# Patient Record
Sex: Female | Born: 1940
Health system: Southern US, Community
[De-identification: ages and names within clinical notes are randomized; demographics above are authoritative.]

## PROBLEM LIST (undated history)

## (undated) DIAGNOSIS — F419 Anxiety disorder, unspecified: Secondary | ICD-10-CM

## (undated) DIAGNOSIS — IMO0001 Reserved for inherently not codable concepts without codable children: Secondary | ICD-10-CM

## (undated) DIAGNOSIS — J189 Pneumonia, unspecified organism: Secondary | ICD-10-CM

## (undated) DIAGNOSIS — I10 Essential (primary) hypertension: Secondary | ICD-10-CM

## (undated) HISTORY — DX: Essential (primary) hypertension: I10

## (undated) HISTORY — DX: Anxiety disorder, unspecified: F41.9

## (undated) HISTORY — PX: TUBAL LIGATION: SHX77

---

## 1998-07-08 ENCOUNTER — Ambulatory Visit (HOSPITAL_COMMUNITY): Admission: RE | Admit: 1998-07-08 | Discharge: 1998-07-08 | Payer: Self-pay | Admitting: Internal Medicine

## 1998-07-08 ENCOUNTER — Encounter: Payer: Self-pay | Admitting: Internal Medicine

## 1998-11-23 ENCOUNTER — Other Ambulatory Visit: Admission: RE | Admit: 1998-11-23 | Discharge: 1998-11-23 | Payer: Self-pay | Admitting: Internal Medicine

## 2000-05-27 ENCOUNTER — Emergency Department (HOSPITAL_COMMUNITY): Admission: EM | Admit: 2000-05-27 | Discharge: 2000-05-27 | Payer: Self-pay | Admitting: Emergency Medicine

## 2000-06-10 ENCOUNTER — Emergency Department (HOSPITAL_COMMUNITY): Admission: EM | Admit: 2000-06-10 | Discharge: 2000-06-10 | Payer: Self-pay | Admitting: Emergency Medicine

## 2000-06-10 ENCOUNTER — Encounter: Payer: Self-pay | Admitting: Emergency Medicine

## 2001-01-16 ENCOUNTER — Encounter: Admission: RE | Admit: 2001-01-16 | Discharge: 2001-01-16 | Payer: Self-pay | Admitting: Family Medicine

## 2001-01-16 ENCOUNTER — Encounter: Payer: Self-pay | Admitting: Family Medicine

## 2001-01-20 ENCOUNTER — Other Ambulatory Visit: Admission: RE | Admit: 2001-01-20 | Discharge: 2001-01-20 | Payer: Self-pay | Admitting: Family Medicine

## 2001-05-07 ENCOUNTER — Encounter: Payer: Self-pay | Admitting: Family Medicine

## 2001-05-07 ENCOUNTER — Encounter: Admission: RE | Admit: 2001-05-07 | Discharge: 2001-05-07 | Payer: Self-pay | Admitting: *Deleted

## 2001-07-16 ENCOUNTER — Encounter: Admission: RE | Admit: 2001-07-16 | Discharge: 2001-07-16 | Payer: Self-pay | Admitting: Family Medicine

## 2001-07-16 ENCOUNTER — Encounter: Payer: Self-pay | Admitting: Family Medicine

## 2001-08-10 ENCOUNTER — Emergency Department (HOSPITAL_COMMUNITY): Admission: EM | Admit: 2001-08-10 | Discharge: 2001-08-10 | Payer: Self-pay

## 2003-01-20 ENCOUNTER — Other Ambulatory Visit: Admission: RE | Admit: 2003-01-20 | Discharge: 2003-01-20 | Payer: Self-pay | Admitting: Obstetrics and Gynecology

## 2005-02-07 ENCOUNTER — Ambulatory Visit: Payer: Self-pay | Admitting: Internal Medicine

## 2005-02-20 ENCOUNTER — Ambulatory Visit: Payer: Self-pay

## 2005-05-28 ENCOUNTER — Ambulatory Visit: Payer: Self-pay | Admitting: Internal Medicine

## 2005-09-14 ENCOUNTER — Ambulatory Visit: Payer: Self-pay | Admitting: Internal Medicine

## 2005-12-18 ENCOUNTER — Ambulatory Visit: Payer: Self-pay | Admitting: Internal Medicine

## 2006-03-05 ENCOUNTER — Ambulatory Visit: Payer: Self-pay | Admitting: Internal Medicine

## 2006-03-25 ENCOUNTER — Ambulatory Visit: Payer: Self-pay | Admitting: Internal Medicine

## 2006-04-03 ENCOUNTER — Ambulatory Visit: Payer: Self-pay | Admitting: Cardiology

## 2006-05-01 ENCOUNTER — Ambulatory Visit: Payer: Self-pay | Admitting: Internal Medicine

## 2006-05-15 ENCOUNTER — Ambulatory Visit: Payer: Self-pay | Admitting: Internal Medicine

## 2006-05-22 ENCOUNTER — Ambulatory Visit (HOSPITAL_COMMUNITY): Admission: RE | Admit: 2006-05-22 | Discharge: 2006-05-22 | Payer: Self-pay | Admitting: Internal Medicine

## 2006-05-29 ENCOUNTER — Ambulatory Visit: Payer: Self-pay | Admitting: Internal Medicine

## 2006-07-08 ENCOUNTER — Ambulatory Visit: Payer: Self-pay | Admitting: Internal Medicine

## 2006-09-16 ENCOUNTER — Ambulatory Visit: Payer: Self-pay | Admitting: Internal Medicine

## 2006-09-18 ENCOUNTER — Ambulatory Visit: Payer: Self-pay | Admitting: Internal Medicine

## 2006-10-02 ENCOUNTER — Ambulatory Visit: Payer: Self-pay | Admitting: Cardiology

## 2006-11-27 ENCOUNTER — Ambulatory Visit: Payer: Self-pay | Admitting: Internal Medicine

## 2006-11-27 DIAGNOSIS — J45909 Unspecified asthma, uncomplicated: Secondary | ICD-10-CM

## 2007-01-30 ENCOUNTER — Ambulatory Visit: Payer: Self-pay | Admitting: Internal Medicine

## 2007-01-30 DIAGNOSIS — F3289 Other specified depressive episodes: Secondary | ICD-10-CM | POA: Insufficient documentation

## 2007-01-30 DIAGNOSIS — F329 Major depressive disorder, single episode, unspecified: Secondary | ICD-10-CM

## 2007-02-04 ENCOUNTER — Encounter: Payer: Self-pay | Admitting: Internal Medicine

## 2007-02-04 DIAGNOSIS — F411 Generalized anxiety disorder: Secondary | ICD-10-CM

## 2007-02-04 DIAGNOSIS — I10 Essential (primary) hypertension: Secondary | ICD-10-CM

## 2007-03-13 ENCOUNTER — Encounter: Admission: RE | Admit: 2007-03-13 | Discharge: 2007-03-13 | Payer: Self-pay | Admitting: Obstetrics and Gynecology

## 2007-05-05 ENCOUNTER — Telehealth (INDEPENDENT_AMBULATORY_CARE_PROVIDER_SITE_OTHER): Payer: Self-pay | Admitting: *Deleted

## 2007-05-06 ENCOUNTER — Ambulatory Visit: Payer: Self-pay | Admitting: Internal Medicine

## 2007-05-07 LAB — CONVERTED CEMR LAB
Albumin: 4 g/dL (ref 3.5–5.2)
Bacteria, UA: NEGATIVE
CO2: 33 meq/L — ABNORMAL HIGH (ref 19–32)
Chloride: 95 meq/L — ABNORMAL LOW (ref 96–112)
Crystals: NEGATIVE
Eosinophils Relative: 9.1 % — ABNORMAL HIGH (ref 0.0–5.0)
GFR calc non Af Amer: 67 mL/min
MCHC: 34.2 g/dL (ref 30.0–36.0)
MCV: 89.6 fL (ref 78.0–100.0)
Monocytes Absolute: 0.5 10*3/uL (ref 0.2–0.7)
Monocytes Relative: 7.7 % (ref 3.0–11.0)
Mucus, UA: NEGATIVE
Neutrophils Relative %: 36.2 % — ABNORMAL LOW (ref 43.0–77.0)
Nitrite: NEGATIVE
RBC: 4.62 M/uL (ref 3.87–5.11)
RDW: 12.8 % (ref 11.5–14.6)
Sodium: 136 meq/L (ref 135–145)
Total Bilirubin: 0.9 mg/dL (ref 0.3–1.2)
Total Protein, Urine: NEGATIVE mg/dL
Total Protein: 8.2 g/dL (ref 6.0–8.3)
Urine Glucose: NEGATIVE mg/dL
Urobilinogen, UA: 0.2 (ref 0.0–1.0)
WBC: 6.7 10*3/uL (ref 4.5–10.5)

## 2007-05-09 ENCOUNTER — Ambulatory Visit: Payer: Self-pay | Admitting: Internal Medicine

## 2007-05-09 DIAGNOSIS — N3090 Cystitis, unspecified without hematuria: Secondary | ICD-10-CM | POA: Insufficient documentation

## 2007-09-10 ENCOUNTER — Ambulatory Visit: Payer: Self-pay | Admitting: Internal Medicine

## 2007-09-10 DIAGNOSIS — M79609 Pain in unspecified limb: Secondary | ICD-10-CM

## 2007-09-10 DIAGNOSIS — R498 Other voice and resonance disorders: Secondary | ICD-10-CM | POA: Insufficient documentation

## 2007-09-10 DIAGNOSIS — R21 Rash and other nonspecific skin eruption: Secondary | ICD-10-CM

## 2007-09-18 ENCOUNTER — Ambulatory Visit: Payer: Self-pay

## 2007-11-13 ENCOUNTER — Ambulatory Visit: Payer: Self-pay | Admitting: Internal Medicine

## 2007-11-13 DIAGNOSIS — J309 Allergic rhinitis, unspecified: Secondary | ICD-10-CM | POA: Insufficient documentation

## 2008-03-18 ENCOUNTER — Ambulatory Visit: Payer: Self-pay | Admitting: Internal Medicine

## 2008-03-19 LAB — CONVERTED CEMR LAB
Alkaline Phosphatase: 76 units/L (ref 39–117)
CO2: 30 meq/L (ref 19–32)
Calcium: 9.4 mg/dL (ref 8.4–10.5)
GFR calc Af Amer: 71 mL/min
GFR calc non Af Amer: 59 mL/min
Glucose, Bld: 124 mg/dL — ABNORMAL HIGH (ref 70–99)
HCT: 39.2 % (ref 36.0–46.0)
Hemoglobin: 13.8 g/dL (ref 12.0–15.0)
MCHC: 35.1 g/dL (ref 30.0–36.0)
MCV: 88.7 fL (ref 78.0–100.0)
Monocytes Absolute: 0.6 10*3/uL (ref 0.1–1.0)
Monocytes Relative: 8.6 % (ref 3.0–12.0)
Neutrophils Relative %: 38.2 % — ABNORMAL LOW (ref 43.0–77.0)
Platelets: 334 10*3/uL (ref 150–400)
Potassium: 4.1 meq/L (ref 3.5–5.1)
RBC: 4.42 M/uL (ref 3.87–5.11)
RDW: 12.4 % (ref 11.5–14.6)

## 2008-04-01 ENCOUNTER — Ambulatory Visit: Payer: Self-pay | Admitting: Internal Medicine

## 2008-04-01 DIAGNOSIS — H6120 Impacted cerumen, unspecified ear: Secondary | ICD-10-CM

## 2008-07-12 ENCOUNTER — Ambulatory Visit: Payer: Self-pay | Admitting: Internal Medicine

## 2008-07-12 DIAGNOSIS — J42 Unspecified chronic bronchitis: Secondary | ICD-10-CM

## 2008-10-11 ENCOUNTER — Telehealth: Payer: Self-pay | Admitting: Internal Medicine

## 2008-10-21 ENCOUNTER — Telehealth: Payer: Self-pay | Admitting: Internal Medicine

## 2008-10-26 ENCOUNTER — Telehealth: Payer: Self-pay | Admitting: Internal Medicine

## 2008-10-29 ENCOUNTER — Encounter (INDEPENDENT_AMBULATORY_CARE_PROVIDER_SITE_OTHER): Payer: Self-pay | Admitting: Internal Medicine

## 2008-10-29 ENCOUNTER — Inpatient Hospital Stay (HOSPITAL_COMMUNITY): Admission: AD | Admit: 2008-10-29 | Discharge: 2008-11-01 | Payer: Self-pay | Admitting: Internal Medicine

## 2008-10-29 ENCOUNTER — Ambulatory Visit: Payer: Self-pay | Admitting: Cardiology

## 2008-10-29 ENCOUNTER — Ambulatory Visit: Payer: Self-pay | Admitting: Internal Medicine

## 2008-10-29 DIAGNOSIS — R0989 Other specified symptoms and signs involving the circulatory and respiratory systems: Secondary | ICD-10-CM

## 2008-10-29 DIAGNOSIS — R0609 Other forms of dyspnea: Secondary | ICD-10-CM

## 2008-10-29 DIAGNOSIS — R000 Tachycardia, unspecified: Secondary | ICD-10-CM

## 2008-11-03 ENCOUNTER — Telehealth (INDEPENDENT_AMBULATORY_CARE_PROVIDER_SITE_OTHER): Payer: Self-pay | Admitting: *Deleted

## 2008-11-08 ENCOUNTER — Ambulatory Visit: Payer: Self-pay | Admitting: Internal Medicine

## 2008-11-08 LAB — CONVERTED CEMR LAB
AST: 21 units/L (ref 0–37)
Bilirubin, Direct: 0.1 mg/dL (ref 0.0–0.3)
Creatinine, Ser: 0.9 mg/dL (ref 0.4–1.2)
GFR calc non Af Amer: 80.19 mL/min (ref >60)
Glucose, Bld: 85 mg/dL (ref 70–99)
LDL Cholesterol: 97 mg/dL (ref 0–99)
Sodium: 145 meq/L (ref 135–145)
TSH: 2.65 microintl units/mL (ref 0.35–5.50)
Total CHOL/HDL Ratio: 3
Total Protein: 7.4 g/dL (ref 6.0–8.3)
VLDL: 21.6 mg/dL (ref 0.0–40.0)

## 2008-11-12 ENCOUNTER — Ambulatory Visit: Payer: Self-pay | Admitting: Internal Medicine

## 2009-01-12 ENCOUNTER — Ambulatory Visit: Payer: Self-pay | Admitting: Internal Medicine

## 2009-01-12 LAB — CONVERTED CEMR LAB
BUN: 14 mg/dL (ref 6–23)
CO2: 34 meq/L — ABNORMAL HIGH (ref 19–32)
Calcium: 9.4 mg/dL (ref 8.4–10.5)
Chloride: 103 meq/L (ref 96–112)
Glucose, Bld: 88 mg/dL (ref 70–99)
Potassium: 4.4 meq/L (ref 3.5–5.1)
Sodium: 141 meq/L (ref 135–145)

## 2009-01-19 ENCOUNTER — Ambulatory Visit: Payer: Self-pay | Admitting: Internal Medicine

## 2009-06-06 ENCOUNTER — Telehealth: Payer: Self-pay | Admitting: Internal Medicine

## 2009-07-19 ENCOUNTER — Ambulatory Visit: Payer: Self-pay | Admitting: Internal Medicine

## 2010-01-18 ENCOUNTER — Encounter: Payer: Self-pay | Admitting: Internal Medicine

## 2010-01-18 ENCOUNTER — Ambulatory Visit: Payer: Self-pay | Admitting: Internal Medicine

## 2010-01-18 DIAGNOSIS — R059 Cough, unspecified: Secondary | ICD-10-CM | POA: Insufficient documentation

## 2010-01-18 DIAGNOSIS — R05 Cough: Secondary | ICD-10-CM | POA: Insufficient documentation

## 2010-01-19 ENCOUNTER — Ambulatory Visit: Payer: Self-pay | Admitting: Internal Medicine

## 2010-01-20 LAB — CONVERTED CEMR LAB
ALT: 13 units/L (ref 0–35)
AST: 20 units/L (ref 0–37)
Alkaline Phosphatase: 73 units/L (ref 39–117)
BUN: 14 mg/dL (ref 6–23)
Basophils Relative: 0.6 % (ref 0.0–3.0)
CO2: 30 meq/L (ref 19–32)
Chloride: 100 meq/L (ref 96–112)
Direct LDL: 138.6 mg/dL
Eosinophils Absolute: 0.6 10*3/uL (ref 0.0–0.7)
Eosinophils Relative: 9.3 % — ABNORMAL HIGH (ref 0.0–5.0)
GFR calc non Af Amer: 95.66 mL/min (ref >60)
Glucose, Bld: 89 mg/dL (ref 70–99)
HCT: 41.1 % (ref 36.0–46.0)
HDL: 49 mg/dL (ref >39.00)
Hemoglobin, Urine: NEGATIVE
Monocytes Absolute: 0.6 10*3/uL (ref 0.1–1.0)
Neutrophils Relative %: 48.6 % (ref 43.0–77.0)
RDW: 13.8 % (ref 11.5–14.6)
TSH: 1.01 microintl units/mL (ref 0.35–5.50)
Total Bilirubin: 0.7 mg/dL (ref 0.3–1.2)
Urine Glucose: NEGATIVE mg/dL
VLDL: 16.6 mg/dL (ref 0.0–40.0)

## 2010-05-21 ENCOUNTER — Encounter: Payer: Self-pay | Admitting: Obstetrics and Gynecology

## 2010-05-30 NOTE — Miscellaneous (Signed)
Summary: [facility]   Imported By: Lester Bartlett 07/29/2009 07:58:22  _____________________________________________________________________  External Attachment:    Type:   Image     Comment:   External Document

## 2010-05-30 NOTE — Assessment & Plan Note (Signed)
Summary: 6 mos f/u #/cd   Vital Signs:  Patient profile:   70 year old female Height:      63 inches Weight:      144 pounds BMI:     25.60 Temp:     98.2 degrees F oral Pulse rate:   87 / minute BP sitting:   126 / 70  (left arm)  Vitals Entered By: Tora Perches (July 19, 2009 1:40 PM) CC: f/u Is Patient Diabetic? No   CC:  f/u.  History of Present Illness: The patient presents for a follow up of hypertension, asthma, hyperlipidemia. Wants to try to go off Symbicort  Preventive Screening-Counseling & Management  Alcohol-Tobacco     Smoking Status: never  Current Medications (verified): 1)  Triamcinolone Acetonide 0.5 % Crea (Triamcinolone Acetonide) .... Apply Bid To Affected Area 2)  Loratadine 10 Mg  Tabs (Loratadine) .... Once Daily As Needed Allergies 3)  Vitamin D3 1000 Unit  Tabs (Cholecalciferol) .Marland Kitchen.. 1 Qd 4)  Proair Hfa 108 (90 Base) Mcg/act  Aers (Albuterol Sulfate) .... 2 Inh Q4h As Needed Shortness of Breath 5)  Diltiazem Hcl 120 Mg Tabs (Diltiazem Hcl) .Marland Kitchen.. 1 By Mouth Bid 6)  Symbicort 160-4.5 Mcg/act Aero (Budesonide-Formoterol Fumarate) .Marland Kitchen.. 1 Inh Bid 7)  Lorazepam 1 Mg  Tabs (Lorazepam) .Marland Kitchen.. 1 By Mouth Two Times A Day As Needed Anxiety 8)  Promethazine-Codeine 6.25-10 Mg/80ml Syrp (Promethazine-Codeine) .Marland Kitchen.. 1 To 2 Tsp As Needed Cough  Allergies: 1)  ! Pcn 2)  ! Sulfa 3)  ! Clindamycin 4)  Asa 5)  Advair Diskus (Fluticasone-Salmeterol) 6)  Singulair (Montelukast Sodium)  Physical Exam  General:  Well-developed,well-nourished,in mild resp. distress; alert,appropriate and cooperative throughout examination Eyes:  No corneal or conjunctival inflammation noted. EOMI. Perrla.  Nose:  External nasal examination shows no deformity or inflammation. Nasal mucosa are pink and moist without lesions or exudates. Mouth:  Oral mucosa and oropharynx without lesions or exudates.  Teeth in good repair. Lungs:  mild wheeses Heart:  RRR 2/6 systolic heart murmur    Abdomen:  Bowel sounds positive,abdomen soft and non-tender without masses, organomegaly or hernias noted. Msk:  No deformity or scoliosis noted of thoracic or lumbar spine.   Neurologic:  No cranial nerve deficits noted. Station and gait are normal. Plantar reflexes are down-going bilaterally. DTRs are symmetrical throughout. Sensory, motor and coordinative functions appear intact. Skin:  Intact without suspicious lesions or rashes Psych:  Cognition and judgment appear intact. Alert and cooperative with normal attention span and concentration. No apparent delusions, illusions, hallucinations   Impression & Recommendations:  Problem # 1:  ASTHMA (ICD-493.90) Assessment Unchanged  Her updated medication list for this problem includes:    Proair Hfa 108 (90 Base) Mcg/act Aers (Albuterol sulfate) .Marland Kitchen... 2 inh q4h as needed shortness of breath    Symbicort 160-4.5 Mcg/act Aero (Budesonide-formoterol fumarate) .Marland Kitchen... 1 inh bid    Qvar 40 Mcg/act Aers (Beclomethasone dipropionate) .Marland Kitchen... 2 inh qd  Problem # 2:  DEPRESSION (ICD-311) Assessment: Unchanged  Her updated medication list for this problem includes:    Lorazepam 1 Mg Tabs (Lorazepam) .Marland Kitchen... 1 by mouth two times a day as needed anxiety  Problem # 3:  HOARSENESS (ICD-784.49) Assessment: Improved  Problem # 4:  ANXIETY (ICD-300.00) Assessment: Unchanged  Her updated medication list for this problem includes:    Lorazepam 1 Mg Tabs (Lorazepam) .Marland Kitchen... 1 by mouth two times a day as needed anxiety  Problem # 5:  DYSPNEA (ICD-786.09) Assessment: Improved  Her updated medication list for this problem includes:    Proair Hfa 108 (90 Base) Mcg/act Aers (Albuterol sulfate) .Marland Kitchen... 2 inh q4h as needed shortness of breath    Symbicort 160-4.5 Mcg/act Aero (Budesonide-formoterol fumarate) .Marland Kitchen... 1 inh bid    Qvar 40 Mcg/act Aers (Beclomethasone dipropionate) .Marland Kitchen... 2 inh qd  Complete Medication List: 1)  Triamcinolone Acetonide 0.5 % Crea  (Triamcinolone acetonide) .... Apply bid to affected area 2)  Loratadine 10 Mg Tabs (Loratadine) .... Once daily as needed allergies 3)  Vitamin D3 1000 Unit Tabs (Cholecalciferol) .Marland Kitchen.. 1 qd 4)  Proair Hfa 108 (90 Base) Mcg/act Aers (Albuterol sulfate) .... 2 inh q4h as needed shortness of breath 5)  Diltiazem Hcl 120 Mg Tabs (Diltiazem hcl) .Marland Kitchen.. 1 by mouth bid 6)  Symbicort 160-4.5 Mcg/act Aero (Budesonide-formoterol fumarate) .Marland Kitchen.. 1 inh bid 7)  Lorazepam 1 Mg Tabs (Lorazepam) .Marland Kitchen.. 1 by mouth two times a day as needed anxiety 8)  Promethazine-codeine 6.25-10 Mg/33ml Syrp (Promethazine-codeine) .Marland Kitchen.. 1 to 2 tsp as needed cough 9)  Qvar 40 Mcg/act Aers (Beclomethasone dipropionate) .... 2 inh qd  Patient Instructions: 1)  Try Qvar 40 2 inh once daily in place of Symbicort 2)  Please schedule a follow-up appointment in 6 months well w/labs. Prescriptions: QVAR 40 MCG/ACT AERS (BECLOMETHASONE DIPROPIONATE) 2 inh qd  #1 x 12   Entered and Authorized by:   Tresa Garter MD   Signed by:   Tresa Garter MD on 07/19/2009   Method used:   Print then Give to Patient   RxID:   [id number] LORAZEPAM 1 MG  TABS (LORAZEPAM) 1 by mouth two times a day as needed anxiety  #60 x 3   Entered and Authorized by:   Tresa Garter MD   Signed by:   Tresa Garter MD on 07/19/2009   Method used:   Print then Give to Patient   RxID:   [id number]

## 2010-05-30 NOTE — Progress Notes (Signed)
Summary: REFILL  Phone Note Call from Patient Call back at [facility] Phone [phone]   Summary of Call: Patient is requesting refill on prometh/codiene syrup for productive cough. Initial call taken by: Lamar Sprinkles, CMA,  June 06, 2009 11:03 AM  Follow-up for Phone Call        ok to ref x 1 OV if sick Follow-up by: Tresa Garter MD,  June 06, 2009 11:58 AM  Additional Follow-up for Phone Call Additional follow up Details #1::        Left vm for pt Additional Follow-up by: Lamar Sprinkles, CMA,  June 06, 2009 2:34 PM    New/Updated Medications: PROMETHAZINE-CODEINE 6.25-10 MG/5ML SYRP (PROMETHAZINE-CODEINE) 1 to 2 tsp as needed cough Prescriptions: PROMETHAZINE-CODEINE 6.25-10 MG/5ML SYRP (PROMETHAZINE-CODEINE) 1 to 2 tsp as needed cough  #300 mL x 0   Entered by:   Lamar Sprinkles, CMA   Authorized by:   Tresa Garter MD   Signed by:   Lamar Sprinkles, CMA on 06/06/2009   Method used:   Telephoned to ...       [facility] (retail)       [address]       [place], [place]  [id number]       Ph: [phone]       Fax: [phone]   RxID:   [id number]

## 2010-05-30 NOTE — Assessment & Plan Note (Signed)
Summary: 6 mos well/medicare/#/cd   Vital Signs:  Patient profile:   70 year old female Height:      63 inches Weight:      144 pounds BMI:     25.60 Temp:     97.2 degrees F oral Pulse rate:   82 / minute Pulse rhythm:   regular Resp:     16 per minute BP sitting:   130 / 80  (left arm) Cuff size:   regular  Vitals Entered By: Lanier Prude, Beverly Gust) (January 18, 2010 2:05 PM) CC: MWV Is Patient Diabetic? No   CC:  MWV.  History of Present Illness: The patient presents for a preventive health examination  Patient past medical history, social history, and family history reviewed in detail no significant changes.  Patient is physically active. Depression is negative and mood is good. Hearing is normal, and able to perform activities of daily living. Risk of falling is negligible and home safety has been reviewed and is appropriate. Patient has normal height, weight, and visual acuity. Patient has been counseled on age-appropriate routine health concerns for screening and prevention. Education, counseling done. C/o fever, cough x 2-3 wks. F/u HTN.  Preventive Screening-Counseling & Management  Alcohol-Tobacco     Smoking Status: never  Current Medications (verified): 1)  Triamcinolone Acetonide 0.5 % Crea (Triamcinolone Acetonide) .... Apply Bid To Affected Area 2)  Loratadine 10 Mg  Tabs (Loratadine) .... Once Daily As Needed Allergies 3)  Vitamin D3 1000 Unit  Tabs (Cholecalciferol) .Marland Kitchen.. 1 Qd 4)  Proair Hfa 108 (90 Base) Mcg/act  Aers (Albuterol Sulfate) .... 2 Inh Q4h As Needed Shortness of Breath 5)  Diltiazem Hcl 120 Mg Tabs (Diltiazem Hcl) .Marland Kitchen.. 1 By Mouth Bid 6)  Symbicort 160-4.5 Mcg/act Aero (Budesonide-Formoterol Fumarate) .Marland Kitchen.. 1 Inh Bid 7)  Lorazepam 1 Mg  Tabs (Lorazepam) .Marland Kitchen.. 1 By Mouth Two Times A Day As Needed Anxiety 8)  Promethazine-Codeine 6.25-10 Mg/34ml Syrp (Promethazine-Codeine) .Marland Kitchen.. 1 To 2 Tsp As Needed Cough 9)  Qvar 40 Mcg/act Aers (Beclomethasone  Dipropionate) .... 2 Inh Qd  Allergies (verified): 1)  ! Pcn 2)  ! Sulfa 3)  ! Clindamycin 4)  Asa 5)  Advair Diskus (Fluticasone-Salmeterol) 6)  Singulair (Montelukast Sodium)  Past History:  Past Medical History: Last updated: 11/13/2007 Anxiety Asthma Hypertension Dr Marcelle Overlie Colonosc. 2005 Allergic rhinitis  Family History: Last updated: 01/30/2007 Family History Diabetes 1st degree relative  Social History: Last updated: 05/09/2007 Occupation: Haematologist  Never Smoked Regular exercise-yes Married  Past Surgical History: Denies surgical history except for tubal ligation  Review of Systems  The patient denies anorexia, fever, weight loss, weight gain, vision loss, decreased hearing, hoarseness, chest pain, syncope, dyspnea on exertion, peripheral edema, prolonged cough, headaches, hemoptysis, abdominal pain, melena, hematochezia, severe indigestion/heartburn, hematuria, incontinence, genital sores, muscle weakness, suspicious skin lesions, transient blindness, difficulty walking, depression, unusual weight change, abnormal bleeding, enlarged lymph nodes, angioedema, and breast masses.         occasional asthma problems  Physical Exam  General:  Well-developed,well-nourished,in mild resp. distress; alert,appropriate and cooperative throughout examination Head:  Normocephalic and atraumatic without obvious abnormalities. No apparent alopecia or balding. Eyes:  No corneal or conjunctival inflammation noted. EOMI. Perrla.  Ears:  WNL Nose:  External nasal examination shows no deformity or inflammation. Nasal mucosa are pink and moist without lesions or exudates. Mouth:  Oral mucosa and oropharynx without lesions or exudates.  Teeth in good repair. Neck:  No deformities, masses, or tenderness  noted. Lungs:  Normal respiratory effort, chest expands symmetrically. Lungs are clear to auscultation, no crackles or wheezes. Heart:  Normal rate and regular rhythm. S1 and S2  normal without gallop, murmur, click, rub or other extra sounds. Abdomen:  Bowel sounds positive,abdomen soft and non-tender without masses, organomegaly or hernias noted. Msk:  No deformity or scoliosis noted of thoracic or lumbar spine.   Pulses:  R radial normal and L radial normal.   Extremities:  No clubbing, cyanosis, edema, or deformity noted with normal full range of motion of all joints.   Neurologic:  No cranial nerve deficits noted. Station and gait are normal. Plantar reflexes are down-going bilaterally. DTRs are symmetrical throughout. Sensory, motor and coordinative functions appear intact. Skin:  Intact without suspicious lesions or rashes Psych:  Cognition and judgment appear intact. Alert and cooperative with normal attention span and concentration. No apparent delusions, illusions, hallucinations   Impression & Recommendations:  Problem # 1:  Preventive Health Care (ICD-V70.0) Assessment [facility] Colon at [facility] is pending  Overall doing well, age appropriate education and counseling updated and referral for appropriate preventive services done unless declined, immunizations up to date or declined, diet counseling done if overweight, urged to quit smoking if smokes, most recent labs reviewed and current ordered if appropriate, ecg reviewed or declined (interpretation per ECG scanned in the EMR if done); information regarding Medicare Preventation requirements given if appropriate.  Zostavax info given  Problem # 2:  COUGH (ICD-786.2) due to #3 and 4 Assessment: Unchanged  Orders: T-2 View CXR, Same Day (71020.5TC)  Problem # 3:  BRONCHITIS, ACUTE (ICD-466.0) Assessment: New  The following medications were removed from the medication list:    Qvar 40 Mcg/act Aers (Beclomethasone dipropionate) .Marland Kitchen... 2 inh qd Her updated medication list for this problem includes:    Proair Hfa 108 (90 Base) Mcg/act Aers (Albuterol sulfate) .Marland Kitchen... 2 inh q4h as needed shortness of breath     Symbicort 160-4.5 Mcg/act Aero (Budesonide-formoterol fumarate) .Marland Kitchen... 1 inh bid    Promethazine-codeine 6.25-10 Mg/81ml Syrp (Promethazine-codeine) .Marland Kitchen... 1 to 2 tsp as needed cough    Avelox 400 Mg Tabs (Moxifloxacin hcl) .Marland Kitchen... 1 by mouth once daily x 10 d if not better with Zpac    Zithromax Z-pak 250 Mg Tabs (Azithromycin) .Marland Kitchen... As dirrected  Problem # 4:  ASTHMA (ICD-493.90)  The following medications were removed from the medication list:    Qvar 40 Mcg/act Aers (Beclomethasone dipropionate) .Marland Kitchen... 2 inh qd Her updated medication list for this problem includes:    Proair Hfa 108 (90 Base) Mcg/act Aers (Albuterol sulfate) .Marland Kitchen... 2 inh q4h as needed shortness of breath    Symbicort 160-4.5 Mcg/act Aero (Budesonide-formoterol fumarate) .Marland Kitchen... 1 inh bid  Problem # 5:  CYSTITIS (ICD-595.9) Assessment: New  Her updated medication list for this problem includes:    Avelox 400 Mg Tabs (Moxifloxacin hcl) .Marland Kitchen... 1 by mouth once daily x 10 d      Problem # 6:  HYPERTENSION (ICD-401.9) Assessment: Unchanged  Her updated medication list for this problem includes:    Diltiazem Hcl 120 Mg Tabs (Diltiazem hcl) .Marland Kitchen... 1 by mouth bid  BP today: 130/80 Prior BP: 126/70 (07/19/2009)  Labs Reviewed: K+: 4.4 (01/12/2009) Creat: : 0.9 (01/12/2009)   Chol: 187 (11/08/2008)   HDL: 68.90 (11/08/2008)   LDL: 97 (11/08/2008)   TG: 108.0 (11/08/2008)  Complete Medication List: 1)  Triamcinolone Acetonide 0.5 % Crea (Triamcinolone acetonide) .... Apply bid to affected area 2)  Loratadine 10 Mg Tabs (Loratadine) .... Once daily as needed allergies 3)  Vitamin D3 1000 Unit Tabs (Cholecalciferol) .Marland Kitchen.. 1 qd 4)  Proair Hfa 108 (90 Base) Mcg/act Aers (Albuterol sulfate) .... 2 inh q4h as needed shortness of breath 5)  Diltiazem Hcl 120 Mg Tabs (Diltiazem hcl) .Marland Kitchen.. 1 by mouth bid 6)  Symbicort 160-4.5 Mcg/act Aero (Budesonide-formoterol fumarate) .Marland Kitchen.. 1 inh bid 7)  Lorazepam 1 Mg Tabs (Lorazepam) .Marland Kitchen.. 1 by mouth  two times a day as needed anxiety 8)  Promethazine-codeine 6.25-10 Mg/68ml Syrp (Promethazine-codeine) .Marland Kitchen.. 1 to 2 tsp as needed cough 9)  Avelox 400 Mg Tabs (Moxifloxacin hcl) .Marland Kitchen.. 1 by mouth once daily x 10 d 10)  Zithromax Z-pak 250 Mg Tabs (Azithromycin) .... As dirrected 11)  Ibuprofen 600 Mg Tabs (Ibuprofen) .Marland Kitchen.. 1 by mouth bid  pc x 1 wk then as needed for  pain  Other Orders: Medicare -1st Annual Wellness Visit [phone])  Patient Instructions: 1)  This week: 2)  BMP prior to visit, ICD-9: 3)  Hepatic Panel prior to visit, ICD-9: 4)  Lipid Panel prior to visit, ICD-9: 5)  TSH prior to visit, ICD-9: 6)  CBC w/ Diff prior to visit, ICD-9: 7)  Urine-dip prior to visit, ICD-9:  v70.0 8)  Please schedule a follow-up appointment in 6 months. Prescriptions: DILTIAZEM HCL 120 MG TABS (DILTIAZEM HCL) 1 by mouth bid  #60 Tablet x 12   Entered and Authorized by:   Tresa Garter MD   Signed by:   Tresa Garter MD on 01/18/2010   Method used:   Electronically to        [facility] (retail)       [address]       [place], [place]  [id number]       Ph: [phone]       Fax: [phone]   RxID:   [id number] IBUPROFEN 600 MG TABS (IBUPROFEN) 1 by mouth bid  pc x 1 wk then as needed for  pain  #60 x 3   Entered and Authorized by:   Tresa Garter MD   Signed by:   Tresa Garter MD on 01/18/2010   Method used:   Print then Give to Patient   RxID:   [id number] ZITHROMAX Z-PAK 250 MG TABS (AZITHROMYCIN) as dirrected  #1 x 0   Entered and Authorized by:   Tresa Garter MD   Signed by:   Tresa Garter MD on 01/18/2010   Method used:   Print then Give to Patient   RxID:   [id number] PROMETHAZINE-CODEINE 6.25-10 MG/5ML SYRP (PROMETHAZINE-CODEINE) 1 to 2 tsp as needed cough  #300 mL x 0   Entered and Authorized by:   Tresa Garter MD   Signed by:   Tresa Garter MD on 01/18/2010   Method used:   Print  then Give to Patient   RxID:   [id number] AVELOX 400 MG TABS (MOXIFLOXACIN HCL) 1 by mouth once daily x 10 d  #10 x 0   Entered and Authorized by:   Tresa Garter MD   Signed by:   Tresa Garter MD on 01/18/2010   Method used:   Print then Give to Patient   RxID:   [id number]

## 2010-06-05 ENCOUNTER — Telehealth: Payer: Self-pay | Admitting: Internal Medicine

## 2010-06-07 ENCOUNTER — Ambulatory Visit (INDEPENDENT_AMBULATORY_CARE_PROVIDER_SITE_OTHER)
Admission: RE | Admit: 2010-06-07 | Discharge: 2010-06-07 | Disposition: A | Discharge status: Home / Self Care | Payer: MEDICARE | Source: Ambulatory Visit | Attending: Internal Medicine | Admitting: Internal Medicine

## 2010-06-07 ENCOUNTER — Ambulatory Visit (INDEPENDENT_AMBULATORY_CARE_PROVIDER_SITE_OTHER): Payer: MEDICARE | Admitting: Internal Medicine

## 2010-06-07 ENCOUNTER — Encounter: Payer: Self-pay | Admitting: Internal Medicine

## 2010-06-07 ENCOUNTER — Other Ambulatory Visit: Payer: Self-pay | Admitting: Internal Medicine

## 2010-06-07 DIAGNOSIS — J209 Acute bronchitis, unspecified: Secondary | ICD-10-CM

## 2010-06-07 DIAGNOSIS — R0609 Other forms of dyspnea: Secondary | ICD-10-CM

## 2010-06-07 DIAGNOSIS — I10 Essential (primary) hypertension: Secondary | ICD-10-CM

## 2010-06-07 DIAGNOSIS — R0989 Other specified symptoms and signs involving the circulatory and respiratory systems: Secondary | ICD-10-CM

## 2010-06-07 DIAGNOSIS — R05 Cough: Secondary | ICD-10-CM

## 2010-06-07 DIAGNOSIS — J45909 Unspecified asthma, uncomplicated: Secondary | ICD-10-CM

## 2010-06-15 NOTE — Assessment & Plan Note (Signed)
Summary: PER PT  CHEST WHEEZING-SOB X 1 WK  D/T  STC   Vital Signs:  Patient profile:   70 year old female Height:      63 inches Weight:      144 pounds BMI:     25.60 Temp:     97.8 degrees F oral Pulse rate:   84 / minute Pulse rhythm:   regular Resp:     16 per minute BP sitting:   130 / 78  (left arm) Cuff size:   regular  Vitals Entered By: Lanier Prude, CMA(AAMA) (June 07, 2010 11:50 AM) CC: cough X 1 week, chest pain, SOB, ear pain Is Patient Diabetic? No   CC:  cough X 1 week, chest pain, SOB, and ear pain.  History of Present Illness: The patient presents with complaints of sore throat, fever, cough, sinus congestion and drainge of 10-15 days duration. Not better with OTC meds. Chest hurts with coughing. Can't sleep due to cough. Muscle aches are present.  The mucus is colored. C/o asthma  symptoms   Current Medications (verified): 1)  Triamcinolone Acetonide 0.5 % Crea (Triamcinolone Acetonide) .... Apply Bid To Affected Area 2)  Loratadine 10 Mg  Tabs (Loratadine) .... Once Daily As Needed Allergies 3)  Vitamin D3 1000 Unit  Tabs (Cholecalciferol) .Marland Kitchen.. 1 Qd 4)  Proair Hfa 108 (90 Base) Mcg/act  Aers (Albuterol Sulfate) .... 2 Inh Q4h As Needed Shortness of Breath 5)  Diltiazem Hcl 120 Mg Tabs (Diltiazem Hcl) .Marland Kitchen.. 1 By Mouth Bid 6)  Symbicort 160-4.5 Mcg/act Aero (Budesonide-Formoterol Fumarate) .Marland Kitchen.. 1 Inh Bid 7)  Lorazepam 1 Mg  Tabs (Lorazepam) .Marland Kitchen.. 1 By Mouth Two Times A Day As Needed Anxiety 8)  Promethazine-Codeine 6.25-10 Mg/60ml Syrp (Promethazine-Codeine) .Marland Kitchen.. 1 To 2 Tsp As Needed Cough 9)  Ibuprofen 600 Mg Tabs (Ibuprofen) .Marland Kitchen.. 1 By Mouth Bid  Pc X 1 Wk Then As Needed For  Pain  Allergies (verified): 1)  ! Pcn 2)  ! Sulfa 3)  ! Clindamycin 4)  Asa 5)  Advair Diskus (Fluticasone-Salmeterol) 6)  Singulair (Montelukast Sodium)  Past History:  Past Medical History: Last updated: 11/13/2007 Anxiety Asthma Hypertension Dr Marcelle Overlie Colonosc.  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 distress; alert,appropriate and cooperative throughout examination Mouth:  Erythematous throat and intranasal mucosa c/w URI  Neck:  No deformities, masses, or tenderness noted. Lungs:  L lung crackles at base Heart:  Normal rate and regular rhythm. S1 and S2 normal without gallop, murmur, click, rub or other extra sounds. Abdomen:  Bowel sounds positive,abdomen soft and non-tender without masses, organomegaly or hernias noted. Msk:  No deformity or scoliosis noted of thoracic or lumbar spine.   Neurologic:  No cranial nerve deficits noted. Station and gait are normal. Plantar reflexes are down-going bilaterally. DTRs are symmetrical throughout. Sensory, motor and coordinative functions appear intact. Skin:  Intact without suspicious lesions or rashes Psych:  Cognition and judgment appear intact. Alert and cooperative with normal attention span and concentration. No apparent delusions, illusions, hallucinations   Impression & Recommendations:  Problem # 1:  BRONCHITIS, ACUTE (ICD-466.0) - clincally - possible L bronchial pneumonia Assessment New  Her updated medication list for this problem includes:    Proair Hfa 108 (90 Base) Mcg/act Aers (Albuterol sulfate) .Marland Kitchen... 2 inh q4h as needed shortness of breath    Symbicort 160-4.5 Mcg/act Aero (  Budesonide-formoterol fumarate) .Marland Kitchen... 1 inh bid    Promethazine-codeine 6.25-10 Mg/65ml Syrp (Promethazine-codeine) .Marland Kitchen... 1 to 2 tsp as needed cough    Zithromax Z-pak 250 Mg Tabs (Azithromycin) .Marland Kitchen... As dirrected    Ciprofloxacin Hcl 500 Mg Tabs (Ciprofloxacin hcl) .Marland Kitchen... 1 by mouth bid  Problem # 2:  COUGH (ICD-786.2) Assessment: New CXR ordered and reviewed  Problem # 3:  ASTHMA (ICD-493.90) Assessment: Deteriorated  Her updated  medication list for this problem includes:    Proair Hfa 108 (90 Base) Mcg/act Aers (Albuterol sulfate) .Marland Kitchen... 2 inh q4h as needed shortness of breath    Symbicort 160-4.5 Mcg/act Aero (Budesonide-formoterol fumarate) .Marland Kitchen... 1 inh two times a day --- to try  Problem # 4:  HYPERTENSION (ICD-401.9) Assessment: Unchanged  Her updated medication list for this problem includes:    Diltiazem Hcl 120 Mg Tabs (Diltiazem hcl) .Marland Kitchen... 1 by mouth bid  BP today: 130/78 Prior BP: 130/80 (01/18/2010)  Labs Reviewed: K+: 4.8 (01/19/2010) Creat: : 0.8 (01/19/2010)   Chol: 205 (01/19/2010)   HDL: 49.00 (01/19/2010)   LDL: 97 (11/08/2008)   TG: 83.0 (01/19/2010)  Complete Medication List: 1)  Triamcinolone Acetonide 0.5 % Crea (Triamcinolone acetonide) .... Apply bid to affected area 2)  Loratadine 10 Mg Tabs (Loratadine) .... Once daily as needed allergies 3)  Vitamin D3 1000 Unit Tabs (Cholecalciferol) .Marland Kitchen.. 1 qd 4)  Proair Hfa 108 (90 Base) Mcg/act Aers (Albuterol sulfate) .... 2 inh q4h as needed shortness of breath 5)  Diltiazem Hcl 120 Mg Tabs (Diltiazem hcl) .Marland Kitchen.. 1 by mouth bid 6)  Symbicort 160-4.5 Mcg/act Aero (Budesonide-formoterol fumarate) .Marland Kitchen.. 1 inh bid 7)  Lorazepam 1 Mg Tabs (Lorazepam) .Marland Kitchen.. 1 by mouth two times a day as needed anxiety 8)  Promethazine-codeine 6.25-10 Mg/24ml Syrp (Promethazine-codeine) .Marland Kitchen.. 1 to 2 tsp as needed cough 9)  Ibuprofen 600 Mg Tabs (Ibuprofen) .Marland Kitchen.. 1 by mouth bid  pc x 1 wk then as needed for  pain 10)  Zithromax Z-pak 250 Mg Tabs (Azithromycin) .... As dirrected 11)  Ciprofloxacin Hcl 500 Mg Tabs (Ciprofloxacin hcl) .Marland Kitchen.. 1 by mouth bid  Other Orders: T-2 View CXR (71020TC)  Patient Instructions: 1)  Use over-the-counter medicines for "cold": Tylenol  650mg  or Advil 400mg  every 6 hours  for fever; Delsym or Robutussin for cough. Mucinex or Mucinex D for congestion. Ricola or Halls for sore throat. Office visit if not better or if worse.   Prescriptions: PROMETHAZINE-CODEINE 6.25-10 MG/5ML SYRP (PROMETHAZINE-CODEINE) 1 to 2 tsp as needed cough  #300 mL x 0   Entered and Authorized by:   Tresa Garter MD   Signed by:   Tresa Garter MD on 06/07/2010   Method used:   Print then Give to Patient   RxID:   [id number] CIPROFLOXACIN HCL 500 MG TABS (CIPROFLOXACIN HCL) 1 by mouth bid  #20 x 1   Entered and Authorized by:   Tresa Garter MD   Signed by:   Tresa Garter MD on 06/07/2010   Method used:   Print then Give to Patient   RxID:   [id number] ZITHROMAX Z-PAK 250 MG TABS (AZITHROMYCIN) as dirrected  #1 x 0   Entered and Authorized by:   Tresa Garter MD   Signed by:   Tresa Garter MD on 06/07/2010   Method used:   Print then Give to Patient   RxID:   [id number]    Orders Added: 1)  T-2 View CXR [71020TC] 2)  Est.  Patient Level IV [04540]

## 2010-06-15 NOTE — Progress Notes (Signed)
  Phone Note Call from Patient   Summary of Call: Pt called c/o wheezing & SOB. Will only see Dr Macario Golds - scheduled for Wed. Advised she call office w/any problems. Sent in refill of proair.  Initial call taken by: Lamar Sprinkles, CMA,  June 05, 2010 4:52 PM

## 2010-07-19 ENCOUNTER — Ambulatory Visit: Payer: Self-pay | Admitting: Internal Medicine

## 2010-07-19 DIAGNOSIS — Z0289 Encounter for other administrative examinations: Secondary | ICD-10-CM

## 2010-07-28 ENCOUNTER — Other Ambulatory Visit: Payer: Self-pay | Admitting: Internal Medicine

## 2010-07-28 NOTE — Telephone Encounter (Signed)
Ok for refill? 

## 2010-08-06 LAB — HEMOGLOBIN A1C
Hgb A1c MFr Bld: 6.2 % — ABNORMAL HIGH (ref 4.6–6.1)
Mean Plasma Glucose: 131 mg/dL

## 2010-08-06 LAB — COMPREHENSIVE METABOLIC PANEL
Albumin: 4 g/dL (ref 3.5–5.2)
Alkaline Phosphatase: 79 U/L (ref 39–117)
BUN: 12 mg/dL (ref 6–23)
CO2: 26 mEq/L (ref 19–32)
GFR calc Af Amer: >60 mL/min (ref >60)
GFR calc non Af Amer: 59 mL/min — ABNORMAL LOW (ref >60)
Glucose, Bld: 117 mg/dL — ABNORMAL HIGH (ref 70–99)
Potassium: 3.9 mEq/L (ref 3.5–5.1)
Total Protein: 8.4 g/dL — ABNORMAL HIGH (ref 6.0–8.3)

## 2010-08-06 LAB — MAGNESIUM: Magnesium: 2.4 mg/dL (ref 1.5–2.5)

## 2010-08-06 LAB — CBC
MCV: 89.5 fL (ref 78.0–100.0)
Platelets: 367 10*3/uL (ref 150–400)
RBC: 4.93 MIL/uL (ref 3.87–5.11)
RBC: 5.59 MIL/uL — ABNORMAL HIGH (ref 3.87–5.11)
WBC: 6.1 10*3/uL (ref 4.0–10.5)
WBC: 7.8 10*3/uL (ref 4.0–10.5)

## 2010-08-06 LAB — CREATININE, SERUM
Creatinine, Ser: 1.18 mg/dL (ref 0.4–1.2)
GFR calc Af Amer: 55 mL/min — ABNORMAL LOW (ref >60)
GFR calc non Af Amer: 46 mL/min — ABNORMAL LOW (ref >60)

## 2010-08-06 LAB — CARDIAC PANEL(CRET KIN+CKTOT+MB+TROPI)
CK, MB: 2.1 ng/mL (ref 0.3–4.0)
CK, MB: 2.6 ng/mL (ref 0.3–4.0)
Relative Index: 2.4 (ref 0.0–2.5)
Relative Index: INVALID (ref 0.0–2.5)
Total CK: 107 U/L (ref 7–177)
Total CK: 133 U/L (ref 7–177)

## 2010-08-06 LAB — BRAIN NATRIURETIC PEPTIDE: Pro B Natriuretic peptide (BNP): 148 pg/mL — ABNORMAL HIGH (ref 0.0–100.0)

## 2010-08-06 LAB — LIPID PANEL
Cholesterol: 209 mg/dL — ABNORMAL HIGH (ref 0–200)
Total CHOL/HDL Ratio: 3.7 RATIO
VLDL: 13 mg/dL (ref 0–40)

## 2010-08-07 NOTE — Telephone Encounter (Signed)
OK to fill this prescription with additional refills x0 Thank you!  

## 2010-08-16 ENCOUNTER — Encounter: Payer: Self-pay | Admitting: Internal Medicine

## 2010-08-16 ENCOUNTER — Ambulatory Visit (INDEPENDENT_AMBULATORY_CARE_PROVIDER_SITE_OTHER): Payer: MEDICARE | Admitting: Internal Medicine

## 2010-08-16 DIAGNOSIS — R0609 Other forms of dyspnea: Secondary | ICD-10-CM

## 2010-08-16 DIAGNOSIS — J45909 Unspecified asthma, uncomplicated: Secondary | ICD-10-CM

## 2010-08-16 DIAGNOSIS — R05 Cough: Secondary | ICD-10-CM

## 2010-08-16 DIAGNOSIS — R0989 Other specified symptoms and signs involving the circulatory and respiratory systems: Secondary | ICD-10-CM

## 2010-08-16 DIAGNOSIS — I10 Essential (primary) hypertension: Secondary | ICD-10-CM

## 2010-08-16 DIAGNOSIS — J309 Allergic rhinitis, unspecified: Secondary | ICD-10-CM

## 2010-08-16 MED ORDER — LORATADINE 10 MG PO TABS: 10.0000 mg | ORAL_TABLET | Freq: Every day | ORAL | Status: DC

## 2010-08-16 MED ORDER — PROMETHAZINE-CODEINE 6.25-10 MG/5ML PO SYRP: 5.0000 mL | ORAL_SOLUTION | Freq: Four times a day (QID) | ORAL | Status: DC | PRN

## 2010-08-16 MED ORDER — AZITHROMYCIN 100 MG/5ML PO SUSR: ORAL | Status: AC

## 2010-08-16 NOTE — Progress Notes (Signed)
  Subjective:    Patient ID: [name], female    DOB: 1940-10-12, 70 y.o.   MRN: [id number]  HPI  The patient presents for a follow-up of  chronic hypertension, asthma and allergies controlled with medicines. C/o more SOB and cough w/thick sputum.     Review of Systems  Constitutional: Negative for chills, appetite change, fatigue and unexpected weight change.  HENT: Negative for neck pain.   Eyes: Positive for itching.  Respiratory: Positive for cough, chest tightness and shortness of breath. Negative for choking, wheezing and stridor.   Gastrointestinal: Negative for abdominal distention.  Genitourinary: Negative for dysuria and decreased urine volume.  Musculoskeletal: Negative for joint swelling and arthralgias.  Neurological: Negative for dizziness.  Psychiatric/Behavioral: Negative for sleep disturbance, decreased concentration and agitation. The patient is nervous/anxious.        Objective:   Physical Exam  Constitutional: She appears well-developed and well-nourished. No distress.  HENT:  Head: Normocephalic.  Right Ear: External ear normal.  Left Ear: External ear normal.  Nose: Nose normal.  Mouth/Throat: Oropharynx is clear and moist.  Eyes: Conjunctivae are normal. Pupils are equal, round, and reactive to light. Right eye exhibits no discharge. Left eye exhibits no discharge.  Neck: Normal range of motion. Neck supple. No JVD present. No tracheal deviation present. No thyromegaly present.  Cardiovascular: Normal rate, regular rhythm and normal heart sounds.   Pulmonary/Chest: No stridor. No respiratory distress. She has no wheezes.  Abdominal: Soft. Bowel sounds are normal. She exhibits no distension and no mass. There is no tenderness. There is no rebound and no guarding.  Musculoskeletal: She exhibits no edema and no tenderness.  Lymphadenopathy:    She has no cervical adenopathy.  Neurological: She displays normal reflexes. No cranial nerve deficit. She  exhibits normal muscle tone. Coordination normal.  Skin: No rash noted. No erythema.  Psychiatric: She has a normal mood and affect. Her behavior is normal. Judgment and thought content normal.          Assessment & Plan:   HYPERTENSION On Rx  ASTHMA Cont Rx  ALLERGIC RHINITIS Start Claritin  COUGH Likely bronchitis. Given an abx.  DYSPNEA Due to asthma.

## 2010-08-17 NOTE — Assessment & Plan Note (Signed)
Likely bronchitis. Given an abx.

## 2010-08-17 NOTE — Assessment & Plan Note (Signed)
On Rx 

## 2010-08-17 NOTE — Assessment & Plan Note (Signed)
Due to asthma.

## 2010-08-17 NOTE — Assessment & Plan Note (Signed)
Cont Rx 

## 2010-08-17 NOTE — Assessment & Plan Note (Signed)
Start Claritin 

## 2010-08-18 ENCOUNTER — Encounter: Payer: Self-pay | Admitting: Internal Medicine

## 2010-08-18 ENCOUNTER — Ambulatory Visit (INDEPENDENT_AMBULATORY_CARE_PROVIDER_SITE_OTHER): Payer: MEDICARE | Admitting: Internal Medicine

## 2010-08-18 ENCOUNTER — Other Ambulatory Visit: Payer: MEDICARE

## 2010-08-18 VITALS — BP 156/60 | HR 98 | Ht 63.0 in | Wt 143.0 lb

## 2010-08-18 DIAGNOSIS — J309 Allergic rhinitis, unspecified: Secondary | ICD-10-CM

## 2010-08-18 DIAGNOSIS — R05 Cough: Secondary | ICD-10-CM

## 2010-08-18 DIAGNOSIS — J45909 Unspecified asthma, uncomplicated: Secondary | ICD-10-CM

## 2010-08-18 NOTE — Progress Notes (Signed)
  Subjective:    Patient ID: [name], female    DOB: 1940-11-27, 70 y.o.   MRN: [id number]  HPI 64 yoF never smoker with hx of asthma. Last here in 2008 after hospital stay, when she saw Dr Sherene Sires noting issues of hypertension and asthma meds. Notes reviewed. Seen now on referral by Dr Posey Rea after recent flare with shortness of breath, productive cough. She tends to feel better as long as she is on an antibiotic or steroids. Within a couple of weeks off, she will start to cough and get tight again. Had bronchitis as a child. In the last 5-6 years wheezing dyspnea has been more perennial. Occasionally wakes with cough. Sputum often yellow. Has had pneumonia x 2 with pneumovax. Little GERD or acid indigestion recognized. Triggers have included strong odors, seasonal pollens, chest colds, but not house dust. Had a sinus infection during the winter. Has been treated for heart rhythm and BP, but denies MI/ angina. She is using Symbicort 160 now and considers it sufficient used twice every day. She did not fill a script for a Proair rescue inhaler. 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   Review of Systems See HPI Constitutional:   No weight loss, night sweats,  Fevers, chills, fatigue, lassitude. HEENT:   No headaches,  Difficulty swallowing,  Tooth/dental problems,  Sore throat,                No sneezing, itching, ear ache,post nasal drip,   CV:  No chest pain,  Orthopnea, PND, swelling in lower extremities, anasarca, dizziness.     Occasional palpitation  GI  No heartburn, indigestion, abdominal pain, nausea, vomiting, diarrhea, change in bowel habits, loss of appetite  Resp: No excess mucus, ,  No coughing up of blood.  No wheezing.   Skin: no rash or lesions.  GU: no dysuria, change in color of urine, no urgency or frequency.  No flank pain.  MS:  No joint pain or swelling.  No decreased range of motion.  No back pain.  Psych:  No change in mood or affect. No depression or anxiety.  No  memory loss.      Objective:   Physical Exam General- Alert, Oriented, Affect-appropriate, Distress- none acute   wdwn  Skin- rash-none, lesions- none, excoriation- none  Lymphadenopathy- none  Head- atraumatic  Eyes- Gross vision intact, PERRLA, conjunctivae clear, secretions  Ears- Hearing, canals, Tm L ,   R ,  Nose- Clear, Septal dev, mucus, polyps, erosion, perforation   Throat- Mallampati II , mucosa clear , drainage- none, tonsils- atrophic  Neck- flexible , trachea midline, no stridor , thyroid nl, carotid no bruit  Chest - symmetrical excursion , unlabored     Heart/CV- RRR , no murmur , no gallop  , no rub, nl s1 s2                     - JVD- none , edema- none, stasis changes- none, varices- none     Lung- Coarse bilateral wheeze/ rhonchi.      dullness-none, rub- none     Chest wall- Abd- tender-no, distended-no, bowel sounds-present, HSM- no  Br/ Gen/ Rectal- Not done, not indicated  Extrem- cyanosis- none, clubbing, none, atrophy- none, strength- nl  Neuro- grossly intact to observation         Assessment & Plan:

## 2010-08-18 NOTE — Patient Instructions (Signed)
Sample Dulera 200-5-  Use this instead of Symbicort for trial       2 puffs and rinse mouth, twice daily  Lab- Allergy profile   Schedule PFT   Return as scheduled for allergy skin testing. For 3 days before hand, it is important that you do not take any antihistamine, including claritin, cough syrups, or over the counter sleep medicines, cough or cold products.

## 2010-08-18 NOTE — Assessment & Plan Note (Addendum)
Chronic asthmatic bronchitis. We will try to define the proces better and see waht can  Be done to modify it. She sonds pretty congested today for what she says is a "good day"  We will change Symbicort to [facility] 200, , lab for allergy profile IgE evaluation and return for skin tests and P FT.

## 2010-08-20 ENCOUNTER — Encounter: Payer: Self-pay | Admitting: Internal Medicine

## 2010-08-20 NOTE — Assessment & Plan Note (Signed)
We need to understand her lung disease better, especially any reversibility. At this point I favor chronic bronchitis/ bronchiectasis as more accurate description than asthma. We discussed this, and the role of rescue and maintenance inhaled meds. We will evaluate IgE status and get PFT.

## 2010-08-21 LAB — ALLERGY FULL PROFILE
Allergen,Goose feathers, e70: <0.35 kU/L (ref <0.35)
Alternaria Alternata: <0.35 kU/L (ref <0.35)
Bermuda Grass: <0.35 kU/L (ref <0.35)
Box Elder IgE: <0.35 kU/L (ref <0.35)
Curvularia lunata: <0.35 kU/L (ref <0.35)
Dog Dander: <0.35 kU/L (ref <0.35)
Fescue: <0.35 kU/L (ref <0.35)
G005 Rye, Perennial: <0.35 kU/L (ref <0.35)
Goldenrod: <0.35 kU/L (ref <0.35)
Helminthosporium halodes: <0.35 kU/L (ref <0.35)
IgE (Immunoglobulin E), Serum: 70.9 IU/mL (ref 0.0–180.0)
Lamb's Quarters: <0.35 kU/L (ref <0.35)
Stemphylium Botryosum: <0.35 kU/L (ref <0.35)

## 2010-08-29 ENCOUNTER — Telehealth: Payer: Self-pay | Admitting: Internal Medicine

## 2010-08-29 NOTE — Telephone Encounter (Signed)
Pt states she has been having her tongue swell since starting taking the dulera. Pt was given this 08/18/10. Pt states last night she woke up and he tongue felt heavy and as very swollen so pt took benadryl and 1/2 tablet of tylenol and that helped w/ the swelling. Pt states today her tongue feels a little heavy but doesn't seem to be swollen. I advised pt to go to the ED to be evaluated but she states she was " okay" now. Pt wants to know if she should have an epi pen on hand and if she should restart her symbicort? Please advise Dr. Maple Hudson. Thanks  Allergies  Allergen Reactions  . Advair Hfa     REACTION: hoarseness  . Aspirin   . Clindamycin     REACTION: Neck, tongue swelling, SOB \\T \ rash  . Montelukast Sodium     REACTION: hallucination  . Penicillins   . Sulfonamide Derivatives     Carver Fila, CMA

## 2010-08-29 NOTE — Telephone Encounter (Signed)
I advised pt to hang up and dial 911 and go to the hospital pt stated that she had taken [facility] and it had gone down she could see her throat now stated this happened last night. Wanted to know if she should stop taking Dulera and I said yes. Pt can be reached at [phone].[name]

## 2010-08-29 NOTE — Telephone Encounter (Signed)
Per CDY-probably thrush suggest she go back to Symbicort and see if this clears up.[name]

## 2010-08-29 NOTE — Telephone Encounter (Signed)
Spoke w/ pt and she is aware of cdy recs. Pt states she will try the symbicort and see what happens. Pt is aware to call back if she has this episode happen again or call 911. Pt verbalized understanding

## 2010-09-06 ENCOUNTER — Ambulatory Visit (INDEPENDENT_AMBULATORY_CARE_PROVIDER_SITE_OTHER): Payer: MEDICARE | Admitting: Internal Medicine

## 2010-09-06 DIAGNOSIS — R0989 Other specified symptoms and signs involving the circulatory and respiratory systems: Secondary | ICD-10-CM

## 2010-09-06 DIAGNOSIS — R05 Cough: Secondary | ICD-10-CM

## 2010-09-06 DIAGNOSIS — J45909 Unspecified asthma, uncomplicated: Secondary | ICD-10-CM

## 2010-09-06 DIAGNOSIS — R0609 Other forms of dyspnea: Secondary | ICD-10-CM

## 2010-09-06 DIAGNOSIS — R059 Cough, unspecified: Secondary | ICD-10-CM

## 2010-09-06 LAB — PULMONARY FUNCTION TEST

## 2010-09-06 NOTE — Progress Notes (Signed)
PFT done today. 

## 2010-09-12 NOTE — Discharge Summary (Signed)
[name], [name]                  ACCOUNT NO.:  [id number]   MEDICAL RECORD NO.:  [id number]          PATIENT TYPE:  INP   LOCATION:  4733                         FACILITY:  [facility]   PHYSICIAN:  Titus Dubin. Hopper, MD,FACP,FCCPDATE OF BIRTH:  09-29-40   DATE OF ADMISSION:  10/29/2008  DATE OF DISCHARGE:  11/01/2008                               DISCHARGE SUMMARY   ADMITTING DIAGNOSES:  1. Tachycardia, possible atrial flutter.  2. Dyspnea due to tachycardia.  3. Hypertension.  4. Asthma.  5. Anxiety.   DISCHARGE DIAGNOSES:  1. Tachycardia, possible atrial flutter.  2. Dyspnea due to tachycardia.  3. Hypertension.  4. Asthma.  5. Anxiety.   The tachyarrhythmia was supraventricular tachycardia responsive to  parenteral Cardizem and oral Cardizem.  This was felt to be multifocal  atrial tachycardia precipitated by or aggravated by pulmonary disease.  She was also felt to have asthma exacerbation with asthmatic bronchitis.   For details of history and physical, please see the office note of October 29, 2008.   HOSPITAL COURSE:  She was admitted for progressive palpitations over a 6-  week period, worse with exertion and associated with dyspnea.  The  patient was placed on telemetry; IV Cardizem was initiated with  improvement.   Cardiac:  Serial cardiac enzymes were negative for injury.  Comprehensive metabolic profile revealed a glucose of 117.  Her LDL was  139 with an HDL of 57.  She has no history of coronary artery disease.  An A1c was 6.2%.   CBC and differential revealed a normal white count with hematocrit 49.9.  TSH was therapeutic at 1.25.  BNP was only mildly elevated at 148 and  107 on repeat.  Serum magnesium was normal at 2.4.  BUN and creatinine  were normal.   Chest x-ray showed mild enlargement of the heart with interstitial  changes of COPD.  There was a medial left lung base opacity which was  thought to represent possible pneumonia.  She was treated  with  parenteral antibiotics and subsequently switched to Avelox.  She  remained afebrile.  A followup chest x-ray was compared with the October 29, 2008 film and with the CAT scan of October 02, 2006.  This revealed stable  opacity of the left lung base  consistent with scarring.  Slight  hyperaeration was also noted.   She was switched from Advair to Symbicort in the hospital.  Prior to  admission, she had been unable to use the Symbicort because of  coordination issues.  A spacer was employed and she had good response  with this medication with no exacerbation of her tachyarrhythmias.   She was seen in consultation by Cardiology.  She converted to normal  sinus rhythm with a calcium channel blocker.  A 2-D echocardiogram  revealed an ejection fraction of 55-60%.   Despite the mild hyperglycemia and the A1C of 6.2, a short course of  oral steroids was initiated for optimal control of reactive airways  disease.   There was significant improvement in her symptomatology with marked  decrease in the asthmatic  component.   The hospital course was complicated by first degree AV block with a  heart rate of 71 on the evening prior to discharge.  The patient was  asymptomatic.   On the morning of discharge, she was afebrile and subjectively much  better in reference to her reactive airways disease.  Her blood pressure  was 114/69, temperature 97.7, pulse 79 and regular, and respiratory rate  18.  O2 sats were 92-95% on room air.  Chest was markedly clear with  decreased rhonchi and wheezing.  She had no peripheral edema.   White count was 7800.   Prior to admission, she was on Norvasc 5 mg daily.  This was changed to  diltiazem 120 mg b.i.d.  She was to continue her loratadine 10 mg daily.  Her Advair 250/50 was held.  She was sent home on Symbicort 160/4.5 two  puffs every 12 hours ,employing the spacer.  Once she is stable, re-  initiation of Advair can be done as maintenance.   Additionally, she was  discharged on Singulair 10 mg daily and prednisone 20 mg one-half 3  times a day with meals (#9).  Diltiazem dose  was 120 mg b.i.d.  She was  discharged on Avelox 400 mg daily x5 days.  Additionally, lorazepam 0.5  mg every 8 hours as needed for anxiety was prescribed.   She has been asked to see Dr. Posey Rea in 7-10 days.  A statin appears  to be indicated.  This was not started at discharge because she was  placed on six new medications.  Once these can be weaned or decreased,  it will be appropriate to institute the statin.  We will recommend a  fasting NMR LipoProfile to optimally assess her risk, although with  suggestion of early diabetes the LDL target should be less than 100.  It  was recommend that she avoid excess sweets in her diet and also all  foods with high fructose corn syrup as number 1, 2, or 3 on the label.   Her discharge status is improved.  Prognosis depends on control of her  reactive airways disease , lipids and glucoses.      Titus Dubin. Alwyn Ren, MD,FACP,FCCP  Electronically Signed     Titus Dubin. Alwyn Ren, MD,FACP,FCCP  Electronically Signed    [facility]/MEDQ  D:  11/01/2008  T:  11/01/2008  Job:  [id number]   cc:   Everardo Beals. Juanda Chance, MD, [facility]  Georgina Quint. Plotnikov, MD

## 2010-09-12 NOTE — Assessment & Plan Note (Signed)
[facility]                             PULMONARY OFFICE NOTE   [name], [name]                         MRN:          [id number]  DATE:09/18/2006                            DOB:          04-Dec-1940    PULMONARY/ACUTE OFFICE EVALUATION:   HISTORY:  A 70 year old black female started on Advair for chronic  asthma on May 19 with a blood pressure then of 160/104.  She had been on  Norvasc previously but for some reason was not taking it.  I advised her  to reduce salt and consider restarting the Norvasc.  She states she  started checking her blood pressure and noted that it was consistently  in the 95-100 range and was concerned the Advair was the cause.  However, she is breathing fine with no chest pain, fevers, chills,  sweats, orthopnea, PND or leg swelling.   PHYSICAL EXAMINATION:  GENERAL:  She is an ambulatory black female in no  acute distress.  VITAL SIGNS:  Stable vital signs with a blood pressure of 136/90.  HEENT:  Unremarkable.  NECK:  Clear.  CHEST:  Lung fields clear bilaterally to auscultation and percussion.  CARDIAC:  Regular rate and rhythm without murmur, gallop, or rub.  ABDOMEN:  Soft, benign.  EXTREMITIES:  Warm without calf tenderness, cyanosis, clubbing or edema.   Hemoglobin saturation 96% on room air.   IMPRESSION:  Hypertension.  It has been a chronic problem, and obviously  that is the reason she was on Norvasc in the first place.  I do not have  Norvasc samples and she does not know if she still has the prescription  at home (on further reflection) and therefore I recommended samples of  Sular 17 mg one daily until she has a chance to see Dr. Posey Rea (2  weeks of samples given).     [name]. Anna Sires, MD, [facility]  Electronically Signed    MBW/MedQ  DD: 09/19/2006  DT: 09/19/2006  Job #: [id number]

## 2010-09-12 NOTE — Assessment & Plan Note (Signed)
[facility]                             PULMONARY OFFICE NOTE   DANYELL, SHADER                         MRN:          [id number]  DATE:09/16/2006                            DOB:          Jul 03, 1940    A 70 year old, black female, never smoker who carries the diagnosis of  asthma and comes back today for evaluation of a left lower lobe nodule  which was found to be cold by PET scanning in January 2008. A followup  CT scan of the chest is scheduled for January. In the meantime, she says  she is having increasing dyspnea with congested cough productive of  white sputum especially when she lies down at night. She says she no  longer has any form of rescue therapy but does not think the Symbicort  is helping her. She denies any pleuritic pain, fever, chills, sweats,  orthopnea, PND, or leg swelling.  She denies any purulent sputum or obvious sore throat, reflux symptoms  or chest pain.   PHYSICAL EXAMINATION:  GENERAL:  She is an ambulatory, black female in  no acute distress.  VITAL SIGNS:  Afebrile, normal vital signs. Blood pressure 160/104.  HEENT:  Unremarkable. Pharynx clear.  LUNGS:  Lung fields reveal sinus rhonchi bilaterally.  HEART:  There is a regular rate and rhythm without murmur, gallop or  rub.  ABDOMEN:  Soft and benign.  EXTREMITIES:  Warm without calf tenderness, cyanosis, clubbing or edema.   IMPRESSION:  1. Asthma exacerbation. I found out today that she really cannot use      metered dose inhalers effectively despite my best efforts to try to      coach her on how to use them. I recommended switching over to      Advair 250/50 b.i.d. and using albuterol HFA p.r.n. (Even this      minor modification required extra time for teaching purposes today      and I am not sure she really got the message on the difference      between maintenance and p.r.n. and optimal DPI versus HFA      technique, although I did the best I could).  2. Pulmonary nodule needs followup. A CT scan has been scheduled for      June and I will contact her once this is back.  3. Hypertension. She has previously been on Norvasc and for some      reason stopped it. She will need to avoid salt and check back with      Dr. Jonny Ruiz after her present exacerbation is treated for      consideration for any reinitiation of therapy.   I will see her back in 6 weeks regardless to followup the symptoms of  coughing and dyspnea consistent with poorly controlled asthma and I have  asked her to contact me in the meantime if her condition deteriorates in  any way.     Charlaine Dalton. Sherene Sires, MD, [facility]  Electronically Signed    MBW/MedQ  DD: 09/16/2006  DT: 09/16/2006  Job #: [id number]

## 2010-09-12 NOTE — Consult Note (Signed)
NAMEDEAJA, RIZO                  ACCOUNT NO.:  [id number]   MEDICAL RECORD NO.:  [id number]          PATIENT TYPE:  INP   LOCATION:  4733                         FACILITY:  [facility]   PHYSICIAN:  Everardo Beals. Juanda Chance, MD, FACCDATE OF BIRTH:  1940-06-21   DATE OF CONSULTATION:  DATE OF DISCHARGE:                                 CONSULTATION   PRIMARY CARE PHYSICIAN:  Georgina Quint. Plotnikov, MD   PRIMARY CARDIOLOGIST:  Everardo Beals. Juanda Chance, MD, [facility]   CHIEF COMPLAINT:  Tachyarrhythmia.   HISTORY OF PRESENT ILLNESS:  Ms. Botsford is a 70 year old female with no  history of coronary artery disease.  She reports approximately 6-week  history of palpitations saying that her heart races when she exerts  himself.  She first started noticing increased dyspnea on exertion and  tachy palpitations when she was walking.  She then noticed if she  increased her exertion level by going upstairs, or up hill, or walking  for a longer distance, she would get presyncopal.  She had no chest pain  and no syncope.  She did not feel that she was in any danger of falling,  and felt that if she started getting a little dizzy, all she had to do  was stop and rest and catch her breath, and her symptoms would improve.  She had some dental problems during this period of time as well and  reports increased heart rate with one of the numbing medications used as  well as an allergic reaction to ERYTHROMYCIN.  She states she has not  felt right since.  She went to see Dr. Posey Rea today because of  increased shortness of breath.  She was noted to be tachycardic and  admitted for both reasons.   PAST MEDICAL HISTORY:  1. Hypertension.  2. Allergic rhinitis.  3. Asthma.  4. Gastroesophageal reflux disease.  5. Anxiety.   SURGICAL HISTORY:  She is status post hemorrhoidectomy as well as  colonoscopy, tubal ligation, and D and C.   ALLERGIES:  She is allergic or intolerant to CLINDAMYCIN, PENICILLIN,  SULFA, LANOLIN, and  ASPIRIN, which causes GI upset.   CURRENT MEDICATIONS:  1. Cardizem drip at 10 mg an hour.  2. Aspirin 325 mg daily.  3. Lovenox 65 mg q.12 h.  4. Xopenex q.6 h.  5. Ferritin 10 mg daily.   SOCIAL HISTORY:  She lives in [place] with her husband and is a  retired Haematologist.  She has no history of alcohol, tobacco, or drug  abuse, although she was frequently exposed to secondhand smoke.   FAMILY HISTORY:  Both of her parents were in their late 62s, when they  died and neither her parents nor her siblings had any kind of cardiac  history or problems.   REVIEW OF SYSTEMS:  She has not had fevers or chills.  She has had some  pain from her tooth, but this does not bother her very much.  She has  had presyncope with both heart coughing and increased exertion for about  the last month.  She coughs daily and  wheezes with small amounts of  yellow-brown sputum.  She does feel some anxiety at times.  She has  problems with constipation, which have been better recently.  Full 14-  point review of systems is otherwise negative.   PHYSICAL EXAMINATION:  VITAL SIGNS:  Temperature is 97.2, blood pressure  120/92, pulse 110, respiratory rate 20, and O2 saturation 94% on 2 L.  GENERAL:  She is a well-developed, well-nourished African American  female in no acute distress.  HEENT:  Normal.  NECK:  There is no lymphadenopathy, thyromegaly, bruit, or JVD noted.  CVA:  Heart is irregular in rate and rhythm with an S1 and S2 and no  significant murmur, rub, or gallop is noted.  Distal pulses are intact  in all 4 extremities with no femoral bruits appreciated.  LUNGS:  She has decreased breath sounds at the bases with slight  wheezing noted and rhonchi.  SKIN:  No rashes or lesions are noted.  ABDOMEN:  Soft and nontender with active bowel sounds.  EXTREMITIES:  There is no cyanosis, clubbing, or edema noted.  MUSCULOSKELETAL:  There is no joint deformity or effusions and no spine  or CVA  tenderness.  NEUROLOGIC:  She is alert and oriented.  Cranial nerves II through XII  are grossly intact.   Chest x-ray shows medial right lower lobe opacity and followup chest x-  ray is recommended to resolution.  COPD also seen.   EKG initially rate 163 and repeat EKG shows rate 113, atrial tachycardia  on both.   LABORATORY VALUES:  Hemoglobin 16.8, hematocrit 49.9, WBC 6.1, and  platelets 367.  Sodium 144, potassium 3.9, chloride 109, CO2 26, BUN 12,  creatinine 0.95, glucose 117, CK-MB 133/2.1 with a troponin I of 0.02,  and INR 1.0.  Total cholesterol 209, triglycerides 64, HDL 57, and LDL  139.   IMPRESSION:  Ms. Cerritos was seen today by Dr. Charlies Constable.  She is a 70-  year-old female who was admitted with asthma/bronchitis exacerbation and  SVT.  Rate has slowed some with IV Cardizem.  She has had no chest pain  and no prior history of heart disease.  1. Supraventricular tachycardia:  We think this is multifocal atrial      tachycardia, probably precipitated by or aggravated by pulmonary      disease.  2. Asthmatic bronchitis.  3. Hypertension.   PLAN:  We agree with IV Cardizem and will switch to p.o. tomorrow, if  her rate is improved.  Her rate and rhythm should get better with  improvement in her lung problems.  No anticoagulation is needed for  this, so her Lovenox will be decreased to DVT Lovenox.  We will order a  2-D echocardiogram, but this can be obtained as an outpatient  if she is able to go home prior to have been performed.  If her heart  rate is better controlled and she does not get presyncopal with  ambulation, no further cardiac workup is required as an inpatient and  she could be discharged from a cardiac standpoint.      Theodore Demark, PA-C      Bruce R. Juanda Chance, MD, [facility]  Electronically Signed    RB/MEDQ  D:  10/29/2008  T:  10/30/2008  Job:  [id number]

## 2010-09-13 ENCOUNTER — Encounter: Payer: Self-pay | Admitting: Internal Medicine

## 2010-09-15 NOTE — Assessment & Plan Note (Signed)
[facility]                             PULMONARY OFFICE NOTE   [name], [name]                         MRN:          [id number]  DATE:07/08/2006                            DOB:          1940-08-29    EXTENDED FOLLOW-UP OFFICE VISIT:  A 70 year old black female in for  follow-up evaluation of a PET scan showing that she had no activity in  the left lower lobe nodule that was of concern by CT scan on April 03, 2006.  However, the PET scan suggested a slight increase in size  compared to the CT scan, and follow-up CT scan was recommended in 3  months.  The patient in the meantime feels completely better.  She  denies any cough or dyspnea and has found on her own that she vary  Symbicort 80/4.5 from 1-2 puffs at bedtime to nothing (note that these  were not my instructions).  After several days of missing Symbicort, she  finds her symptoms increase, and then when she goes back on the  Symbicort very rapidly gets everything back under control.   PHYSICAL EXAMINATION:  GENERAL: She is a pleasant, ambulatory black  female in no acute distress.  VITAL SIGNS:  She is afebrile, normal vital signs.  HEENT: Unremarkable. Anicteric sclerae.  LUNG FIELDS: Clear bilaterally to auscultation and percussion, with  excellent air movement.  HEART: Regular rate and rhythm, without murmur, gallop, or rub.  ABDOMEN: Soft, benign.  EXTREMITIES: Warm, without calf tenderness, cyanosis, clubbing, or  edema.   IMPRESSION:  1. Cold nodule by PET scan in this patient, this never smoker, is      probably benign in nature, but I told her even benign nodules can      grow and require resection.  Recommendation is to follow up with a      CT scan in 3 months, and if there is documented growth, then it      makes sense to go ahead and do a segmentectomy now rather than a      lobectomy later.  2. At this time, I reiterated my previous instructions in that      Symbicort  is not a p.r.n. and that she should maintain at least 1      puff at bedtime as a floor and no more than 2 puffs b.i.d. as a      maximum (it would be better, in other words, to take at least 1      puff a day rather than no puffs a day and then be rescued.  This      is obviously not the way Symbicort was designed, but it is very      difficult to convince the patient otherwise, and I do not see the      harm in a lower, more cost-effective approach at this point, as      long as she remembers the above stipulations.     Charlaine Dalton. Sherene Sires, MD, [facility]  Electronically Signed    MBW/MedQ  DD: 07/08/2006  DT: 07/09/2006  Job #: [id number]

## 2010-09-15 NOTE — Assessment & Plan Note (Signed)
[facility]                             PULMONARY OFFICE NOTE   LIBBIE, BARTLEY                         MRN:          [id number]  DATE:05/01/2005                            DOB:          21-Sep-1940    ADDENDUM:  Chart review indicates this patient had a chest x-ray on  March 05, 2006 indicating a density at the left base with no  comparison films available.  This led to a CT scan dated April 03, 2006 indicating a possible left infrahilar nodule which was 1.7 cm in  greatest dimension.  Note that this patient has never smoked, but was  exposed to passive smoke by coworkers and her father.  a followup chest  x-ray has been scheduled in 2 weeks, and at that point will need to  consider a PET scan and PFT's to see whether or not the patient needs to  be considered for a left lower lobectomy at most or segmentectomy at the  very least.  Since we do not have comparison x-rays available, it could  well be this is a long-standing benign nodule in this never smoker.     Charlaine Dalton. Sherene Sires, MD, [facility]     MBW/MedQ  DD: 05/01/2006  DT: 05/01/2006  Job #: [id number]   cc:   Georgina Quint. Plotnikov, MD

## 2010-09-15 NOTE — Assessment & Plan Note (Signed)
[facility]                             PULMONARY OFFICE NOTE   MUSHKA, LACONTE                         MRN:          [id number]  DATE:05/15/2006                            DOB:          1940-10-17    PULMONARY/FOLLOWUP OFFICE VISIT:   HISTORY:  A 70 year old black female returns all smiles with no  significant symptoms of dyspnea or cough since being started on  Symbicort.  I recommended that 80/4.5 two puffs b.i.d.  On her own she  has reduced this down to 2 puffs at bedtime because she is doing so  well.  She has no symptoms at all of any dyspnea, chest pain, fever,  chills, sweats or cough and denies any need for albuterol since she  started the Symbicort.   PHYSICAL EXAMINATION:  She is a pleasant ambulatory black female in no  acute distress.  She has stable vital signs.  Afebrile with no change in her weight at  148 pounds.  HEENT:  Is unremarkable.  Pharynx clear.  LUNGS:  Fields are clear bilaterally to auscultation and percussion  without generalized wheezing or rhonchi.  There is a regular rhythm without murmur, gallop or rub.  ABDOMEN:  Soft, benign.  EXTREMITIES:  Warm without calf tenderness, cyanosis, clubbing or edema.   MDI techniques reviewed and again is very poor but approaches 50% with  coaching.   IMPRESSION:  This lady has asthma that appears to be exquisitely  sensitive to Symbicort and the patient on her own has tapered it down to  2 puffs nightly (which was not my instruction but using the strategy  that the lowest dose is the best dose, I think is reasonable as long as  it attains all the goals of asthma therapy which is minimization of  symptoms and minimum use for a short term beta agonist.)   Chest x-ray is more problematic.  It does suggest a nodule in the left  lower lobe posterior basilar segment behind the heart.  A PET scan was  ordered today and if positive we need to consider her for referral for a  left lower lobe lobectomy after returning for pulmonary function tests  preop.     Charlaine Dalton. Sherene Sires, MD, [facility]  Electronically Signed    MBW/MedQ  DD: 05/15/2006  DT: 05/16/2006  Job #: [id number]   cc:   Georgina Quint. Plotnikov, MD

## 2010-09-15 NOTE — Assessment & Plan Note (Signed)
[facility]                             PULMONARY OFFICE NOTE   Anna, [name]                         MRN:          [id number]  DATE:05/01/2006                            DOB:          Nov 22, 1940    REASON FOR CONSULTATION:  Dyspnea and cough.   HISTORY:  A 70 year old black female never smoker with previously normal  PFTs (see study dated February 13, 2006), with on evidence of air flow  obstruction while actively taking Advair for symptoms that suggested  asthma.  She was also at one point being treated for her reflux, but it  is unclear, even in retrospect, which of the two medicines is important  because she tends to take herself off medicines between visits and then  flares up again within several months.  She comes back today with a  worsening exacerbation of dyspnea and cough and wheeze within a month of  stopping all of her medicines.  She does improve on prednisone but only  transiently.   She denies any exertional chest pain or overt sinus or reflux symptoms,  orthopnea, PND or leg swelling, pleuritic or exertional chest pain,  fevers, chills or sweats or overt dysphagia.   She stopped the medicines a month ago and comes in complaining of  worsening dyspnea and nocturnal cough with minimum sputum production and  dyspnea but an improvement only for 4 hours after she uses albuterol,  otherwise she is short of breath all the time.   PAST MEDICAL HISTORY:  1. Remote thyroidectomy.  2. Hemorrhoidectomy.   ALLERGIES:  PENICILLIN, SULFA and LANOLIN, all with nonspecific  reactions.   MEDICATIONS:  None at present.  She uses albuterol up to four times a  day along with promethazine with codeine.   SOCIAL HISTORY:  She has never smoked.  She works as a Armed forces operational officer.  Denies any unusual travel, pet or hobby exposure.   FAMILY HISTORY:  Positive for allergies and asthma in her father,  otherwise negative for respiratory diseases as  recorded on the  worksheet.   REVIEW OF SYSTEMS:  Also recorded on the worksheet and significant for  the problems outlined above.   PHYSICAL EXAMINATION:  GENERAL:  This is an anxious black female in no  acute distress, who had a great deal of difficulty answering any  questions in a straightforward fashion.  VITAL SIGNS:  She is afebrile with stable vital signs.  HEENT:  Adequate dentition.  NECK:  Supple without cervical adenopathy or tenderness.  Nasal  turbinates normal.  Ear canals clear bilaterally.  LUNGS:  Lung fields revealed inspiratory and expiratory rhonchi, but  these were minimum.  Overall air movement was adequate.  CARDIAC:  Regular rhythm without murmur, gallop, or rub.  ABDOMEN:  Soft, benign.  EXTREMITIES:  Warm without calf tenderness, cyanosis, clubbing or edema.   MDI technique was reviewed and even with coaching only approached  approximately 50% effectiveness.   IMPRESSION:  Chronic asthma with over-reliance on albuterol.  I say  that she is overusing albuterol but doubt that she is really  doing any  harm because she is getting almost none of it to the target.  However,  because cough is her major symptom, I am going to avoid dry powder  inhalers here (which actually may be in the long run a better choice for  her than MDIs) and treat her instead as we did previously with a  combination of long-acting bronchodilators, inhaled steroids, and short-  course prednisone and PPI therapy until we gain control of her  condition.   My main concern is that the patient does understand the difference  between maintenance and p.r.n.'s and that this is absolutely critical  for long-term control of asthma.  I have offered to see her back in 2  weeks and gave her samples for the next 2 weeks to reassess her at that  point in terms of response to therapy.  If the cough is under better  control at that point but she is still symptomatic, I think it would  make sense to  switch her back over to Advair since she has proved to  respond to it previously.   ADDENDUM:  Chart review indicates this patient had a chest x-ray on  March 05, 2006 indicating a density at the left base with no  comparison films available.  This led to a CT scan dated April 03, 2006 indicating a possible left infrahilar nodule which was 1.7 cm in  greatest dimension.  Note that this patient has never smoked, but was  exposed to passive smoke by coworkers and her father.  a followup chest  x-ray has been scheduled in 2 weeks, and at that point will need to  consider a PET scan and PFT's to see whether or not the patient needs to  be considered for a left lower lobectomy at most or segmentectomy at the  very least.  Since we do not have comparison x-rays available, it could  well be this is a long-standing benign nodule in this never smoker.     [name]. Anna Sires, MD, [facility]  Electronically Signed    MBW/MedQ  DD: 05/01/2006  DT: 05/01/2006  Job #: [id number]   cc:   [name]. Plotnikov, MD

## 2010-10-04 ENCOUNTER — Encounter: Payer: Self-pay | Admitting: Internal Medicine

## 2010-10-04 ENCOUNTER — Ambulatory Visit (INDEPENDENT_AMBULATORY_CARE_PROVIDER_SITE_OTHER): Payer: Medicare Other | Admitting: Internal Medicine

## 2010-10-04 ENCOUNTER — Ambulatory Visit: Payer: MEDICARE | Admitting: Internal Medicine

## 2010-10-04 VITALS — BP 140/78 | HR 85 | Ht 63.0 in | Wt 145.2 lb

## 2010-10-04 DIAGNOSIS — J309 Allergic rhinitis, unspecified: Secondary | ICD-10-CM

## 2010-10-04 DIAGNOSIS — J42 Unspecified chronic bronchitis: Secondary | ICD-10-CM

## 2010-10-04 DIAGNOSIS — R05 Cough: Secondary | ICD-10-CM

## 2010-10-04 NOTE — Progress Notes (Signed)
Subjective:    Patient ID: [name], female    DOB: January 12, 1941, 70 y.o.   MRN: [id number]  HPI 08/18/10- HPI 45 yoF never smoker with hx of asthma. Last here in 2008 after hospital stay, when she saw Dr Sherene Sires noting issues of hypertension and asthma meds. Notes reviewed. Seen now on referral by Dr Posey Rea after recent flare with shortness of breath, productive cough. She tends to feel better as long as she is on an antibiotic or steroids. Within a couple of weeks off, she will start to cough and get tight again. Had bronchitis as a child. In the last 5-6 years wheezing dyspnea has been more perennial. Occasionally wakes with cough. Sputum often yellow. Has had pneumonia x 2 with pneumovax. Little GERD or acid indigestion recognized. Triggers have included strong odors, seasonal pollens, chest colds, but not house dust. Had a sinus infection during the winter. Has been treated for heart rhythm and BP, but denies MI/ angina. She is using Symbicort 160 now and considers it sufficient used twice every day. She did not fill a script for a Proair rescue inhaler. 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  10/04/10- Asthma, chronic bronchitis, allergic rhinitis Allergy profile 4/29 was neg for specifics, w/ total IgE 70.9. Dulera sample did well initially then after 3 days,tongue seemed swollen, then went down in a couple of hour after benadryl. She stopped Dulera and went back on Symbicort.  CT chest- 10/02/06- soft tissue density LLL was PET scanned then. We discussed possible un-remembered foreign body aspiration. Question potential for bronchiectasis. Comes for allergy skin testing.:  POS- Gras, trees, Dust   Review of Systems Constitutional:   No weight loss, night sweats,  Fevers, chills, fatigue, lassitude. HEENT:   No headaches,  Difficulty swallowing,  Tooth/dental problems,  Sore throat,                No sneezing, itching, ear ache, nasal congestion, post nasal drip,   CV:  No chest pain,   Orthopnea, PND, swelling in lower extremities, anasarca, dizziness, palpitations  GI  No heartburn, indigestion, abdominal pain, nausea, vomiting, diarrhea, change in bowel habits, loss of appetite  Resp: No shortness of breath with exertion or at rest.  No excess mucus,  No coughing up of blood.  No change in color of mucus.   Skin: no rash or lesions.  GU: no dysuria, change in color of urine, no urgency or frequency.  No flank pain.  MS:  No joint pain or swelling.  No decreased range of motion.  No back pain.  Psych:  No change in mood or affect. No depression or anxiety.  No memory loss.      Objective:   Physical Exam General- Alert, Oriented, Affect-appropriate, Distress- none acute  Skin- rash-none, lesions- none, excoriation- none  Lymphadenopathy- none  Head- atraumatic  Eyes- Gross vision intact, PERRLA, conjunctivae clear secretions  Ears- Hearing, canals- normal  Nose- Clear, No- Septal dev, mucus, polyps, erosion, perforation   Throat- Mallampati II , mucosa clear , drainage- none, tonsils- atrophic  Neck- flexible , trachea midline, no stridor , thyroid nl, carotid no bruit  Chest - symmetrical excursion , unlabored     Heart/CV- RRR , no murmur , no gallop  , no rub, nl s1 s2                     - JVD- none , edema- none, stasis changes- none, varices- none  Lung- Initial wheeze on first deep breath, followed by bronchitic sounding coughs. ,     dullness-none, rub- none     Chest wall-   Abd- tender-no, distended-no, bowel sounds-present, HSM- no  Br/ Gen/ Rectal- Not done, not indicated  Extrem- cyanosis- none, clubbing, none, atrophy- none, strength- nl  Neuro- grossly intact to observation         Assessment & Plan:

## 2010-10-04 NOTE — Assessment & Plan Note (Signed)
She does have more pronounced skin test responses than is often seen in her age group. We discussed environmental precautions and could consider allergy vaccine later if  Simpler measures don't help. There will likely be more seasonal variation than she has paid attention to. I am asking her to try taking an antihistamine off and on, a week at a time, to see what she can tell.

## 2010-10-04 NOTE — Patient Instructions (Signed)
Orders- CT chest with contrast- dx chronic bronchitis              - BMET for renal clearance    Take an antihistamine: Claritin/ loratadine, or Allegra/ fexofenadine                1 daily x 1 week, then skip a week, then repeat.                 Try to notice if you feel any better while on antihistamine daily.

## 2010-10-04 NOTE — Assessment & Plan Note (Addendum)
She describes recurrent bronchitis that clears while on anti biotics but relapses soon after stopping. This suggests bronchiectasis. On exam she has rhonchi in the left lateral chest. That is the region affected by a prolonged consolidation in 2008 which led to PET. That subsequently slowly cleared, but is a likely area of chronic bronchitis/ bronchiectasis.  Plan CT to look for bronchiectasis.

## 2010-10-06 ENCOUNTER — Other Ambulatory Visit (INDEPENDENT_AMBULATORY_CARE_PROVIDER_SITE_OTHER): Payer: Medicare Other

## 2010-10-06 DIAGNOSIS — J40 Bronchitis, not specified as acute or chronic: Secondary | ICD-10-CM

## 2010-10-06 LAB — BASIC METABOLIC PANEL
BUN: 14 mg/dL (ref 6–23)
CO2: 32 mEq/L (ref 19–32)
Calcium: 9 mg/dL (ref 8.4–10.5)
Chloride: 102 mEq/L (ref 96–112)
Creatinine, Ser: 1 mg/dL (ref 0.4–1.2)
GFR: 70.61 mL/min (ref >60.00)
Glucose, Bld: 90 mg/dL (ref 70–99)
Potassium: 4.3 mEq/L (ref 3.5–5.1)
Sodium: 142 mEq/L (ref 135–145)

## 2010-10-11 NOTE — Progress Notes (Signed)
Quick Note:  Pt aware of results. ______ 

## 2010-10-13 ENCOUNTER — Encounter: Payer: Self-pay | Admitting: Internal Medicine

## 2010-10-23 ENCOUNTER — Telehealth: Payer: Self-pay | Admitting: Internal Medicine

## 2010-10-23 NOTE — Telephone Encounter (Signed)
Spoke with pt. She states had emergency in her family and had to cancel CT Chest. She states will call back to reschedule this in a few wks. Will forward msg to CDY so that he is aware.

## 2010-10-24 NOTE — Telephone Encounter (Signed)
CT was for evaluation of suspected bronchiectasis. Ok to cancel for now and reschedule later.

## 2010-10-25 ENCOUNTER — Other Ambulatory Visit: Payer: Medicare Other

## 2010-10-25 NOTE — Telephone Encounter (Signed)
Spoke with Rose at [employer] for patient to cancel CT and reschedule later.

## 2010-11-03 ENCOUNTER — Ambulatory Visit (INDEPENDENT_AMBULATORY_CARE_PROVIDER_SITE_OTHER)
Admission: RE | Admit: 2010-11-03 | Discharge: 2010-11-03 | Disposition: A | Discharge status: Home / Self Care | Payer: Medicare Other | Source: Ambulatory Visit | Attending: Internal Medicine | Admitting: Internal Medicine

## 2010-11-03 DIAGNOSIS — J42 Unspecified chronic bronchitis: Secondary | ICD-10-CM

## 2010-11-03 MED ORDER — IOHEXOL 300 MG/ML  SOLN
80.0000 mL | Freq: Once | INTRAMUSCULAR | Status: AC | PRN
  Administered 2010-11-03: 80 mL via INTRAVENOUS

## 2010-11-06 NOTE — Progress Notes (Signed)
Quick Note:  Spoke with patient-aware of results and next OV on 11-10-10 at 945am ______

## 2010-11-10 ENCOUNTER — Other Ambulatory Visit (INDEPENDENT_AMBULATORY_CARE_PROVIDER_SITE_OTHER): Payer: Medicare Other

## 2010-11-10 ENCOUNTER — Ambulatory Visit (INDEPENDENT_AMBULATORY_CARE_PROVIDER_SITE_OTHER): Payer: Medicare Other | Admitting: Internal Medicine

## 2010-11-10 ENCOUNTER — Encounter: Payer: Self-pay | Admitting: Internal Medicine

## 2010-11-10 ENCOUNTER — Other Ambulatory Visit (HOSPITAL_COMMUNITY): Payer: Self-pay | Admitting: Internal Medicine

## 2010-11-10 VITALS — BP 134/78 | HR 72 | Ht 63.0 in | Wt 144.0 lb

## 2010-11-10 DIAGNOSIS — R222 Localized swelling, mass and lump, trunk: Secondary | ICD-10-CM

## 2010-11-10 DIAGNOSIS — R918 Other nonspecific abnormal finding of lung field: Secondary | ICD-10-CM

## 2010-11-10 DIAGNOSIS — J479 Bronchiectasis, uncomplicated: Secondary | ICD-10-CM | POA: Insufficient documentation

## 2010-11-10 DIAGNOSIS — R0602 Shortness of breath: Secondary | ICD-10-CM

## 2010-11-10 LAB — APTT: aPTT: 35.1 s — ABNORMAL HIGH (ref 21.7–28.8)

## 2010-11-10 LAB — CBC WITH DIFFERENTIAL/PLATELET
Basophils Absolute: 0.1 10*3/uL (ref 0.0–0.1)
Eosinophils Absolute: 0.6 10*3/uL (ref 0.0–0.7)
HCT: 42.9 % (ref 36.0–46.0)
Hemoglobin: 14.4 g/dL (ref 12.0–15.0)
Lymphs Abs: 2.6 10*3/uL (ref 0.7–4.0)
MCHC: 33.5 g/dL (ref 30.0–36.0)
Monocytes Absolute: 0.5 10*3/uL (ref 0.1–1.0)
Monocytes Relative: 8.5 % (ref 3.0–12.0)
Neutro Abs: 2.6 10*3/uL (ref 1.4–7.7)
Platelets: 386 10*3/uL (ref 150.0–400.0)
RDW: 14.2 % (ref 11.5–14.6)

## 2010-11-10 LAB — COMPREHENSIVE METABOLIC PANEL
Alkaline Phosphatase: 77 U/L (ref 39–117)
BUN: 15 mg/dL (ref 6–23)
CO2: 33 mEq/L — ABNORMAL HIGH (ref 19–32)
Creatinine, Ser: 0.8 mg/dL (ref 0.4–1.2)
GFR: 94.03 mL/min (ref >60.00)
Glucose, Bld: 93 mg/dL (ref 70–99)
Sodium: 140 mEq/L (ref 135–145)
Total Bilirubin: 0.4 mg/dL (ref 0.3–1.2)
Total Protein: 8.2 g/dL (ref 6.0–8.3)

## 2010-11-10 LAB — ANGIOTENSIN CONVERTING ENZYME: Angiotensin-Converting Enzyme: 31 U/L (ref 8–52)

## 2010-11-10 NOTE — Progress Notes (Signed)
Subjective:    Patient ID: [name], female    DOB: [birth year], 70 y.o.   MRN: [id number]  HPI 10/04/10-69 yoF never smoker with hx of asthma, left hilar mass/ PET negative.. Last here in 2008 after hospital stay, when she saw Dr Sherene Sires noting issues of hypertension and asthma meds. Notes reviewed. Seen now on referral by Dr Posey Rea after recent flare with shortness of breath, productive cough. She tends to feel better as long as she is on an antibiotic or steroids. Within a couple of weeks off, she will start to cough and get tight again. Had bronchitis as a child. In the last 5-6 years wheezing dyspnea has been more perennial. Occasionally wakes with cough. Sputum often yellow. Has had pneumonia x 2 with pneumovax. Little GERD or acid indigestion recognized. Triggers have included strong odors, seasonal pollens, chest colds, but not house dust. Had a sinus infection during the winter. Has been treated for heart rhythm and BP, but denies MI/ angina. She is using Symbicort 160 now and considers it sufficient used twice every day. She did not fill a script for a Proair rescue inhaler. 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  11/10/10- 69 yoF never smoker with hx of asthma, left hilar mass/ PET neg in 2008/ now progressive, RML atelectasis. CT 11/03/10- showed change progression since prior studies of 2008, when abnormalities / PET negative, were attributed to scarring. -Now 2.6 x 2,2 cm left hilar mass, slowly growing. Post obstructive LLL opacity. New RML collapse. Scattered bronchiectasis. No effusion. Denies fever, night sweat, nodes. Cough has been a little less, with brownish to clear mucus, no blood. Has to chew deliberately to avoid choking. Has rarely awakened with hard cough. Symbicort has helped cough. No weight loss. No chest pain, but some shifting back pain. We reviewed her images and the technique and risks of bronchoscopy.   .Review of Systems See HPI Constitutional:   No weight loss, night  sweats,  Fevers, chills, fatigue, lassitude. HEENT:   No headaches,  Difficulty swallowing,  Tooth/dental problems,  Sore throat,                No sneezing, itching, ear ache,post nasal drip,   CV:  No chest pain, orthopnea, PND, swelling in lower extremities, anasarca, dizziness.     Occasional palpitation  GI  No heartburn, indigestion, abdominal pain, nausea, vomiting, diarrhea, change in bowel habits, loss of appetite  Resp: No excess mucus, ,  No coughing up of blood.  No wheezing.   Skin: no rash or lesions.  GU: no dysuria, change in color of urine, no urgency or frequency.  No flank pain.  MS:  No joint pain or swelling.  No decreased range of motion.  No back pain.  Psych:  No change in mood or affect. No depression or anxiety.  No memory loss.      Objective:   Physical Exam General- Alert, Oriented, Affect-appropriate, Distress- none acute   wdwn  Skin- rash-none, lesions- none, excoriation- none  Lymphadenopathy- none  Head- atraumatic  Eyes- Gross vision intact, PERRLA, conjunctivae clear secretions  Ears- Hearing, canals, Tm- normal,  Nose- Clear, No- Septal dev, mucus, polyps, erosion, perforation   Throat- Mallampati II , mucosa clear , drainage- none, tonsils- atrophic  Neck- flexible , trachea midline, no stridor , thyroid nl, carotid no bruit  Chest - symmetrical excursion , unlabored     Heart/CV- RRR , no murmur , no gallop  , no rub, nl s1 s2                     -  JVD- none , edema- none, stasis changes- none, varices- none     Lung- Mild bilateral wheeze/ rhonchi.      dullness-none, rub- none    Unlabored  Raspy cough     Chest wall- Abd- tender-no, distended-no, bowel sounds-present, HSM- no  Br/ Gen/ Rectal- Not done, not indicated  Extrem- cyanosis- none, clubbing, none, atrophy- none, strength- nl  Neuro- grossly intact to observation         Assessment & Plan:   Subjective:    Patient ID: [name], female    DOB:  1940/06/19, 70 y.o.   MRN: [id number]  HPI    Review of Systems     Objective:   Physical Exam        Assessment & Plan:

## 2010-11-10 NOTE — Patient Instructions (Signed)
Orders-   CBC w/ diff, PT, PTT,  CMET,  ACE level   Lung mass                 PCC- please help schedule bronchoscopy with TBBX for dx lung mass around 12:15or 12:30 of one day next week

## 2010-11-13 NOTE — Assessment & Plan Note (Signed)
Very slow growing process - sarcoid or a slow neoplasm are possible. TB considered less likely. She describes some need to chew and swallow deliberately, but not obvious aspiration risk. We will be updating PET and other labs, but the most direct approach is bronchoscopy. We have discussed commonly considered risks and potential complications, including potential for major complications- bleed, respiratory arrest/ intubation/ vent support/ cardiovascular or cerebrovascular event,/ bleeding/ sudden death. She asked appropriate questions and has agreed to proceed.

## 2010-11-13 NOTE — Progress Notes (Signed)
Quick Note:  LMOMTCB ______ 

## 2010-11-14 ENCOUNTER — Telehealth: Payer: Self-pay | Admitting: Internal Medicine

## 2010-11-14 NOTE — Telephone Encounter (Signed)
Per CDY, yes the bronch is still on for Thursday - remind pt not to take aspirin from now until the bronch.  She will need a follow up appt in 1-2 wks (just need time for results from bronch to be available).  Ok per CDY to use [facility].    Called, spoke with pt.  She is aware bronch is still on for this Thursday and not to take aspirin from now until after bronch per CDY.  She verbalized understanding of this.  Pt also aware she will need to f/u in 1-2 weeks after bronch so that results will be available.  OV scheduled for Aug 1 at 11:15 am -- pt aware and will call back if anything is needed prior to this.    Note: I checked this date with CDY and he was ok with it.

## 2010-11-14 NOTE — Telephone Encounter (Signed)
Notes Recorded by Waymon Budge, MD on 11/13/2010 at 9:54 AM Labs- minor changes of no real significance- reviewed. We will discuss at return.   Called, spoke with pt.  She was informed of lab results per CDY.  She verbalized understanding of this and is aware he will discuss with her further at return.  She wanted to know if bronch is still on for this Thursday, which per epic, it is still scheduled.  Pt was informed of this.  She also states CDY wanted to see her back in 1 or 2 wks after this but did not know which one.  Advised I would ask him.  Also, pt will need to be worked in as there are no openings.  CDY, pls advise.  Thanks!

## 2010-11-14 NOTE — Progress Notes (Signed)
Quick Note:  Called, spoke with pt. She was informed of labs results per CDY and is aware he will discuss further at return. She verbalized understanding of this. ______

## 2010-11-14 NOTE — Telephone Encounter (Signed)
lmtcb

## 2010-11-16 ENCOUNTER — Other Ambulatory Visit: Payer: Self-pay | Admitting: Internal Medicine

## 2010-11-16 ENCOUNTER — Ambulatory Visit (HOSPITAL_COMMUNITY)
Admission: RE | Admit: 2010-11-16 | Discharge: 2010-11-16 | Disposition: A | Discharge status: Home / Self Care | Payer: Medicare Other | Source: Ambulatory Visit | Attending: Internal Medicine | Admitting: Internal Medicine

## 2010-11-16 ENCOUNTER — Encounter (HOSPITAL_COMMUNITY): Payer: Medicare Other

## 2010-11-16 DIAGNOSIS — R918 Other nonspecific abnormal finding of lung field: Secondary | ICD-10-CM

## 2010-11-22 ENCOUNTER — Ambulatory Visit (INDEPENDENT_AMBULATORY_CARE_PROVIDER_SITE_OTHER): Payer: Medicare Other | Admitting: Internal Medicine

## 2010-11-22 ENCOUNTER — Encounter: Payer: Self-pay | Admitting: Internal Medicine

## 2010-11-22 DIAGNOSIS — F411 Generalized anxiety disorder: Secondary | ICD-10-CM

## 2010-11-22 DIAGNOSIS — R000 Tachycardia, unspecified: Secondary | ICD-10-CM

## 2010-11-22 DIAGNOSIS — R918 Other nonspecific abnormal finding of lung field: Secondary | ICD-10-CM

## 2010-11-22 DIAGNOSIS — I10 Essential (primary) hypertension: Secondary | ICD-10-CM

## 2010-11-22 DIAGNOSIS — J45909 Unspecified asthma, uncomplicated: Secondary | ICD-10-CM

## 2010-11-22 DIAGNOSIS — R222 Localized swelling, mass and lump, trunk: Secondary | ICD-10-CM

## 2010-11-22 NOTE — Assessment & Plan Note (Signed)
On RX 

## 2010-11-22 NOTE — Assessment & Plan Note (Signed)
On rx 

## 2010-11-22 NOTE — Assessment & Plan Note (Signed)
PET scan is pending tomorrow

## 2010-11-22 NOTE — Progress Notes (Signed)
  Subjective:    Patient ID: [name], female    DOB: 1940/10/30, 70 y.o.   MRN: [id number]  HPI  The patient presents for a follow-up of  chronic hypertension, chronic tachycardia, depression, abn chest CT    Review of Systems  Constitutional: Negative for chills, activity change, appetite change, fatigue and unexpected weight change.  HENT: Negative for congestion, mouth sores and sinus pressure.   Eyes: Negative for visual disturbance.  Respiratory: Positive for chest tightness (better). Negative for cough.   Gastrointestinal: Negative for nausea and abdominal pain.  Genitourinary: Negative for frequency, difficulty urinating and vaginal pain.  Musculoskeletal: Negative for back pain and gait problem.  Skin: Negative for pallor and rash.  Neurological: Negative for dizziness, tremors, weakness, numbness and headaches.  Psychiatric/Behavioral: Negative for confusion and sleep disturbance.       Objective:   Physical Exam  Constitutional: She appears well-developed and well-nourished. No distress.  HENT:  Head: Normocephalic.  Right Ear: External ear normal.  Left Ear: External ear normal.  Nose: Nose normal.  Mouth/Throat: Oropharynx is clear and moist.  Eyes: Conjunctivae are normal. Pupils are equal, round, and reactive to light. Right eye exhibits no discharge. Left eye exhibits no discharge.  Neck: Normal range of motion. Neck supple. No JVD present. No tracheal deviation present. No thyromegaly present.  Cardiovascular: Normal rate, regular rhythm and normal heart sounds.   Pulmonary/Chest: No stridor. No respiratory distress. She has no wheezes.  Abdominal: Soft. Bowel sounds are normal. She exhibits no distension and no mass. There is no tenderness. There is no rebound and no guarding.  Musculoskeletal: She exhibits no edema and no tenderness.  Lymphadenopathy:    She has no cervical adenopathy.  Neurological: She displays normal reflexes. No cranial nerve  deficit. She exhibits normal muscle tone. Coordination normal.  Skin: No rash noted. No erythema.  Psychiatric: She has a normal mood and affect. Her behavior is normal. Judgment and thought content normal.          Assessment & Plan:

## 2010-11-22 NOTE — Assessment & Plan Note (Signed)
On Rx 

## 2010-11-22 NOTE — Assessment & Plan Note (Signed)
Better now 

## 2010-11-23 ENCOUNTER — Encounter (HOSPITAL_COMMUNITY)
Admission: RE | Admit: 2010-11-23 | Discharge: 2010-11-23 | Disposition: A | Discharge status: Home / Self Care | Payer: Medicare Other | Source: Ambulatory Visit | Attending: Internal Medicine | Admitting: Internal Medicine

## 2010-11-23 ENCOUNTER — Encounter (HOSPITAL_COMMUNITY): Payer: Self-pay

## 2010-11-23 DIAGNOSIS — R918 Other nonspecific abnormal finding of lung field: Secondary | ICD-10-CM | POA: Insufficient documentation

## 2010-11-23 MED ORDER — FLUDEOXYGLUCOSE F - 18 (FDG) INJECTION
17.7000 | Freq: Once | INTRAVENOUS | Status: AC | PRN
  Administered 2010-11-23: 17.7 via INTRAVENOUS

## 2010-11-27 ENCOUNTER — Other Ambulatory Visit: Payer: Self-pay | Admitting: Internal Medicine

## 2010-11-27 ENCOUNTER — Telehealth: Payer: Self-pay | Admitting: Internal Medicine

## 2010-11-27 DIAGNOSIS — R918 Other nonspecific abnormal finding of lung field: Secondary | ICD-10-CM

## 2010-11-27 NOTE — Telephone Encounter (Signed)
LMOM for [name] TCB.  Need to know what type of PET this should be for cards to schedule.

## 2010-11-27 NOTE — Progress Notes (Signed)
Quick Note:  Spoke with patient-aware of results-would like to schedule bronch for Thursday 11-30-10. I have sent PCC's and order and spoken with Bjorn Loser regarding this order. ______

## 2010-11-29 ENCOUNTER — Ambulatory Visit: Payer: Medicare Other | Admitting: Internal Medicine

## 2010-11-29 NOTE — Telephone Encounter (Signed)
I called [facility] radiology-spoke with Kasey-confirmed that appt was still booked(no problems with order) for broch with fluro on 11-30-10 for patient.

## 2010-11-30 ENCOUNTER — Telehealth: Payer: Self-pay | Admitting: Internal Medicine

## 2010-11-30 ENCOUNTER — Other Ambulatory Visit: Payer: Self-pay | Admitting: Internal Medicine

## 2010-11-30 ENCOUNTER — Ambulatory Visit (HOSPITAL_COMMUNITY)
Admission: RE | Admit: 2010-11-30 | Discharge: 2010-11-30 | Disposition: A | Discharge status: Home / Self Care | Payer: Medicare Other | Source: Ambulatory Visit | Attending: Internal Medicine | Admitting: Internal Medicine

## 2010-11-30 DIAGNOSIS — R059 Cough, unspecified: Secondary | ICD-10-CM | POA: Insufficient documentation

## 2010-11-30 DIAGNOSIS — R222 Localized swelling, mass and lump, trunk: Secondary | ICD-10-CM

## 2010-11-30 DIAGNOSIS — J45909 Unspecified asthma, uncomplicated: Secondary | ICD-10-CM | POA: Insufficient documentation

## 2010-11-30 DIAGNOSIS — R918 Other nonspecific abnormal finding of lung field: Secondary | ICD-10-CM

## 2010-11-30 DIAGNOSIS — R05 Cough: Secondary | ICD-10-CM | POA: Insufficient documentation

## 2010-11-30 NOTE — Telephone Encounter (Signed)
I called and spoke to Mr Ehlert this evening to check on [facility] after bronchoscopy today. She is home and stable with no concerns reported. I will see her for planned office appointment to f/u when path available.

## 2010-12-03 LAB — CULTURE, RESPIRATORY W GRAM STAIN

## 2010-12-12 ENCOUNTER — Encounter: Payer: Self-pay | Admitting: Internal Medicine

## 2010-12-12 ENCOUNTER — Ambulatory Visit (INDEPENDENT_AMBULATORY_CARE_PROVIDER_SITE_OTHER): Payer: Medicare Other | Admitting: Internal Medicine

## 2010-12-12 VITALS — BP 122/78 | HR 66 | Ht 63.0 in | Wt 143.0 lb

## 2010-12-12 DIAGNOSIS — R918 Other nonspecific abnormal finding of lung field: Secondary | ICD-10-CM

## 2010-12-12 DIAGNOSIS — R222 Localized swelling, mass and lump, trunk: Secondary | ICD-10-CM

## 2010-12-12 NOTE — Progress Notes (Signed)
Subjective:    Patient ID: [name], female    DOB: 1941-01-22, 70 y.o.   MRN: [id number]  HPI    Review of Systems     Objective:   Physical Exam        Assessment & Plan:   Subjective:    Patient ID: [name], female    DOB: 14-Feb-1941, 70 y.o.   MRN: [id number]  HPI 10/04/10-70 yoF never smoker with hx of asthma, left hilar mass/ PET negative.. Last here in 2008 after hospital stay, when she saw Dr Sherene Sires noting issues of hypertension and asthma meds. Notes reviewed. Seen now on referral by Dr Posey Rea after recent flare with shortness of breath, productive cough. She tends to feel better as long as she is on an antibiotic or steroids. Within a couple of weeks off, she will start to cough and get tight again. Had bronchitis as a child. In the last 5-6 years wheezing dyspnea has been more perennial. Occasionally wakes with cough. Sputum often yellow. Has had pneumonia x 2 with pneumovax. Little GERD or acid indigestion recognized. Triggers have included strong odors, seasonal pollens, chest colds, but not house dust. Had a sinus infection during the winter. Has been treated for heart rhythm and BP, but denies MI/ angina. She is using Symbicort 160 now and considers it sufficient used twice every day. She did not fill a script for a Proair rescue inhaler. 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 CT 11/03/10- showed change progression since prior studies of 2008, when abnormalities / PET negative, were attributed to scarring. -Now 2.6 x 2,2 cm left hilar mass, slowly growing. Post obstructive LLL opacity. New RML collapse. Scattered bronchiectasis. No effusion. Denies fever, night sweat, nodes. Cough has been a little less, with brownish to clear mucus, no blood. Has to chew deliberately to avoid choking. Has rarely awakened with hard cough. Symbicort has helped cough. No  weight loss. No chest pain, but some shifting back pain. We reviewed her images and the technique and risks of bronchoscopy.   12/12/10- 70 yoF never smoker with hx of asthma, left hilar mass/ PET neg in 2008/ now progressive, RML atelectasis. PET neg 2012. Husband here  Post bronchoscopy f/u - Bronch 11/30/10- necrotic appearing mass occluding left lower lobe bronchus.  Path- small lymphocytes and eosinophils, neg for malignancy or granuloma. Cultures negative so far.  She still coughs up phlegm, but less than before bronchoscopy.  Aware of a variable nodule in left axilla- comes and goes, otherwise no lumps, nodes or etc. Occasional sweat on morning waking, otherwise no fever or sweat and no pain or blood. She has been aware "something is going on" but not toxic.   Marland KitchenReview of Systems See HPI Constitutional:   No weight loss, night sweats,  Fevers, chills, fatigue, lassitude. HEENT:   No headaches,  Difficulty swallowing,  Tooth/dental problems,  Sore throat,                No sneezing, itching, ear ache,post nasal drip,  CV:  No chest pain, orthopnea, PND, swelling in lower extremities, anasarca, dizziness.     Occasional palpitation GI  No heartburn, indigestion, abdominal pain, nausea, vomiting, diarrhea, change in bowel habits, loss of appetite Resp: No excess mucus, ,  No coughing up of blood.  No wheezing.  Skin: no rash or lesions. GU: no dysuria, change in  color of urine, no urgency or frequency.  No flank pain. MS:  No joint pain or swelling.  No decreased range of motion.  No back pain. Psych:  No change in mood or affect. No depression or anxiety.  No memory loss.   Objective:   Physical Exam General- Alert, Oriented, Affect-appropriate, Distress- none acute  Medium build- not sick appearing Skin- rash-none, lesions- none, excoriation- none Lymphadenopathy- none Head- atraumatic            Eyes- Gross vision intact, PERRLA, conjunctivae clear secretions            Ears- Hearing,  canals normal            Nose- Clear, No-Septal dev, mucus, polyps, erosion, perforation             Throat- Mallampati II , mucosa clear , drainage- none, tonsils- atrophic Neck- flexible , trachea midline, no stridor , thyroid nl, carotid no bruit Chest - symmetrical excursion , unlabored           Heart/CV- RRR , no murmur , no gallop  , no rub, nl s1 s2                           - JVD- none , edema- none, stasis changes- none, varices- none           Lung- +mild bilateral rhonchi, unlabored , cough- none , dullness-none, rub- none           Chest wall-  Abd- tender-no, distended-no, bowel sounds-present, HSM- no Br/ Gen/ Rectal- Not done, not indicated Extrem- cyanosis- none, clubbing, none, atrophy- none, strength- nl Neuro- grossly intact to observation     Neuro- grossly intact to observation         Assessment & Plan:   Subjective:    Patient ID: [name], female    DOB: [birth year], 70 y.o.   MRN: [id number]  HPI    Review of Systems     Objective:   Physical Exam        Assessment & Plan:

## 2010-12-12 NOTE — Assessment & Plan Note (Signed)
Unexplained disorder that looks visually like necrotic tumor, but might be a benign process. I related the discussion at Thoracic conference and my conversation with Dr Edwyna Shell about possibility of rigid bronchoscopy for larger access. We will make that referral.

## 2010-12-12 NOTE — Patient Instructions (Signed)
Order- Surgical Consult referral to Dr Edwyna Shell  Dx lung mass for possible rigid bronchoscopy

## 2010-12-13 NOTE — Op Note (Signed)
  [name], [name]                  ACCOUNT NO.:  [id number]  MEDICAL RECORD NO.:  [id number]  LOCATION:                                 FACILITY:  PHYSICIAN:  Ryle Buscemi D. Maple Hudson, MD, FCCP, FACPDATE OF BIRTH:  12[birth year]04-40  DATE OF PROCEDURE:  11/30/2010 DATE OF DISCHARGE:                              OPERATIVE REPORT   INDICATIONS FOR PROCEDURE:  A 70 year old woman with slowly growing hilar mass and cough.  DESCRIPTION OF PROCEDURE:  After fully informed consent, bronchoscopy was performed on an outpatient basis in the endoscopy suite.  [name] was monitored by standard protocol and provided supplemental nasal prong oxygen.  A cumulative dose of 7 mg of intravenous Versed was required through the procedure for adequate sedation and cough control.  The upper airway was anesthetized topically with lidocaine.  A fiberoptic videoscope was introduced via the right nostril to the level of the vocal cords without difficulty.  Cords moved normally.  Cough was moderate.  The trachea and main carina were unremarkable.  The right bronchial tree was significant mainly for slight bloody secretion returned after introduction of the bronchoscope into the medial segment of the right middle lobe.  The left bronchial tree was significant for a very thick inspissated mucoid secretions in the lingula and left lower lobe.  When these were flushed clear, there was necrotic material occluding the left lower lobe bronchus.  The base of this was biopsied and was friable.  This appeared to be necrotic tumor and the lumen patency was not restored.  There was minimal self-limited bleeding.  [name] tolerated the procedure well and is being held until stable before return home with [name] to office followup.  Specimens included bronchial washing and brushing for cytopathology and routine cultures, bronchial wall biopsies from the left lower lobe bronchus for Pathology.     Kamyla Olejnik D. Maple Hudson, MD, [facility]  FACP     CDY/MEDQ  D:  11/30/2010  T:  12/01/2010  Job:  [id number]  Electronically Signed by Jetty Duhamel MD FCCP FACP on 12/13/2010 01:15:36 PM

## 2010-12-18 ENCOUNTER — Encounter: Payer: Self-pay | Admitting: Internal Medicine

## 2010-12-27 ENCOUNTER — Ambulatory Visit (INDEPENDENT_AMBULATORY_CARE_PROVIDER_SITE_OTHER): Payer: Medicare Other | Admitting: Thoracic Surgery

## 2010-12-27 ENCOUNTER — Encounter: Payer: Self-pay | Admitting: Thoracic Surgery

## 2010-12-27 VITALS — BP 160/74 | HR 76 | Temp 97.8°F | Resp 18 | Ht 63.0 in | Wt 143.0 lb

## 2010-12-27 DIAGNOSIS — D491 Neoplasm of unspecified behavior of respiratory system: Secondary | ICD-10-CM

## 2010-12-27 NOTE — Progress Notes (Signed)
HPI this is 70 year old African American female developed a cough and excessive sputum. She underwent a bronchoscopy by Dr. Maple Hudson which revealed a left lower left upper lobe left lower lobe necrotic mass. Biopsies of the abundant small lymphocytes and he is to have eosinophils with a chronic lymphocytic inflammation. Her pulmonary function test showed mild restrictive disease. She is referred for repeat bronchoscopy with possible rigid bronchoscopy to obtain a diagnosis. She has had some mild fevers no hemoptysis. A PET scan was done which did not show any uptake. We plan to repeat a bronchoscopy at [facility] using the large flexible bronchoscope under general anesthesia. We will do a rigid bronchoscopy at if needed.    Current Outpatient Prescriptions  Medication Sig Dispense Refill  . acetaminophen (TYLENOL) 325 MG tablet Per bottle as needed       . albuterol (PROAIR HFA) 108 (90 BASE) MCG/ACT inhaler Inhale 2 puffs into the lungs every 4 (four) hours as needed. For shortness of breath       . budesonide-formoterol (SYMBICORT) 160-4.5 MCG/ACT inhaler Inhale 1 puff into the lungs 2 (two) times daily.        . Cholecalciferol 1000 UNITS tablet Take 1,000 Units by mouth daily.        Marland Kitchen diltiazem (CARDIZEM) 120 MG tablet Take 120 mg by mouth 2 (two) times daily.        Marland Kitchen loratadine (CLARITIN) 10 MG tablet Take 1 tablet (10 mg total) by mouth daily. As needed for allergies  30 tablet  11  . LORazepam (ATIVAN) 1 MG tablet Take 1 mg by mouth 2 (two) times daily as needed. For anxiety       . Multiple Vitamin (MULTIVITAMIN) tablet Take 1 tablet by mouth daily.        . promethazine-codeine (PHENERGAN WITH CODEINE) 6.25-10 MG/5ML syrup Take 5 mLs by mouth every 6 (six) hours as needed for cough.  480 mL  0      Physical Exam  Constitutional: She appears well-developed and well-nourished.  HENT:  Head: Normocephalic.  Right Ear: External ear normal.  Left Ear: External ear normal.  Nose: Nose  normal.  Eyes: EOM are normal. Pupils are equal, round, and reactive to light.  Neck: Normal range of motion. Neck supple. No thyromegaly present.  Cardiovascular: Normal rate and regular rhythm.   No murmur heard. Pulmonary/Chest: Effort normal and breath sounds normal.  Abdominal: Soft. Bowel sounds are normal.  Musculoskeletal: Normal range of motion.  Lymphadenopathy:    She has no cervical adenopathy.  Neurological: She is alert. She has normal reflexes.  Skin: Skin is warm.  Psychiatric: She has a normal mood and affect. Her behavior is normal. Judgment and thought content normal.     Diagnostic tests:Bronchoscopy   Impression: Benign left hilar mass   Plan: Flexible bronchoscopy possible rigid bronchoscopy.

## 2010-12-29 LAB — FUNGUS CULTURE W SMEAR: Fungal Smear: NONE SEEN

## 2011-01-12 ENCOUNTER — Ambulatory Visit: Payer: Medicare Other | Admitting: Internal Medicine

## 2011-01-13 LAB — AFB CULTURE WITH SMEAR (NOT AT ARMC)

## 2011-01-16 NOTE — Progress Notes (Signed)
Quick Note:  Pt aware of results. ______ 

## 2011-01-17 ENCOUNTER — Encounter (HOSPITAL_COMMUNITY)
Admission: RE | Admit: 2011-01-17 | Discharge: 2011-01-17 | Disposition: A | Discharge status: Home / Self Care | Payer: Medicare Other | Source: Ambulatory Visit | Attending: Thoracic Surgery | Admitting: Thoracic Surgery

## 2011-01-17 LAB — COMPREHENSIVE METABOLIC PANEL
ALT: 11 U/L (ref 0–35)
AST: 17 U/L (ref 0–37)
Albumin: 3.5 g/dL (ref 3.5–5.2)
Alkaline Phosphatase: 83 U/L (ref 39–117)
Potassium: 4.4 mEq/L (ref 3.5–5.1)
Sodium: 141 mEq/L (ref 135–145)
Total Protein: 7.4 g/dL (ref 6.0–8.3)

## 2011-01-17 LAB — CBC
HCT: 42.4 % (ref 36.0–46.0)
Hemoglobin: 14.1 g/dL (ref 12.0–15.0)
RBC: 4.75 MIL/uL (ref 3.87–5.11)

## 2011-01-17 LAB — PROTIME-INR
INR: 1.02 (ref 0.00–1.49)
Prothrombin Time: 13.6 seconds (ref 11.6–15.2)

## 2011-01-17 LAB — APTT: aPTT: 33 seconds (ref 24–37)

## 2011-01-24 ENCOUNTER — Other Ambulatory Visit: Payer: Self-pay | Admitting: Thoracic Surgery

## 2011-01-24 ENCOUNTER — Ambulatory Visit (HOSPITAL_COMMUNITY): Payer: Medicare Other

## 2011-01-24 ENCOUNTER — Ambulatory Visit (HOSPITAL_COMMUNITY)
Admission: RE | Admit: 2011-01-24 | Discharge: 2011-01-24 | Disposition: A | Discharge status: Home / Self Care | Payer: Medicare Other | Source: Ambulatory Visit | Attending: Thoracic Surgery | Admitting: Thoracic Surgery

## 2011-01-24 DIAGNOSIS — R222 Localized swelling, mass and lump, trunk: Secondary | ICD-10-CM | POA: Insufficient documentation

## 2011-01-24 DIAGNOSIS — Z01812 Encounter for preprocedural laboratory examination: Secondary | ICD-10-CM | POA: Insufficient documentation

## 2011-01-24 DIAGNOSIS — Z01818 Encounter for other preprocedural examination: Secondary | ICD-10-CM | POA: Insufficient documentation

## 2011-01-24 DIAGNOSIS — D381 Neoplasm of uncertain behavior of trachea, bronchus and lung: Secondary | ICD-10-CM

## 2011-01-24 DIAGNOSIS — Z01810 Encounter for preprocedural cardiovascular examination: Secondary | ICD-10-CM | POA: Insufficient documentation

## 2011-01-24 DIAGNOSIS — R918 Other nonspecific abnormal finding of lung field: Secondary | ICD-10-CM

## 2011-01-26 LAB — CULTURE, RESPIRATORY W GRAM STAIN
Culture: NO GROWTH
Gram Stain: NONE SEEN

## 2011-01-29 NOTE — Op Note (Signed)
  NAMEAUBRII, [name]                  ACCOUNT NO.:  [id number]  MEDICAL RECORD NO.:  [id number]  LOCATION:                                 FACILITY:  PHYSICIAN:  Ines Bloomer, M.D. DATE OF BIRTH:  02-19-1941  DATE OF PROCEDURE:  01/24/2011 DATE OF DISCHARGE:                              OPERATIVE REPORT   PREOPERATIVE DIAGNOSIS:  Left lower lobe questionable inflammatory mass.  POSTOPERATIVE DIAGNOSIS:  Left lower lobe questionable inflammatory mass.  OPERATION PERFORMED:  Fiberoptic bronchoscopy with endobronchial ultrasound.  SURGEON:  Ines Bloomer, MD  After general anesthesia, the video bronchoscope was passed through the endotracheal tube.  The carina was in the midline.  The right mainstem, right upper lobe, and right lower lobe orifices were normal.  There were some secretions in the right lower lobe, which cultures were taken of that.  Left mainstem bronchus was normal.  The left upper lobe bronchus were normal.  I passed down into the left lower lobe.  There was an area in the medial basilar segment where previous biopsies had been taken by Dr. Maple Hudson of a kind of flap-like area, but there was no more of necrotic tissue which had been seen before.  I was wondering if this was probably some type of inflammatory mass that had been caused by an infection which had been caused by this kind of a flap-like material and medial basilar segment of the right lower lobe.  We did biopsies of this using a 2.2 biopsy forceps and removed the flap almost completely.  No other endobronchial lesions were seen.  Brushings were also taken from this area.  The video bronchoscope was removed.  The patient tolerated the procedure well and was turned to the recovery room in stable condition.     Ines Bloomer, M.D.     DPB/MEDQ  D:  01/24/2011  T:  01/24/2011  Job:  [id number]  Electronically Signed by Jovita Gamma M.D. on 01/29/2011 12:51:09 PM

## 2011-01-30 ENCOUNTER — Ambulatory Visit: Payer: Medicare Other | Admitting: Thoracic Surgery

## 2011-02-07 ENCOUNTER — Ambulatory Visit (INDEPENDENT_AMBULATORY_CARE_PROVIDER_SITE_OTHER): Payer: Medicare Other | Admitting: Thoracic Surgery

## 2011-02-07 ENCOUNTER — Encounter: Payer: Self-pay | Admitting: Thoracic Surgery

## 2011-02-07 VITALS — BP 143/78 | HR 86 | Resp 18 | Ht 63.0 in | Wt 143.0 lb

## 2011-02-07 DIAGNOSIS — Z9889 Other specified postprocedural states: Secondary | ICD-10-CM

## 2011-02-07 DIAGNOSIS — D491 Neoplasm of unspecified behavior of respiratory system: Secondary | ICD-10-CM

## 2011-02-07 NOTE — Progress Notes (Signed)
HPI patient returns for followup. Bronchoscopy findings were negative no evidence of cancer. I will repeat her CT scan iin 3 months. I will see her back at that time. She complains of some mild hemoptysis and excessive sputum. Hopefully this will resolve. I told her that she continues to have symptoms to see either Dr. Maple Hudson or myself.   Current Outpatient Prescriptions  Medication Sig Dispense Refill  . acetaminophen (TYLENOL) 325 MG tablet Per bottle as needed       . albuterol (PROAIR HFA) 108 (90 BASE) MCG/ACT inhaler Inhale 2 puffs into the lungs every 4 (four) hours as needed. For shortness of breath       . budesonide-formoterol (SYMBICORT) 160-4.5 MCG/ACT inhaler Inhale 1 puff into the lungs 2 (two) times daily.        . Cholecalciferol 1000 UNITS tablet Take 1,000 Units by mouth daily.        Marland Kitchen diltiazem (CARDIZEM) 120 MG tablet Take 120 mg by mouth 2 (two) times daily.        Marland Kitchen loratadine (CLARITIN) 10 MG tablet Take 1 tablet (10 mg total) by mouth daily. As needed for allergies  30 tablet  11  . LORazepam (ATIVAN) 1 MG tablet Take 1 mg by mouth 2 (two) times daily as needed. For anxiety       . Multiple Vitamin (MULTIVITAMIN) tablet Take 1 tablet by mouth daily.        . promethazine-codeine (PHENERGAN WITH CODEINE) 6.25-10 MG/5ML syrup Take 5 mLs by mouth every 6 (six) hours as needed for cough.  480 mL  0     Review of Systems: Unchanged   Physical Exam  Constitutional: She appears well-developed and well-nourished.  Cardiovascular: Normal rate, regular rhythm and normal heart sounds.   Pulmonary/Chest: Breath sounds normal. No respiratory distress.     Diagnostic Tests: Bronchoscopy revealed no endobronchial lesions all biopsies were negative.  Impression: Right negative left lower lobe mass. History of of endobronchial secretions.   Plan: Repeat CT scan in 3 months.

## 2011-02-19 LAB — FUNGUS CULTURE W SMEAR: Fungal Smear: NONE SEEN

## 2011-03-08 LAB — AFB CULTURE WITH SMEAR (NOT AT ARMC)

## 2011-03-28 ENCOUNTER — Encounter: Payer: Self-pay | Admitting: Internal Medicine

## 2011-03-28 ENCOUNTER — Ambulatory Visit (INDEPENDENT_AMBULATORY_CARE_PROVIDER_SITE_OTHER): Payer: Medicare Other | Admitting: Internal Medicine

## 2011-03-28 DIAGNOSIS — J42 Unspecified chronic bronchitis: Secondary | ICD-10-CM

## 2011-03-28 DIAGNOSIS — J45909 Unspecified asthma, uncomplicated: Secondary | ICD-10-CM

## 2011-03-28 DIAGNOSIS — R918 Other nonspecific abnormal finding of lung field: Secondary | ICD-10-CM

## 2011-03-28 DIAGNOSIS — R222 Localized swelling, mass and lump, trunk: Secondary | ICD-10-CM

## 2011-03-28 DIAGNOSIS — J309 Allergic rhinitis, unspecified: Secondary | ICD-10-CM

## 2011-03-28 MED ORDER — ROFLUMILAST 500 MCG PO TABS: 500.0000 ug | ORAL_TABLET | Freq: Every day | ORAL | Status: DC

## 2011-03-28 MED ORDER — TIOTROPIUM BROMIDE MONOHYDRATE 18 MCG IN CAPS: 18.0000 ug | ORAL_CAPSULE | Freq: Every day | RESPIRATORY_TRACT | Status: DC

## 2011-03-28 MED ORDER — AZITHROMYCIN 250 MG PO TABS: ORAL_TABLET | ORAL | Status: AC

## 2011-03-28 MED ORDER — AZELASTINE-FLUTICASONE 137-50 MCG/ACT NA SUSP: 1.0000 | NASAL | Status: DC

## 2011-03-28 MED ORDER — METHYLPREDNISOLONE ACETATE PF 80 MG/ML IJ SUSP
120.0000 mg | Freq: Once | INTRAMUSCULAR | Status: AC
  Administered 2011-03-28: 120 mg via INTRAMUSCULAR

## 2011-03-28 NOTE — Assessment & Plan Note (Signed)
Worse - try Dymista Zpac if worse

## 2011-03-28 NOTE — Assessment & Plan Note (Signed)
See Meds 

## 2011-03-28 NOTE — Assessment & Plan Note (Signed)
CT 10/02/06- stable confluent soft tissue LLL c/w scarring. PET 05/22/06 neg. CT -11/03/10- Progression L hilar mass 2.6 x 2.2 cm w/ post obstructive LLL density. New RML collapse. Scattered bronchiectasis. Bronch 11/30/10- benign material lymphocytes and eos, with necrotic appearing mass occluding LLL.   PET 2012 NEG

## 2011-03-28 NOTE — Progress Notes (Signed)
  Subjective:    Patient ID: [name], female    DOB: 1940-11-11, 70 y.o.   MRN: [id number]  HPI  The patient presents for a follow-up of  Chronic asthma/COPD - not better, abn CXR, hypertension, chronic cough - not better; SOB C/o sinus sx's.   Review of Systems  Constitutional: Positive for fatigue. Negative for fever, chills, activity change, appetite change and unexpected weight change.  HENT: Negative for congestion, mouth sores and sinus pressure.   Eyes: Negative for visual disturbance.  Respiratory: Positive for cough, choking, chest tightness, shortness of breath and wheezing.   Gastrointestinal: Negative for nausea and abdominal pain.  Genitourinary: Negative for frequency, difficulty urinating and vaginal pain.  Musculoskeletal: Negative for back pain and gait problem.  Skin: Negative for pallor and rash.  Neurological: Negative for dizziness, tremors, weakness, numbness and headaches.  Psychiatric/Behavioral: Negative for confusion and sleep disturbance.       Objective:   Physical Exam  Constitutional: She appears well-developed and well-nourished. No distress.       Coughing   HENT:  Head: Normocephalic.  Right Ear: External ear normal.  Left Ear: External ear normal.  Nose: Nose normal.  Mouth/Throat: Oropharynx is clear and moist.  Eyes: Conjunctivae are normal. Pupils are equal, round, and reactive to light. Right eye exhibits no discharge. Left eye exhibits no discharge.  Neck: Normal range of motion. Neck supple. No JVD present. No tracheal deviation present. No thyromegaly present.  Cardiovascular: Normal rate, regular rhythm and normal heart sounds.   Pulmonary/Chest: No stridor. No respiratory distress. She has wheezes.  Abdominal: Soft. Bowel sounds are normal. She exhibits no distension and no mass. There is no tenderness. There is no rebound and no guarding.  Musculoskeletal: She exhibits no edema and no tenderness.  Lymphadenopathy:    She has no  cervical adenopathy.  Neurological: She displays normal reflexes. No cranial nerve deficit. She exhibits normal muscle tone. Coordination normal.  Skin: No rash noted. No erythema.  Psychiatric: She has a normal mood and affect. Her behavior is normal. Judgment and thought content normal.    Lab Results  Component Value Date   WBC 7.5 01/17/2011   HGB 14.1 01/17/2011   HCT 42.4 01/17/2011   PLT 336 01/17/2011   GLUCOSE 110* 01/17/2011   CHOL 205* 01/19/2010   TRIG 83.0 01/19/2010   HDL 49.00 01/19/2010   LDLDIRECT 138.6 01/19/2010   LDLCALC 97 11/08/2008   ALT 11 01/17/2011   AST 17 01/17/2011   NA 141 01/17/2011   K 4.4 01/17/2011   CL 103 01/17/2011   CREATININE 0.88 01/17/2011   BUN 16 01/17/2011   CO2 32 01/17/2011   TSH 1.01 01/19/2010   INR 1.02 01/17/2011   HGBA1C  Value: 6.2 (NOTE) The ADA recommends the following therapeutic goal for glycemic control related to Hgb A1c measurement: Goal of therapy: <6.5 Hgb A1c  Reference: American Diabetes Association: Clinical Practice Recommendations 2010, Diabetes Care, 2010, 33: (Suppl  1).* 10/31/2008   Bronchosc and x rays reviewed  I personally provided the inhaler use teaching. After the teaching patient was able to demonstrate it's use effectively. All questions were answered       Assessment & Plan:

## 2011-03-28 NOTE — Assessment & Plan Note (Signed)
Refractory - h/o passive smoking; COPD Will try Daliresp Added Spiriva

## 2011-03-29 ENCOUNTER — Encounter: Payer: Self-pay | Admitting: Internal Medicine

## 2011-04-10 ENCOUNTER — Other Ambulatory Visit: Payer: Self-pay | Admitting: Thoracic Surgery

## 2011-04-10 DIAGNOSIS — D381 Neoplasm of uncertain behavior of trachea, bronchus and lung: Secondary | ICD-10-CM

## 2011-05-09 ENCOUNTER — Telehealth: Payer: Self-pay | Admitting: Thoracic Surgery

## 2011-05-09 ENCOUNTER — Telehealth: Payer: Self-pay

## 2011-05-09 NOTE — Telephone Encounter (Signed)
OK if not allergic to eggs Thx

## 2011-05-09 NOTE — Telephone Encounter (Signed)
Pt called requesting advisement from MD, would she be safe to take a flu vaccination with her current allergies? Please advise.

## 2011-05-09 NOTE — Telephone Encounter (Signed)
The patient canceled her appointment for a followup CT scan. The recommended that she get the CT scan the cancer left lower lobe mass. She says she will call back when her finances are improved. We strongly suggested that she followup as soon as possible. We will be happy to see her she decides to come back and get her CT scan.

## 2011-05-10 NOTE — Telephone Encounter (Signed)
Pt advised and will preferred to go to local pharmacy

## 2011-05-16 ENCOUNTER — Other Ambulatory Visit: Payer: Medicare Other

## 2011-05-16 ENCOUNTER — Ambulatory Visit: Payer: Medicare Other | Admitting: Thoracic Surgery

## 2011-05-30 ENCOUNTER — Ambulatory Visit: Payer: Medicare Other | Admitting: Internal Medicine

## 2011-06-07 ENCOUNTER — Other Ambulatory Visit: Payer: Self-pay | Admitting: *Deleted

## 2011-06-07 MED ORDER — DILTIAZEM HCL 120 MG PO TABS: 120.0000 mg | ORAL_TABLET | Freq: Two times a day (BID) | ORAL | Status: DC

## 2011-06-07 MED ORDER — BUDESONIDE-FORMOTEROL FUMARATE 160-4.5 MCG/ACT IN AERO: 1.0000 | INHALATION_SPRAY | Freq: Two times a day (BID) | RESPIRATORY_TRACT | Status: DC

## 2011-07-23 ENCOUNTER — Encounter: Payer: Self-pay | Admitting: Endocrinology

## 2011-07-23 ENCOUNTER — Ambulatory Visit (INDEPENDENT_AMBULATORY_CARE_PROVIDER_SITE_OTHER): Payer: Medicare Other | Admitting: Endocrinology

## 2011-07-23 VITALS — BP 138/82 | HR 94 | Temp 98.1°F | Ht 63.0 in | Wt 138.8 lb

## 2011-07-23 DIAGNOSIS — R21 Rash and other nonspecific skin eruption: Secondary | ICD-10-CM

## 2011-07-23 MED ORDER — TRIAMCINOLONE ACETONIDE 0.1 % EX CREA: TOPICAL_CREAM | Freq: Three times a day (TID) | CUTANEOUS | Status: DC

## 2011-07-23 MED ORDER — METHYLPREDNISOLONE (PAK) 4 MG PO TABS: ORAL_TABLET | ORAL | Status: AC

## 2011-07-23 MED ORDER — DOXYCYCLINE HYCLATE 100 MG PO TABS: 100.0000 mg | ORAL_TABLET | Freq: Two times a day (BID) | ORAL | Status: AC

## 2011-07-23 NOTE — Patient Instructions (Addendum)
i have sent 3 prescriptions to your pharmacy: antibiotic, steroid cream, and steroid "pack." I hope you feel better soon.  If you don't feel better by next week, please call back.

## 2011-07-23 NOTE — Progress Notes (Signed)
Subjective:    Patient ID: [name], female    DOB: 06/11/1940, 71 y.o.   MRN: [id number]  HPI Pt states few weeks of intermittent moderate rash on the forearms, and assoc itching.   Past Medical History  Diagnosis Date  . Anxiety   . Asthma   . HTN (hypertension)   . Allergic rhinitis     Past Surgical History  Procedure Date  . Tubal ligation     History   Social History  . Marital Status: Married    Spouse Name: N/A    Number of Children: 4  . Years of Education: N/A   Occupational History  . Bank Teller    Social History Main Topics  . Smoking status: Never Smoker   . Smokeless tobacco: Never Used   Comment: father smoked, worked w/smokers  . Alcohol Use: No  . Drug Use: No  . Sexually Active: Not on file   Other Topics Concern  . Not on file   Social History Narrative   Regular exercise - YES    Current Outpatient Prescriptions on File Prior to Visit  Medication Sig Dispense Refill  . acetaminophen (TYLENOL) 325 MG tablet Per bottle as needed       . albuterol (PROAIR HFA) 108 (90 BASE) MCG/ACT inhaler Inhale 2 puffs into the lungs every 4 (four) hours as needed. For shortness of breath       . Azelastine-Fluticasone (DYMISTA) 137-50 MCG/ACT SUSP Place 1 Act into the nose 1 day or 1 dose.  23 g  5  . budesonide-formoterol (SYMBICORT) 160-4.5 MCG/ACT inhaler Inhale 1 puff into the lungs 2 (two) times daily.  10.2 g  5  . Cholecalciferol 1000 UNITS tablet Take 1,000 Units by mouth daily.        Marland Kitchen diltiazem (CARDIZEM) 120 MG tablet Take 1 tablet (120 mg total) by mouth 2 (two) times daily.  60 tablet  5  . loratadine (CLARITIN) 10 MG tablet Take 1 tablet (10 mg total) by mouth daily. As needed for allergies  30 tablet  11  . LORazepam (ATIVAN) 1 MG tablet Take 1 mg by mouth 2 (two) times daily as needed. For anxiety       . Multiple Vitamin (MULTIVITAMIN) tablet Take 1 tablet by mouth daily.        . promethazine-codeine (PHENERGAN WITH CODEINE) 6.25-10  MG/5ML syrup Take 5 mLs by mouth every 6 (six) hours as needed for cough.  480 mL  0  . roflumilast (DALIRESP) 500 MCG TABS tablet Take 1 tablet (500 mcg total) by mouth daily.  30 tablet  11    Allergies  Allergen Reactions  . Penicillins Swelling    Throat swelling  . Sulfonamide Derivatives Swelling    Throat swelling  . Aspirin Other (See Comments)    States stomach bubbles, becomes gaseous and irritated  . Advair Hfa     REACTION: hoarseness  . Clindamycin     REACTION: Neck, tongue swelling, SOB \\T \ rash  . Montelukast Sodium     REACTION: hallucination    Family History  Problem Relation Age of Onset  . Diabetes Father   . Mental illness Mother     alzheimer's  . Asthma Father   . Allergies Father   . Allergies Brother   . Allergies Brother   . Heart disease Maternal Aunt     BP 138/82  Pulse 94  Temp(Src) 98.1 F (36.7 C) (Oral)  Ht 5\' 3"  (1.6 m)  Wt 138 lb 12.8 oz (62.959 kg)  BMI 24.59 kg/m2  SpO2 90%  Review of Systems She has a few weeks of prod cough and wheezing.  No fever.    Objective:   Physical Exam VITAL SIGNS:  See vs page GENERAL: no distress LUNGS:  Clear to auscultation, except for a few exp wheezes. Skin:  Slight eczematous rash on the forearms.       Assessment & Plan:  Rash, new, uncertain etiology URI, new Wheezing, due to the [facility]

## 2011-08-14 ENCOUNTER — Ambulatory Visit: Payer: Medicare Other | Admitting: Thoracic Surgery

## 2011-10-06 ENCOUNTER — Emergency Department (HOSPITAL_COMMUNITY)
Admission: EM | Admit: 2011-10-06 | Discharge: 2011-10-06 | Disposition: A | Discharge status: Home / Self Care | Payer: Medicare Other | Attending: Emergency Medicine | Admitting: Emergency Medicine

## 2011-10-06 ENCOUNTER — Emergency Department (HOSPITAL_COMMUNITY): Payer: Medicare Other

## 2011-10-06 ENCOUNTER — Encounter (HOSPITAL_COMMUNITY): Payer: Self-pay | Admitting: *Deleted

## 2011-10-06 DIAGNOSIS — R05 Cough: Secondary | ICD-10-CM | POA: Insufficient documentation

## 2011-10-06 DIAGNOSIS — R059 Cough, unspecified: Secondary | ICD-10-CM | POA: Insufficient documentation

## 2011-10-06 DIAGNOSIS — J45909 Unspecified asthma, uncomplicated: Secondary | ICD-10-CM | POA: Insufficient documentation

## 2011-10-06 DIAGNOSIS — R0602 Shortness of breath: Secondary | ICD-10-CM | POA: Insufficient documentation

## 2011-10-06 DIAGNOSIS — I10 Essential (primary) hypertension: Secondary | ICD-10-CM | POA: Insufficient documentation

## 2011-10-06 LAB — BASIC METABOLIC PANEL
BUN: 19 mg/dL (ref 6–23)
Creatinine, Ser: 0.8 mg/dL (ref 0.50–1.10)
GFR calc Af Amer: 85 mL/min — ABNORMAL LOW (ref >90)
GFR calc non Af Amer: 73 mL/min — ABNORMAL LOW (ref >90)

## 2011-10-06 LAB — DIFFERENTIAL
Basophils Relative: 1 % (ref 0–1)
Eosinophils Absolute: 0.6 10*3/uL (ref 0.0–0.7)
Monocytes Absolute: 0.7 10*3/uL (ref 0.1–1.0)
Monocytes Relative: 9 % (ref 3–12)
Neutrophils Relative %: 44 % (ref 43–77)

## 2011-10-06 LAB — CBC
HCT: 43.3 % (ref 36.0–46.0)
Hemoglobin: 14.4 g/dL (ref 12.0–15.0)
MCH: 29.3 pg (ref 26.0–34.0)
MCHC: 33.3 g/dL (ref 30.0–36.0)

## 2011-10-06 MED ORDER — ALBUTEROL (5 MG/ML) CONTINUOUS INHALATION SOLN
INHALATION_SOLUTION | RESPIRATORY_TRACT | Status: AC
  Filled 2011-10-06: qty 20

## 2011-10-06 MED ORDER — ALBUTEROL SULFATE (5 MG/ML) 0.5% IN NEBU
15.0000 mg | INHALATION_SOLUTION | Freq: Once | RESPIRATORY_TRACT | Status: AC
  Administered 2011-10-06: 15 mg via RESPIRATORY_TRACT
  Filled 2011-10-06: qty 0.5
  Filled 2011-10-06: qty 3

## 2011-10-06 MED ORDER — PREDNISONE 20 MG PO TABS
60.0000 mg | ORAL_TABLET | Freq: Once | ORAL | Status: AC
  Administered 2011-10-06: 60 mg via ORAL
  Filled 2011-10-06: qty 3

## 2011-10-06 MED ORDER — PREDNISONE 10 MG PO TABS: 20.0000 mg | ORAL_TABLET | Freq: Every day | ORAL | Status: DC

## 2011-10-06 MED ORDER — IPRATROPIUM BROMIDE 0.02 % IN SOLN
1.0000 mg | Freq: Once | RESPIRATORY_TRACT | Status: AC
  Administered 2011-10-06: 1 mg via RESPIRATORY_TRACT
  Filled 2011-10-06: qty 2.5

## 2011-10-06 NOTE — ED Provider Notes (Signed)
History     CSN: [id number]  Arrival date & time 10/06/11  1635   First MD Initiated Contact with Patient 10/06/11 2003      Chief Complaint  Patient presents with  . Shortness of Breath    HPI  History provided by the patient. Patient is a 71 year old female with history of hypertension and asthma who presents with complaints of shortness of breath symptoms for the past several days. Symptoms are similar to previous asthma symptoms. Patient states that she usually does pretty good with her Symbicort treatments but she has been using this with increased frequency over the past 3 days without significant change. Patient states she feels her symptoms are worse because of the weather recently. She reports similar symptoms often in the past. She states that she has been put on a dose of steroids in the past that usually makes her feel back to normal for quite a while. Patient also reports having a typical cough that is sometimes productive. Patient also feels improvements when she is able to cough up sputum. She denies having any fever, chills or sweats. She denies having any chest pain or heart palpitations.    Past Medical History  Diagnosis Date  . Anxiety   . Asthma   . HTN (hypertension)   . Allergic rhinitis     Past Surgical History  Procedure Date  . Tubal ligation     Family History  Problem Relation Age of Onset  . Diabetes Father   . Mental illness Mother     alzheimer's  . Asthma Father   . Allergies Father   . Allergies Brother   . Allergies Brother   . Heart disease Maternal Aunt     History  Substance Use Topics  . Smoking status: Never Smoker   . Smokeless tobacco: Never Used   Comment: father smoked, worked w/smokers  . Alcohol Use: No    OB History    Grav Para Term Preterm Abortions TAB SAB Ect Mult Living                  Review of Systems  Constitutional: Negative for fever, chills, diaphoresis and fatigue.  HENT: Negative for congestion and  rhinorrhea.   Respiratory: Positive for cough, shortness of breath and wheezing.   Cardiovascular: Negative for chest pain and palpitations.  Gastrointestinal: Negative for nausea, vomiting and abdominal pain.    Allergies  Penicillins; Sulfonamide derivatives; Aspirin; Clindamycin; Fluticasone-salmeterol; and Montelukast sodium  Home Medications   Current Outpatient Rx  Name Route Sig Dispense Refill  . ACETAMINOPHEN 325 MG PO TABS Oral Take 325 mg by mouth every 6 (six) hours as needed. For pain    . AZELASTINE-FLUTICASONE 137-50 MCG/ACT NA SUSP Nasal Place 1 Act into the nose 1 day or 1 dose.    . BUDESONIDE-FORMOTEROL FUMARATE 160-4.5 MCG/ACT IN AERO Inhalation Inhale 1 puff into the lungs 2 (two) times daily.    . CHOLECALCIFEROL 1000 UNITS PO TABS Oral Take 1,000 Units by mouth daily.      Marland Kitchen DILTIAZEM HCL 120 MG PO TABS Oral Take 120 mg by mouth 2 (two) times daily.    Marland Kitchen LORATADINE 10 MG PO TABS Oral Take 10 mg by mouth daily. As needed for allergies    . ONE-DAILY MULTI VITAMINS PO TABS Oral Take 1 tablet by mouth daily.      . TRIAMCINOLONE ACETONIDE 0.1 % EX CREA Topical Apply topically 3 (three) times daily. As needed for rash    .  ALBUTEROL SULFATE HFA 108 (90 BASE) MCG/ACT IN AERS Inhalation Inhale 2 puffs into the lungs every 4 (four) hours as needed. For shortness of breath    . PROMETHAZINE-CODEINE 6.25-10 MG/5ML PO SYRP Oral Take 5 mLs by mouth every 6 (six) hours as needed.    . ROFLUMILAST 500 MCG PO TABS Oral Take 500 mcg by mouth daily.      BP 172/83  Pulse 75  Temp(Src) 97.2 F (36.2 C) (Oral)  Resp 20  SpO2 93%  Physical Exam  Nursing note and vitals reviewed. Constitutional: She is oriented to person, place, and time. She appears well-developed and well-nourished. No distress.  HENT:  Head: Normocephalic and atraumatic.  Mouth/Throat: Oropharynx is clear and moist.  Cardiovascular: Normal rate and regular rhythm.   Pulmonary/Chest: Effort normal. No  respiratory distress. She has wheezes. She has no rales. She exhibits no tenderness.  Abdominal: Soft. She exhibits no distension. There is no tenderness. There is no rebound.  Musculoskeletal: She exhibits no edema and no tenderness.  Neurological: She is alert and oriented to person, place, and time.  Skin: Skin is warm and dry.  Psychiatric: She has a normal mood and affect. Her behavior is normal.    ED Course  Procedures  Results for orders placed during the hospital encounter of 10/06/11  CBC      Component Value Range   WBC 7.6  4.0 - 10.5 (K/uL)   RBC 4.91  3.87 - 5.11 (MIL/uL)   Hemoglobin 14.4  12.0 - 15.0 (g/dL)   HCT 16.1  09.6 - 04.5 (%)   MCV 88.2  78.0 - 100.0 (fL)   MCH 29.3  26.0 - 34.0 (pg)   MCHC 33.3  30.0 - 36.0 (g/dL)   RDW 40.9  81.1 - 91.4 (%)   Platelets 307  150 - 400 (K/uL)  DIFFERENTIAL      Component Value Range   Neutrophils Relative 44  43 - 77 (%)   Neutro Abs 3.3  1.7 - 7.7 (K/uL)   Lymphocytes Relative 38  12 - 46 (%)   Lymphs Abs 2.9  0.7 - 4.0 (K/uL)   Monocytes Relative 9  3 - 12 (%)   Monocytes Absolute 0.7  0.1 - 1.0 (K/uL)   Eosinophils Relative 8 (*) 0 - 5 (%)   Eosinophils Absolute 0.6  0.0 - 0.7 (K/uL)   Basophils Relative 1  0 - 1 (%)   Basophils Absolute 0.1  0.0 - 0.1 (K/uL)  BASIC METABOLIC PANEL      Component Value Range   Sodium 139  135 - 145 (mEq/L)   Potassium 4.0  3.5 - 5.1 (mEq/L)   Chloride 102  96 - 112 (mEq/L)   CO2 28  19 - 32 (mEq/L)   Glucose, Bld 96  70 - 99 (mg/dL)   BUN 19  6 - 23 (mg/dL)   Creatinine, Ser 7.82  0.50 - 1.10 (mg/dL)   Calcium 9.3  8.4 - 95.6 (mg/dL)   GFR calc non Af Amer 73 (*) >90 (mL/min)   GFR calc Af Amer 85 (*) >90 (mL/min)      Dg Chest 2 View  10/06/2011  *RADIOLOGY REPORT*  Clinical Data: Shortness of breath, wheezing, cough  CHEST - 2 VIEW  Comparison: 11/30/2010 and 01/24/2011  Findings: Cardiomediastinal silhouette is stable.  Mild infrahilar bronchial prominence again noted.   There is a cavitary lesion in the left lower lobe retrocardiac region measures at least 2 cm. Further evaluation  with enhanced CT scan of the chest is recommended.  No acute infiltrate or pulmonary edema.  IMPRESSION: No acute infiltrate or pulmonary edema.  There is a cavitary lesion in the left lower lobe retrocardiac region measures at least 2 cm. Further evaluation with enhanced CT of the chest is recommended.  Original Report Authenticated By: Natasha Mead, M.D.     1. Asthma       MDM   8:05PM Patient seen and evaluated. Patient no acute distress.  Patient with audible wheezing on exam. Patient states symptoms are similar to prior asthma and shortness of breath symptoms. I discussed with patient x-ray findings and lesion in the left lung. Patient states that she has been told of this lesion in the past. Patient states that she has had 2 bronchoscopies for this reason by Dr. Maple Hudson. She states that there has been no diagnosis of what causes the lesion. I discussed with patient option for a CT scan today and she does not wish to have a CT. I explained the benefits, risks and alternatives and the patient expressed her understanding and wishes to discuss this with her doctors for followup.   Patient is not having any fever, chills or sweats. At this time I have low clinical suspicion for pneumonia. Will check a basic CBC and BMP. We'll also provide breathing treatment and dose of steroid for patient.  Patient now feeling much better after breathing treatment. Wheezing has improved significantly. Respiratory therapist also reports good improvements of peak flow. This time patient requesting return home. Will give prescription for prednisone. She will followup with her doctors on Monday.      Angus Seller, [place] 10/07/11 [phone]

## 2011-10-06 NOTE — ED Notes (Signed)
PETER PA AT BEDSIDE

## 2011-10-06 NOTE — ED Notes (Signed)
Trouble breathing for x 3 days. No acute distress. Getting better now since she has arrived.

## 2011-10-06 NOTE — Discharge Instructions (Signed)
You were seen and evaluated for your complaints of asthma shortness of breath. Your chest x-ray today showed a small area of concern in your left lung behind the heart. Your providers today discussed options for CAT scan for additional evaluation. You stated that you were familiar with this spot and the your lung doctors are aware of this and have evaluated you for this in the past. Please continue to discuss this with your doctors for additional evaluation and followup. Please continue your medications for your asthma. Return to the emergency room for any worsening symptoms or increase shortness of breath.   Asthma, Adult Asthma is caused by narrowing of the air passages in the lungs. It may be triggered by pollen, dust, animal dander, molds, some foods, respiratory infections, exposure to smoke, exercise, emotional stress or other allergens (things that cause allergic reactions or allergies). Repeat attacks are common. HOME CARE INSTRUCTIONS   Use prescription medications as ordered by your caregiver.   Avoid pollen, dust, animal dander, molds, smoke and other things that cause attacks at home and at work.   You may have fewer attacks if you decrease dust in your home. Electrostatic air cleaners may help.   It may help to replace your pillows or mattress with materials less likely to cause allergies.   Talk to your caregiver about an action plan for managing asthma attacks at home, including, the use of a peak flow meter which measures the severity of your asthma attack. An action plan can help minimize or stop the attack without having to seek medical care.   If you are not on a fluid restriction, drink 8 to 10 glasses of water each day.   Always have a plan prepared for seeking medical attention, including, calling your physician, accessing local emergency care, and calling 911 (in the [place] for a severe attack.   Discuss possible exercise routines with your caregiver.   If animal dander  is the cause of asthma, you may need to get rid of pets.  SEEK MEDICAL CARE IF:   You have wheezing and shortness of breath even if taking medicine to prevent attacks.   You have muscle aches, chest pain or thickening of sputum.   Your sputum changes from clear or white to yellow, green, gray, or bloody.   You have any problems that may be related to the medicine you are taking (such as a rash, itching, swelling or trouble breathing).  SEEK IMMEDIATE MEDICAL CARE IF:   Your usual medicines do not stop your wheezing or there is increased coughing and/or shortness of breath.   You have increased difficulty breathing.   You have a fever.  MAKE SURE YOU:   Understand these instructions.   Will watch your condition.   Will get help right away if you are not doing well or get worse.  Document Released: 04/16/2005 Document Revised: 04/05/2011 Document Reviewed: 12/03/2007 [facility] Patient Information 2012 [place], [place].

## 2011-10-06 NOTE — ED Notes (Signed)
respiratory  at bedside for continuous treatment

## 2011-10-06 NOTE — ED Notes (Signed)
Pt discharged via teach back. 

## 2011-10-06 NOTE — ED Notes (Signed)
Resp paged for breathing tx

## 2011-10-06 NOTE — ED Notes (Signed)
Pt states unable to take deep breath in. Rates SOB 5/10.

## 2011-10-07 NOTE — ED Provider Notes (Signed)
Medical screening examination/treatment/procedure(s) were performed by non-physician practitioner and as supervising physician I was immediately available for consultation/collaboration.   Carleene Cooper III, MD 10/07/11 1345

## 2011-11-14 ENCOUNTER — Telehealth: Payer: Self-pay | Admitting: Internal Medicine

## 2011-11-14 MED ORDER — PREDNISONE 10 MG PO TABS: ORAL_TABLET | ORAL | Status: DC

## 2011-11-14 NOTE — Telephone Encounter (Signed)
Pt is aware of MW recs. I have sent rx in. Katie where can pt be worked in at on [employer] schedule. Please advise thanks

## 2011-11-14 NOTE — Telephone Encounter (Signed)
Prednisone 10 mg take  4 each am x 2 days,   2 each am x 2 days,  1 each am x2days and stop   Must be seen by end of next week but go to ER in meantime if not improving on rx

## 2011-11-14 NOTE — Telephone Encounter (Signed)
Spoke with patient-states she has been having increased SOB x 3 days and has begun wheezing today. Pt is using all meds as told so and would like to either be seen today by MW or have prednisone Rx called to pharmacy. MW as doc of day please advise. Thanks.

## 2011-11-14 NOTE — Telephone Encounter (Signed)
Friday July 26 at 1130am slot; be here at 1115am.

## 2011-11-14 NOTE — Telephone Encounter (Signed)
Pt aware of appt date and time

## 2011-11-23 ENCOUNTER — Other Ambulatory Visit (INDEPENDENT_AMBULATORY_CARE_PROVIDER_SITE_OTHER): Payer: Medicare Other

## 2011-11-23 ENCOUNTER — Encounter: Payer: Self-pay | Admitting: Internal Medicine

## 2011-11-23 ENCOUNTER — Ambulatory Visit (INDEPENDENT_AMBULATORY_CARE_PROVIDER_SITE_OTHER): Payer: Medicare Other | Admitting: Internal Medicine

## 2011-11-23 VITALS — BP 128/72 | HR 98 | Ht 63.0 in | Wt 131.4 lb

## 2011-11-23 DIAGNOSIS — R911 Solitary pulmonary nodule: Secondary | ICD-10-CM

## 2011-11-23 DIAGNOSIS — R918 Other nonspecific abnormal finding of lung field: Secondary | ICD-10-CM

## 2011-11-23 DIAGNOSIS — R222 Localized swelling, mass and lump, trunk: Secondary | ICD-10-CM

## 2011-11-23 LAB — BASIC METABOLIC PANEL
Calcium: 9.3 mg/dL (ref 8.4–10.5)
Creatinine, Ser: 0.9 mg/dL (ref 0.4–1.2)

## 2011-11-23 MED ORDER — PROMETHAZINE-CODEINE 6.25-10 MG/5ML PO SYRP: 5.0000 mL | ORAL_SOLUTION | Freq: Four times a day (QID) | ORAL | Status: DC | PRN

## 2011-11-23 NOTE — Patient Instructions (Addendum)
Order- Schedule CT chest with contrast- dx lung nodule, compare with CT 2012              Lab- BMET for renal function

## 2011-11-23 NOTE — Progress Notes (Signed)
Subjective:    Patient ID: [name], female    DOB: [birth year], 71 y.o.   MRN: [id number]  HPI 10/04/10-71 yoF never smoker with hx of asthma, left hilar mass/ PET negative.. Last here in 2008 after hospital stay, when she saw [name] noting issues of hypertension and asthma meds. Notes reviewed. Seen now on referral by [name] after recent flare with shortness of breath, productive cough. She tends to feel better as long as she is on an antibiotic or steroids. Within a couple of weeks off, she will start to cough and get tight again. Had bronchitis as a child. In the last 5-6 years wheezing dyspnea has been more perennial. Occasionally wakes with cough. Sputum often yellow. Has had pneumonia x 2 with pneumovax. Little GERD or acid indigestion recognized. Triggers have included strong odors, seasonal pollens, chest colds, but not house dust. Had a sinus infection during the winter. Has been treated for heart rhythm and BP, but denies MI/ angina. She is using Symbicort 160 now and considers it sufficient used twice every day. She did not fill a script for a Proair rescue inhaler. 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  11/10/10- 71 yoF never smoker with hx of asthma, left hilar mass/ PET neg in 2008/ now progressive, RML atelectasis. CT 11/03/10- showed change progression since prior studies of 2008, when abnormalities / PET negative, were attributed to scarring. -Now 2.6 x 2,2 cm left hilar mass, slowly growing. Post obstructive LLL opacity. New RML collapse. Scattered bronchiectasis. No effusion. Denies fever, night sweat, nodes. Cough has been a little less, with brownish to clear mucus, no blood. Has to chew deliberately to avoid choking. Has rarely awakened with hard cough. Symbicort has helped cough. No weight loss. No chest pain, but some shifting back pain. We reviewed her images and the technique and risks of bronchoscopy.   12/12/10- 71 yoF never smoker with hx of asthma, left hilar mass/ PET  neg in 2008/ now progressive, RML atelectasis. PET neg 2012. Husband here  Post bronchoscopy f/u - Bronch 11/30/10- necrotic appearing mass occluding left lower lobe bronchus.  Path- small lymphocytes and eosinophils, neg for malignancy or granuloma. Cultures negative so far.  She still coughs up phlegm, but less than before bronchoscopy.  Aware of a variable nodule in left axilla- comes and goes, otherwise no lumps, nodes or etc. Occasional sweat on morning waking, otherwise no fever or sweat and no pain or blood. She has been aware "something is going on" but not toxic.   11/23/11-  71 yoF never smoker with hx of asthma, left hilar mass/ PET neg in 2008/ now progressive, RML atelectasis. PET neg 2012.  She says she is doing "fair". She has had bronchoscopy/ [name] after me ."Benign inflammatory"  Coughs more with stress. She had gone to emergency room June 8 with anxiety and shortness of breath. We subsequently called in prednisone taper. She denies fever, sweat, adenopathy or chest pain. Often short of breath if anxious. She avoids exertion such as climbing stairs. Prednisone helped cough more productive and she feels better having taken it. Phlegm now is yellow, sometimes darker with no blood. She says that "a bug bite escalates my anxiety". Lab-eosinophils 8%, always high. CXR 10/06/11 IMPRESSION:  No acute infiltrate or pulmonary edema. There is a cavitary lesion  in the left lower lobe retrocardiac region measures at least 2 cm.  Further evaluation with enhanced CT of the chest is recommended.  Original Report Authenticated By: [name]  POP, M.D.   ROS-see HPI Constitutional:   No-   weight loss, night sweats, fevers, chills, fatigue, lassitude. HEENT:   No-  headaches, difficulty swallowing, tooth/dental problems, sore throat,       No-  sneezing, itching, ear ache, nasal congestion, post nasal drip,  CV:  No-   chest pain, orthopnea, PND, swelling in lower extremities, anasarca,  dizziness,  palpitations Resp: +   shortness of breath with exertion or at rest.              +  productive cough,  + non-productive cough,  No- coughing up of blood.              No-   change in color of mucus.  No- wheezing.   Skin: No-   rash or lesions. GI:  No-   heartburn, indigestion, abdominal pain, nausea, vomiting,  GU: . MS:  No-   joint pain or swelling.   Neuro-     nothing unusual Psych:  No- change in mood or affect. No depression or anxiety.  No memory loss.  OBJ- Physical Exam General- Alert, Oriented, Affect-appropriate, Distress- none acute Skin- rash-none, lesions- none, excoriation- none Lymphadenopathy- none Head- atraumatic            Eyes- Gross vision intact, PERRLA, conjunctivae and secretions clear            Ears- Hearing, canals-normal            Nose- Clear, no-Septal dev, mucus, polyps, erosion, perforation             Throat- Mallampati II , mucosa clear , drainage- none, tonsils- atrophic Neck- flexible , trachea midline, no stridor , thyroid nl, carotid no bruit Chest - symmetrical excursion , unlabored           Heart/CV- RRR , no murmur , no gallop  , no rub, nl s1 s2                           - JVD- none , edema- none, stasis changes- none, varices- none           Lung- clear to P&A, wheeze- none, cough- none , dullness-none, rub- none           Chest wall-  Abd- Br/ Gen/ Rectal- Not done, not indicated Extrem- cyanosis- none, clubbing, none, atrophy- none, strength- nl Neuro- grossly intact to observation

## 2011-11-28 ENCOUNTER — Other Ambulatory Visit: Payer: Medicare Other

## 2011-11-29 NOTE — Assessment & Plan Note (Signed)
Pathology has always returned benign, looking like necrotic debris and impacted mucus. Referral to thoracic surgery/Dr Edwyna Shell led to repeat bronchoscopy in the past, but no clarification of diagnosis. Plan-chest CT with contrast to evaluate for progression or clearing

## 2011-12-21 ENCOUNTER — Ambulatory Visit (INDEPENDENT_AMBULATORY_CARE_PROVIDER_SITE_OTHER)
Admission: RE | Admit: 2011-12-21 | Discharge: 2011-12-21 | Disposition: A | Discharge status: Home / Self Care | Payer: Medicare Other | Source: Ambulatory Visit | Attending: Internal Medicine | Admitting: Internal Medicine

## 2011-12-21 DIAGNOSIS — R911 Solitary pulmonary nodule: Secondary | ICD-10-CM

## 2011-12-21 MED ORDER — IOHEXOL 300 MG/ML  SOLN
80.0000 mL | Freq: Once | INTRAMUSCULAR | Status: AC | PRN
  Administered 2011-12-21: 80 mL via INTRAVENOUS

## 2011-12-25 ENCOUNTER — Ambulatory Visit (INDEPENDENT_AMBULATORY_CARE_PROVIDER_SITE_OTHER): Payer: Medicare Other | Admitting: Internal Medicine

## 2011-12-25 ENCOUNTER — Encounter: Payer: Self-pay | Admitting: Internal Medicine

## 2011-12-25 VITALS — BP 158/86 | HR 89 | Ht 63.0 in | Wt 133.6 lb

## 2011-12-25 DIAGNOSIS — R222 Localized swelling, mass and lump, trunk: Secondary | ICD-10-CM

## 2011-12-25 DIAGNOSIS — R918 Other nonspecific abnormal finding of lung field: Secondary | ICD-10-CM

## 2011-12-25 MED ORDER — FLUTTER DEVI: 1.0000 | Freq: Four times a day (QID) | Status: AC

## 2011-12-25 NOTE — Patient Instructions (Addendum)
Order- Flutter - blow through 4 times per set, three sets per day  To see if this will help you clear mucus from your airways.  Please call as needed

## 2011-12-25 NOTE — Progress Notes (Signed)
Quick Note:  Pt aware of results via OV on 12-25-11 ______

## 2011-12-25 NOTE — Progress Notes (Signed)
Subjective:    Patient ID: [name], female    DOB: [birth year], 71 y.o.   MRN: [id number]  HPI 10/04/10-69 yoF never smoker with hx of asthma, left hilar mass/ PET negative.. Last here in 2008 after hospital stay, when she saw [name] noting issues of hypertension and asthma meds. Notes reviewed. Seen now on referral by [name] after recent flare with shortness of breath, productive cough. She tends to feel better as long as she is on an antibiotic or steroids. Within a couple of weeks off, she will start to cough and get tight again. Had bronchitis as a child. In the last 5-6 years wheezing dyspnea has been more perennial. Occasionally wakes with cough. Sputum often yellow. Has had pneumonia x 2 with pneumovax. Little GERD or acid indigestion recognized. Triggers have included strong odors, seasonal pollens, chest colds, but not house dust. Had a sinus infection during the winter. Has been treated for heart rhythm and BP, but denies MI/ angina. She is using Symbicort 160 now and considers it sufficient used twice every day. She did not fill a script for a Proair rescue inhaler. 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  11/10/10- 69 yoF never smoker with hx of asthma, left hilar mass/ PET neg in 2008/ now progressive, RML atelectasis. CT 11/03/10- showed change progression since prior studies of 2008, when abnormalities / PET negative, were attributed to scarring. -Now 2.6 x 2,2 cm left hilar mass, slowly growing. Post obstructive LLL opacity. New RML collapse. Scattered bronchiectasis. No effusion. Denies fever, night sweat, nodes. Cough has been a little less, with brownish to clear mucus, no blood. Has to chew deliberately to avoid choking. Has rarely awakened with hard cough. Symbicort has helped cough. No weight loss. No chest pain, but some shifting back pain. We reviewed her images and the technique and risks of bronchoscopy.   12/12/10- 69 yoF never smoker with hx of asthma, left hilar mass/ PET  neg in 2008/ now progressive, RML atelectasis. PET neg 2012. Husband here  Post bronchoscopy f/u - Bronch 11/30/10- necrotic appearing mass occluding left lower lobe bronchus.  Path- small lymphocytes and eosinophils, neg for malignancy or granuloma. Cultures negative so far.  She still coughs up phlegm, but less than before bronchoscopy.  Aware of a variable nodule in left axilla- comes and goes, otherwise no lumps, nodes or etc. Occasional sweat on morning waking, otherwise no fever or sweat and no pain or blood. She has been aware "something is going on" but not toxic.   11/23/11-  25 yoF never smoker with hx of asthma, left hilar mass/ PET neg in 2008/ now progressive, RML atelectasis. PET neg 2012.  She says she is doing "fair". She has had bronchoscopy/ [name] after me ."Benign inflammatory"  Coughs more with stress. She had gone to emergency room June 8 with anxiety and shortness of breath. We subsequently called in prednisone taper. She denies fever, sweat, adenopathy or chest pain. Often short of breath if anxious. She avoids exertion such as climbing stairs. Prednisone helped cough more productive and she feels better having taken it. Phlegm now is yellow, sometimes darker with no blood. She says that "a bug bite escalates my anxiety". Lab-eosinophils 8%, always high. CXR 10/06/11 IMPRESSION:  No acute infiltrate or pulmonary edema. There is a cavitary lesion  in the left lower lobe retrocardiac region measures at least 2 cm.  Further evaluation with enhanced CT of the chest is recommended.  Original Report Authenticated By: [name]  POP, M.D.    12/25/11-  85 yoF never smoker with hx of asthma, left hilar mass/ PET neg in 2008/ now progressive, RML atelectasis. PET neg 2012.  States breathing has gotten better since last ov; review CT with patient in more detail Off prednisone since July 26. Did not need Symbicort. Cough produces some light yellow sputum but she denies fever, sweat, chest  pain. She wakes coughing occasionally. CT chest 12/25/11- images reviewed with her: IMPRESSION:  Interval decrease in size of the left infrahilar lesion which has  become air-filled in the interval. It is possible that this could  represent some central cystic bronchiectasis or decompression of  the previous fluid density nodule.  Interval re-expansion of the right middle lobe.  No new or progressive findings.  Original Report Authenticated By: [name], M.D.    ROS-see HPI Constitutional:   No-   weight loss, night sweats, fevers, chills, fatigue, lassitude. HEENT:   No-  headaches, difficulty swallowing, tooth/dental problems, sore throat,       No-  sneezing, itching, ear ache, nasal congestion, post nasal drip,  CV:  No-   chest pain, orthopnea, PND, swelling in lower extremities, anasarca,  dizziness, palpitations Resp: +   shortness of breath with exertion or at rest.              +  productive cough,  + non-productive cough,  No- coughing up of blood.              +   change in color of mucus.  No- wheezing.   Skin: No-   rash or lesions. GI:  No-   heartburn, indigestion, abdominal pain, nausea, vomiting,  GU: . MS:  No-   joint pain or swelling.   Neuro-     nothing unusual Psych:  No- change in mood or affect. No depression or anxiety.  No memory loss.  OBJ- Physical Exam General- Alert, Oriented, Affect-appropriate, Distress- none acute Skin- rash-none, lesions- none, excoriation- none Lymphadenopathy- none Head- atraumatic            Eyes- Gross vision intact, PERRLA, conjunctivae and secretions clear            Ears- Hearing, canals-normal            Nose- Clear, no-Septal dev, mucus, polyps, erosion, perforation             Throat- Mallampati II , mucosa clear , drainage- none, tonsils- atrophic Neck- flexible , trachea midline, no stridor , thyroid nl, carotid no bruit Chest - symmetrical excursion , unlabored           Heart/CV- RRR , no murmur , no gallop   , no rub, nl s1 s2                           - JVD- none , edema- none, stasis changes- none, varices- none           Lung- + wheeze L>R, cough- none , dullness-none, rub- none           Chest wall-  Abd- Br/ Gen/ Rectal- Not done, not indicated Extrem- cyanosis- none, clubbing, none, atrophy- none, strength- nl Neuro- grossly intact to observation

## 2011-12-31 NOTE — Assessment & Plan Note (Signed)
Improvement on chest CT with expansion of right middle lobe and clearing out of a cystic area at the left hilum may all be a complex bronchiectasis with retained secretions. This would fit with the repeated nondiagnostic bronchoscopies. Plan-resume Symbicort. Use flutter device and stay physically active to mobilize secretions.

## 2012-03-24 ENCOUNTER — Ambulatory Visit (INDEPENDENT_AMBULATORY_CARE_PROVIDER_SITE_OTHER): Payer: Medicare Other | Admitting: Internal Medicine

## 2012-03-24 ENCOUNTER — Telehealth: Payer: Self-pay | Admitting: Internal Medicine

## 2012-03-24 ENCOUNTER — Encounter: Payer: Self-pay | Admitting: Internal Medicine

## 2012-03-24 VITALS — BP 150/88 | HR 80 | Temp 100.6°F | Resp 16 | Wt 128.0 lb

## 2012-03-24 DIAGNOSIS — J069 Acute upper respiratory infection, unspecified: Secondary | ICD-10-CM

## 2012-03-24 DIAGNOSIS — J45909 Unspecified asthma, uncomplicated: Secondary | ICD-10-CM

## 2012-03-24 MED ORDER — DOXYCYCLINE HYCLATE 100 MG PO TABS: 100.0000 mg | ORAL_TABLET | Freq: Two times a day (BID) | ORAL | Status: DC

## 2012-03-24 MED ORDER — HYDROCOD POLST-CHLORPHEN POLST 10-8 MG/5ML PO LQCR: 5.0000 mL | Freq: Two times a day (BID) | ORAL | Status: DC | PRN

## 2012-03-24 NOTE — Telephone Encounter (Signed)
Patient Information:  Caller Name: [name]  Phone: [phone]  Patient: [name]  Gender: Female  DOB: May 02, 1940  Age: 71 Years  PCP: Plotnikov, Alex (Adults only)   Symptoms  Reason For Call & Symptoms: Cough and Ear Congestion  Reviewed Health History In EMR: Yes  Reviewed Medications In EMR: Yes  Reviewed Allergies In EMR: Yes  Date of Onset of Symptoms: 03/17/2012  Treatments Tried: Tea, Vinegar and Honey, Orange Juice.  Treatments Tried Worked: No  Guideline(s) Used:  Cough  Disposition Per Guideline:   See Today in Office  Reason For Disposition Reached:   Known COPD or other severe lung disease (i.e., bronchiectasis, cystic fibrosis, lung surgery) and worsening symptoms (i.e., increased sputum purulence or amount, increased breathing difficulty)  Advice Given:  Prevent Dehydration:  Drink adequate liquids.  This will help soothe an irritated or dry throat and loosen up the phlegm.  Cough Medicines:  Home Remedy - Honey  Expected Course:   The expected course depends on what is causing the cough.  Viral bronchitis (chest cold) causes a cough that lasts 1 to 3 weeks. Sometimes you may cough up lots of phlegm (sputum, mucus). The mucus can normally be white, gray, yellow, or green.  Call Back If:  You become worse.  Office Follow Up:  Does the office need to follow up with this patient?: No  Instructions For The Office: N/A  Appointment Scheduled:  03/24/2012 16:00:00  RN Note:  Cough is productive with Thick Clear sputum.  Appointment scheduled 03/24/12 at 1600 with Dr. Posey Rea.

## 2012-03-24 NOTE — Progress Notes (Signed)
Patient ID: [name], female   DOB: Oct 30, 1940, 71 y.o.   MRN: [id number]  Subjective:    Patient ID: [name], female    DOB: 1940/08/21, 71 y.o.   MRN: [id number]  Cough This is a new problem. The current episode started in the past 7 days. The problem has been waxing and waning. Associated symptoms include a fever, shortness of breath and wheezing. Pertinent negatives include no chills, headaches or rash.  Fever  Associated symptoms include coughing and wheezing. Pertinent negatives include no abdominal pain, congestion, headaches, nausea or rash.    The patient presents for a follow-up of  Chronic asthma/COPD - not better, abn CXR, hypertension, chronic cough - not better; SOB C/o sinus sx's.   Review of Systems  Constitutional: Positive for fever and fatigue. Negative for chills, activity change, appetite change and unexpected weight change.  HENT: Negative for congestion, mouth sores and sinus pressure.   Eyes: Negative for visual disturbance.  Respiratory: Positive for cough, choking, chest tightness, shortness of breath and wheezing.   Gastrointestinal: Negative for nausea and abdominal pain.  Genitourinary: Negative for frequency, difficulty urinating and vaginal pain.  Musculoskeletal: Negative for back pain and gait problem.  Skin: Negative for pallor and rash.  Neurological: Negative for dizziness, tremors, weakness, numbness and headaches.  Psychiatric/Behavioral: Negative for confusion and sleep disturbance.       Objective:   Physical Exam  Constitutional: She appears well-developed and well-nourished. No distress.       Coughing   HENT:  Head: Normocephalic.  Right Ear: External ear normal.  Left Ear: External ear normal.  Nose: Nose normal.  Mouth/Throat: Oropharynx is clear and moist.  Eyes: Conjunctivae normal are normal. Pupils are equal, round, and reactive to light. Right eye exhibits no discharge. Left eye exhibits no discharge.  Neck: Normal  range of motion. Neck supple. No JVD present. No tracheal deviation present. No thyromegaly present.  Cardiovascular: Normal rate, regular rhythm and normal heart sounds.   Pulmonary/Chest: No stridor. No respiratory distress. She has wheezes.  Abdominal: Soft. Bowel sounds are normal. She exhibits no distension and no mass. There is no tenderness. There is no rebound and no guarding.  Musculoskeletal: She exhibits no edema and no tenderness.  Lymphadenopathy:    She has no cervical adenopathy.  Neurological: She displays normal reflexes. No cranial nerve deficit. She exhibits normal muscle tone. Coordination normal.  Skin: No rash noted. No erythema.  Psychiatric: She has a normal mood and affect. Her behavior is normal. Judgment and thought content normal.    Lab Results  Component Value Date   WBC 7.6 10/06/2011   HGB 14.4 10/06/2011   HCT 43.3 10/06/2011   PLT 307 10/06/2011   GLUCOSE 107* 11/23/2011   CHOL 205* 01/19/2010   TRIG 83.0 01/19/2010   HDL 49.00 01/19/2010   LDLDIRECT 138.6 01/19/2010   LDLCALC 97 11/08/2008   ALT 11 01/17/2011   AST 17 01/17/2011   NA 139 11/23/2011   K 4.4 11/23/2011   CL 99 11/23/2011   CREATININE 0.9 11/23/2011   BUN 13 11/23/2011   CO2 31 11/23/2011   TSH 1.01 01/19/2010   INR 1.02 01/17/2011   HGBA1C  Value: 6.2 (NOTE) The ADA recommends the following therapeutic goal for glycemic control related to Hgb A1c measurement: Goal of therapy: <6.5 Hgb A1c  Reference: American Diabetes Association: Clinical Practice Recommendations 2010, Diabetes Care, 2010, 33: (Suppl  1).* 10/31/2008   Bronchosc and x rays reviewed  Assessment & Plan:

## 2012-03-24 NOTE — Patient Instructions (Addendum)
Use over-the-counter  "cold" medicines  such as"Afrin" nasal spray for nasal congestion as directed instead. Use" Delsym" or" Robitussin" cough syrup varietis for cough.  You can use plain "Tylenol" or "Advil' for fever, chills and achyness.   

## 2012-03-26 DIAGNOSIS — J069 Acute upper respiratory infection, unspecified: Secondary | ICD-10-CM | POA: Insufficient documentation

## 2012-03-26 NOTE — Assessment & Plan Note (Signed)
See meds 

## 2012-03-26 NOTE — Assessment & Plan Note (Signed)
Continue with current prescription therapy as reflected on the Med list.  

## 2012-06-03 ENCOUNTER — Other Ambulatory Visit: Payer: Self-pay | Admitting: Internal Medicine

## 2012-06-24 ENCOUNTER — Encounter: Payer: Self-pay | Admitting: Internal Medicine

## 2012-06-24 ENCOUNTER — Other Ambulatory Visit (INDEPENDENT_AMBULATORY_CARE_PROVIDER_SITE_OTHER): Payer: Medicare Other

## 2012-06-24 ENCOUNTER — Ambulatory Visit (INDEPENDENT_AMBULATORY_CARE_PROVIDER_SITE_OTHER): Payer: Medicare Other | Admitting: Internal Medicine

## 2012-06-24 ENCOUNTER — Ambulatory Visit (INDEPENDENT_AMBULATORY_CARE_PROVIDER_SITE_OTHER)
Admission: RE | Admit: 2012-06-24 | Discharge: 2012-06-24 | Disposition: A | Discharge status: Home / Self Care | Payer: Medicare Other | Source: Ambulatory Visit | Attending: Internal Medicine | Admitting: Internal Medicine

## 2012-06-24 VITALS — BP 150/80 | HR 93 | Ht 62.0 in | Wt 124.4 lb

## 2012-06-24 DIAGNOSIS — J45901 Unspecified asthma with (acute) exacerbation: Secondary | ICD-10-CM

## 2012-06-24 DIAGNOSIS — R0609 Other forms of dyspnea: Secondary | ICD-10-CM

## 2012-06-24 DIAGNOSIS — R0989 Other specified symptoms and signs involving the circulatory and respiratory systems: Secondary | ICD-10-CM

## 2012-06-24 DIAGNOSIS — R0600 Dyspnea, unspecified: Secondary | ICD-10-CM

## 2012-06-24 DIAGNOSIS — J441 Chronic obstructive pulmonary disease with (acute) exacerbation: Secondary | ICD-10-CM

## 2012-06-24 LAB — HEPATIC FUNCTION PANEL
Albumin: 4 g/dL (ref 3.5–5.2)
Alkaline Phosphatase: 74 U/L (ref 39–117)
Total Protein: 8.3 g/dL (ref 6.0–8.3)

## 2012-06-24 LAB — BASIC METABOLIC PANEL
BUN: 12 mg/dL (ref 6–23)
Calcium: 9.6 mg/dL (ref 8.4–10.5)
Creatinine, Ser: 0.9 mg/dL (ref 0.4–1.2)
GFR: 79.34 mL/min (ref >60.00)
Glucose, Bld: 102 mg/dL — ABNORMAL HIGH (ref 70–99)
Sodium: 139 mEq/L (ref 135–145)

## 2012-06-24 LAB — CBC WITH DIFFERENTIAL/PLATELET
Eosinophils Relative: 9 % — ABNORMAL HIGH (ref 0.0–5.0)
HCT: 46.8 % — ABNORMAL HIGH (ref 36.0–46.0)
Hemoglobin: 15.4 g/dL — ABNORMAL HIGH (ref 12.0–15.0)
Lymphs Abs: 1.8 10*3/uL (ref 0.7–4.0)
MCV: 88.6 fl (ref 78.0–100.0)
Monocytes Absolute: 0.5 10*3/uL (ref 0.1–1.0)
Neutro Abs: 5.8 10*3/uL (ref 1.4–7.7)
Platelets: 344 10*3/uL (ref 150.0–400.0)
WBC: 9.1 10*3/uL (ref 4.5–10.5)

## 2012-06-24 MED ORDER — METHYLPREDNISOLONE ACETATE 80 MG/ML IJ SUSP
80.0000 mg | Freq: Once | INTRAMUSCULAR | Status: AC
  Administered 2012-06-24: 80 mg via INTRAMUSCULAR

## 2012-06-24 MED ORDER — ALBUTEROL SULFATE HFA 108 (90 BASE) MCG/ACT IN AERS: 2.0000 | INHALATION_SPRAY | RESPIRATORY_TRACT | Status: DC | PRN

## 2012-06-24 MED ORDER — LEVALBUTEROL HCL 0.63 MG/3ML IN NEBU
0.6300 mg | INHALATION_SOLUTION | Freq: Once | RESPIRATORY_TRACT | Status: AC
  Administered 2012-06-24: 0.63 mg via RESPIRATORY_TRACT

## 2012-06-24 NOTE — Patient Instructions (Addendum)
Neb xop 0.63  Depo 80  Order- CXR   Dx dyspnea  Order- DME new Home O2 2L/m  Dx asthma with chronic bronchitis   Room air sat resting here today    Order- lab- CBC, BMET, Liver enzyme panel  SATURATION QUALIFICATIONS: (This note is used to comply with regulatory documentation for home oxygen)  Patient Saturations on Room Air at Rest = 86%  Patient Saturations on Room Air while Ambulating = 84%  Patient Saturations on 2 Liters of oxygen while Ambulating = 92%  Please briefly explain why patient needs home oxygen:  [name] 06-25-12

## 2012-06-24 NOTE — Progress Notes (Signed)
Subjective:    Patient ID: [name], female    DOB: [birth year], 72 y.o.   MRN: [id number]  HPI 10/04/10-69 yoF never smoker with hx of asthma, left hilar mass/ PET negative.. Last here in 2008 after hospital stay, when she saw [name] noting issues of hypertension and asthma meds. Notes reviewed. Seen now on referral by [name] after recent flare with shortness of breath, productive cough. She tends to feel better as long as she is on an antibiotic or steroids. Within a couple of weeks off, she will start to cough and get tight again. Had bronchitis as a child. In the last 5-6 years wheezing dyspnea has been more perennial. Occasionally wakes with cough. Sputum often yellow. Has had pneumonia x 2 with pneumovax. Little GERD or acid indigestion recognized. Triggers have included strong odors, seasonal pollens, chest colds, but not house dust. Had a sinus infection during the winter. Has been treated for heart rhythm and BP, but denies MI/ angina. She is using Symbicort 160 now and considers it sufficient used twice every day. She did not fill a script for a Proair rescue inhaler. 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  11/10/10- 69 yoF never smoker with hx of asthma, left hilar mass/ PET neg in 2008/ now progressive, RML atelectasis. CT 11/03/10- showed change progression since prior studies of 2008, when abnormalities / PET negative, were attributed to scarring. -Now 2.6 x 2,2 cm left hilar mass, slowly growing. Post obstructive LLL opacity. New RML collapse. Scattered bronchiectasis. No effusion. Denies fever, night sweat, nodes. Cough has been a little less, with brownish to clear mucus, no blood. Has to chew deliberately to avoid choking. Has rarely awakened with hard cough. Symbicort has helped cough. No weight loss. No chest pain, but some shifting back pain. We reviewed her images and the technique and risks of bronchoscopy.   12/12/10- 69 yoF never smoker with hx of asthma, left hilar mass/ PET  neg in 2008/ now progressive, RML atelectasis. PET neg 2012. Husband here  Post bronchoscopy f/u - Bronch 11/30/10- necrotic appearing mass occluding left lower lobe bronchus.  Path- small lymphocytes and eosinophils, neg for malignancy or granuloma. Cultures negative so far.  She still coughs up phlegm, but less than before bronchoscopy.  Aware of a variable nodule in left axilla- comes and goes, otherwise no lumps, nodes or etc. Occasional sweat on morning waking, otherwise no fever or sweat and no pain or blood. She has been aware "something is going on" but not toxic.   11/23/11-  25 yoF never smoker with hx of asthma, left hilar mass/ PET neg in 2008/ now progressive, RML atelectasis. PET neg 2012.  She says she is doing "fair". She has had bronchoscopy/ [name] after me ."Benign inflammatory"  Coughs more with stress. She had gone to emergency room June 8 with anxiety and shortness of breath. We subsequently called in prednisone taper. She denies fever, sweat, adenopathy or chest pain. Often short of breath if anxious. She avoids exertion such as climbing stairs. Prednisone helped cough more productive and she feels better having taken it. Phlegm now is yellow, sometimes darker with no blood. She says that "a bug bite escalates my anxiety". Lab-eosinophils 8%, always high. CXR 10/06/11 IMPRESSION:  No acute infiltrate or pulmonary edema. There is a cavitary lesion  in the left lower lobe retrocardiac region measures at least 2 cm.  Further evaluation with enhanced CT of the chest is recommended.  Original Report Authenticated By: [name]  POP, M.D.    12/25/11-  40 yoF never smoker with hx of asthma, left hilar mass/ PET neg in 2008/ now progressive, RML atelectasis. PET neg 2012.  States breathing has gotten better since last ov; review CT with patient in more detail Off prednisone since July 26. Did not need Symbicort. Cough produces some light yellow sputum but she denies fever, sweat, chest  pain. She wakes coughing occasionally. CT chest 12/25/11- images reviewed with her: IMPRESSION:  Interval decrease in size of the left infrahilar lesion which has  become air-filled in the interval. It is possible that this could  represent some central cystic bronchiectasis or decompression of  the previous fluid density nodule.  Interval re-expansion of the right middle lobe.  No new or progressive findings.  Original Report Authenticated By: [name], M.D.   06/24/12- 3 yoF never smoker with hx of asthma, left hilar mass/ PET neg in 2008/ then progressive, RML atelectasis. PET neg 2012.  FOLLOWS FOR: was doing pretty good until today-started having increased SOB ; unsure if wheezing. Family here With persistent questioning we determined that for the past month she had been noticing orthopnea, propping up to sleep. No palpitation ankle edema. Over the last 3 or 4 days she began noting dry throat and some mild low back ache. She had had diarrhea for a few days after church parking in mid February but that had resolved except for some residual urgency. No fever, chills. Sputum usually white, rarely brown. No chest pain, blood or swollen glands  ROS-see HPI Constitutional:   No-   weight loss, night sweats, fevers, chills, fatigue, lassitude. HEENT:   No-  headaches, difficulty swallowing, tooth/dental problems, sore throat,       No-  sneezing, itching, ear ache, nasal congestion, post nasal drip,  CV:  No-   chest pain, +orthopnea, no-PND, swelling in lower extremities, anasarca,  dizziness, palpitations Resp: +   shortness of breath with exertion or at rest.              +  productive cough,  + non-productive cough,  No- coughing up of blood.              +   change in color of mucus.  No- wheezing.   Skin: No-   rash or lesions. GI:  No-   heartburn, indigestion, abdominal pain, nausea, vomiting,  GU: . MS:  No-   joint pain or swelling.   Neuro-     nothing unusual Psych:  No-  change in mood or affect. No depression or anxiety.  No memory loss.  OBJ- Physical Exam BP 150/80  Pulse 93  Ht 5\' 2"  (1.575 m)  Wt 124 lb 6.4 oz (56.427 kg)  BMI 22.75 kg/m2  SpO2 92% on O2 2 L General- Alert, Oriented, Affect-appropriate, Distress- none acute Skin- rash-none, lesions- none, excoriation- none  +hands cool Lymphadenopathy- none Head- atraumatic            Eyes- Gross vision intact, PERRLA, conjunctivae and secretions clear            Ears- +somewhat hard of hearing            Nose- Clear, no-Septal dev, mucus, polyps, erosion, perforation             Throat- Mallampati II , mucosa clear , drainage- none, tonsils- atrophic Neck- flexible , trachea midline, no stridor , thyroid nl, carotid no bruit Chest - symmetrical excursion , unlabored  Heart/CV- RRR , no murmur , no gallop  , no rub, nl s1 s2                           - JVD- none , edema- none, stasis changes- none, varices- none           Lung- + wheeze L>R, cough- none , dullness-none, rub- none           Chest wall-  Abd- Br/ Gen/ Rectal- Not done, not indicated Extrem- cyanosis- none, clubbing, none, atrophy- none, strength- nl Neuro- grossly intact to observation

## 2012-06-25 NOTE — Assessment & Plan Note (Signed)
She has had a poorly defined process progressive density in the midlung. This was worked up extensively including 2 bronchoscopies with no definite diagnosis. 6 months ago for the first time there was evidence of partial clearing. Now clinically she had been doing very well and stable until this acute process. In the absence of heart failure and, considered low probability for blood clot, we will treat dyspnea as an airway inflammatory process, perhaps viral. Plan-nebulizer treatment and Depo-Medrol, chest x-ray, CBC, chemistry. Home oxygen 2 L

## 2012-06-26 ENCOUNTER — Ambulatory Visit (INDEPENDENT_AMBULATORY_CARE_PROVIDER_SITE_OTHER)
Admission: RE | Admit: 2012-06-26 | Discharge: 2012-06-26 | Disposition: A | Discharge status: Home / Self Care | Payer: Medicare Other | Source: Ambulatory Visit | Attending: Internal Medicine | Admitting: Internal Medicine

## 2012-06-26 ENCOUNTER — Other Ambulatory Visit: Payer: Self-pay | Admitting: Internal Medicine

## 2012-06-26 DIAGNOSIS — R0609 Other forms of dyspnea: Secondary | ICD-10-CM

## 2012-06-26 DIAGNOSIS — R0600 Dyspnea, unspecified: Secondary | ICD-10-CM

## 2012-06-26 MED ORDER — IOHEXOL 350 MG/ML SOLN
80.0000 mL | Freq: Once | INTRAVENOUS | Status: AC | PRN
  Administered 2012-06-26: 80 mL via INTRAVENOUS

## 2012-06-26 NOTE — Progress Notes (Signed)
Quick Note:  Pt aware of results. ______ 

## 2012-06-26 NOTE — Progress Notes (Signed)
Quick Note:  Pt aware of results. Pt states she feels she is about 10% better since shot,breathing tx, and pred RX. She states is feels better overall when on Prednisone but still has flare ups of breathing concerns/SOB with activity. ______

## 2012-07-08 ENCOUNTER — Ambulatory Visit (INDEPENDENT_AMBULATORY_CARE_PROVIDER_SITE_OTHER): Payer: Medicare Other | Admitting: Internal Medicine

## 2012-07-08 ENCOUNTER — Encounter: Payer: Self-pay | Admitting: Internal Medicine

## 2012-07-08 VITALS — BP 128/70 | HR 86 | Ht 62.0 in | Wt 125.4 lb

## 2012-07-08 DIAGNOSIS — J479 Bronchiectasis, uncomplicated: Secondary | ICD-10-CM

## 2012-07-08 DIAGNOSIS — J690 Pneumonitis due to inhalation of food and vomit: Secondary | ICD-10-CM

## 2012-07-08 DIAGNOSIS — J309 Allergic rhinitis, unspecified: Secondary | ICD-10-CM

## 2012-07-08 NOTE — Progress Notes (Signed)
Subjective:    Patient ID: [name], female    DOB: [birth year], 72 y.o.   MRN: [id number]  HPI 10/04/10-69 yoF never smoker with hx of asthma, left hilar mass/ PET negative.. Last here in 2008 after hospital stay, when she saw [name] noting issues of hypertension and asthma meds. Notes reviewed. Seen now on referral by [name] after recent flare with shortness of breath, productive cough. She tends to feel better as long as she is on an antibiotic or steroids. Within a couple of weeks off, she will start to cough and get tight again. Had bronchitis as a child. In the last 5-6 years wheezing dyspnea has been more perennial. Occasionally wakes with cough. Sputum often yellow. Has had pneumonia x 2 with pneumovax. Little GERD or acid indigestion recognized. Triggers have included strong odors, seasonal pollens, chest colds, but not house dust. Had a sinus infection during the winter. Has been treated for heart rhythm and BP, but denies MI/ angina. She is using Symbicort 160 now and considers it sufficient used twice every day. She did not fill a script for a Proair rescue inhaler. 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  11/10/10- 69 yoF never smoker with hx of asthma, left hilar mass/ PET neg in 2008/ now progressive, RML atelectasis. CT 11/03/10- showed change progression since prior studies of 2008, when abnormalities / PET negative, were attributed to scarring. -Now 2.6 x 2,2 cm left hilar mass, slowly growing. Post obstructive LLL opacity. New RML collapse. Scattered bronchiectasis. No effusion. Denies fever, night sweat, nodes. Cough has been a little less, with brownish to clear mucus, no blood. Has to chew deliberately to avoid choking. Has rarely awakened with hard cough. Symbicort has helped cough. No weight loss. No chest pain, but some shifting back pain. We reviewed her images and the technique and risks of bronchoscopy.   12/12/10- 69 yoF never smoker with hx of asthma, left hilar mass/ PET  neg in 2008/ now progressive, RML atelectasis. PET neg 2012. Husband here  Post bronchoscopy f/u - Bronch 11/30/10- necrotic appearing mass occluding left lower lobe bronchus.  Path- small lymphocytes and eosinophils, neg for malignancy or granuloma. Cultures negative so far.  She still coughs up phlegm, but less than before bronchoscopy.  Aware of a variable nodule in left axilla- comes and goes, otherwise no lumps, nodes or etc. Occasional sweat on morning waking, otherwise no fever or sweat and no pain or blood. She has been aware "something is going on" but not toxic.   11/23/11-  25 yoF never smoker with hx of asthma, left hilar mass/ PET neg in 2008/ now progressive, RML atelectasis. PET neg 2012.  She says she is doing "fair". She has had bronchoscopy/ [name] after me ."Benign inflammatory"  Coughs more with stress. She had gone to emergency room June 8 with anxiety and shortness of breath. We subsequently called in prednisone taper. She denies fever, sweat, adenopathy or chest pain. Often short of breath if anxious. She avoids exertion such as climbing stairs. Prednisone helped cough more productive and she feels better having taken it. Phlegm now is yellow, sometimes darker with no blood. She says that "a bug bite escalates my anxiety". Lab-eosinophils 8%, always high. CXR 10/06/11 IMPRESSION:  No acute infiltrate or pulmonary edema. There is a cavitary lesion  in the left lower lobe retrocardiac region measures at least 2 cm.  Further evaluation with enhanced CT of the chest is recommended.  Original Report Authenticated By: [name]  POP, M.D.    12/25/11-  55 yoF never smoker with hx of asthma, left hilar mass/ PET neg in 2008/ now progressive, RML atelectasis. PET neg 2012.  States breathing has gotten better since last ov; review CT with patient in more detail Off prednisone since July 26. Did not need Symbicort. Cough produces some light yellow sputum but she denies fever, sweat, chest  pain. She wakes coughing occasionally. CT chest 12/25/11- images reviewed with her: IMPRESSION:  Interval decrease in size of the left infrahilar lesion which has  become air-filled in the interval. It is possible that this could  represent some central cystic bronchiectasis or decompression of  the previous fluid density nodule.  Interval re-expansion of the right middle lobe.  No new or progressive findings.  Original Report Authenticated By: [name], M.D.   06/24/12- 63 yoF never smoker with hx of asthma, left hilar mass/ PET neg in 2008/ then progressive, RML atelectasis. PET neg 2012.  FOLLOWS FOR: was doing pretty good until today-started having increased SOB ; unsure if wheezing. Family here With persistent questioning we determined that for the past month she had been noticing orthopnea, propping up to sleep. No palpitation ankle edema. Over the last 3 or 4 days she began noting dry throat and some mild low back ache. She had had diarrhea for a few days after church parking in mid February but that had resolved except for some residual urgency. No fever, chills. Sputum usually white, rarely brown. No chest pain, blood or swollen glands  07/08/12 71 yoF never smoker with hx of asthma, left hilar mass/ PET neg in 2008/ then progressive, RML atelectasis. PET neg 2012.    Son here FOLLOWS FOR: states breathing is slightly better since 2 weeks ago; ROOM AIR O2 of 90% upon arrival She feels she is doing better she comes in looking well dressed, stronger and comfortable occasional productive cough, scant clear sputum or little yellow-brown. No blood. Denies chest pain, fever or sweat. She is carrying her oxygen. Labs 06/24/12- CBC normaal except eosinophilia 9%. LFTs normal. Chemistry normal except CO2 34 suggesting compensation for respiratory acidosis. CT chest 06/26/12- images were reviewed with her and her son. IMPRESSION:  1. No evidence for acute pulmonary embolus.  2. Ground-glass  attenuation and peripheral nodular consolidation  within the right lower lobe consistent with pneumonitis. Findings  likely sequela of aspiration and/or pneumonia.  3. Cavitary lesion versus cystic bronchiectasis within the  perihilar left lower lobe. That this is unchanged in size from  previous exam but now contains a central soft tissue attenuating  filling defect which may represent a mycetoma.  4. Diffuse, bilateral bronchial wall thickening which is favored  to represent the sequela of chronic aspiration and bronchitis.  Original Report Authenticated By: Signa Kell, M.D.   ROS-see HPI Constitutional:   No-   weight loss, night sweats, fevers, chills, fatigue, lassitude. HEENT:   No-  headaches, difficulty swallowing, tooth/dental problems, sore throat,       No-  sneezing, itching, ear ache, nasal congestion, post nasal drip,  CV:  No-   chest pain, +orthopnea, no-PND, swelling in lower extremities, anasarca,  dizziness, palpitations Resp: +   shortness of breath with exertion or at rest.              +  productive cough,  + non-productive cough,  No- coughing up of blood.              +  change in color of mucus.  No- wheezing.   Skin: No-   rash or lesions. GI:  No-   heartburn, indigestion, abdominal pain, nausea, vomiting,  GU: . MS:  No-   joint pain or swelling.   Neuro-     nothing unusual Psych:  No- change in mood or affect. No depression or anxiety.  No memory loss.  OBJ- Physical Exam BP 128/70  Pulse 86  Ht 5\' 2"  (1.575 m)  Wt 125 lb 6.4 oz (56.881 kg)  BMI 22.93 kg/m2  SpO2 90% General- Alert, Oriented, Affect-appropriate, Distress- none acute Skin- rash-none, lesions- none, excoriation- none   Lymphadenopathy- none Head- atraumatic            Eyes- Gross vision intact, PERRLA, conjunctivae and secretions clear            Ears- +somewhat hard of hearing            Nose- Clear, no-Septal dev, mucus, polyps, erosion, perforation             Throat-  Mallampati II , mucosa clear , drainage- none, tonsils- atrophic Neck- flexible , trachea midline, no stridor , thyroid nl, carotid no bruit Chest - symmetrical excursion , unlabored           Heart/CV- RRR , no murmur , no gallop  , no rub, nl s1 s2                           - JVD- none , edema- none, stasis changes- none, varices- none           Lung- + diminished but unlabored with no rales, rhonchi or wheeze, cough- none , dullness-none, rub- none           Chest wall-  Abd- Br/ Gen/ Rectal- Not done, not indicated Extrem- cyanosis- none, clubbing, none, atrophy- none, strength- nl Neuro- grossly intact to observation

## 2012-07-08 NOTE — Patient Instructions (Addendum)
Order- Schedule barium swallow esophagram    Dx aspiration pneumonia  For now continue to use your oxygen for sleep and exertion. You can sit quietly, for instance TV, without your oxygen.

## 2012-07-11 ENCOUNTER — Encounter: Payer: Self-pay | Admitting: Internal Medicine

## 2012-07-11 NOTE — Assessment & Plan Note (Signed)
Plan-saline gel

## 2012-07-11 NOTE — Assessment & Plan Note (Addendum)
She has never recognized reflux or aspiration, but that could explain the amorphous debris and cystic bronchiectasis changes.. Plan-barium swallow. Continue oxygen during exertion and with sleep.

## 2012-07-15 ENCOUNTER — Ambulatory Visit (HOSPITAL_COMMUNITY)
Admission: RE | Admit: 2012-07-15 | Discharge: 2012-07-15 | Disposition: A | Discharge status: Home / Self Care | Payer: Medicare Other | Source: Ambulatory Visit | Attending: Pulmonary Disease | Admitting: Pulmonary Disease

## 2012-07-15 ENCOUNTER — Telehealth: Payer: Self-pay | Admitting: Internal Medicine

## 2012-07-15 DIAGNOSIS — J690 Pneumonitis due to inhalation of food and vomit: Secondary | ICD-10-CM | POA: Insufficient documentation

## 2012-07-15 DIAGNOSIS — R059 Cough, unspecified: Secondary | ICD-10-CM | POA: Insufficient documentation

## 2012-07-15 DIAGNOSIS — R05 Cough: Secondary | ICD-10-CM | POA: Insufficient documentation

## 2012-07-15 NOTE — Telephone Encounter (Signed)
The barium swallow is completely normal. I had suspected reflux and aspiration into her lungs, but I can't show that is happening.

## 2012-07-15 NOTE — Telephone Encounter (Signed)
I spoke with pt and advised her will forward the message over to Dr. Maple Hudson letting him know about barium swallow results. She would like these as soon as he reviews them. These were printed off with phone note as well. Please advise Dr. Maple Hudson thanks

## 2012-07-15 NOTE — Telephone Encounter (Signed)
ATC home # NA wcb LMTCB X1 ON CELL #

## 2012-07-17 NOTE — Telephone Encounter (Signed)
Pt aware of results being normal and to call as needed prior to OV planned with CY.

## 2012-07-24 ENCOUNTER — Other Ambulatory Visit: Payer: Self-pay | Admitting: Internal Medicine

## 2012-08-12 ENCOUNTER — Telehealth: Payer: Self-pay | Admitting: Internal Medicine

## 2012-08-12 ENCOUNTER — Ambulatory Visit (INDEPENDENT_AMBULATORY_CARE_PROVIDER_SITE_OTHER)
Admission: RE | Admit: 2012-08-12 | Discharge: 2012-08-12 | Disposition: A | Discharge status: Home / Self Care | Payer: Medicare Other | Source: Ambulatory Visit | Attending: Internal Medicine | Admitting: Internal Medicine

## 2012-08-12 ENCOUNTER — Encounter: Payer: Self-pay | Admitting: Internal Medicine

## 2012-08-12 ENCOUNTER — Ambulatory Visit (INDEPENDENT_AMBULATORY_CARE_PROVIDER_SITE_OTHER): Payer: Medicare Other | Admitting: Internal Medicine

## 2012-08-12 VITALS — BP 138/78 | HR 94 | Ht 62.0 in | Wt 124.2 lb

## 2012-08-12 DIAGNOSIS — R918 Other nonspecific abnormal finding of lung field: Secondary | ICD-10-CM

## 2012-08-12 DIAGNOSIS — R222 Localized swelling, mass and lump, trunk: Secondary | ICD-10-CM

## 2012-08-12 DIAGNOSIS — J42 Unspecified chronic bronchitis: Secondary | ICD-10-CM

## 2012-08-12 DIAGNOSIS — J479 Bronchiectasis, uncomplicated: Secondary | ICD-10-CM

## 2012-08-12 NOTE — Telephone Encounter (Signed)
Spoke with patient-aware that we are closed on Monday 09-22-12 and booked Tuesday 09-23-12. I offered Friday 09-19-12 at 4:00pm and patient accepted. Nothing more needed at this time.

## 2012-08-12 NOTE — Progress Notes (Signed)
Subjective:    Patient ID: [name], female    DOB: [birth year], 72 y.o.   MRN: [id number]  HPI 10/04/10-69 yoF never smoker with hx of asthma, left hilar mass/ PET negative.. Last here in 2008 after hospital stay, when she saw [name] noting issues of hypertension and asthma meds. Notes reviewed. Seen now on referral by [name] after recent flare with shortness of breath, productive cough. She tends to feel better as long as she is on an antibiotic or steroids. Within a couple of weeks off, she will start to cough and get tight again. Had bronchitis as a child. In the last 5-6 years wheezing dyspnea has been more perennial. Occasionally wakes with cough. Sputum often yellow. Has had pneumonia x 2 with pneumovax. Little GERD or acid indigestion recognized. Triggers have included strong odors, seasonal pollens, chest colds, but not house dust. Had a sinus infection during the winter. Has been treated for heart rhythm and BP, but denies MI/ angina. She is using Symbicort 160 now and considers it sufficient used twice every day. She did not fill a script for a Proair rescue inhaler. 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  11/10/10- 69 yoF never smoker with hx of asthma, left hilar mass/ PET neg in 2008/ now progressive, RML atelectasis. CT 11/03/10- showed change progression since prior studies of 2008, when abnormalities / PET negative, were attributed to scarring. -Now 2.6 x 2,2 cm left hilar mass, slowly growing. Post obstructive LLL opacity. New RML collapse. Scattered bronchiectasis. No effusion. Denies fever, night sweat, nodes. Cough has been a little less, with brownish to clear mucus, no blood. Has to chew deliberately to avoid choking. Has rarely awakened with hard cough. Symbicort has helped cough. No weight loss. No chest pain, but some shifting back pain. We reviewed her images and the technique and risks of bronchoscopy.   12/12/10- 69 yoF never smoker with hx of asthma, left hilar mass/ PET  neg in 2008/ now progressive, RML atelectasis. PET neg 2012. Husband here  Post bronchoscopy f/u - Bronch 11/30/10- necrotic appearing mass occluding left lower lobe bronchus.  Path- small lymphocytes and eosinophils, neg for malignancy or granuloma. Cultures negative so far.  She still coughs up phlegm, but less than before bronchoscopy.  Aware of a variable nodule in left axilla- comes and goes, otherwise no lumps, nodes or etc. Occasional sweat on morning waking, otherwise no fever or sweat and no pain or blood. She has been aware "something is going on" but not toxic.   11/23/11-  25 yoF never smoker with hx of asthma, left hilar mass/ PET neg in 2008/ now progressive, RML atelectasis. PET neg 2012.  She says she is doing "fair". She has had bronchoscopy/ [name] after me ."Benign inflammatory"  Coughs more with stress. She had gone to emergency room June 8 with anxiety and shortness of breath. We subsequently called in prednisone taper. She denies fever, sweat, adenopathy or chest pain. Often short of breath if anxious. She avoids exertion such as climbing stairs. Prednisone helped cough more productive and she feels better having taken it. Phlegm now is yellow, sometimes darker with no blood. She says that "a bug bite escalates my anxiety". Lab-eosinophils 8%, always high. CXR 10/06/11 IMPRESSION:  No acute infiltrate or pulmonary edema. There is a cavitary lesion  in the left lower lobe retrocardiac region measures at least 2 cm.  Further evaluation with enhanced CT of the chest is recommended.  Original Report Authenticated By: [name]  POP, M.D.    12/25/11-  66 yoF never smoker with hx of asthma, left hilar mass/ PET neg in 2008/ now progressive, RML atelectasis. PET neg 2012.  States breathing has gotten better since last ov; review CT with patient in more detail Off prednisone since July 26. Did not need Symbicort. Cough produces some light yellow sputum but she denies fever, sweat, chest  pain. She wakes coughing occasionally. CT chest 12/25/11- images reviewed with her: IMPRESSION:  Interval decrease in size of the left infrahilar lesion which has  become air-filled in the interval. It is possible that this could  represent some central cystic bronchiectasis or decompression of  the previous fluid density nodule.  Interval re-expansion of the right middle lobe.  No new or progressive findings.  Original Report Authenticated By: [name], M.D.   06/24/12- 6 yoF never smoker with hx of asthma, left hilar mass/ PET neg in 2008/ then progressive, RML atelectasis. PET neg 2012.  FOLLOWS FOR: was doing pretty good until today-started having increased SOB ; unsure if wheezing. Family here With persistent questioning we determined that for the past month she had been noticing orthopnea, propping up to sleep. No palpitation ankle edema. Over the last 3 or 4 days she began noting dry throat and some mild low back ache. She had had diarrhea for a few days after church parking in mid February but that had resolved except for some residual urgency. No fever, chills. Sputum usually white, rarely brown. No chest pain, blood or swollen glands  07/08/12 71 yoF never smoker with hx of asthma, left hilar mass/ PET neg in 2008/ then progressive, RML atelectasis. PET neg 2012.    Son here FOLLOWS FOR: states breathing is slightly better since 2 weeks ago; ROOM AIR O2 of 90% upon arrival She feels she is doing better she comes in looking well dressed, stronger and comfortable occasional productive cough, scant clear sputum or little yellow-brown. No blood. Denies chest pain, fever or sweat. She is carrying her oxygen. Labs 06/24/12- CBC normaal except eosinophilia 9%. LFTs normal. Chemistry normal except CO2 34 suggesting compensation for respiratory acidosis. CT chest 06/26/12- images were reviewed with her and her son. IMPRESSION:  1. No evidence for acute pulmonary embolus.  2. Ground-glass  attenuation and peripheral nodular consolidation  within the right lower lobe consistent with pneumonitis. Findings  likely sequela of aspiration and/or pneumonia.  3. Cavitary lesion versus cystic bronchiectasis within the  perihilar left lower lobe. That this is unchanged in size from  previous exam but now contains a central soft tissue attenuating  filling defect which may represent a mycetoma.  4. Diffuse, bilateral bronchial wall thickening which is favored  to represent the sequela of chronic aspiration and bronchitis.  Original Report Authenticated By: Signa Kell, M.D.  08/12/12- 82 yoF never smoker with hx of asthma, left hilar mass/ PET neg in 2008/ then progressive, RML atelectasis. PET neg 2012.    Son here FOLLOWS FOR: review Barium swallow results in detail with patient; having SOB and wheezing-worse with activity She reports feeling much better compared with last winter. Still some cough and shortness of breath white or slightly brown sputum. Dyspnea with vigorous exertion/stairs. Denies sweats or fever, chest pain or palpitation. Steroids make her feel better. Sleeps with O2 2L/ [facility]. Barium swallow 07/15/12- negative for evidence of reflux or aspiration.  ROS-see HPI Constitutional:   No-   weight loss, night sweats, fevers, chills, fatigue, lassitude. HEENT:   No-  headaches, difficulty swallowing, tooth/dental problems, sore throat,       No-  sneezing, itching, ear ache, nasal congestion, post nasal drip,  CV:  No-   chest pain, +orthopnea, no-PND, swelling in lower extremities, anasarca,  dizziness, palpitations Resp: +   shortness of breath with exertion or at rest.              +  productive cough,  + non-productive cough,  No- coughing up of blood.              +   change in color of mucus.  No- wheezing.   Skin: No-   rash or lesions. GI:  No-   heartburn, indigestion, abdominal pain, nausea, vomiting,  GU: . MS:  No-   joint pain or swelling.   Neuro-      nothing unusual Psych:  No- change in mood or affect. No depression or anxiety.  No memory loss.  OBJ- Physical Exam General- Alert, Oriented, Affect-appropriate, Distress- none acute. Thin Skin- rash-none, lesions- none, excoriation- none   Lymphadenopathy- none Head- atraumatic            Eyes- Gross vision intact, PERRLA, conjunctivae and secretions clear            Ears- +somewhat hard of hearing            Nose- Clear, no-Septal dev, mucus, polyps, erosion, perforation             Throat- Mallampati II , mucosa clear , drainage- none, tonsils- atrophic Neck- flexible , trachea midline, no stridor , thyroid nl, carotid no bruit Chest - symmetrical excursion , unlabored           Heart/CV- RRR , no murmur , no gallop  , no rub, nl s1 s2                           - JVD- none , edema- none, stasis changes- none, varices- none           Lung- + few rhonchi left base, unlabored, cough- none , dullness-none, rub- none           Chest wall-  Abd- Br/ Gen/ Rectal- Not done, not indicated Extrem- cyanosis- none, clubbing, none, atrophy- none, strength- nl Neuro- grossly intact to observation

## 2012-08-12 NOTE — Telephone Encounter (Signed)
Called, spoke with pt.  She was seen by Dr. Maple Hudson today and was asked to come back in 1 month. She is specifically requesting OV on Monday, May 26 or Tuesday, May 27.  We are closed on the 26 d/t holiday.  Pt states she will be leaving on the 27th to go to [place].  States CDY advised she could get a neb tx during next OV.  Pt is ok with coming in any the week before or on May 27 but would like to come in closer to or on May 27 though d/t getting neb tx.  She would like this to hold her over while she is in [place].  Katie, pls advise if pt can be worked in and when.  Thank you.

## 2012-08-12 NOTE — Patient Instructions (Addendum)
Order- CXR   Dx lung mass, chronic bronchitis  Order- DME [facility]- Portable O2 concentrator for travel 2L/min for sleep   Ok to take plain Mucinex 600-1200mg  twice daily

## 2012-08-18 ENCOUNTER — Emergency Department (HOSPITAL_COMMUNITY): Payer: Medicare Other

## 2012-08-18 ENCOUNTER — Encounter (HOSPITAL_COMMUNITY): Payer: Self-pay | Admitting: Radiology

## 2012-08-18 ENCOUNTER — Emergency Department (HOSPITAL_COMMUNITY)
Admission: EM | Admit: 2012-08-18 | Discharge: 2012-08-18 | Disposition: A | Discharge status: Home / Self Care | Payer: Medicare Other | Attending: Emergency Medicine | Admitting: Emergency Medicine

## 2012-08-18 ENCOUNTER — Telehealth: Payer: Self-pay | Admitting: Internal Medicine

## 2012-08-18 DIAGNOSIS — Z880 Allergy status to penicillin: Secondary | ICD-10-CM | POA: Insufficient documentation

## 2012-08-18 DIAGNOSIS — R5381 Other malaise: Secondary | ICD-10-CM | POA: Insufficient documentation

## 2012-08-18 DIAGNOSIS — R112 Nausea with vomiting, unspecified: Secondary | ICD-10-CM | POA: Insufficient documentation

## 2012-08-18 DIAGNOSIS — Z79899 Other long term (current) drug therapy: Secondary | ICD-10-CM | POA: Insufficient documentation

## 2012-08-18 DIAGNOSIS — R109 Unspecified abdominal pain: Secondary | ICD-10-CM | POA: Insufficient documentation

## 2012-08-18 DIAGNOSIS — R1110 Vomiting, unspecified: Secondary | ICD-10-CM

## 2012-08-18 DIAGNOSIS — I10 Essential (primary) hypertension: Secondary | ICD-10-CM | POA: Insufficient documentation

## 2012-08-18 DIAGNOSIS — R51 Headache: Secondary | ICD-10-CM | POA: Insufficient documentation

## 2012-08-18 DIAGNOSIS — IMO0001 Reserved for inherently not codable concepts without codable children: Secondary | ICD-10-CM | POA: Insufficient documentation

## 2012-08-18 DIAGNOSIS — R197 Diarrhea, unspecified: Secondary | ICD-10-CM | POA: Insufficient documentation

## 2012-08-18 DIAGNOSIS — Z8659 Personal history of other mental and behavioral disorders: Secondary | ICD-10-CM | POA: Insufficient documentation

## 2012-08-18 DIAGNOSIS — R000 Tachycardia, unspecified: Secondary | ICD-10-CM | POA: Insufficient documentation

## 2012-08-18 DIAGNOSIS — J45909 Unspecified asthma, uncomplicated: Secondary | ICD-10-CM | POA: Insufficient documentation

## 2012-08-18 LAB — URINALYSIS, ROUTINE W REFLEX MICROSCOPIC
Bilirubin Urine: NEGATIVE
Glucose, UA: NEGATIVE mg/dL
Hgb urine dipstick: NEGATIVE
Ketones, ur: NEGATIVE mg/dL
Protein, ur: NEGATIVE mg/dL

## 2012-08-18 LAB — COMPREHENSIVE METABOLIC PANEL
AST: 21 U/L (ref 0–37)
Alkaline Phosphatase: 76 U/L (ref 39–117)
CO2: 28 mEq/L (ref 19–32)
Chloride: 102 mEq/L (ref 96–112)
Creatinine, Ser: 0.71 mg/dL (ref 0.50–1.10)
GFR calc non Af Amer: 85 mL/min — ABNORMAL LOW (ref >90)
Total Bilirubin: 0.6 mg/dL (ref 0.3–1.2)

## 2012-08-18 LAB — CBC WITH DIFFERENTIAL/PLATELET
Basophils Absolute: 0 10*3/uL (ref 0.0–0.1)
HCT: 48.1 % — ABNORMAL HIGH (ref 36.0–46.0)
Hemoglobin: 16.7 g/dL — ABNORMAL HIGH (ref 12.0–15.0)
Lymphocytes Relative: 9 % — ABNORMAL LOW (ref 12–46)
Monocytes Absolute: 0.4 10*3/uL (ref 0.1–1.0)
Monocytes Relative: 5 % (ref 3–12)
Neutro Abs: 6.2 10*3/uL (ref 1.7–7.7)
WBC: 7.4 10*3/uL (ref 4.0–10.5)

## 2012-08-18 LAB — URINE MICROSCOPIC-ADD ON

## 2012-08-18 MED ORDER — METOCLOPRAMIDE HCL 5 MG/ML IJ SOLN
10.0000 mg | Freq: Once | INTRAMUSCULAR | Status: AC
  Administered 2012-08-18: 10 mg via INTRAVENOUS
  Filled 2012-08-18: qty 2

## 2012-08-18 MED ORDER — TRAMADOL HCL 50 MG PO TABS: 50.0000 mg | ORAL_TABLET | Freq: Four times a day (QID) | ORAL | Status: DC | PRN

## 2012-08-18 MED ORDER — IOHEXOL 300 MG/ML  SOLN
25.0000 mL | INTRAMUSCULAR | Status: AC
  Administered 2012-08-18 (×2): 25 mL via ORAL

## 2012-08-18 MED ORDER — HYDROMORPHONE HCL PF 1 MG/ML IJ SOLN
0.5000 mg | Freq: Once | INTRAMUSCULAR | Status: AC
  Administered 2012-08-18: 0.5 mg via INTRAVENOUS
  Filled 2012-08-18: qty 1

## 2012-08-18 MED ORDER — IOHEXOL 300 MG/ML  SOLN
80.0000 mL | Freq: Once | INTRAMUSCULAR | Status: AC | PRN
  Administered 2012-08-18: 80 mL via INTRAVENOUS

## 2012-08-18 MED ORDER — ONDANSETRON HCL 4 MG/2ML IJ SOLN
4.0000 mg | Freq: Once | INTRAMUSCULAR | Status: AC
  Administered 2012-08-18: 4 mg via INTRAVENOUS

## 2012-08-18 MED ORDER — ONDANSETRON HCL 4 MG/2ML IJ SOLN: 4.0000 mg | Freq: Once | INTRAMUSCULAR | Status: DC

## 2012-08-18 MED ORDER — METOCLOPRAMIDE HCL 10 MG PO TABS: 10.0000 mg | ORAL_TABLET | Freq: Four times a day (QID) | ORAL | Status: DC

## 2012-08-18 MED ORDER — ONDANSETRON HCL 4 MG/2ML IJ SOLN
4.0000 mg | Freq: Once | INTRAMUSCULAR | Status: AC
  Administered 2012-08-18: 4 mg via INTRAVENOUS
  Filled 2012-08-18: qty 2

## 2012-08-18 MED ORDER — ONDANSETRON HCL 4 MG/2ML IJ SOLN
4.0000 mg | Freq: Once | INTRAMUSCULAR | Status: DC
  Filled 2012-08-18 (×2): qty 2

## 2012-08-18 MED ORDER — SODIUM CHLORIDE 0.9 % IV SOLN
INTRAVENOUS | Status: DC
  Administered 2012-08-18: 17:00:00 via INTRAVENOUS

## 2012-08-18 MED ORDER — SODIUM CHLORIDE 0.9 % IV BOLUS (SEPSIS)
250.0000 mL | Freq: Once | INTRAVENOUS | Status: AC
  Administered 2012-08-18: 250 mL via INTRAVENOUS

## 2012-08-18 NOTE — ED Notes (Signed)
Per report from [facility] pt called 911 with a cc of ABD pain (lower quadrants) on EMS arrival pt was noted to be hypotensive in the 90's systolic and have a HR of 160 SVT.  No other distress noted.  Pt was also noted to be orthostatic.  Pt received 500 ml of NS PTA IV and Adenosine 6mg  IV with pt converting to Sinus tach.  No distress at the present.  Resp symmetrical and unlabored.

## 2012-08-18 NOTE — ED Notes (Signed)
Pt resting in bed drinking contrast.  Pt made aware to contact staff when pt has finished first cup of contrast and part of second.  Pt vomited majority of first cup prior to discussion.

## 2012-08-18 NOTE — ED Provider Notes (Signed)
History     CSN: [id number]  Arrival date & time 08/18/12  1548   First MD Initiated Contact with Patient 08/18/12 1555      Chief Complaint  Patient presents with  . Tachycardia    (Consider location/radiation/quality/duration/timing/severity/associated sxs/prior treatment) The history is provided by the patient, the EMS personnel and the spouse.  72 year old female with acute onset of LQ abdomial pain, Nausea, Vomiitng, and Diarrhea at 0930, felt fine yesterday. Abdominal pain is 10/10 sharp and ache, intermittent and associated with vomiting and diarrhea x 3. No blood. No SOB or Chest pain. Patient not aware of EMS finding of tachycardia SVT requiring fluids and adenosine converting to sinus Tach.   Past Medical History  Diagnosis Date  . Anxiety   . Asthma   . HTN (hypertension)   . Allergic rhinitis     Past Surgical History  Procedure Laterality Date  . Tubal ligation      Family History  Problem Relation Age of Onset  . Diabetes Father   . Asthma Father   . Allergies Father   . Mental illness Mother     alzheimer's  . Allergies Brother   . Allergies Brother   . Heart disease Maternal Aunt     History  Substance Use Topics  . Smoking status: Never Smoker   . Smokeless tobacco: Never Used     Comment: father smoked, worked w/smokers  . Alcohol Use: No    OB History   Grav Para Term Preterm Abortions TAB SAB Ect Mult Living                  Review of Systems  Constitutional: Positive for fatigue. Negative for fever.  HENT: Negative for congestion and neck pain.   Eyes: Negative for visual disturbance.  Respiratory: Negative for shortness of breath.   Cardiovascular: Negative for chest pain.  Gastrointestinal: Positive for nausea, vomiting, abdominal pain and diarrhea.  Genitourinary: Negative for dysuria.  Musculoskeletal: Positive for myalgias.  Skin: Negative for rash.  Neurological: Positive for headaches. Negative for syncope.   Hematological: Does not bruise/bleed easily.  Psychiatric/Behavioral: Negative for confusion.    Allergies  Penicillins; Shellfish allergy; Sulfonamide derivatives; Aspirin; Clindamycin; Fluticasone-salmeterol; Fruit & vegetable daily; and Montelukast sodium  Home Medications   Current Outpatient Rx  Name  Route  Sig  Dispense  Refill  . acetaminophen (TYLENOL) 325 MG tablet   Oral   Take 325 mg by mouth every 6 (six) hours as needed. For pain         . albuterol (PROAIR HFA) 108 (90 BASE) MCG/ACT inhaler   Inhalation   Inhale 2 puffs into the lungs every 4 (four) hours as needed for wheezing or shortness of breath. For shortness of breath   1 Inhaler   prn   . Ascorbic Acid (VITAMIN C PO)   Oral   Take 1 tablet by mouth daily.          . Azelastine-Fluticasone 137-50 MCG/ACT SUSP   Nasal   Place 1 Act into the nose daily as needed (allergy congestion).          . B Complex Vitamins (VITAMIN B COMPLEX PO)   Oral   Take 1 tablet by mouth daily as needed (takes when she remembers to take it).          . budesonide-formoterol (SYMBICORT) 160-4.5 MCG/ACT inhaler   Inhalation   Inhale 1 puff into the lungs 2 (two) times daily.         Marland Kitchen  diltiazem (CARDIZEM) 120 MG tablet   Oral   Take 120 mg by mouth 2 (two) times daily.         Marland Kitchen loratadine (CLARITIN) 10 MG tablet   Oral   Take 10 mg by mouth daily as needed for allergies.          . Multiple Vitamin (MULTIVITAMIN) tablet   Oral   Take 1 tablet by mouth daily.           Marland Kitchen OVER THE COUNTER MEDICATION   Both Eyes   Place 1 drop into both eyes daily as needed (dry or itchy eyes).         . metoCLOPramide (REGLAN) 10 MG tablet   Oral   Take 1 tablet (10 mg total) by mouth every 6 (six) hours.   20 tablet   0   . Respiratory Therapy Supplies (FLUTTER) DEVI   Does not apply   1 Device by Does not apply route QID.   1 each   0   . traMADol (ULTRAM) 50 MG tablet   Oral   Take 1 tablet (50 mg  total) by mouth every 6 (six) hours as needed for pain.   15 tablet   0     BP 123/68  Pulse 98  Temp(Src) 98.4 F (36.9 C) (Oral)  Resp 16  SpO2 100%  Physical Exam  Constitutional: She is oriented to person, place, and time. She appears well-developed and well-nourished. She appears distressed.  HENT:  Head: Normocephalic and atraumatic.  Mucous membranes dry.   Eyes: Conjunctivae and EOM are normal. Pupils are equal, round, and reactive to light.  Neck: Normal range of motion.  Cardiovascular: Regular rhythm, normal heart sounds and intact distal pulses.   No murmur heard. Mild tachy  Pulmonary/Chest: Effort normal and breath sounds normal. No respiratory distress.  Abdominal: Soft. Bowel sounds are normal. She exhibits no distension. There is tenderness.  Musculoskeletal: Normal range of motion. She exhibits no edema.  Lymphadenopathy:    She has no cervical adenopathy.  Neurological: She is alert and oriented to person, place, and time. No cranial nerve deficit. She exhibits normal muscle tone. Coordination normal.  Skin: Skin is warm. No rash noted. She is not diaphoretic. No erythema.    ED Course  Procedures (including critical care time)  Labs Reviewed  CBC WITH DIFFERENTIAL - Abnormal; Notable for the following:    RBC 5.48 (*)    Hemoglobin 16.7 (*)    HCT 48.1 (*)    Neutrophils Relative 84 (*)    Lymphocytes Relative 9 (*)    All other components within normal limits  COMPREHENSIVE METABOLIC PANEL - Abnormal; Notable for the following:    Glucose, Bld 108 (*)    GFR calc non Af Amer 85 (*)    All other components within normal limits  URINALYSIS, ROUTINE W REFLEX MICROSCOPIC - Abnormal; Notable for the following:    Leukocytes, UA SMALL (*)    All other components within normal limits  URINE MICROSCOPIC-ADD ON - Abnormal; Notable for the following:    Squamous Epithelial / LPF FEW (*)    Casts HYALINE CASTS (*)    All other components within normal  limits  LIPASE, BLOOD   Dg Chest 2 View  08/18/2012  *RADIOLOGY REPORT*  Clinical Data: Tachycardia and history of hypertension. Asthma/bronchitis.  CHEST - 2 VIEW  Comparison: 08/12/2012  Findings: Midline trachea.  Normal heart size.  Aortic atherosclerosis. No pleural effusion or pneumothorax.  Mild interstitial thickening which is lower lobe predominant. No lobar consolidation.  Mild pleural parenchymal scarring at the left apex. More focal interstitial thickening and probable scarring or atelectasis at the left lung base.  IMPRESSION: No acute cardiopulmonary disease.  Mild interstitial thickening with scattered areas of left-sided scarring.  Findings may relate to the clinical history bronchitis/asthma.   Original Report Authenticated By: Jeronimo Greaves, M.D.    Ct Abdomen Pelvis W Contrast  08/18/2012  *RADIOLOGY REPORT*  Clinical Data: Nausea.  Vomiting.  Diarrhea.  Umbilical abdominal pain.  CT ABDOMEN AND PELVIS WITH CONTRAST  Technique:  Multidetector CT imaging of the abdomen and pelvis was performed following the standard protocol during bolus administration of intravenous contrast.  Contrast: 80mL OMNIPAQUE IOHEXOL 300 MG/ML  SOLN 80  Comparison: 11/23/2010 PET.  Chest CT 06/26/2012  Findings: Lung bases:  Minimal right base nodularity which is unchanged.  Cavitary process in the medial left lung base which is grossly similar on image 1/series 3.  This is incompletely imaged. Mild cardiomegaly, with a small hiatal hernia.  No pericardial or pleural effusion.  Abdomen/pelvis:  Normal liver, spleen.  The gastric antrum appears mildly thick-walled, but is underdistended.  Example image 35/series 2.  Normal pancreas, gallbladder, biliary tract, adrenal glands, kidneys.  The right renal collecting system is at least partially duplicated.  No retroperitoneal or retrocrural adenopathy.  Normal colon and terminal ileum.  Appendix likely identified on image 53/series 2.  No right lower quadrant inflammation  seen.  Normal small bowel without abdominal ascites.  No pelvic adenopathy.    Normal urinary bladder and uterus.  No adnexal mass.  No significant free fluid.  Bones/Musculoskeletal:  Tarlov cysts. No acute osseous abnormality. Disc bulge at L5-S1.  IMPRESSION:  1.  Underdistended gastric antrum.  Concurrent wall thickening, for which gastritis is suspected.  No other explanation for umbilical abdominal pain. 2.  Cavitary process in the left lower lobe is incompletely imaged but was detailed on 06/26/2012.   Original Report Authenticated By: Jeronimo Greaves, M.D.    Results for orders placed during the hospital encounter of 08/18/12  CBC WITH DIFFERENTIAL      Result Value Range   WBC 7.4  4.0 - 10.5 K/uL   RBC 5.48 (*) 3.87 - 5.11 MIL/uL   Hemoglobin 16.7 (*) 12.0 - 15.0 g/dL   HCT 78.4 (*) 69.6 - 29.5 %   MCV 87.8  78.0 - 100.0 fL   MCH 30.5  26.0 - 34.0 pg   MCHC 34.7  30.0 - 36.0 g/dL   RDW 28.4  13.2 - 44.0 %   Platelets 258  150 - 400 K/uL   Neutrophils Relative 84 (*) 43 - 77 %   Neutro Abs 6.2  1.7 - 7.7 K/uL   Lymphocytes Relative 9 (*) 12 - 46 %   Lymphs Abs 0.7  0.7 - 4.0 K/uL   Monocytes Relative 5  3 - 12 %   Monocytes Absolute 0.4  0.1 - 1.0 K/uL   Eosinophils Relative 2  0 - 5 %   Eosinophils Absolute 0.1  0.0 - 0.7 K/uL   Basophils Relative 0  0 - 1 %   Basophils Absolute 0.0  0.0 - 0.1 K/uL  COMPREHENSIVE METABOLIC PANEL      Result Value Range   Sodium 140  135 - 145 mEq/L   Potassium 4.2  3.5 - 5.1 mEq/L   Chloride 102  96 - 112 mEq/L   CO2 28  19 -  32 mEq/L   Glucose, Bld 108 (*) 70 - 99 mg/dL   BUN 16  6 - 23 mg/dL   Creatinine, Ser 1.61  0.50 - 1.10 mg/dL   Calcium 9.4  8.4 - 09.6 mg/dL   Total Protein 8.0  6.0 - 8.3 g/dL   Albumin 3.7  3.5 - 5.2 g/dL   AST 21  0 - 37 U/L   ALT 14  0 - 35 U/L   Alkaline Phosphatase 76  39 - 117 U/L   Total Bilirubin 0.6  0.3 - 1.2 mg/dL   GFR calc non Af Amer 85 (*) >90 mL/min   GFR calc Af Amer >90  >90 mL/min  LIPASE,  BLOOD      Result Value Range   Lipase 32  11 - 59 U/L  URINALYSIS, ROUTINE W REFLEX MICROSCOPIC      Result Value Range   Color, Urine YELLOW  YELLOW   APPearance CLEAR  CLEAR   Specific Gravity, Urine 1.013  1.005 - 1.030   pH 5.5  5.0 - 8.0   Glucose, UA NEGATIVE  NEGATIVE mg/dL   Hgb urine dipstick NEGATIVE  NEGATIVE   Bilirubin Urine NEGATIVE  NEGATIVE   Ketones, ur NEGATIVE  NEGATIVE mg/dL   Protein, ur NEGATIVE  NEGATIVE mg/dL   Urobilinogen, UA 0.2  0.0 - 1.0 mg/dL   Nitrite NEGATIVE  NEGATIVE   Leukocytes, UA SMALL (*) NEGATIVE  URINE MICROSCOPIC-ADD ON      Result Value Range   Squamous Epithelial / LPF FEW (*) RARE   WBC, UA 3-6  <3 WBC/hpf   Bacteria, UA RARE  RARE   Casts HYALINE CASTS (*) NEGATIVE   Urine-Other MUCOUS PRESENT      Date: 08/18/2012  Rate: 108  Rhythm: sinus tachycardia  QRS Axis: normal  Intervals: normal  ST/T Wave abnormalities: nonspecific T wave changes  Conduction Disutrbances:left anterior fascicular block  Narrative Interpretation:   Old EKG Reviewed: unchanged No significant change in EKG compared to 01/17/2011   1. Vomiting and diarrhea   2. Abdominal pain       MDM  Patient in ED for extended period of time awaiting CT Abdomen to rule out acute abdominal surgical process. Main symptoms cw gastroenteritis but with significant abdominal pain CT needed, which ended up being nonspecific. Symptoms most likely viral gastroenteritis. Patient called EMS for Abdominal pain, vomiting and diarrhea of acute onset at 0930. When they arrived patient was noted to be "hypotensive" but BP was not below 90 systolic, and heart rate tachycardic to 160 determined to be SVT. EMS treated patient with 500cc NS and 6 mg adenosine as per their protocols and patient converted to Sinus Tach and BP improved. In ED patient with mild sinus tach to 110's and never had BP problems. Suspect all related to the stress of the presumed viral illness. I had intended on  admission for observation but since in ED for 6 plus hours and stable discharge home justified. Patient improved significantly.         Shelda Jakes, MD 08/20/12 [phone]

## 2012-08-18 NOTE — ED Notes (Signed)
Pt vomited x 1 clear liquid.  Dr. Deretha Emory aware order given.  Ct notified.

## 2012-08-18 NOTE — Telephone Encounter (Signed)
Patient Information:  Caller Name: Janan  Phone: [phone]  Patient: [name]  Gender: Female  DOB: [birth year]  Age: 72 Years  PCP: Plotnikov, Alex (Adults only)  Office Follow Up:  Does the office need to follow up with this patient?: No  Instructions For The Office: N/A  RN Note:  RN spoke to husband and he stated that he can call 911 now and will go to [employer] [facility] via 911;  Symptoms  Reason For Call & Symptoms: Pt is calling and reports that she is having vomiting and diarrhea; sx started 08/18/12;  diarrhea x3;  vomiting x1; no fever;  pulse is 181 at present time per machine that she has at home; tried to take it manually but unable because pulse is "jumping" too fast per pt; no chest pain; feels short of breath now; BP 102/82; reports that she can't get up and walk because she feels faint;  feels short of breath; husbandwith her  Reviewed Health History In EMR: Yes  Reviewed Medications In EMR: Yes  Reviewed Allergies In EMR: Yes  Reviewed Surgeries / Procedures: Yes  Date of Onset of Symptoms: 08/18/2012  Guideline(s) Used:  Fainting  Disposition Per Guideline:   Call EMS 911 Now  Reason For Disposition Reached:   Difficulty breathing  Advice Given:  N/A  Patient Will Follow Care Advice:  YES

## 2012-08-18 NOTE — ED Notes (Signed)
Pt stated that her nausea is much better, and that she is able to hold down water given 20 minutes prior

## 2012-08-18 NOTE — ED Provider Notes (Signed)
Pt received from Dr. Deretha Emory.  72yo F presented to ED w/ N/V/D and abdominal pain.  CT abd/pelvis findings consistent w/ gastritis.  Results discussed w/ pt and her husband.  Pt requested another dose of nausea medicine; had had 2 episodes of vomiting since last dose of zofran.  Tried 10mg  IV reglan, nausea improved and pt tolerating pos.  VSS.  Recommended f/u with PCP.  Return precautions discussed.   Otilio Miu, PA-C 08/18/12 2329

## 2012-08-19 NOTE — ED Provider Notes (Signed)
Medical screening examination/treatment/procedure(s) were conducted as a shared visit with non-physician practitioner(s) and myself.  I personally evaluated the patient during the encounter  See my main note.   Shelda Jakes, MD 08/19/12 1130

## 2012-08-20 NOTE — Assessment & Plan Note (Addendum)
Subjectively she is stable or improved. Not able to demonstrate reflux or aspiration. Seems to be bronchiectasis with retention cyst. There has been change over time so we will continue to monitor images. Plan-chest x-ray. Discussed portable oxygen concentrator to take on trips.

## 2012-08-22 NOTE — Progress Notes (Signed)
Quick Note:  Pt aware of results. ______ 

## 2012-08-29 ENCOUNTER — Other Ambulatory Visit: Payer: Self-pay | Admitting: Internal Medicine

## 2012-09-19 ENCOUNTER — Encounter: Payer: Self-pay | Admitting: Internal Medicine

## 2012-09-19 ENCOUNTER — Ambulatory Visit: Payer: Medicare Other

## 2012-09-19 ENCOUNTER — Ambulatory Visit (INDEPENDENT_AMBULATORY_CARE_PROVIDER_SITE_OTHER): Payer: Medicare Other | Admitting: Internal Medicine

## 2012-09-19 VITALS — BP 142/80 | HR 75 | Ht 62.0 in | Wt 124.8 lb

## 2012-09-19 DIAGNOSIS — J479 Bronchiectasis, uncomplicated: Secondary | ICD-10-CM

## 2012-09-19 MED ORDER — LEVALBUTEROL HCL 0.63 MG/3ML IN NEBU
0.6300 mg | INHALATION_SOLUTION | Freq: Once | RESPIRATORY_TRACT | Status: AC
  Administered 2012-09-19: 0.63 mg via RESPIRATORY_TRACT

## 2012-09-19 NOTE — Patient Instructions (Addendum)
Order- future schedule CT chest, non-contrast  To be done in September before next ov    Dx bronchiectasis  Order- lab Hypersensitivity pneumonia panel      Dx bronchiectasis                  Fungal IgE panel  Neb xop 0.63

## 2012-09-19 NOTE — Progress Notes (Signed)
Subjective:    Patient ID: [name], female    DOB: [birth year], 72 y.o.   MRN: [id number]  HPI 10/04/10-72 yoF never smoker with hx of asthma, left hilar mass/ PET negative.. Last here in 2008 after hospital stay, when she saw [name] noting issues of hypertension and asthma meds. Notes reviewed. Seen now on referral by [name] after recent flare with shortness of breath, productive cough. She tends to feel better as long as she is on an antibiotic or steroids. Within a couple of weeks off, she will start to cough and get tight again. Had bronchitis as a child. In the last 5-6 years wheezing dyspnea has been more perennial. Occasionally wakes with cough. Sputum often yellow. Has had pneumonia x 2 with pneumovax. Little GERD or acid indigestion recognized. Triggers have included strong odors, seasonal pollens, chest colds, but not house dust. Had a sinus infection during the winter. Has been treated for heart rhythm and BP, but denies MI/ angina. She is using Symbicort 160 now and considers it sufficient used twice every day. She did not fill a script for a Proair rescue inhaler. 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  11/10/10- 72 yoF never smoker with hx of asthma, left hilar mass/ PET neg in 2008/ now progressive, RML atelectasis. CT 11/03/10- showed change progression since prior studies of 2008, when abnormalities / PET negative, were attributed to scarring. -Now 2.6 x 2,2 cm left hilar mass, slowly growing. Post obstructive LLL opacity. New RML collapse. Scattered bronchiectasis. No effusion. Denies fever, night sweat, nodes. Cough has been a little less, with brownish to clear mucus, no blood. Has to chew deliberately to avoid choking. Has rarely awakened with hard cough. Symbicort has helped cough. No weight loss. No chest pain, but some shifting back pain. We reviewed her images and the technique and risks of bronchoscopy.   12/12/10- 72 yoF never smoker with hx of asthma, left hilar mass/ PET  neg in 2008/ now progressive, RML atelectasis. PET neg 2012. Husband here  Post bronchoscopy f/u - Bronch 11/30/10- necrotic appearing mass occluding left lower lobe bronchus.  Path- small lymphocytes and eosinophils, neg for malignancy or granuloma. Cultures negative so far.  She still coughs up phlegm, but less than before bronchoscopy.  Aware of a variable nodule in left axilla- comes and goes, otherwise no lumps, nodes or etc. Occasional sweat on morning waking, otherwise no fever or sweat and no pain or blood. She has been aware "something is going on" but not toxic.   11/23/11-  72 yoF never smoker with hx of asthma, left hilar mass/ PET neg in 2008/ now progressive, RML atelectasis. PET neg 2012.  She says she is doing "fair". She has had bronchoscopy/ [name] after me ."Benign inflammatory"  Coughs more with stress. She had gone to emergency room June 8 with anxiety and shortness of breath. We subsequently called in prednisone taper. She denies fever, sweat, adenopathy or chest pain. Often short of breath if anxious. She avoids exertion such as climbing stairs. Prednisone helped cough more productive and she feels better having taken it. Phlegm now is yellow, sometimes darker with no blood. She says that "a bug bite escalates my anxiety". Lab-eosinophils 8%, always high. CXR 10/06/11 IMPRESSION:  No acute infiltrate or pulmonary edema. There is a cavitary lesion  in the left lower lobe retrocardiac region measures at least 2 cm.  Further evaluation with enhanced CT of the chest is recommended.  Original Report Authenticated By: [name]  POP, M.D.    12/25/11-  72 yoF never smoker with hx of asthma, left hilar mass/ PET neg in 2008/ now progressive, RML atelectasis. PET neg 2012.  States breathing has gotten better since last ov; review CT with patient in more detail Off prednisone since July 26. Did not need Symbicort. Cough produces some light yellow sputum but she denies fever, sweat, chest  pain. She wakes coughing occasionally. CT chest 12/25/11- images reviewed with her: IMPRESSION:  Interval decrease in size of the left infrahilar lesion which has  become air-filled in the interval. It is possible that this could  represent some central cystic bronchiectasis or decompression of  the previous fluid density nodule.  Interval re-expansion of the right middle lobe.  No new or progressive findings.  Original Report Authenticated By: [name], M.D.   06/24/12- 72 yoF never smoker with hx of asthma, left hilar mass/ PET neg in 2008/ then progressive, RML atelectasis. PET neg 2012.  FOLLOWS FOR: was doing pretty good until today-started having increased SOB ; unsure if wheezing. Family here With persistent questioning we determined that for the past month she had been noticing orthopnea, propping up to sleep. No palpitation ankle edema. Over the last 3 or 4 days she began noting dry throat and some mild low back ache. She had had diarrhea for a few days after church parking in mid February but that had resolved except for some residual urgency. No fever, chills. Sputum usually white, rarely brown. No chest pain, blood or swollen glands  07/08/12 72 yoF never smoker with hx of asthma, left hilar mass/ PET neg in 2008/ then progressive, RML atelectasis. PET neg 2012.    Son here FOLLOWS FOR: states breathing is slightly better since 2 weeks ago; ROOM AIR O2 of 90% upon arrival She feels she is doing better she comes in looking well dressed, stronger and comfortable occasional productive cough, scant clear sputum or little yellow-brown. No blood. Denies chest pain, fever or sweat. She is carrying her oxygen. Labs 06/24/12- CBC normaal except eosinophilia 9%. LFTs normal. Chemistry normal except CO2 34 suggesting compensation for respiratory acidosis. CT chest 06/26/12- images were reviewed with her and her son. IMPRESSION:  1. No evidence for acute pulmonary embolus.  2. Ground-glass  attenuation and peripheral nodular consolidation  within the right lower lobe consistent with pneumonitis. Findings  likely sequela of aspiration and/or pneumonia.  3. Cavitary lesion versus cystic bronchiectasis within the  perihilar left lower lobe. That this is unchanged in size from  previous exam but now contains a central soft tissue attenuating  filling defect which may represent a mycetoma.  4. Diffuse, bilateral bronchial wall thickening which is favored  to represent the sequela of chronic aspiration and bronchitis.  Original Report Authenticated By: Kerby Moors, M.D.  08/12/12- 16 yoF never smoker with hx of asthma, left hilar mass/ PET neg in 2008/ then progressive, RML atelectasis. PET neg 2012.    Son here FOLLOWS FOR: review Barium swallow results in detail with patient; having SOB and wheezing-worse with activity She reports feeling much better compared with last winter. Still some cough and shortness of breath white or slightly brown sputum. Dyspnea with vigorous exertion/stairs. Denies sweats or fever, chest pain or palpitation. Steroids make her feel better. Sleeps with O2 2L/ [facility]. Barium swallow 07/15/12- negative for evidence of reflux or aspiration.  09/19/12- 55 yoF never smoker with hx of asthma/ bronchiectasis, left hilar mass/cyst PET neg in 2008/ then progressive, RML  atelectasis. PET neg 2012.     FOLLOWS FOR: dry nasal area, watery eyes, cough that just started today. Strong perfume made her nose run. Eyes fdry CXR 08/22/12 IMPRESSION:  No edema or consolidation. The lesions seen recently on CT in the  left infrahilar region is not seen on chest radiography. In this  regard, suggest correlation with chest CT to further assess this  area on the left.  Original Report Authenticated By: Bretta Bang, M.D.  ROS-see HPI Constitutional:   No-   weight loss, night sweats, fevers, chills, fatigue, lassitude. HEENT:   No-  headaches, difficulty swallowing,  tooth/dental problems, sore throat,       No-  sneezing, itching, ear ache, nasal congestion, post nasal drip,  CV:  No-   chest pain, +orthopnea, no-PND, swelling in lower extremities, anasarca,  dizziness, palpitations Resp: +   shortness of breath with exertion or at rest.              +  productive cough,  + non-productive cough,  No- coughing up of blood.              +   change in color of mucus.  No- wheezing.   Skin: No-   rash or lesions. GI:  No-   heartburn, indigestion, abdominal pain, nausea, vomiting,  GU: . MS:  No-   joint pain or swelling.   Neuro-     nothing unusual Psych:  No- change in mood or affect. No depression or anxiety.  No memory loss.  OBJ- Physical Exam General- Alert, Oriented, Affect-appropriate, Distress- none acute. Thin Skin- rash-none, lesions- none, excoriation- none   Lymphadenopathy- none Head- atraumatic            Eyes- Gross vision intact, PERRLA, conjunctivae and secretions clear            Ears- +somewhat hard of hearing            Nose- Clear, no-Septal dev, mucus, polyps, erosion, perforation             Throat- Mallampati II , mucosa clear , drainage- none, tonsils- atrophic Neck- flexible , trachea midline, no stridor , thyroid nl, carotid no bruit Chest - symmetrical excursion , unlabored           Heart/CV- RRR , no murmur , no gallop  , no rub, nl s1 s2                           - JVD- none , edema- none, stasis changes- none, varices- none           Lung- + wheeze and squeaks, unlabored, cough- none , dullness-none, rub- none           Chest wall-  Abd- Br/ Gen/ Rectal- Not done, not indicated Extrem- cyanosis- none, clubbing, none, atrophy- none, strength- nl Neuro- grossly intact to observation

## 2012-09-24 LAB — OTHER SOLSTAS TEST
Cladosporium Herbarum: <0.1 kU/L
Penicillium Notatum: <0.1 kU/L

## 2012-09-27 LAB — HYPERSENSITIVITY PNUEMONITIS PROFILE

## 2012-10-02 NOTE — Assessment & Plan Note (Signed)
Consider possible large retention cysts to explain imaging.  Plan- repeat CT in Fall. Discussed use of nebulizer.

## 2012-10-09 NOTE — Progress Notes (Signed)
Quick Note:  Called spoke with patient, advised of lab results as stated by CY. Pt verbalized her understanding and denied any questions. ______

## 2012-12-30 ENCOUNTER — Other Ambulatory Visit: Payer: Medicare Other

## 2013-01-13 ENCOUNTER — Other Ambulatory Visit: Payer: Medicare Other

## 2013-01-20 ENCOUNTER — Ambulatory Visit: Payer: Medicare Other | Admitting: Internal Medicine

## 2013-04-10 ENCOUNTER — Telehealth: Payer: Self-pay | Admitting: Internal Medicine

## 2013-04-10 ENCOUNTER — Encounter: Payer: Self-pay | Admitting: Internal Medicine

## 2013-04-10 ENCOUNTER — Ambulatory Visit (INDEPENDENT_AMBULATORY_CARE_PROVIDER_SITE_OTHER): Payer: Medicare Other | Admitting: Internal Medicine

## 2013-04-10 VITALS — BP 140/82 | HR 92 | Temp 97.6°F | Resp 16 | Ht 63.0 in | Wt 129.0 lb

## 2013-04-10 DIAGNOSIS — I10 Essential (primary) hypertension: Secondary | ICD-10-CM

## 2013-04-10 DIAGNOSIS — J45909 Unspecified asthma, uncomplicated: Secondary | ICD-10-CM

## 2013-04-10 DIAGNOSIS — Z0000 Encounter for general adult medical examination without abnormal findings: Secondary | ICD-10-CM | POA: Insufficient documentation

## 2013-04-10 DIAGNOSIS — Z23 Encounter for immunization: Secondary | ICD-10-CM

## 2013-04-10 DIAGNOSIS — J479 Bronchiectasis, uncomplicated: Secondary | ICD-10-CM

## 2013-04-10 NOTE — Assessment & Plan Note (Signed)
Continue with current prescription therapy as reflected on the Med list.  

## 2013-04-10 NOTE — Progress Notes (Signed)
   Subjective:  The patient is here for a wellness exam. The patient has been doing well overall without major physical or psychological issues going on lately.  On O2 now  HPI  The patient presents for a follow-up of  Chronic asthma/COPD  Review of Systems  Constitutional: Positive for fatigue. Negative for activity change, appetite change and unexpected weight change.  HENT: Negative for mouth sores and sinus pressure.   Eyes: Negative for visual disturbance.  Respiratory: Positive for choking and chest tightness.   Genitourinary: Negative for frequency, difficulty urinating and vaginal pain.  Musculoskeletal: Negative for back pain and gait problem.  Skin: Negative for pallor.  Neurological: Negative for dizziness, tremors, weakness and numbness.  Psychiatric/Behavioral: Negative for confusion and sleep disturbance.       Objective:   Physical Exam  Constitutional: She appears well-developed and well-nourished. No distress.  Coughing   HENT:  Head: Normocephalic.  Right Ear: External ear normal.  Left Ear: External ear normal.  Nose: Nose normal.  Mouth/Throat: Oropharynx is clear and moist.  Eyes: Conjunctivae are normal. Pupils are equal, round, and reactive to light. Right eye exhibits no discharge. Left eye exhibits no discharge.  Neck: Normal range of motion. Neck supple. No JVD present. No tracheal deviation present. No thyromegaly present.  Cardiovascular: Normal rate, regular rhythm and normal heart sounds.   Pulmonary/Chest: No stridor. No respiratory distress. She has wheezes.  Abdominal: Soft. Bowel sounds are normal. She exhibits no distension and no mass. There is no tenderness. There is no rebound and no guarding.  Musculoskeletal: She exhibits no edema and no tenderness.  Lymphadenopathy:    She has no cervical adenopathy.  Neurological: She displays normal reflexes. No cranial nerve deficit. She exhibits normal muscle tone. Coordination normal.  Skin: No  rash noted. No erythema.  Psychiatric: She has a normal mood and affect. Her behavior is normal. Judgment and thought content normal.    Lab Results  Component Value Date   WBC 7.4 08/18/2012   HGB 16.7* 08/18/2012   HCT 48.1* 08/18/2012   PLT 258 08/18/2012   GLUCOSE 108* 08/18/2012   CHOL 205* 01/19/2010   TRIG 83.0 01/19/2010   HDL 49.00 01/19/2010   LDLDIRECT 138.6 01/19/2010   LDLCALC 97 11/08/2008   ALT 14 08/18/2012   AST 21 08/18/2012   NA 140 08/18/2012   K 4.2 08/18/2012   CL 102 08/18/2012   CREATININE 0.71 08/18/2012   BUN 16 08/18/2012   CO2 28 08/18/2012   TSH 1.01 01/19/2010   INR 1.02 01/17/2011   HGBA1C  Value: 6.2 (NOTE) The ADA recommends the following therapeutic goal for glycemic control related to Hgb A1c measurement: Goal of therapy: <6.5 Hgb A1c  Reference: American Diabetes Association: Clinical Practice Recommendations 2010, Diabetes Care, 2010, 33: (Suppl  1).* 10/31/2008   Bronchosc and x rays reviewed        Assessment & Plan:

## 2013-04-10 NOTE — Progress Notes (Signed)
Pre visit review using our clinic review tool, if applicable. No additional management support is needed unless otherwise documented below in the visit note. 

## 2013-04-10 NOTE — Assessment & Plan Note (Signed)
Continue with current prescription therapy as reflected on the Med list. O2 

## 2013-04-10 NOTE — Telephone Encounter (Signed)
Pt was calling with heart rate of 135. RN called and left vm.

## 2013-04-10 NOTE — Telephone Encounter (Signed)
Patient Information:  Caller Name: Arnola  Phone: [phone]  Patient: [name]  Gender: Female  DOB: 1940-08-21  Age: 72 Years  PCP: Plotnikov, Alex (Adults only)  Office Follow Up:  Does the office need to follow up with this patient?: No  Instructions For The Office: N/A   Symptoms  Reason For Call & Symptoms: Had Pneumonia Booster today  @ 1200  04/10/13 and started to noticed elevated HR at 1600. She put her on O2 @ 2L/M  and HR= 135 and her teeth were chattering. She drank some water and HR slowly came down to 99. She warmed herself- temp = 97.6. HR is now 73 and O2 Sat is 95%, and she is feeling a little better.  Reviewed Health History In EMR: Yes  Reviewed Medications In EMR: Yes  Reviewed Allergies In EMR: Yes  Reviewed Surgeries / Procedures: Yes  Date of Onset of Symptoms: 04/10/2013  Treatments Tried: put on O2, rested, had some water  Treatments Tried Worked: No  Guideline(s) Used:  Heart Rate and Heartbeat Questions  Disposition Per Guideline:   See Today in Office  Reason For Disposition Reached:   Age > 60 years  Advice Given:  Expected Course  : If your symptoms do not improve over the next couple days then you should make an appointment to see your doctor.  Reassurance  Patients with anxiety or stress may describe a "rapid heartbeat" or "pounding" in their chest from their heart beating.  Health Basics  Sleep: Try to get sufficient amount of sleep. Lack of sleep can aggravate palpitations. Most people need 7-8 hours of sleep each night.  Diet: Eat a balanced healthy diet.  Liquid Intake: Drink adequate liquids, 6-8 glasses of water daily.  Avoid Caffeine  Avoid caffeine-containing beverages (Reason: caffeine is a stimulant and can aggravate palpitations).  Expected Course  : If your symptoms do not improve over the next couple days then you should make an appointment to see your doctor.  Call Back If:  Chest pain, lightheadedness, or difficulty  breathing occurs  Heart beating more than 130 beats / minute  More than 3 extra or skipped beats / minute  Patient Will Follow Care Advice:  YES  Appointment Scheduled:  04/11/2013 10:30:00 Appointment Scheduled Provider:  Sonda Primes (Adults only)

## 2013-04-10 NOTE — Assessment & Plan Note (Addendum)
Here for medicare wellness/physical  Diet: heart healthy  Physical activity: not sedentary  Depression/mood screen: negative now Hearing: intact to whispered voice  Visual acuity: grossly normal, performs annual eye exam  ADLs: capable w/O2 Fall risk: none  Home safety: good  Cognitive evaluation: intact to orientation, naming, recall and repetition  EOL planning: adv directives, full code/ I agree  I have personally reviewed and have noted  1. The patient's medical and social history  2. Their use of alcohol, tobacco or illicit drugs  3. Their current medications and supplements  4. The patient's functional ability including ADL's, fall risks, home safety risks and hearing or visual impairment.  5. Diet and physical activities  6. Evidence for depression or mood disorders    Today patient counseled on age appropriate routine health concerns for screening and prevention, each reviewed and up to date or declined. Immunizations reviewed and up to date or declined. Labs ordered and reviewed. Risk factors for depression reviewed and negative. Hearing function and visual acuity are intact. ADLs screened and addressed as needed. Functional ability and level of safety reviewed and appropriate. Education, counseling and referrals performed based on assessed risks today. Patient provided with a copy of personalized plan for preventive services.   Pt declined a flu shot Will give a Prevnar

## 2013-04-11 ENCOUNTER — Encounter: Payer: Self-pay | Admitting: Internal Medicine

## 2013-04-11 ENCOUNTER — Ambulatory Visit (INDEPENDENT_AMBULATORY_CARE_PROVIDER_SITE_OTHER): Payer: Medicare Other | Admitting: Internal Medicine

## 2013-04-11 VITALS — BP 130/80 | HR 82 | Temp 97.1°F | Ht 63.0 in | Wt 123.0 lb

## 2013-04-11 DIAGNOSIS — R000 Tachycardia, unspecified: Secondary | ICD-10-CM

## 2013-04-11 DIAGNOSIS — R05 Cough: Secondary | ICD-10-CM

## 2013-04-11 NOTE — Progress Notes (Signed)
Pre-visit discussion using our clinic review tool. No additional management support is needed unless otherwise documented below in the visit note.  

## 2013-04-11 NOTE — Assessment & Plan Note (Signed)
Has been chronic Doesn't seem to be different symbicort can cause cough but likely from her bronchiectasis No action needed

## 2013-04-11 NOTE — Assessment & Plan Note (Addendum)
Occurred several hours after pneumovax Also transient localized hives on right forearm EKG shows sinus rhythm--my only concern would be atrial flutter--- 2:1 yesterday and 4:1 now (but is sinus) Will just watch for now

## 2013-04-11 NOTE — Progress Notes (Signed)
Subjective:    Patient ID: Fernande Bras, female    DOB: 11-14-1940, 72 y.o.   MRN: [id number]  HPI Here with husband  Had reaction to pneumovax Several hours later she noted elevated pulse to 135 Sat and drank water and it settled down--after 15-20 minutes Felt cold and "chattering" Temp 97.6 Felt her oximetry was down--she put her oxygen on then Felt the palpitation but no chest pain Has been okay since then Has had tachycardia in past but not arrhythmia  Broke out in hives on right forearm After the shot also Mostly resolved now  Chronic cough and mucus Can be a lot at times Noticed it "more like a cough to bring things up" yesterday  Current Outpatient Prescriptions on File Prior to Visit  Medication Sig Dispense Refill  . acetaminophen (TYLENOL) 325 MG tablet Take 325 mg by mouth every 6 (six) hours as needed. For pain      . albuterol (PROAIR HFA) 108 (90 BASE) MCG/ACT inhaler Inhale 2 puffs into the lungs every 4 (four) hours as needed for wheezing or shortness of breath. For shortness of breath  1 Inhaler  prn  . Ascorbic Acid (VITAMIN C PO) Take 1 tablet by mouth daily.       . Azelastine-Fluticasone 137-50 MCG/ACT SUSP Place 1 Act into the nose daily as needed (allergy congestion).       . B Complex Vitamins (VITAMIN B COMPLEX PO) Take 1 tablet by mouth daily as needed (takes when she remembers to take it).       . budesonide-formoterol (SYMBICORT) 160-4.5 MCG/ACT inhaler Inhale 1 puff into the lungs 2 (two) times daily.      Marland Kitchen diltiazem (CARDIZEM) 120 MG tablet Take 120 mg by mouth 2 (two) times daily.      . Elastic Bandages & Supports (O-2 SUSPENSORY) MISC by Does not apply route as needed.      . loratadine (CLARITIN) 10 MG tablet Take 10 mg by mouth daily as needed for allergies.       . Multiple Vitamin (MULTIVITAMIN) tablet Take 1 tablet by mouth daily.         No current facility-administered medications on file prior to visit.    Allergies  Allergen  Reactions  . Penicillins Swelling    Throat swelling  . Shellfish Allergy Anaphylaxis  . Sulfonamide Derivatives Swelling    Throat swelling  . Aspirin Other (See Comments)    States stomach bubbles, becomes gaseous and irritated  . Clindamycin     REACTION: Neck, tongue swelling, SOB \\T \ rash  . Fluticasone-Salmeterol     REACTION: hoarseness  . Fruit & Vegetable Daily [Nutritional Supplements]     Tongue swelling, vomiting  . Montelukast Sodium     REACTION: hallucination    Past Medical History  Diagnosis Date  . Anxiety   . Asthma   . HTN (hypertension)   . Allergic rhinitis     Past Surgical History  Procedure Laterality Date  . Tubal ligation      Family History  Problem Relation Age of Onset  . Diabetes Father   . Asthma Father   . Allergies Father   . Mental illness Mother     alzheimer's  . Allergies Brother   . Allergies Brother   . Heart disease Maternal Aunt     History   Social History  . Marital Status: Married    Spouse Name: N/A    Number of Children: 4  .  Years of Education: N/A   Occupational History  . Bank Teller    Social History Main Topics  . Smoking status: Never Smoker   . Smokeless tobacco: Never Used     Comment: father smoked, worked w/smokers  . Alcohol Use: No  . Drug Use: No  . Sexual Activity: Not on file   Other Topics Concern  . Not on file   Social History Narrative   Regular exercise - YES   Review of Systems No vomiting or diarrhea Appetite off yesterday     Objective:   Physical Exam  Constitutional: She appears well-developed. No distress.  Neck: Normal range of motion. Neck supple. No thyromegaly present.  Cardiovascular: Normal rate, regular rhythm and normal heart sounds.  Exam reveals no gallop.   No murmur heard. PMI noticeable but not really displaced  Pulmonary/Chest: Effort normal. No respiratory distress. She has no wheezes. She has no rales.  Decreased breath sounds but clear    Musculoskeletal: She exhibits no edema.  Lymphadenopathy:    She has no cervical adenopathy.  Skin: No rash noted.  No hives seen now          Assessment & Plan:

## 2013-04-12 ENCOUNTER — Encounter: Payer: Self-pay | Admitting: Internal Medicine

## 2013-04-16 ENCOUNTER — Ambulatory Visit: Payer: Medicare Other

## 2013-07-25 ENCOUNTER — Encounter (HOSPITAL_COMMUNITY): Payer: Self-pay | Admitting: Emergency Medicine

## 2013-07-25 ENCOUNTER — Inpatient Hospital Stay (HOSPITAL_COMMUNITY)
Admission: EM | Admit: 2013-07-25 | Discharge: 2013-07-28 | DRG: 193 | Disposition: A | Discharge status: Home / Self Care | Payer: Medicare Other | Attending: Internal Medicine | Admitting: Internal Medicine

## 2013-07-25 ENCOUNTER — Emergency Department (HOSPITAL_COMMUNITY): Payer: Medicare Other

## 2013-07-25 DIAGNOSIS — J45901 Unspecified asthma with (acute) exacerbation: Secondary | ICD-10-CM

## 2013-07-25 DIAGNOSIS — Z825 Family history of asthma and other chronic lower respiratory diseases: Secondary | ICD-10-CM

## 2013-07-25 DIAGNOSIS — R0609 Other forms of dyspnea: Secondary | ICD-10-CM

## 2013-07-25 DIAGNOSIS — F411 Generalized anxiety disorder: Secondary | ICD-10-CM

## 2013-07-25 DIAGNOSIS — J9620 Acute and chronic respiratory failure, unspecified whether with hypoxia or hypercapnia: Secondary | ICD-10-CM

## 2013-07-25 DIAGNOSIS — R059 Cough, unspecified: Secondary | ICD-10-CM

## 2013-07-25 DIAGNOSIS — R498 Other voice and resonance disorders: Secondary | ICD-10-CM

## 2013-07-25 DIAGNOSIS — F329 Major depressive disorder, single episode, unspecified: Secondary | ICD-10-CM

## 2013-07-25 DIAGNOSIS — R0989 Other specified symptoms and signs involving the circulatory and respiratory systems: Secondary | ICD-10-CM

## 2013-07-25 DIAGNOSIS — Z888 Allergy status to other drugs, medicaments and biological substances status: Secondary | ICD-10-CM

## 2013-07-25 DIAGNOSIS — Z881 Allergy status to other antibiotic agents status: Secondary | ICD-10-CM

## 2013-07-25 DIAGNOSIS — N3090 Cystitis, unspecified without hematuria: Secondary | ICD-10-CM

## 2013-07-25 DIAGNOSIS — I10 Essential (primary) hypertension: Secondary | ICD-10-CM

## 2013-07-25 DIAGNOSIS — J45909 Unspecified asthma, uncomplicated: Secondary | ICD-10-CM

## 2013-07-25 DIAGNOSIS — Z882 Allergy status to sulfonamides status: Secondary | ICD-10-CM

## 2013-07-25 DIAGNOSIS — Z880 Allergy status to penicillin: Secondary | ICD-10-CM

## 2013-07-25 DIAGNOSIS — R21 Rash and other nonspecific skin eruption: Secondary | ICD-10-CM

## 2013-07-25 DIAGNOSIS — Z886 Allergy status to analgesic agent status: Secondary | ICD-10-CM

## 2013-07-25 DIAGNOSIS — R05 Cough: Secondary | ICD-10-CM

## 2013-07-25 DIAGNOSIS — Z833 Family history of diabetes mellitus: Secondary | ICD-10-CM

## 2013-07-25 DIAGNOSIS — J189 Pneumonia, unspecified organism: Principal | ICD-10-CM

## 2013-07-25 DIAGNOSIS — H6120 Impacted cerumen, unspecified ear: Secondary | ICD-10-CM

## 2013-07-25 DIAGNOSIS — J069 Acute upper respiratory infection, unspecified: Secondary | ICD-10-CM

## 2013-07-25 DIAGNOSIS — Z9851 Tubal ligation status: Secondary | ICD-10-CM

## 2013-07-25 DIAGNOSIS — Z0000 Encounter for general adult medical examination without abnormal findings: Secondary | ICD-10-CM

## 2013-07-25 DIAGNOSIS — F3289 Other specified depressive episodes: Secondary | ICD-10-CM

## 2013-07-25 DIAGNOSIS — IMO0002 Reserved for concepts with insufficient information to code with codable children: Secondary | ICD-10-CM

## 2013-07-25 DIAGNOSIS — Z91038 Other insect allergy status: Secondary | ICD-10-CM

## 2013-07-25 DIAGNOSIS — J479 Bronchiectasis, uncomplicated: Secondary | ICD-10-CM

## 2013-07-25 DIAGNOSIS — Z79899 Other long term (current) drug therapy: Secondary | ICD-10-CM

## 2013-07-25 DIAGNOSIS — R000 Tachycardia, unspecified: Secondary | ICD-10-CM

## 2013-07-25 DIAGNOSIS — Z8249 Family history of ischemic heart disease and other diseases of the circulatory system: Secondary | ICD-10-CM

## 2013-07-25 DIAGNOSIS — M79609 Pain in unspecified limb: Secondary | ICD-10-CM

## 2013-07-25 DIAGNOSIS — J309 Allergic rhinitis, unspecified: Secondary | ICD-10-CM

## 2013-07-25 DIAGNOSIS — Z91013 Allergy to seafood: Secondary | ICD-10-CM

## 2013-07-25 LAB — URINALYSIS, ROUTINE W REFLEX MICROSCOPIC
BILIRUBIN URINE: NEGATIVE
Glucose, UA: NEGATIVE mg/dL
Hgb urine dipstick: NEGATIVE
Ketones, ur: 15 mg/dL — AB
NITRITE: NEGATIVE
PROTEIN: NEGATIVE mg/dL
Specific Gravity, Urine: 1.02 (ref 1.005–1.030)
UROBILINOGEN UA: 0.2 mg/dL (ref 0.0–1.0)
pH: 6.5 (ref 5.0–8.0)

## 2013-07-25 LAB — CBC WITH DIFFERENTIAL/PLATELET
Basophils Absolute: 0.1 10*3/uL (ref 0.0–0.1)
Basophils Relative: 1 % (ref 0–1)
EOS ABS: 0.6 10*3/uL (ref 0.0–0.7)
EOS PCT: 9 % — AB (ref 0–5)
HCT: 46.4 % — ABNORMAL HIGH (ref 36.0–46.0)
HEMOGLOBIN: 15.8 g/dL — AB (ref 12.0–15.0)
LYMPHS ABS: 2.8 10*3/uL (ref 0.7–4.0)
LYMPHS PCT: 39 % (ref 12–46)
MCH: 30.7 pg (ref 26.0–34.0)
MCHC: 34.1 g/dL (ref 30.0–36.0)
MCV: 90.3 fL (ref 78.0–100.0)
MONOS PCT: 9 % (ref 3–12)
Monocytes Absolute: 0.6 10*3/uL (ref 0.1–1.0)
Neutro Abs: 3.1 10*3/uL (ref 1.7–7.7)
Neutrophils Relative %: 42 % — ABNORMAL LOW (ref 43–77)
PLATELETS: 338 10*3/uL (ref 150–400)
RBC: 5.14 MIL/uL — ABNORMAL HIGH (ref 3.87–5.11)
RDW: 13.7 % (ref 11.5–15.5)
WBC: 7.2 10*3/uL (ref 4.0–10.5)

## 2013-07-25 LAB — COMPREHENSIVE METABOLIC PANEL
ALT: 14 U/L (ref 0–35)
AST: 24 U/L (ref 0–37)
Albumin: 4.2 g/dL (ref 3.5–5.2)
Alkaline Phosphatase: 75 U/L (ref 39–117)
BUN: 11 mg/dL (ref 6–23)
CALCIUM: 10.2 mg/dL (ref 8.4–10.5)
CO2: 31 meq/L (ref 19–32)
Chloride: 95 mEq/L — ABNORMAL LOW (ref 96–112)
Creatinine, Ser: 0.73 mg/dL (ref 0.50–1.10)
GFR, EST NON AFRICAN AMERICAN: 83 mL/min — AB (ref >90)
GLUCOSE: 96 mg/dL (ref 70–99)
Potassium: 3.9 mEq/L (ref 3.7–5.3)
SODIUM: 142 meq/L (ref 137–147)
Total Bilirubin: 0.6 mg/dL (ref 0.3–1.2)
Total Protein: 8.4 g/dL — ABNORMAL HIGH (ref 6.0–8.3)

## 2013-07-25 LAB — I-STAT TROPONIN, ED: TROPONIN I, POC: 0.01 ng/mL (ref 0.00–0.08)

## 2013-07-25 LAB — INFLUENZA PANEL BY PCR (TYPE A & B)
H1N1FLUPCR: NOT DETECTED
INFLAPCR: NEGATIVE
INFLBPCR: NEGATIVE

## 2013-07-25 LAB — I-STAT CG4 LACTIC ACID, ED: LACTIC ACID, VENOUS: 1.42 mmol/L (ref 0.5–2.2)

## 2013-07-25 LAB — URINE MICROSCOPIC-ADD ON

## 2013-07-25 LAB — STREP PNEUMONIAE URINARY ANTIGEN: Strep Pneumo Urinary Antigen: NEGATIVE

## 2013-07-25 MED ORDER — DEXTROSE 5 % IV SOLN: 500.0000 mg | Freq: Once | INTRAVENOUS | Status: DC

## 2013-07-25 MED ORDER — IPRATROPIUM-ALBUTEROL 0.5-2.5 (3) MG/3ML IN SOLN
3.0000 mL | Freq: Four times a day (QID) | RESPIRATORY_TRACT | Status: DC
  Administered 2013-07-25 – 2013-07-26 (×2): 3 mL via RESPIRATORY_TRACT
  Filled 2013-07-25 (×3): qty 3

## 2013-07-25 MED ORDER — ACETAMINOPHEN 325 MG PO TABS: 325.0000 mg | ORAL_TABLET | Freq: Four times a day (QID) | ORAL | Status: DC | PRN

## 2013-07-25 MED ORDER — LORATADINE 10 MG PO TABS
10.0000 mg | ORAL_TABLET | Freq: Every day | ORAL | Status: DC | PRN
  Filled 2013-07-25: qty 1

## 2013-07-25 MED ORDER — DIPHENHYDRAMINE HCL 25 MG PO CAPS: 25.0000 mg | ORAL_CAPSULE | Freq: Every evening | ORAL | Status: DC | PRN

## 2013-07-25 MED ORDER — LEVOFLOXACIN IN D5W 750 MG/150ML IV SOLN
750.0000 mg | Freq: Once | INTRAVENOUS | Status: AC
  Administered 2013-07-25: 750 mg via INTRAVENOUS
  Filled 2013-07-25: qty 150

## 2013-07-25 MED ORDER — ENOXAPARIN SODIUM 40 MG/0.4ML ~~LOC~~ SOLN
40.0000 mg | SUBCUTANEOUS | Status: DC
  Administered 2013-07-25 – 2013-07-27 (×3): 40 mg via SUBCUTANEOUS
  Filled 2013-07-25 (×4): qty 0.4

## 2013-07-25 MED ORDER — SODIUM CHLORIDE 0.9 % IJ SOLN: 3.0000 mL | INTRAMUSCULAR | Status: DC | PRN

## 2013-07-25 MED ORDER — IPRATROPIUM BROMIDE 0.02 % IN SOLN
0.5000 mg | Freq: Once | RESPIRATORY_TRACT | Status: AC
  Administered 2013-07-25: 0.5 mg via RESPIRATORY_TRACT
  Filled 2013-07-25: qty 2.5

## 2013-07-25 MED ORDER — DEXTROSE 5 % IV SOLN: 1.0000 g | Freq: Once | INTRAVENOUS | Status: DC

## 2013-07-25 MED ORDER — IPRATROPIUM BROMIDE 0.02 % IN SOLN
0.5000 mg | Freq: Four times a day (QID) | RESPIRATORY_TRACT | Status: DC
  Administered 2013-07-25: 0.5 mg via RESPIRATORY_TRACT
  Filled 2013-07-25: qty 2.5

## 2013-07-25 MED ORDER — DILTIAZEM HCL 60 MG PO TABS
120.0000 mg | ORAL_TABLET | Freq: Two times a day (BID) | ORAL | Status: DC
  Administered 2013-07-25 – 2013-07-28 (×6): 120 mg via ORAL
  Filled 2013-07-25 (×7): qty 2

## 2013-07-25 MED ORDER — ALBUTEROL SULFATE (2.5 MG/3ML) 0.083% IN NEBU
5.0000 mg | INHALATION_SOLUTION | Freq: Once | RESPIRATORY_TRACT | Status: AC
  Administered 2013-07-25: 5 mg via RESPIRATORY_TRACT
  Filled 2013-07-25: qty 6

## 2013-07-25 MED ORDER — SODIUM CHLORIDE 0.9 % IJ SOLN
3.0000 mL | Freq: Two times a day (BID) | INTRAMUSCULAR | Status: DC
  Administered 2013-07-25 – 2013-07-28 (×7): 3 mL via INTRAVENOUS

## 2013-07-25 MED ORDER — SODIUM CHLORIDE 0.9 % IV SOLN: 250.0000 mL | INTRAVENOUS | Status: DC | PRN

## 2013-07-25 MED ORDER — LEVOFLOXACIN IN D5W 750 MG/150ML IV SOLN
750.0000 mg | INTRAVENOUS | Status: DC
  Administered 2013-07-26 – 2013-07-27 (×2): 750 mg via INTRAVENOUS
  Filled 2013-07-25 (×3): qty 150

## 2013-07-25 MED ORDER — ALBUTEROL SULFATE (2.5 MG/3ML) 0.083% IN NEBU
2.5000 mg | INHALATION_SOLUTION | RESPIRATORY_TRACT | Status: DC | PRN
  Administered 2013-07-26 (×3): 2.5 mg via RESPIRATORY_TRACT
  Filled 2013-07-25 (×3): qty 3

## 2013-07-25 MED ORDER — ONDANSETRON HCL 4 MG/2ML IJ SOLN: 4.0000 mg | Freq: Four times a day (QID) | INTRAMUSCULAR | Status: DC | PRN

## 2013-07-25 MED ORDER — ALBUTEROL SULFATE (2.5 MG/3ML) 0.083% IN NEBU
2.5000 mg | INHALATION_SOLUTION | Freq: Four times a day (QID) | RESPIRATORY_TRACT | Status: DC
  Administered 2013-07-25: 2.5 mg via RESPIRATORY_TRACT
  Filled 2013-07-25: qty 3

## 2013-07-25 MED ORDER — VANCOMYCIN HCL 500 MG IV SOLR
500.0000 mg | Freq: Two times a day (BID) | INTRAVENOUS | Status: DC
  Filled 2013-07-25: qty 500

## 2013-07-25 MED ORDER — ONDANSETRON HCL 4 MG PO TABS: 4.0000 mg | ORAL_TABLET | Freq: Four times a day (QID) | ORAL | Status: DC | PRN

## 2013-07-25 MED ORDER — VANCOMYCIN HCL IN DEXTROSE 1-5 GM/200ML-% IV SOLN: 1000.0000 mg | Freq: Once | INTRAVENOUS | Status: DC

## 2013-07-25 MED ORDER — DIPHENHYDRAMINE HCL (SLEEP) 25 MG PO TABS: 25.0000 mg | ORAL_TABLET | Freq: Every evening | ORAL | Status: DC | PRN

## 2013-07-25 NOTE — Progress Notes (Signed)
[name] [id number] Code Status: FULL Admission Data: 07/25/2013 1:18 PM Attending Provider:  Darrick Meigs WUG:QBVQ Plotnikov, MD Consults/ Treatment Team:  [facility]- [facility]  [name] is a 73 y.o. female patient admitted from ED awake, alert - oriented  X 3 - no acute distress noted.  VSS - Blood pressure 148/77, pulse 82, temperature 98.2 F (36.8 C), temperature source Oral, resp. rate 20, height 5\' 2"  (1.575 m), weight 50.032 kg (110 lb 4.8 oz), SpO2 94.00%.  no c/o shortness of breath, no c/o chest pain. Cardiac tele # 16, in place, cardiac monitor yields:normal sinus rhythm. O2:   2L per minute  IV Fluids:  IV in place, occlusive dsg intact without redness, IV cath antecubital left, condition patent and no redness none.  Allergies:   Allergies  Allergen Reactions  . Penicillins Swelling    Throat swelling  . Shellfish Allergy Anaphylaxis  . Sulfonamide Derivatives Swelling    Throat swelling  . Aspirin Other (See Comments)    States stomach bubbles, becomes gaseous and irritated  . Bee Venom   . Clindamycin     REACTION: Neck, tongue swelling, SOB \\T \ rash  . Fluticasone-Salmeterol     REACTION: hoarseness  . Fruit & Vegetable Daily [Nutritional Supplements]     Tongue swelling, vomiting  . Montelukast Sodium     REACTION: hallucination     Past Medical History  Diagnosis Date  . Anxiety   . Asthma   . HTN (hypertension)   . Allergic rhinitis    Medications Prior to Admission  Medication Sig Dispense Refill  . acetaminophen (TYLENOL) 325 MG tablet Take 325 mg by mouth every 6 (six) hours as needed. For pain      . albuterol (PROAIR HFA) 108 (90 BASE) MCG/ACT inhaler Inhale 2 puffs into the lungs every 4 (four) hours as needed for wheezing or shortness of breath. For shortness of breath  1 Inhaler  prn  . Ascorbic Acid (VITAMIN C PO) Take 1 tablet by mouth daily.       . B Complex Vitamins (VITAMIN B COMPLEX PO) Take 1 tablet by mouth daily as needed (takes  when she remembers to take it).       . budesonide-formoterol (SYMBICORT) 160-4.5 MCG/ACT inhaler Inhale 1 puff into the lungs 2 (two) times daily.      Marland Kitchen Dextromethorphan-Guaifenesin (MUCINEX DM PO) Take 1 tablet by mouth daily.      Marland Kitchen diltiazem (CARDIZEM) 120 MG tablet Take 120 mg by mouth 2 (two) times daily.      . diphenhydrAMINE (SOMINEX) 25 MG tablet Take 25 mg by mouth at bedtime as needed for itching, allergies or sleep.      Marland Kitchen loratadine (CLARITIN) 10 MG tablet Take 10 mg by mouth daily as needed for allergies.       . Multiple Vitamin (MULTIVITAMIN) tablet Take 1 tablet by mouth daily.         History:  obtained from the patient. Tobacco/alcohol: denied none  Orientation to room, and floor completed with information packet given to patient/family.  Patient declined safety video at this time.  Admission INP armband ID verified with patient/family, and in place.   SR up x 2, fall assessment complete, with patient and family able to verbalize understanding of risk associated with falls, and verbalized understanding to call nsg before up out of bed.  Call light within reach, patient able to voice, and demonstrate understanding.  Skin, clean-dry- intact without evidence of bruising,  or skin tears.   No evidence of skin break down noted on exam.     Will cont to eval and treat per MD orders.  Delman Cheadle, RN 07/25/2013 1:18 PM

## 2013-07-25 NOTE — Progress Notes (Signed)
ANTIBIOTIC CONSULT NOTE - INITIAL  Pharmacy Consult for Vancomycin and Levaquin Indication: pneumonia  Allergies  Allergen Reactions  . Penicillins Swelling    Throat swelling  . Shellfish Allergy Anaphylaxis  . Sulfonamide Derivatives Swelling    Throat swelling  . Aspirin Other (See Comments)    States stomach bubbles, becomes gaseous and irritated  . Bee Venom   . Clindamycin     REACTION: Neck, tongue swelling, SOB \\T \ rash  . Fluticasone-Salmeterol     REACTION: hoarseness  . Fruit & Vegetable Daily [Nutritional Supplements]     Tongue swelling, vomiting  . Montelukast Sodium     REACTION: hallucination    Patient Measurements:   Wt Readings from Last 3 Encounters:  04/11/13 123 lb (55.792 kg)  04/10/13 129 lb (58.514 kg)  09/19/12 124 lb 12.8 oz (56.609 kg)     Vital Signs: Temp: 97.9 F (36.6 C) (03/28 0758) Temp src: Oral (03/28 0758) BP: 155/86 mmHg (03/28 0758) Pulse Rate: 90 (03/28 0758) Intake/Output from previous day:   Intake/Output from this shift:    Labs:  Recent Labs  07/25/13 0815  WBC 7.2  HGB 15.8*  PLT 338  CREATININE 0.73   The CrCl is unknown because both a height and weight (above a minimum accepted value) are required for this calculation. No results found for this basename: VANCOTROUGH, VANCOPEAK, VANCORANDOM, GENTTROUGH, GENTPEAK, GENTRANDOM, TOBRATROUGH, TOBRAPEAK, TOBRARND, AMIKACINPEAK, AMIKACINTROU, AMIKACIN,  in the last 72 hours   Microbiology: No results found for this or any previous visit (from the past 720 hour(s)).  Medical History: Past Medical History  Diagnosis Date  . Anxiety   . Asthma   . HTN (hypertension)   . Allergic rhinitis     Medications:  Anti-infectives   Start     Dose/Rate Route Frequency Ordered Stop   07/25/13 1000  levofloxacin (LEVAQUIN) IVPB 750 mg     750 mg 100 mL/hr over 90 Minutes Intravenous  Once 07/25/13 0947     07/25/13 1000  vancomycin (VANCOCIN) IVPB 1000 mg/200 mL  premix     1,000 mg 200 mL/hr over 60 Minutes Intravenous  Once 07/25/13 0947     07/25/13 0945  cefTRIAXone (ROCEPHIN) 1 g in dextrose 5 % 50 mL IVPB  Status:  Discontinued     1 g 100 mL/hr over 30 Minutes Intravenous  Once 07/25/13 0943 07/25/13 0947   07/25/13 0945  azithromycin (ZITHROMAX) 500 mg in dextrose 5 % 250 mL IVPB  Status:  Discontinued     500 mg 250 mL/hr over 60 Minutes Intravenous  Once 07/25/13 9528 07/25/13 0947     Assessment: 73 year old female admitted with pneumonia to begin empiric antibiotic therapy with Vancomycin and Levaquin.  She is to receive Vancomycin 1gm x 1 and Levaquin 750mg  IV x 1 in the ED.  Goal of Therapy:  Vancomycin trough level 15-20 mcg/ml  Plan:  Vancomycin 500mg  IV q12h Levaquin 750mg  IV q24h Monitor renal function  Follow available micro data  Legrand Como, Pharm.D., BCPS, AAHIVP Clinical Pharmacist Phone: [phone] or [phone] 07/25/2013, 10:00 AM

## 2013-07-25 NOTE — ED Notes (Signed)
Admitting physician at the bedside.

## 2013-07-25 NOTE — ED Notes (Signed)
Pt reports sob that has gotten worse over the past week. Hx of asthma and wears home o2 as needed. Reports productive cough with brown sputum. spo 2 91% at triage on room air.

## 2013-07-25 NOTE — ED Provider Notes (Signed)
  This was a shared visit with a mid-level provided (NP or PA).  Throughout the patient's course I was available for consultation/collaboration.  I saw the ECG (if appropriate), relevant labs and studies - I agree with the interpretation.  On my exam the patient was uncomfortable appearing, though she denied any chest pain at all. Patient's evaluation demonstrates concern for pneumonia.  Psychiatric, agree with the interpretation. Patient was admitted for further evaluation and management.      Carmin Muskrat, MD 07/25/13 1534

## 2013-07-25 NOTE — Progress Notes (Signed)
Utilization review completed.  P.J. Braylei Totino,RN,BSN Case Manager 

## 2013-07-25 NOTE — ED Notes (Signed)
Pt attempting to use bedside commode.

## 2013-07-25 NOTE — Progress Notes (Signed)
Report received from ED RN for admission to [phone]

## 2013-07-25 NOTE — ED Notes (Signed)
Pt states SOB for several days. Pt also states brown/yellow colored sputum. Rhonchi heard all lung fields. Respirations unlabored. Denies pain at the time. Pt is alert and oriented x4.

## 2013-07-25 NOTE — ED Provider Notes (Signed)
CSN: [id number]     Arrival date & time 07/25/13  8416 History   First MD Initiated Contact with Patient 07/25/13 [phone]     Chief Complaint  Patient presents with  . Shortness of Breath     (Consider location/radiation/quality/duration/timing/severity/associated sxs/prior Treatment) HPI Comments: Patient is a 73 year old female with history of anxiety, asthma, hypertension, and allergic rhinitis who presents today with 1 week of shortness of breath. She reports that yesterday her shortness of breath worsened and she developed a productive cough. She is bringing up brown sputum. She used her albuterol with little relief this morning. She has home oxygen which she rarely uses, however this past week she has had to use it more and more. Two days ago she had abdominal pain after eating onion and cabbage. The pain improved and she has not had abdominal pain since that time. She denies fevers, chills, nausea.   The history is provided by the patient. No language interpreter was used.    Past Medical History  Diagnosis Date  . Anxiety   . Asthma   . HTN (hypertension)   . Allergic rhinitis    Past Surgical History  Procedure Laterality Date  . Tubal ligation     Family History  Problem Relation Age of Onset  . Diabetes Father   . Asthma Father   . Allergies Father   . Mental illness Mother     alzheimer's  . Allergies Brother   . Allergies Brother   . Heart disease Maternal Aunt    History  Substance Use Topics  . Smoking status: Never Smoker   . Smokeless tobacco: Never Used     Comment: father smoked, worked w/smokers  . Alcohol Use: No   OB History   Grav Para Term Preterm Abortions TAB SAB Ect Mult Living                 Review of Systems  Constitutional: Negative for fever and chills.  Respiratory: Positive for cough and shortness of breath.   Cardiovascular: Negative for chest pain.  Gastrointestinal: Positive for abdominal pain. Negative for nausea and vomiting.   Skin: Negative for rash.  All other systems reviewed and are negative.      Allergies  Penicillins; Shellfish allergy; Sulfonamide derivatives; Aspirin; Clindamycin; Fluticasone-salmeterol; Fruit & vegetable daily; and Montelukast sodium  Home Medications   Current Outpatient Rx  Name  Route  Sig  Dispense  Refill  . acetaminophen (TYLENOL) 325 MG tablet   Oral   Take 325 mg by mouth every 6 (six) hours as needed. For pain         . albuterol (PROAIR HFA) 108 (90 BASE) MCG/ACT inhaler   Inhalation   Inhale 2 puffs into the lungs every 4 (four) hours as needed for wheezing or shortness of breath. For shortness of breath   1 Inhaler   prn   . Ascorbic Acid (VITAMIN C PO)   Oral   Take 1 tablet by mouth daily.          . Azelastine-Fluticasone 137-50 MCG/ACT SUSP   Nasal   Place 1 Act into the nose daily as needed (allergy congestion).          . B Complex Vitamins (VITAMIN B COMPLEX PO)   Oral   Take 1 tablet by mouth daily as needed (takes when she remembers to take it).          . budesonide-formoterol (SYMBICORT) 160-4.5 MCG/ACT inhaler  Inhalation   Inhale 1 puff into the lungs 2 (two) times daily.         Marland Kitchen diltiazem (CARDIZEM) 120 MG tablet   Oral   Take 120 mg by mouth 2 (two) times daily.         . Elastic Bandages & Supports (O-2 SUSPENSORY) MISC   Does not apply   by Does not apply route as needed.         . loratadine (CLARITIN) 10 MG tablet   Oral   Take 10 mg by mouth daily as needed for allergies.          . Multiple Vitamin (MULTIVITAMIN) tablet   Oral   Take 1 tablet by mouth daily.            BP 155/86  Pulse 90  Temp(Src) 97.9 F (36.6 C) (Oral)  Resp 20  SpO2 91% Physical Exam  Nursing note and vitals reviewed. Constitutional: She is oriented to person, place, and time. She appears well-developed and well-nourished. No distress.  frail  HENT:  Head: Normocephalic and atraumatic.  Right Ear: External ear normal.   Left Ear: External ear normal.  Nose: Nose normal.  Mouth/Throat: Oropharynx is clear and moist.  Eyes: Conjunctivae are normal.  Neck: Normal range of motion.  Cardiovascular: Normal rate, regular rhythm and normal heart sounds.   Pulmonary/Chest: No stridor. Tachypnea noted. No respiratory distress. She has wheezes. She has rhonchi. She has rales in the right middle field and the right lower field.  Abdominal: Soft. She exhibits no distension.  Musculoskeletal: Normal range of motion.  Neurological: She is alert and oriented to person, place, and time. She has normal strength.  Skin: Skin is warm and dry. She is not diaphoretic. No erythema.  Psychiatric: She has a normal mood and affect. Her behavior is normal.    ED Course  Procedures (including critical care time) Labs Review Labs Reviewed  CBC WITH DIFFERENTIAL - Abnormal; Notable for the following:    RBC 5.14 (*)    Hemoglobin 15.8 (*)    HCT 46.4 (*)    Neutrophils Relative % 42 (*)    Eosinophils Relative 9 (*)    All other components within normal limits  COMPREHENSIVE METABOLIC PANEL - Abnormal; Notable for the following:    Chloride 95 (*)    Total Protein 8.4 (*)    GFR calc non Af Amer 83 (*)    All other components within normal limits  CULTURE, BLOOD (ROUTINE X 2)  CULTURE, BLOOD (ROUTINE X 2)  URINE CULTURE  URINALYSIS, ROUTINE W REFLEX MICROSCOPIC  I-STAT CG4 LACTIC ACID, ED  Randolm Idol, ED   Imaging Review Dg Chest 2 View  07/25/2013   CLINICAL DATA:  Shortness of breath and productive cough.  EXAM: CHEST  2 VIEW  COMPARISON:  DG CHEST 2 VIEW dated 08/18/2012; CT ANGIO CHEST W/CM &/OR WO/CM dated 06/26/2012  FINDINGS: Two views of the chest demonstrate new densities in the medial right lower chest. The right heart border is obscured on the frontal view. Left lung is clear. Heart size is within normal limits. No evidence for pleural effusions. Prominent densities in the right middle lobe region on the  lateral view.  IMPRESSION: New densities in the region of the right middle lobe. Findings are most compatible with pneumonia. Recommend follow-up chest radiograph to ensure resolution.   Electronically Signed   By: Markus Daft M.D.   On: 07/25/2013 09:32     EKG  Interpretation   Date/Time:  Saturday July 25 2013 07:54:57 EDT Ventricular Rate:  90 PR Interval:  140 QRS Duration: 96 QT Interval:  450 QTC Calculation: 550 R Axis:   -61 Text Interpretation:  Normal sinus rhythm Right atrial enlargement  Pulmonary disease pattern Incomplete right bundle branch block Left  anterior fascicular block ST \\T \ T wave abnormality, consider  anterolateral ischemia Prolonged QT Abnormal ECG Sinus rhythm T wave  inversion Right bundle branch block Abnormal ekg ECG from earlier today  had artefact - this is repeat- otherwise similar Confirmed by Carmin Muskrat  MD [phone]) on 07/25/2013 9:38:32 AM      MDM   Final diagnoses:  CAP (community acquired pneumonia)    Patient presents to ED with CAP. Increased oxygen demand. Started on vancomycin and Levaquin in ED due to pcn allergy. Vital signs stable. Will admit to medicine for further management. Discussed case with Dr. Vanita Panda who agrees with plan. Patient / Family / Caregiver informed of clinical course, understand medical decision-making process, and agree with plan.    Elwyn Lade, PA-C 07/25/13 1047

## 2013-07-25 NOTE — H&P (Signed)
PCP:   Walker Kehr, MD   Chief Complaint:  Worsening shortness of breath  HPI: 73 year old female who   has a past medical history of Anxiety; Asthma; HTN (hypertension); and Allergic rhinitis. Today presented to the ED with chief complaint of worsening shortness of breath. Patient is on home oxygen for asthma, she also has been coughing up brown-colored phlegm. Denies blood in the phlegm. She also denies nausea vomiting or diarrhea. Denies any chest pain. In the ED, chest x-ray was done which showed right middle lobe density consistent with pneumonia. Patient received vancomycin and Levaquin in the ED. Allergies:   Allergies  Allergen Reactions  . Penicillins Swelling    Throat swelling  . Shellfish Allergy Anaphylaxis  . Sulfonamide Derivatives Swelling    Throat swelling  . Aspirin Other (See Comments)    States stomach bubbles, becomes gaseous and irritated  . Bee Venom   . Clindamycin     REACTION: Neck, tongue swelling, SOB \T\ rash  . Fluticasone-Salmeterol     REACTION: hoarseness  . Fruit & Vegetable Daily [Nutritional Supplements]     Tongue swelling, vomiting  . Montelukast Sodium     REACTION: hallucination      Past Medical History  Diagnosis Date  . Anxiety   . Asthma   . HTN (hypertension)   . Allergic rhinitis     Past Surgical History  Procedure Laterality Date  . Tubal ligation      Prior to Admission medications   Medication Sig Start Date End Date Taking? Authorizing Provider  acetaminophen (TYLENOL) 325 MG tablet Take 325 mg by mouth every 6 (six) hours as needed. For pain   Yes Historical Provider, MD  albuterol (PROAIR HFA) 108 (90 BASE) MCG/ACT inhaler Inhale 2 puffs into the lungs every 4 (four) hours as needed for wheezing or shortness of breath. For shortness of breath 06/24/12  Yes Deneise Lever, MD  Ascorbic Acid (VITAMIN C PO) Take 1 tablet by mouth daily.    Yes Historical Provider, MD  B Complex Vitamins (VITAMIN B COMPLEX PO)  Take 1 tablet by mouth daily as needed (takes when she remembers to take it).    Yes Historical Provider, MD  budesonide-formoterol (SYMBICORT) 160-4.5 MCG/ACT inhaler Inhale 1 puff into the lungs 2 (two) times daily. 06/07/11  Yes Cassandria Anger, MD  Dextromethorphan-Guaifenesin (MUCINEX DM PO) Take 1 tablet by mouth daily.   Yes Historical Provider, MD  diltiazem (CARDIZEM) 120 MG tablet Take 120 mg by mouth 2 (two) times daily.   Yes Historical Provider, MD  diphenhydrAMINE (SOMINEX) 25 MG tablet Take 25 mg by mouth at bedtime as needed for itching, allergies or sleep.   Yes Historical Provider, MD  loratadine (CLARITIN) 10 MG tablet Take 10 mg by mouth daily as needed for allergies.  08/16/10  Yes Cassandria Anger, MD  Multiple Vitamin (MULTIVITAMIN) tablet Take 1 tablet by mouth daily.     Yes Historical Provider, MD    Social History:  reports that she has never smoked. She has never used smokeless tobacco. She reports that she does not drink alcohol or use illicit drugs.  Family History  Problem Relation Age of Onset  . Diabetes Father   . Asthma Father   . Allergies Father   . Mental illness Mother     alzheimer's  . Allergies Brother   . Allergies Brother   . Heart disease Maternal Aunt      All the positives are listed in  BOLD  Review of Systems:  HEENT: Headache, blurred vision, runny nose, sore throat Neck: Hypothyroidism, hyperthyroidism,,lymphadenopathy Chest : Shortness of breath, history of COPD, Asthma Heart : Chest pain, history of coronary arterey disease GI:  Nausea, vomiting, diarrhea, constipation, GERD GU: Dysuria, urgency, frequency of urination, hematuria Neuro: Stroke, seizures, syncope Psych: Depression, anxiety, hallucinations   Physical Exam: Blood pressure 141/79, pulse 90, temperature 97.9 F (36.6 C), temperature source Oral, resp. rate 20, SpO2 98.00%. Constitutional:   Patient is a well-developed and well-nourished *female in no acute  distress and cooperative with exam. Head: Normocephalic and atraumatic Mouth: Mucus membranes moist Eyes: PERRL, EOMI, conjunctivae normal Neck: Supple, No Thyromegaly Cardiovascular: RRR, S1 normal, S2 normal Pulmonary/Chest: Scattered rhonchi bilaterally Abdominal: Soft. Non-tender, non-distended, bowel sounds are normal, no masses, organomegaly, or guarding present.  Neurological: A&O x3, Strenght is normal and symmetric bilaterally, cranial nerve II-XII are grossly intact, no focal motor deficit, sensory intact to light touch bilaterally.  Extremities : No Cyanosis, Clubbing or Edema   Labs on Admission:  Results for orders placed during the hospital encounter of 07/25/13 (from the past 48 hour(s))  CBC WITH DIFFERENTIAL     Status: Abnormal   Collection Time    07/25/13  8:15 AM      Result Value Ref Range   WBC 7.2  4.0 - 10.5 K/uL   RBC 5.14 (*) 3.87 - 5.11 MIL/uL   Hemoglobin 15.8 (*) 12.0 - 15.0 g/dL   HCT 46.4 (*) 36.0 - 46.0 %   MCV 90.3  78.0 - 100.0 fL   MCH 30.7  26.0 - 34.0 pg   MCHC 34.1  30.0 - 36.0 g/dL   RDW 13.7  11.5 - 15.5 %   Platelets 338  150 - 400 K/uL   Neutrophils Relative % 42 (*) 43 - 77 %   Neutro Abs 3.1  1.7 - 7.7 K/uL   Lymphocytes Relative 39  12 - 46 %   Lymphs Abs 2.8  0.7 - 4.0 K/uL   Monocytes Relative 9  3 - 12 %   Monocytes Absolute 0.6  0.1 - 1.0 K/uL   Eosinophils Relative 9 (*) 0 - 5 %   Eosinophils Absolute 0.6  0.0 - 0.7 K/uL   Basophils Relative 1  0 - 1 %   Basophils Absolute 0.1  0.0 - 0.1 K/uL  COMPREHENSIVE METABOLIC PANEL     Status: Abnormal   Collection Time    07/25/13  8:15 AM      Result Value Ref Range   Sodium 142  137 - 147 mEq/L   Potassium 3.9  3.7 - 5.3 mEq/L   Chloride 95 (*) 96 - 112 mEq/L   CO2 31  19 - 32 mEq/L   Glucose, Bld 96  70 - 99 mg/dL   BUN 11  6 - 23 mg/dL   Creatinine, Ser 0.73  0.50 - 1.10 mg/dL   Calcium 10.2  8.4 - 10.5 mg/dL   Total Protein 8.4 (*) 6.0 - 8.3 g/dL   Albumin 4.2  3.5 -  5.2 g/dL   AST 24  0 - 37 U/L   ALT 14  0 - 35 U/L   Alkaline Phosphatase 75  39 - 117 U/L   Total Bilirubin 0.6  0.3 - 1.2 mg/dL   GFR calc non Af Amer 83 (*) >90 mL/min   GFR calc Af Amer >90  >90 mL/min   Comment: (NOTE)     The eGFR has  been calculated using the CKD EPI equation.     This calculation has not been validated in all clinical situations.     eGFR's persistently <90 mL/min signify possible Chronic Kidney     Disease.  Randolm Idol, ED     Status: None   Collection Time    07/25/13  8:40 AM      Result Value Ref Range   Troponin i, poc 0.01  0.00 - 0.08 ng/mL   Comment 3            Comment: Due to the release kinetics of cTnI,     a negative result within the first hours     of the onset of symptoms does not rule out     myocardial infarction with certainty.     If myocardial infarction is still suspected,     repeat the test at appropriate intervals.  I-STAT CG4 LACTIC ACID, ED     Status: None   Collection Time    07/25/13  8:42 AM      Result Value Ref Range   Lactic Acid, Venous 1.42  0.5 - 2.2 mmol/L    Radiological Exams on Admission: Dg Chest 2 View  07/25/2013   CLINICAL DATA:  Shortness of breath and productive cough.  EXAM: CHEST  2 VIEW  COMPARISON:  DG CHEST 2 VIEW dated 08/18/2012; CT ANGIO CHEST W/CM &/OR WO/CM dated 06/26/2012  FINDINGS: Two views of the chest demonstrate new densities in the medial right lower chest. The right heart border is obscured on the frontal view. Left lung is clear. Heart size is within normal limits. No evidence for pleural effusions. Prominent densities in the right middle lobe region on the lateral view.  IMPRESSION: New densities in the region of the right middle lobe. Findings are most compatible with pneumonia. Recommend follow-up chest radiograph to ensure resolution.   Electronically Signed   By: Markus Daft M.D.   On: 07/25/2013 09:32    Assessment/Plan Active Problems:    Pneumonia Asthma Hypertension  Pneumonia, community acquired Continue patient on IV Levaquin per pharmacy consult We'll obtain urine remote and recent, Legionella urine antigen.  Follow blood culture results  Asthma  Continue DuoNeb nebulizers every 6 hours, albuterol present every 2 hours when necessary.   Hypertension  Continue Cardizem 120 mg by mouth twice a day   Code status:Patient is full code  Family discussion:Discussed with patient's husband at bedside   Time Spent on Admission: 60 minutes  Zerah Hilyer S [facility] Pager: [phone] 07/25/2013, 10:58 AM  If 7PM-7AM, please contact night-coverage  [email or web address]  Password [email or web address]

## 2013-07-25 NOTE — ED Notes (Signed)
Lactic acid results given to MD by security

## 2013-07-26 DIAGNOSIS — J45901 Unspecified asthma with (acute) exacerbation: Secondary | ICD-10-CM

## 2013-07-26 DIAGNOSIS — F411 Generalized anxiety disorder: Secondary | ICD-10-CM

## 2013-07-26 DIAGNOSIS — J9620 Acute and chronic respiratory failure, unspecified whether with hypoxia or hypercapnia: Secondary | ICD-10-CM

## 2013-07-26 LAB — COMPREHENSIVE METABOLIC PANEL
ALK PHOS: 71 U/L (ref 39–117)
ALT: 12 U/L (ref 0–35)
AST: 20 U/L (ref 0–37)
Albumin: 4 g/dL (ref 3.5–5.2)
BILIRUBIN TOTAL: 0.5 mg/dL (ref 0.3–1.2)
BUN: 10 mg/dL (ref 6–23)
CHLORIDE: 96 meq/L (ref 96–112)
CO2: 33 meq/L — AB (ref 19–32)
Calcium: 10.2 mg/dL (ref 8.4–10.5)
Creatinine, Ser: 0.81 mg/dL (ref 0.50–1.10)
GFR calc non Af Amer: 71 mL/min — ABNORMAL LOW (ref >90)
GFR, EST AFRICAN AMERICAN: 82 mL/min — AB (ref >90)
GLUCOSE: 116 mg/dL — AB (ref 70–99)
POTASSIUM: 4.5 meq/L (ref 3.7–5.3)
Sodium: 142 mEq/L (ref 137–147)
Total Protein: 8.1 g/dL (ref 6.0–8.3)

## 2013-07-26 LAB — LEGIONELLA ANTIGEN, URINE: Legionella Antigen, Urine: NEGATIVE

## 2013-07-26 LAB — URINE CULTURE: Colony Count: >=100000

## 2013-07-26 LAB — CBC
HEMATOCRIT: 45.7 % (ref 36.0–46.0)
Hemoglobin: 15.7 g/dL — ABNORMAL HIGH (ref 12.0–15.0)
MCH: 31.1 pg (ref 26.0–34.0)
MCHC: 34.4 g/dL (ref 30.0–36.0)
MCV: 90.5 fL (ref 78.0–100.0)
Platelets: 337 10*3/uL (ref 150–400)
RBC: 5.05 MIL/uL (ref 3.87–5.11)
RDW: 13.9 % (ref 11.5–15.5)
WBC: 6.3 10*3/uL (ref 4.0–10.5)

## 2013-07-26 LAB — PRO B NATRIURETIC PEPTIDE: PRO B NATRI PEPTIDE: 204.4 pg/mL — AB (ref 0–125)

## 2013-07-26 MED ORDER — METHYLPREDNISOLONE SODIUM SUCC 125 MG IJ SOLR
60.0000 mg | Freq: Three times a day (TID) | INTRAMUSCULAR | Status: DC
  Administered 2013-07-26 – 2013-07-27 (×5): 60 mg via INTRAVENOUS
  Filled 2013-07-26 (×3): qty 0.96
  Filled 2013-07-26: qty 2
  Filled 2013-07-26 (×5): qty 0.96
  Filled 2013-07-26: qty 2

## 2013-07-26 MED ORDER — IPRATROPIUM BROMIDE 0.02 % IN SOLN
0.5000 mg | Freq: Four times a day (QID) | RESPIRATORY_TRACT | Status: DC
  Filled 2013-07-26: qty 2.5

## 2013-07-26 MED ORDER — LEVALBUTEROL HCL 0.63 MG/3ML IN NEBU
0.6300 mg | INHALATION_SOLUTION | Freq: Four times a day (QID) | RESPIRATORY_TRACT | Status: DC
  Administered 2013-07-26: 0.63 mg via RESPIRATORY_TRACT
  Filled 2013-07-26 (×2): qty 3

## 2013-07-26 MED ORDER — ALPRAZOLAM 0.25 MG PO TABS: 0.2500 mg | ORAL_TABLET | Freq: Three times a day (TID) | ORAL | Status: DC | PRN

## 2013-07-26 MED ORDER — ALBUTEROL SULFATE (2.5 MG/3ML) 0.083% IN NEBU
2.5000 mg | INHALATION_SOLUTION | Freq: Three times a day (TID) | RESPIRATORY_TRACT | Status: DC
  Administered 2013-07-27 – 2013-07-28 (×3): 2.5 mg via RESPIRATORY_TRACT
  Filled 2013-07-26 (×4): qty 3

## 2013-07-26 MED ORDER — PANTOPRAZOLE SODIUM 40 MG PO TBEC
40.0000 mg | DELAYED_RELEASE_TABLET | Freq: Every day | ORAL | Status: DC
  Administered 2013-07-26 – 2013-07-27 (×2): 40 mg via ORAL
  Filled 2013-07-26 (×2): qty 1

## 2013-07-26 MED ORDER — LORATADINE 10 MG PO TABS
10.0000 mg | ORAL_TABLET | Freq: Every day | ORAL | Status: DC
  Administered 2013-07-26 – 2013-07-28 (×3): 10 mg via ORAL
  Filled 2013-07-26 (×3): qty 1

## 2013-07-26 MED ORDER — GUAIFENESIN ER 600 MG PO TB12
600.0000 mg | ORAL_TABLET | Freq: Two times a day (BID) | ORAL | Status: DC
  Administered 2013-07-26 – 2013-07-28 (×5): 600 mg via ORAL
  Filled 2013-07-26 (×6): qty 1

## 2013-07-26 MED ORDER — ALBUTEROL SULFATE (2.5 MG/3ML) 0.083% IN NEBU: 2.5000 mg | INHALATION_SOLUTION | Freq: Four times a day (QID) | RESPIRATORY_TRACT | Status: DC

## 2013-07-26 MED ORDER — ALBUTEROL SULFATE (2.5 MG/3ML) 0.083% IN NEBU: 2.5000 mg | INHALATION_SOLUTION | Freq: Three times a day (TID) | RESPIRATORY_TRACT | Status: DC

## 2013-07-26 NOTE — Progress Notes (Signed)
PATIENT DETAILS Name: [name] Age: 73 y.o. Sex: female Date of Birth: Nov 01, 1940 Admit Date: 07/25/2013 Admitting Physician Oswald Hillock, MD [id number] Plotnikov, MD  Subjective: Essentially unchanged compared to admission  Assessment/Plan: Community Acquired PNA -c/w Levaquin-day 2 -afebrile and with no leukocytosis -urine strep pneumo antigen-neg, legionella antigen pending, Influenza PCR neg  Acute on Chronic Resp Failure -normally on 2 L of O2 at home, currently requiring 3 L -secondary to PNA, Asthma exac-will treat with Abx, Nebs/steroids and follow clinical course -Although short of breath-not using accessory muscles, speaking in full sentences and appears comfortable  Asthma Exacerbation -c/w nebulized bronchodilators, start IV Solumedrol -start Mucinex, incentive spirometry  Hx of HTN -moderate control-c/w Cardizem  Anxiety -prn Xanax  Disposition: Remain inpatient  DVT Prophylaxis: Prophylactic Lovenox   Code Status: Full code   Family Communication Spouse at bedside  Procedures:  None  CONSULTS:  None  Time spent 40 minutes-which includes 50% of the time with face-to-face with patient/ family and coordinating care related to the above assessment and plan.    MEDICATIONS: Scheduled Meds: . diltiazem  120 mg Oral BID  . enoxaparin (LOVENOX) injection  40 mg Subcutaneous Q24H  . guaiFENesin  600 mg Oral BID  . ipratropium  0.5 mg Nebulization Q6H  . levalbuterol  0.63 mg Nebulization Q6H  . levofloxacin (LEVAQUIN) IV  750 mg Intravenous Q24H  . methylPREDNISolone (SOLU-MEDROL) injection  60 mg Intravenous 3 times per day  . sodium chloride  3 mL Intravenous Q12H   Continuous Infusions:  PRN Meds:.sodium chloride, acetaminophen, albuterol, diphenhydrAMINE, loratadine, ondansetron (ZOFRAN) IV, ondansetron, sodium chloride  Antibiotics: Anti-infectives   Start     Dose/Rate Route Frequency Ordered Stop   07/26/13 1100   levofloxacin (LEVAQUIN) IVPB 750 mg     750 mg 100 mL/hr over 90 Minutes Intravenous Every 24 hours 07/25/13 1007     07/25/13 2200  vancomycin (VANCOCIN) 500 mg in sodium chloride 0.9 % 100 mL IVPB  Status:  Discontinued     500 mg 100 mL/hr over 60 Minutes Intravenous Every 12 hours 07/25/13 1007 07/25/13 1057   07/25/13 1000  levofloxacin (LEVAQUIN) IVPB 750 mg     750 mg 100 mL/hr over 90 Minutes Intravenous  Once 07/25/13 0947 07/25/13 1114   07/25/13 1000  vancomycin (VANCOCIN) IVPB 1000 mg/200 mL premix  Status:  Discontinued     1,000 mg 200 mL/hr over 60 Minutes Intravenous  Once 07/25/13 0947 07/25/13 1057   07/25/13 0945  cefTRIAXone (ROCEPHIN) 1 g in dextrose 5 % 50 mL IVPB  Status:  Discontinued     1 g 100 mL/hr over 30 Minutes Intravenous  Once 07/25/13 0943 07/25/13 0947   07/25/13 0945  azithromycin (ZITHROMAX) 500 mg in dextrose 5 % 250 mL IVPB  Status:  Discontinued     500 mg 250 mL/hr over 60 Minutes Intravenous  Once 07/25/13 0943 07/25/13 0947       PHYSICAL EXAM: Vital signs in last 24 hours: Filed Vitals:   07/26/13 0557 07/26/13 0613 07/26/13 0825 07/26/13 0925  BP: 153/70   137/75  Pulse: 77     Temp: 98 F (36.7 C)     TempSrc: Oral     Resp: 20     Height:      Weight:      SpO2: 92% 94% 94%     Weight change:  Filed Weights   07/25/13 1208  Weight: 50.032  kg (110 lb 4.8 oz)   Body mass index is 20.17 kg/(m^2).   Gen Exam: Awake and alert with clear speech.  Not in acute distress. Neck: Supple, No JVD.   Chest: B/L good air entry with coarse rhonchi all over. CVS: S1 S2 Regular, no murmurs.  Abdomen: soft, BS +, non tender, non distended.  Extremities: no edema, lower extremities warm to touch. Neurologic: Non Focal.   Skin: No Rash.   Wounds: N/A.    Intake/Output from previous day:  Intake/Output Summary (Last 24 hours) at 07/26/13 1054 Last data filed at 07/26/13 0935  Gross per 24 hour  Intake    450 ml  Output      0 ml    Net    450 ml     LAB RESULTS: CBC  Recent Labs Lab 07/25/13 0815 07/26/13 0948  WBC 7.2 6.3  HGB 15.8* 15.7*  HCT 46.4* 45.7  PLT 338 337  MCV 90.3 90.5  MCH 30.7 31.1  MCHC 34.1 34.4  RDW 13.7 13.9  LYMPHSABS 2.8  --   MONOABS 0.6  --   EOSABS 0.6  --   BASOSABS 0.1  --     Chemistries   Recent Labs Lab 07/25/13 0815 07/26/13 0948  NA 142 142  K 3.9 4.5  CL 95* 96  CO2 31 33*  GLUCOSE 96 116*  BUN 11 10  CREATININE 0.73 0.81  CALCIUM 10.2 10.2    CBG: No results found for this basename: GLUCAP,  in the last 168 hours  GFR Estimated Creatinine Clearance: 49.6 ml/min (by C-G formula based on Cr of 0.81).  Coagulation profile No results found for this basename: INR, PROTIME,  in the last 168 hours  Cardiac Enzymes No results found for this basename: CK, CKMB, TROPONINI, MYOGLOBIN,  in the last 168 hours  No components found with this basename: POCBNP,  No results found for this basename: DDIMER,  in the last 72 hours No results found for this basename: HGBA1C,  in the last 72 hours No results found for this basename: CHOL, HDL, LDLCALC, TRIG, CHOLHDL, LDLDIRECT,  in the last 72 hours No results found for this basename: TSH, T4TOTAL, FREET3, T3FREE, THYROIDAB,  in the last 72 hours No results found for this basename: VITAMINB12, FOLATE, FERRITIN, TIBC, IRON, RETICCTPCT,  in the last 72 hours No results found for this basename: LIPASE, AMYLASE,  in the last 72 hours  Urine Studies No results found for this basename: UACOL, UAPR, USPG, UPH, UTP, UGL, UKET, UBIL, UHGB, UNIT, UROB, ULEU, UEPI, UWBC, URBC, UBAC, CAST, CRYS, UCOM, BILUA,  in the last 72 hours  MICROBIOLOGY: No results found for this or any previous visit (from the past 240 hour(s)).  RADIOLOGY STUDIES/RESULTS: Dg Chest 2 View  07/25/2013   CLINICAL DATA:  Shortness of breath and productive cough.  EXAM: CHEST  2 VIEW  COMPARISON:  DG CHEST 2 VIEW dated 08/18/2012; CT ANGIO CHEST W/CM &/OR  WO/CM dated 06/26/2012  FINDINGS: Two views of the chest demonstrate new densities in the medial right lower chest. The right heart border is obscured on the frontal view. Left lung is clear. Heart size is within normal limits. No evidence for pleural effusions. Prominent densities in the right middle lobe region on the lateral view.  IMPRESSION: New densities in the region of the right middle lobe. Findings are most compatible with pneumonia. Recommend follow-up chest radiograph to ensure resolution.   Electronically Signed   By: Quita Skye  Anselm Pancoast M.D.   On: 07/25/2013 09:32    Oren Binet, MD  [facility] Pager:[phone]  If 7PM-7AM, please contact night-coverage [email or web address] Password [email or web address] 07/26/2013, 10:54 AM   LOS: 1 day

## 2013-07-26 NOTE — Progress Notes (Signed)
Pt has been refusing current neb tx's.  Requested with Day Shift RT, as well as this RT, that she take Albuterol Nebs scheduled rather than Xopenex and Atrovent.  Pt states she does not like how the two (Xopenex, Atrovent) medications make her feel.  She would like to maintain Albuterol Nebs and add her home medication of Symbicort as her current scheduled therapy.

## 2013-07-27 LAB — BASIC METABOLIC PANEL
BUN: 14 mg/dL (ref 6–23)
CHLORIDE: 99 meq/L (ref 96–112)
CO2: 27 mEq/L (ref 19–32)
CREATININE: 0.84 mg/dL (ref 0.50–1.10)
Calcium: 10 mg/dL (ref 8.4–10.5)
GFR, EST AFRICAN AMERICAN: 79 mL/min — AB (ref >90)
GFR, EST NON AFRICAN AMERICAN: 68 mL/min — AB (ref >90)
Glucose, Bld: 138 mg/dL — ABNORMAL HIGH (ref 70–99)
POTASSIUM: 4.5 meq/L (ref 3.7–5.3)
Sodium: 139 mEq/L (ref 137–147)

## 2013-07-27 LAB — CBC
HCT: 43.2 % (ref 36.0–46.0)
Hemoglobin: 14.4 g/dL (ref 12.0–15.0)
MCH: 30 pg (ref 26.0–34.0)
MCHC: 33.3 g/dL (ref 30.0–36.0)
MCV: 90 fL (ref 78.0–100.0)
PLATELETS: 336 10*3/uL (ref 150–400)
RBC: 4.8 MIL/uL (ref 3.87–5.11)
RDW: 13.8 % (ref 11.5–15.5)
WBC: 5.4 10*3/uL (ref 4.0–10.5)

## 2013-07-27 MED ORDER — METHYLPREDNISOLONE SODIUM SUCC 40 MG IJ SOLR
40.0000 mg | Freq: Two times a day (BID) | INTRAMUSCULAR | Status: DC
  Administered 2013-07-27 – 2013-07-28 (×2): 40 mg via INTRAVENOUS
  Filled 2013-07-27 (×3): qty 1

## 2013-07-27 MED ORDER — WHITE PETROLATUM GEL
Status: AC
  Administered 2013-07-27: 0.2
  Filled 2013-07-27: qty 5

## 2013-07-27 NOTE — Progress Notes (Signed)
Chaplain responded to spiritual care consult. Patient desired information on advance directive. Chaplain provided documents, reviewed materials with patient, and answered all of her questions. Patient said she will read material and decide if she would like to complete AD. Chaplain also provided emotional and spiritual support, listening empathically to her concerns, and praying with patient. Patient was grateful for support. Please page as needed.   Ethelene Browns [phone]

## 2013-07-27 NOTE — Progress Notes (Signed)
PATIENT DETAILS Name: [name] Age: 73 y.o. Sex: female Date of Birth: Mar 24, 1941 Admit Date: 07/25/2013 Admitting Physician Oswald Hillock, MD FUX:[id number] Plotnikov, MD  Subjective: Significantly more improved today.  Assessment/Plan: Community Acquired PNA -c/w Levaquin-day 3 -afebrile and with no leukocytosis -urine strep pneumo antigen-neg, legionella antigen negative, Influenza PCR neg. Blood culture on 07/25/13 also negative.  Acute on Chronic Resp Failure -normally on 2 L of O2 at home, requiring 3 L on admission -secondary to PNA, Asthma exac- much better with  Abx, Nebs/steroids  Asthma Exacerbation -c/w nebulized bronchodilators, IV Solumedrol- will start tapering. - Continue with Mucinex, incentive spirometry - Significantly improved, lungs show only scattered rhonchi today-suspect if clinical improvement continues can be discharged on 3/21  Hx of HTN -moderate control-c/w Cardizem  Anxiety -prn Xanax  Disposition: Remain inpatient-home 3/31 if clinical improvement continues  DVT Prophylaxis: Prophylactic Lovenox   Code Status: Full code   Family Communication Spouse at bedside  Procedures:  None  CONSULTS:  None  MEDICATIONS: Scheduled Meds: . albuterol  2.5 mg Nebulization TID  . diltiazem  120 mg Oral BID  . enoxaparin (LOVENOX) injection  40 mg Subcutaneous Q24H  . guaiFENesin  600 mg Oral BID  . levofloxacin (LEVAQUIN) IV  750 mg Intravenous Q24H  . loratadine  10 mg Oral Daily  . [START ON 07/28/2013] methylPREDNISolone (SOLU-MEDROL) injection  40 mg Intravenous Q12H  . pantoprazole  40 mg Oral Q1200  . sodium chloride  3 mL Intravenous Q12H   Continuous Infusions:  PRN Meds:.sodium chloride, acetaminophen, albuterol, ALPRAZolam, diphenhydrAMINE, ondansetron (ZOFRAN) IV, ondansetron, sodium chloride  Antibiotics: Anti-infectives   Start     Dose/Rate Route Frequency Ordered Stop   07/26/13 1100  levofloxacin (LEVAQUIN) IVPB  750 mg     750 mg 100 mL/hr over 90 Minutes Intravenous Every 24 hours 07/25/13 1007     07/25/13 2200  vancomycin (VANCOCIN) 500 mg in sodium chloride 0.9 % 100 mL IVPB  Status:  Discontinued     500 mg 100 mL/hr over 60 Minutes Intravenous Every 12 hours 07/25/13 1007 07/25/13 1057   07/25/13 1000  levofloxacin (LEVAQUIN) IVPB 750 mg     750 mg 100 mL/hr over 90 Minutes Intravenous  Once 07/25/13 0947 07/25/13 1114   07/25/13 1000  vancomycin (VANCOCIN) IVPB 1000 mg/200 mL premix  Status:  Discontinued     1,000 mg 200 mL/hr over 60 Minutes Intravenous  Once 07/25/13 0947 07/25/13 1057   07/25/13 0945  cefTRIAXone (ROCEPHIN) 1 g in dextrose 5 % 50 mL IVPB  Status:  Discontinued     1 g 100 mL/hr over 30 Minutes Intravenous  Once 07/25/13 0943 07/25/13 0947   07/25/13 0945  azithromycin (ZITHROMAX) 500 mg in dextrose 5 % 250 mL IVPB  Status:  Discontinued     500 mg 250 mL/hr over 60 Minutes Intravenous  Once 07/25/13 0943 07/25/13 0947       PHYSICAL EXAM: Vital signs in last 24 hours: Filed Vitals:   07/26/13 2118 07/26/13 2226 07/27/13 0539 07/27/13 1103  BP: 120/65  111/67   Pulse: 78  70   Temp: 97.7 F (36.5 C)  97.6 F (36.4 C)   TempSrc: Oral  Oral   Resp: 18  18   Height:      Weight:      SpO2: 100% 100% 95% 93%    Weight change:  Filed Weights   07/25/13 1208  Weight:  50.032 kg (110 lb 4.8 oz)   Body mass index is 20.17 kg/(m^2).   Gen Exam: Awake and alert with clear speech.  Not in acute distress. Neck: Supple, No JVD.   Chest: Very good air entry, only scattered rhonchi today CVS: S1 S2 Regular, no murmurs.  Abdomen: soft, BS +, non tender, non distended.  Extremities: no edema, lower extremities warm to touch. Neurologic: Non Focal.   Skin: No Rash.   Wounds: N/A.    Intake/Output from previous day:  Intake/Output Summary (Last 24 hours) at 07/27/13 1350 Last data filed at 07/26/13 1422  Gross per 24 hour  Intake    222 ml  Output      0 ml   Net    222 ml     LAB RESULTS: CBC  Recent Labs Lab 07/25/13 0815 07/26/13 0948 07/27/13 0555  WBC 7.2 6.3 5.4  HGB 15.8* 15.7* 14.4  HCT 46.4* 45.7 43.2  PLT 338 337 336  MCV 90.3 90.5 90.0  MCH 30.7 31.1 30.0  MCHC 34.1 34.4 33.3  RDW 13.7 13.9 13.8  LYMPHSABS 2.8  --   --   MONOABS 0.6  --   --   EOSABS 0.6  --   --   BASOSABS 0.1  --   --     Chemistries   Recent Labs Lab 07/25/13 0815 07/26/13 0948 07/27/13 0555  NA 142 142 139  K 3.9 4.5 4.5  CL 95* 96 99  CO2 31 33* 27  GLUCOSE 96 116* 138*  BUN 11 10 14   CREATININE 0.73 0.81 0.84  CALCIUM 10.2 10.2 10.0    CBG: No results found for this basename: GLUCAP,  in the last 168 hours  GFR Estimated Creatinine Clearance: 47.8 ml/min (by C-G formula based on Cr of 0.84).  Coagulation profile No results found for this basename: INR, PROTIME,  in the last 168 hours  Cardiac Enzymes No results found for this basename: CK, CKMB, TROPONINI, MYOGLOBIN,  in the last 168 hours  No components found with this basename: POCBNP,  No results found for this basename: DDIMER,  in the last 72 hours No results found for this basename: HGBA1C,  in the last 72 hours No results found for this basename: CHOL, HDL, LDLCALC, TRIG, CHOLHDL, LDLDIRECT,  in the last 72 hours No results found for this basename: TSH, T4TOTAL, FREET3, T3FREE, THYROIDAB,  in the last 72 hours No results found for this basename: VITAMINB12, FOLATE, FERRITIN, TIBC, IRON, RETICCTPCT,  in the last 72 hours No results found for this basename: LIPASE, AMYLASE,  in the last 72 hours  Urine Studies No results found for this basename: UACOL, UAPR, USPG, UPH, UTP, UGL, UKET, UBIL, UHGB, UNIT, UROB, ULEU, UEPI, UWBC, URBC, UBAC, CAST, CRYS, UCOM, BILUA,  in the last 72 hours  MICROBIOLOGY: Recent Results (from the past 240 hour(s))  CULTURE, BLOOD (ROUTINE X 2)     Status: None   Collection Time    07/25/13  8:20 AM      Result Value Ref Range Status     Specimen Description BLOOD LEFT ARM   Final   Special Requests BOTTLES DRAWN AEROBIC ONLY 10CC   Final   Culture  Setup Time     Final   Value: 07/25/2013 12:55     Performed at [place]     Final   Value:        BLOOD CULTURE RECEIVED NO GROWTH TO DATE  CULTURE WILL BE HELD FOR 5 DAYS BEFORE ISSUING A FINAL NEGATIVE REPORT     Performed at [facility]   Report Status PENDING   Incomplete  CULTURE, BLOOD (ROUTINE X 2)     Status: None   Collection Time    07/25/13  8:30 AM      Result Value Ref Range Status   Specimen Description BLOOD RIGHT ARM   Final   Special Requests BOTTLES DRAWN AEROBIC AND ANAEROBIC 10CC   Final   Culture  Setup Time     Final   Value: 07/25/2013 12:55     Performed at [facility]   Culture     Final   Value:        BLOOD CULTURE RECEIVED NO GROWTH TO DATE CULTURE WILL BE HELD FOR 5 DAYS BEFORE ISSUING A FINAL NEGATIVE REPORT     Performed at [facility]   Report Status PENDING   Incomplete  URINE CULTURE     Status: None   Collection Time    07/25/13 10:18 AM      Result Value Ref Range Status   Specimen Description URINE, CLEAN CATCH   Final   Special Requests NONE   Final   Culture  Setup Time     Final   Value: 07/25/2013 18:51     Performed at [place]     Final   Value: >=100,000 COLONIES/ML     Performed at [facility]   Culture     Final   Value: Multiple bacterial morphotypes present, none predominant. Suggest appropriate recollection if clinically indicated.     Performed at [facility]   Report Status 07/26/2013 FINAL   Final    RADIOLOGY STUDIES/RESULTS: Dg Chest 2 View  07/25/2013   CLINICAL DATA:  Shortness of breath and productive cough.  EXAM: CHEST  2 VIEW  COMPARISON:  DG CHEST 2 VIEW dated 08/18/2012; CT ANGIO CHEST W/CM &/OR WO/CM dated 06/26/2012  FINDINGS: Two views of the chest demonstrate new densities in the medial right lower chest.  The right heart border is obscured on the frontal view. Left lung is clear. Heart size is within normal limits. No evidence for pleural effusions. Prominent densities in the right middle lobe region on the lateral view.  IMPRESSION: New densities in the region of the right middle lobe. Findings are most compatible with pneumonia. Recommend follow-up chest radiograph to ensure resolution.   Electronically Signed   By: Markus Daft M.D.   On: 07/25/2013 09:32    Oren Binet, MD  [facility] Pager:[phone]  If 7PM-7AM, please contact night-coverage [email or web address] Password [email or web address] 07/27/2013, 1:50 PM   LOS: 2 days

## 2013-07-28 MED ORDER — LEVOFLOXACIN 750 MG PO TABS: 750.0000 mg | ORAL_TABLET | Freq: Every day | ORAL | Status: DC

## 2013-07-28 MED ORDER — ALBUTEROL SULFATE (2.5 MG/3ML) 0.083% IN NEBU
2.5000 mg | INHALATION_SOLUTION | RESPIRATORY_TRACT | Status: DC | PRN
Start: 2013-07-28 — End: 2014-02-18

## 2013-07-28 MED ORDER — GUAIFENESIN ER 600 MG PO TB12: 600.0000 mg | ORAL_TABLET | Freq: Two times a day (BID) | ORAL | Status: DC

## 2013-07-28 MED ORDER — PREDNISONE 10 MG PO TABS: ORAL_TABLET | ORAL | Status: DC

## 2013-07-28 NOTE — Discharge Summary (Signed)
PATIENT DETAILS Name: [name] Age: 73 y.o. Sex: female Date of Birth: 1940/10/05 MRN: [id number]. Admit Date: 07/25/2013 Admitting Physician: Oswald Hillock, MD YQM:VHQI Plotnikov, MD  Recommendations for Outpatient Follow-up:  1. Please recheck chest x-ray in 6-8 weeks to document resolution of the infiltrate.  PRIMARY DISCHARGE DIAGNOSIS:  Active Problems:   Pneumonia   Acute asthmatic bronchitis      PAST MEDICAL HISTORY: Past Medical History  Diagnosis Date  . Anxiety   . Asthma   . HTN (hypertension)   . Allergic rhinitis     DISCHARGE MEDICATIONS:   Medication List         acetaminophen 325 MG tablet  Commonly known as:  TYLENOL  Take 325 mg by mouth every 6 (six) hours as needed. For pain     albuterol 108 (90 BASE) MCG/ACT inhaler  Commonly known as:  PROAIR HFA  Inhale 2 puffs into the lungs every 4 (four) hours as needed for wheezing or shortness of breath. For shortness of breath     albuterol (2.5 MG/3ML) 0.083% nebulizer solution  Commonly known as:  PROVENTIL  Take 3 mLs (2.5 mg total) by nebulization every 4 (four) hours as needed for wheezing or shortness of breath.     budesonide-formoterol 160-4.5 MCG/ACT inhaler  Commonly known as:  SYMBICORT  Inhale 1 puff into the lungs 2 (two) times daily.     diltiazem 120 MG tablet  Commonly known as:  CARDIZEM  Take 120 mg by mouth 2 (two) times daily.     diphenhydrAMINE 25 MG tablet  Commonly known as:  SOMINEX  Take 25 mg by mouth at bedtime as needed for itching, allergies or sleep.     guaiFENesin 600 MG 12 hr tablet  Commonly known as:  MUCINEX  Take 1 tablet (600 mg total) by mouth 2 (two) times daily.     levofloxacin 750 MG tablet  Commonly known as:  LEVAQUIN  Take 1 tablet (750 mg total) by mouth daily.     loratadine 10 MG tablet  Commonly known as:  CLARITIN  Take 10 mg by mouth daily as needed for allergies.     MUCINEX DM PO  Take 1 tablet by mouth daily.     multivitamin tablet  Take 1 tablet by mouth daily.     predniSONE 10 MG tablet  Commonly known as:  DELTASONE  - Take 4 tablets (40 mg) daily for 2 days, then,  - Take 3 tablets (30 mg) daily for 2 days, then,  - Take 2 tablets (20 mg) daily for 2 days, then,  - Take 1 tablets (10 mg) daily for 1 days, then stop     VITAMIN B COMPLEX PO  Take 1 tablet by mouth daily as needed (takes when she remembers to take it).     VITAMIN C PO  Take 1 tablet by mouth daily.        ALLERGIES:   Allergies  Allergen Reactions  . Penicillins Swelling    Throat swelling  . Shellfish Allergy Anaphylaxis  . Sulfonamide Derivatives Swelling    Throat swelling  . Aspirin Other (See Comments)    States stomach bubbles, becomes gaseous and irritated  . Bee Venom   . Clindamycin     REACTION: Neck, tongue swelling, SOB \\T \ rash  . Fluticasone-Salmeterol     REACTION: hoarseness  . Fruit & Vegetable Daily [Nutritional Supplements]     Tongue swelling, vomiting  . Montelukast Sodium  REACTION: hallucination    BRIEF HPI:  See H&P, Labs, Consult and Test reports for all details in brief, patient was admitted for cough and worsening shortness of breath. She was thought to have pneumonia, and an acute asthma exacerbation. She was admitted for further evaluation and treatment.  CONSULTATIONS:   None  PERTINENT RADIOLOGIC STUDIES: Dg Chest 2 View  07/25/2013   CLINICAL DATA:  Shortness of breath and productive cough.  EXAM: CHEST  2 VIEW  COMPARISON:  DG CHEST 2 VIEW dated 08/18/2012; CT ANGIO CHEST W/CM &/OR WO/CM dated 06/26/2012  FINDINGS: Two views of the chest demonstrate new densities in the medial right lower chest. The right heart border is obscured on the frontal view. Left lung is clear. Heart size is within normal limits. No evidence for pleural effusions. Prominent densities in the right middle lobe region on the lateral view.  IMPRESSION: New densities in the region of the right  middle lobe. Findings are most compatible with pneumonia. Recommend follow-up chest radiograph to ensure resolution.   Electronically Signed   By: Markus Daft M.D.   On: 07/25/2013 09:32     PERTINENT LAB RESULTS: CBC:  Recent Labs  07/26/13 0948 07/27/13 0555  WBC 6.3 5.4  HGB 15.7* 14.4  HCT 45.7 43.2  PLT 337 336   CMET CMP     Component Value Date/Time   NA 139 07/27/2013 0555   K 4.5 07/27/2013 0555   CL 99 07/27/2013 0555   CO2 27 07/27/2013 0555   GLUCOSE 138* 07/27/2013 0555   BUN 14 07/27/2013 0555   CREATININE 0.84 07/27/2013 0555   CALCIUM 10.0 07/27/2013 0555   PROT 8.1 07/26/2013 0948   ALBUMIN 4.0 07/26/2013 0948   AST 20 07/26/2013 0948   ALT 12 07/26/2013 0948   ALKPHOS 71 07/26/2013 0948   BILITOT 0.5 07/26/2013 0948   GFRNONAA 68* 07/27/2013 0555   GFRAA 79* 07/27/2013 0555    GFR Estimated Creatinine Clearance: 47.8 ml/min (by C-G formula based on Cr of 0.84). No results found for this basename: LIPASE, AMYLASE,  in the last 72 hours No results found for this basename: CKTOTAL, CKMB, CKMBINDEX, TROPONINI,  in the last 72 hours No components found with this basename: POCBNP,  No results found for this basename: DDIMER,  in the last 72 hours No results found for this basename: HGBA1C,  in the last 72 hours No results found for this basename: CHOL, HDL, LDLCALC, TRIG, CHOLHDL, LDLDIRECT,  in the last 72 hours No results found for this basename: TSH, T4TOTAL, FREET3, T3FREE, THYROIDAB,  in the last 72 hours No results found for this basename: VITAMINB12, FOLATE, FERRITIN, TIBC, IRON, RETICCTPCT,  in the last 72 hours Coags: No results found for this basename: PT, INR,  in the last 72 hours Microbiology: Recent Results (from the past 240 hour(s))  CULTURE, BLOOD (ROUTINE X 2)     Status: None   Collection Time    07/25/13  8:20 AM      Result Value Ref Range Status   Specimen Description BLOOD LEFT ARM   Final   Special Requests BOTTLES DRAWN AEROBIC ONLY 10CC    Final   Culture  Setup Time     Final   Value: 07/25/2013 12:55     Performed at [facility]   Culture     Final   Value:        BLOOD CULTURE RECEIVED NO GROWTH TO [place]  5 DAYS BEFORE ISSUING A FINAL NEGATIVE REPORT     Performed at [facility]   Report Status PENDING   Incomplete  CULTURE, BLOOD (ROUTINE X 2)     Status: None   Collection Time    07/25/13  8:30 AM      Result Value Ref Range Status   Specimen Description BLOOD RIGHT ARM   Final   Special Requests BOTTLES DRAWN AEROBIC AND ANAEROBIC 10CC   Final   Culture  Setup Time     Final   Value: 07/25/2013 12:55     Performed at [facility]   Culture     Final   Value:        BLOOD CULTURE RECEIVED NO GROWTH TO DATE CULTURE WILL BE HELD FOR 5 DAYS BEFORE ISSUING A FINAL NEGATIVE REPORT     Performed at [facility]   Report Status PENDING   Incomplete  URINE CULTURE     Status: None   Collection Time    07/25/13 10:18 AM      Result Value Ref Range Status   Specimen Description URINE, CLEAN CATCH   Final   Special Requests NONE   Final   Culture  Setup Time     Final   Value: 07/25/2013 18:51     Performed at [facility]     Final   Value: >=100,000 COLONIES/ML     Performed at [facility]   Culture     Final   Value: Multiple bacterial morphotypes present, none predominant. Suggest appropriate recollection if clinically indicated.     Performed at [facility]   Report Status 07/26/2013 FINAL   Final     BRIEF HOSPITAL COURSE:  Community Acquired PNA  - Patient was admitted and started on Levaquin, she quickly made clinical improvement afebrile and with no leukocytosis. -urine strep pneumo antigen-neg, legionella antigen negative, Influenza PCR neg. Blood culture on 07/25/13 also negative.  - Since she is clinically improved, she will be transitioned to oral Levaquin for another 3-4 more days and then be discharged  home. - She should have a repeat chest x-ray done in 6-8 weeks to document resolution of her infiltrate-would defer this to her primary care practitioner.  Acute on Chronic Resp Failure  -normally on 2 L of O2 at home, requiring 3 L on admission, now back to usual baseline  -secondary to PNA, Asthma exac- much better with Abx, Nebs/steroids   Asthma Exacerbation  - Started on nebulized bronchodilators, IV Solumedrol- with significant improvement. Lungs are completely clear by the day of discharge. She was placed on a tapering course of prednisone. Continue with her usual inhaler regimen.  Hx of HTN  -moderate control-c/w Cardizem   Anxiety  -prn Xanax  TODAY-DAY OF DISCHARGE:  Subjective:   Charma Igo today has no headache,no chest abdominal pain,no new weakness tingling or numbness, feels much better wants to go home today.   Objective:   Blood pressure 101/55, pulse 69, temperature 97.8 F (36.6 C), temperature source Oral, resp. rate 18, height 5\' 2"  (1.575 m), weight 50.032 kg (110 lb 4.8 oz), SpO2 97.00%.  Intake/Output Summary (Last 24 hours) at 07/28/13 0948 Last data filed at 07/27/13 1112  Gross per 24 hour  Intake    300 ml  Output      0 ml  Net    300 ml   Filed Weights   07/25/13 1208  Weight:  50.032 kg (110 lb 4.8 oz)    Exam Awake Alert, Oriented *3, No new F.N deficits, Normal affect [place].AT,PERRAL Supple Neck,No JVD, No cervical lymphadenopathy appriciated.  Symmetrical Chest wall movement, Good air movement bilaterally, CTAB RRR,No Gallops,Rubs or new Murmurs, No Parasternal Heave +ve B.Sounds, Abd Soft, Non tender, No organomegaly appriciated, No rebound -guarding or rigidity. No Cyanosis, Clubbing or edema, No new Rash or bruise  DISCHARGE CONDITION: Stable  DISPOSITION: Home  DISCHARGE INSTRUCTIONS:    Activity:  As tolerated with Full fall precautions use walker/cane & assistance as needed  Diet recommendation: Heart Healthy diet        Discharge Orders   Future Orders Complete By Expires   Call MD for:  difficulty breathing, headache or visual disturbances  As directed    Diet - low sodium heart healthy  As directed    Increase activity slowly  As directed       Follow-up Information   Follow up with Walker Kehr, MD. Schedule an appointment as soon as possible for a visit in 2 weeks.   Specialty:  Internal Medicine   Contact information:   [address] [place] [address] [phone]       Follow up with Kathee Delton, MD In 2 weeks.   Specialty:  Pulmonary Disease   Contact information:   [place] [id number] [phone]         Total Time spent on discharge equals 45 minutes.  SignedOren Binet 07/28/2013 9:48 AM

## 2013-07-28 NOTE — Care Management Note (Signed)
    Page 1 of 1   07/28/2013     10:52:55 AM   CARE MANAGEMENT NOTE 07/28/2013  Patient:  ADIANNA, DARWIN   Account Number:  [id number]  Date Initiated:  07/28/2013  Documentation initiated by:  Tomi Bamberger  Subjective/Objective Assessment:   dx pna  admit- lives with spouse.  has home oxygen with [facility].     Action/Plan:   Anticipated DC Date:  07/28/2013   Anticipated DC Plan:  HOME/SELF CARE      DC Planning Services  CM consult      PAC Choice  DURABLE MEDICAL EQUIPMENT   Choice offered to / List presented to:  C-1 Patient   DME arranged  NEBULIZER MACHINE      DME agency  [facility].        Status of service:  Completed, signed off Medicare Important Message given?   (If response is "NO", the following Medicare IM given date fields will be blank) Date Medicare IM given:   Date Additional Medicare IM given:    Discharge Disposition:  HOME/SELF CARE  Per UR Regulation:  Reviewed for med. necessity/level of care/duration of stay  If discussed at [place] of Stay Meetings, dates discussed:    Comments:  07/28/13 Ravinia, BSN [phone] patient lives with spouse, patient is for dc today, will need nebulizer machine, she chose [facility] since she has oxygen at home with [facility], notified Justin with [facility] , he will bring neb machine up to patient's room.  MD will give patient scripts for neb machine.  Patient has oxygen tank to go home with in her room.

## 2013-07-31 LAB — CULTURE, BLOOD (ROUTINE X 2)
CULTURE: NO GROWTH
Culture: NO GROWTH

## 2013-10-22 ENCOUNTER — Other Ambulatory Visit: Payer: Self-pay | Admitting: Internal Medicine

## 2013-11-16 ENCOUNTER — Other Ambulatory Visit: Payer: Self-pay | Admitting: Internal Medicine

## 2013-12-16 ENCOUNTER — Telehealth: Payer: Self-pay

## 2013-12-16 NOTE — Telephone Encounter (Signed)
LVM for pt to call back and schedule AWV.

## 2014-02-18 ENCOUNTER — Encounter (HOSPITAL_COMMUNITY): Payer: Self-pay | Admitting: Emergency Medicine

## 2014-02-18 ENCOUNTER — Emergency Department (HOSPITAL_COMMUNITY): Payer: Medicare Other

## 2014-02-18 ENCOUNTER — Emergency Department (HOSPITAL_COMMUNITY)
Admission: EM | Admit: 2014-02-18 | Discharge: 2014-02-18 | Disposition: A | Discharge status: Home / Self Care | Payer: Medicare Other | Attending: Emergency Medicine | Admitting: Emergency Medicine

## 2014-02-18 DIAGNOSIS — Z79899 Other long term (current) drug therapy: Secondary | ICD-10-CM | POA: Insufficient documentation

## 2014-02-18 DIAGNOSIS — Z880 Allergy status to penicillin: Secondary | ICD-10-CM | POA: Diagnosis not present

## 2014-02-18 DIAGNOSIS — R0981 Nasal congestion: Secondary | ICD-10-CM | POA: Diagnosis not present

## 2014-02-18 DIAGNOSIS — F419 Anxiety disorder, unspecified: Secondary | ICD-10-CM | POA: Insufficient documentation

## 2014-02-18 DIAGNOSIS — R197 Diarrhea, unspecified: Secondary | ICD-10-CM | POA: Insufficient documentation

## 2014-02-18 DIAGNOSIS — J471 Bronchiectasis with (acute) exacerbation: Secondary | ICD-10-CM | POA: Diagnosis not present

## 2014-02-18 DIAGNOSIS — Z91040 Latex allergy status: Secondary | ICD-10-CM | POA: Diagnosis not present

## 2014-02-18 DIAGNOSIS — R0600 Dyspnea, unspecified: Secondary | ICD-10-CM | POA: Diagnosis not present

## 2014-02-18 DIAGNOSIS — J9801 Acute bronchospasm: Secondary | ICD-10-CM

## 2014-02-18 DIAGNOSIS — R0602 Shortness of breath: Secondary | ICD-10-CM | POA: Diagnosis present

## 2014-02-18 DIAGNOSIS — I10 Essential (primary) hypertension: Secondary | ICD-10-CM | POA: Diagnosis not present

## 2014-02-18 DIAGNOSIS — J45901 Unspecified asthma with (acute) exacerbation: Secondary | ICD-10-CM | POA: Insufficient documentation

## 2014-02-18 LAB — CBC WITH DIFFERENTIAL/PLATELET
BASOS ABS: 0.1 10*3/uL (ref 0.0–0.1)
Basophils Relative: 1 % (ref 0–1)
Eosinophils Absolute: 0.7 10*3/uL (ref 0.0–0.7)
Eosinophils Relative: 10 % — ABNORMAL HIGH (ref 0–5)
HEMATOCRIT: 45.7 % (ref 36.0–46.0)
Hemoglobin: 14.9 g/dL (ref 12.0–15.0)
Lymphocytes Relative: 29 % (ref 12–46)
Lymphs Abs: 2 10*3/uL (ref 0.7–4.0)
MCH: 29.3 pg (ref 26.0–34.0)
MCHC: 32.6 g/dL (ref 30.0–36.0)
MCV: 90 fL (ref 78.0–100.0)
Monocytes Absolute: 0.6 10*3/uL (ref 0.1–1.0)
Monocytes Relative: 8 % (ref 3–12)
Neutro Abs: 3.5 10*3/uL (ref 1.7–7.7)
Neutrophils Relative %: 52 % (ref 43–77)
PLATELETS: 295 10*3/uL (ref 150–400)
RBC: 5.08 MIL/uL (ref 3.87–5.11)
RDW: 13.5 % (ref 11.5–15.5)
WBC: 6.7 10*3/uL (ref 4.0–10.5)

## 2014-02-18 LAB — BASIC METABOLIC PANEL
ANION GAP: 9 (ref 5–15)
BUN: 16 mg/dL (ref 6–23)
CALCIUM: 9.7 mg/dL (ref 8.4–10.5)
CO2: 31 mEq/L (ref 19–32)
CREATININE: 0.84 mg/dL (ref 0.50–1.10)
Chloride: 99 mEq/L (ref 96–112)
GFR calc Af Amer: 79 mL/min — ABNORMAL LOW (ref >90)
GFR calc non Af Amer: 68 mL/min — ABNORMAL LOW (ref >90)
Glucose, Bld: 98 mg/dL (ref 70–99)
Potassium: 4.4 mEq/L (ref 3.7–5.3)
SODIUM: 139 meq/L (ref 137–147)

## 2014-02-18 MED ORDER — PREDNISONE 20 MG PO TABS: 60.0000 mg | ORAL_TABLET | Freq: Once | ORAL | Status: DC

## 2014-02-18 MED ORDER — ALBUTEROL SULFATE HFA 108 (90 BASE) MCG/ACT IN AERS: 2.0000 | INHALATION_SPRAY | RESPIRATORY_TRACT | Status: DC | PRN

## 2014-02-18 MED ORDER — ALBUTEROL SULFATE (2.5 MG/3ML) 0.083% IN NEBU: 2.5000 mg | INHALATION_SOLUTION | RESPIRATORY_TRACT | Status: DC | PRN

## 2014-02-18 MED ORDER — METHYLPREDNISOLONE SODIUM SUCC 125 MG IJ SOLR
125.0000 mg | Freq: Once | INTRAMUSCULAR | Status: AC
  Administered 2014-02-18: 125 mg via INTRAVENOUS
  Filled 2014-02-18: qty 2

## 2014-02-18 MED ORDER — PREDNISONE 10 MG PO TABS
ORAL_TABLET | ORAL | Status: DC
Start: 2014-02-18 — End: 2014-07-08

## 2014-02-18 MED ORDER — IPRATROPIUM-ALBUTEROL 0.5-2.5 (3) MG/3ML IN SOLN
3.0000 mL | Freq: Once | RESPIRATORY_TRACT | Status: AC
  Administered 2014-02-18: 3 mL via RESPIRATORY_TRACT
  Filled 2014-02-18: qty 3

## 2014-02-18 NOTE — ED Notes (Signed)
Patient presents with c/o SOB (audible exp wheezes noted) also states she has been having a lot of stools.  States they are not loose

## 2014-02-18 NOTE — ED Provider Notes (Signed)
CSN: [id number]     Arrival date & time 02/18/14  0325 History   First MD Initiated Contact with Patient 02/18/14 0357     Chief Complaint  Patient presents with  . Nasal Congestion  . Shortness of Breath  . Diarrhea     (Consider location/radiation/quality/duration/timing/severity/associated sxs/prior Treatment) HPI 73 year old female presents to emergency room from home with complaint of shortness of breath and wheezing.  Symptoms have been ongoing for the last 3 days.  Patient has history of asthma and bronchiectasis.  She has home oxygen which she uses rarely.  She has been using it over the last few days.  Husband reports her oxygen levels have been in the high 80s to low 90s.  He also reports that she is not eating very much.  Patient reports over the last several weeks she notices that she has increased bowel movements.  Around 30 minutes after eating, she will have a formed bowel movement.  She denies any abdominal pain or diarrhea.  Tonight she came in to 2 worsening facial congestion.  She reports since using oxygen, she feels like her sinuses are backing up.  She denies any nasal congestion.  She has had no fever or chills.  She's had increased cough and sputum production.  Patient has been taking her Symbicort without improvement.  She has not been using any albuterol as she feels that the Symbicort works better than the albuterol. Past Medical History  Diagnosis Date  . Anxiety   . Asthma   . HTN (hypertension)   . Allergic rhinitis    Past Surgical History  Procedure Laterality Date  . Tubal ligation     Family History  Problem Relation Age of Onset  . Diabetes Father   . Asthma Father   . Allergies Father   . Mental illness Mother     alzheimer's  . Allergies Brother   . Allergies Brother   . Heart disease Maternal Aunt    History  Substance Use Topics  . Smoking status: Never Smoker   . Smokeless tobacco: Never Used     Comment: father smoked, worked w/smokers   . Alcohol Use: No   OB History   Grav Para Term Preterm Abortions TAB SAB Ect Mult Living                 Review of Systems  See History of Present Illness; otherwise all other systems are reviewed and negative   Allergies  Penicillins; Shellfish allergy; Sulfonamide derivatives; Aspirin; Bee venom; Clindamycin; Fluticasone-salmeterol; Fruit & vegetable daily; Latex; and Montelukast sodium  Home Medications   Prior to Admission medications   Medication Sig Start Date End Date Taking? Authorizing Provider  acetaminophen (TYLENOL) 325 MG tablet Take 325 mg by mouth every 6 (six) hours as needed. For pain   Yes Historical Provider, MD  albuterol (PROAIR HFA) 108 (90 BASE) MCG/ACT inhaler Inhale 2 puffs into the lungs every 4 (four) hours as needed for wheezing or shortness of breath. For shortness of breath 06/24/12  Yes Deneise Lever, MD  albuterol (PROVENTIL) (2.5 MG/3ML) 0.083% nebulizer solution Take 3 mLs (2.5 mg total) by nebulization every 4 (four) hours as needed for wheezing or shortness of breath. 07/28/13  Yes Shanker Kristeen Mans, MD  Ascorbic Acid (VITAMIN C PO) Take 1 tablet by mouth daily.    Yes Historical Provider, MD  B Complex Vitamins (VITAMIN B COMPLEX PO) Take 1 tablet by mouth daily as needed (takes when she remembers  to take it).    Yes Historical Provider, MD  budesonide-formoterol (SYMBICORT) 160-4.5 MCG/ACT inhaler Inhale 1 puff into the lungs 2 (two) times daily. 06/07/11  Yes Aleksei Plotnikov V, MD  Dextromethorphan-Guaifenesin (MUCINEX DM PO) Take 1 tablet by mouth daily as needed (for congestion).    Yes Historical Provider, MD  diltiazem (CARDIZEM) 120 MG tablet take 1 tablet by mouth twice a day 10/22/13  Yes Aleksei Plotnikov V, MD  diphenhydrAMINE (BENADRYL) 25 MG tablet Take 25 mg by mouth every 6 (six) hours as needed for allergies.   Yes Historical Provider, MD  loratadine (CLARITIN) 10 MG tablet Take 10 mg by mouth daily as needed for allergies.  08/16/10   Yes Aleksei Plotnikov V, MD  Multiple Vitamin (MULTIVITAMIN) tablet Take 1 tablet by mouth daily.     Yes Historical Provider, MD   BP 179/80  Pulse 71  Temp(Src) 98 F (36.7 C) (Oral)  Resp 26  Ht 5\' 2"  (1.575 m)  Wt 120 lb (54.432 kg)  BMI 21.94 kg/m2  SpO2 99% Physical Exam  Nursing note and vitals reviewed. Constitutional: She is oriented to person, place, and time. She appears well-developed and well-nourished.  HENT:  Head: Normocephalic and atraumatic.  Nose: Nose normal.  Mouth/Throat: Oropharynx is clear and moist.  Eyes: Conjunctivae and EOM are normal. Pupils are equal, round, and reactive to light.  Neck: Normal range of motion. Neck supple. No JVD present. No tracheal deviation present. No thyromegaly present.  Cardiovascular: Normal rate, regular rhythm, normal heart sounds and intact distal pulses.  Exam reveals no gallop and no friction rub.   No murmur heard. Pulmonary/Chest: No stridor. She is in respiratory distress (mild with tachypnea). She has wheezes. She has no rales. She exhibits no tenderness.  Abdominal: Soft. Bowel sounds are normal. She exhibits no distension and no mass. There is no tenderness. There is no rebound and no guarding.  Musculoskeletal: Normal range of motion. She exhibits no edema and no tenderness.  Lymphadenopathy:    She has no cervical adenopathy.  Neurological: She is alert and oriented to person, place, and time. She displays normal reflexes. She exhibits normal muscle tone. Coordination normal.  Skin: Skin is warm and dry. No rash noted. No erythema. No pallor.  Psychiatric: She has a normal mood and affect. Her behavior is normal. Judgment and thought content normal.    ED Course  Procedures (including critical care time) Labs Review Labs Reviewed  CBC WITH DIFFERENTIAL - Abnormal; Notable for the following:    Eosinophils Relative 10 (*)    All other components within normal limits  BASIC METABOLIC PANEL - Abnormal; Notable  for the following:    GFR calc non Af Amer 68 (*)    GFR calc Af Amer 79 (*)    All other components within normal limits    Imaging Review Dg Chest 2 View  02/18/2014   CLINICAL DATA:  Dyspnea for 3 days.  EXAM: CHEST  2 VIEW  COMPARISON:  07/25/2013  FINDINGS: Nodular opacities in the lung bases may be due to infiltration but metastasis or pulmonary nodules are not excluded. Suggest CT for further evaluation. Persistent collapse of right middle lung. Central obstructing lesion not excluded. Emphysematous changes in the lungs. Normal heart size and pulmonary vascularity.  IMPRESSION: Nodular opacities and persistent collapse of right middle lung. Suggest CT to exclude neoplasm. Diffuse emphysematous changes in the lungs.   Electronically Signed   By: Lucienne Capers M.D.   On:  02/18/2014 04:51     EKG Interpretation None      MDM   Final diagnoses:  Dyspnea  Bronchospasm  Bronchiectasis with acute exacerbation    73 year old female with bronchiectasis, asthma who presents with wheezing and cough.  Plan for chest x-ray, labs, steroids, albuterol Atrovent.  Patient may need admission as even with her home O2, it is reported that she has had desaturations into the 80s.  We'll  reassess after initial breathing treatment.  5:46 AM Patient with persistent wheezing after initial meds.  Second neb ordered.  Chest x-ray with nodular opacities and persistent collapse of right middle lung questionable infiltration, but question of metastasis.  No fever or elevated white blood cell count.  Oxygen levels have improved slightly after first neb treatment.  We'll continue to monitor.  6:28 AM Pt much improved, still with some coarse breath sounds, but wheezing has resolved.  97% on room air.  Pt ambulated without drop in oxygen saturations.  Will d/c home with steroids, antibiotics, and close f/u with pulmonology and/or pcp.  Kalman Drape, MD 02/18/14 [phone]

## 2014-02-18 NOTE — ED Notes (Signed)
Pt states she has been taking Symbicort and has been having irritation in her intestines from the medication.  Pt is tender to the LLQ that has worsened today.  Pt states she has been to the bathroom  x4 in the past 24 hours with "normal" bowel movements, denies diarrhea.    Pt has audible wheezing in the left and right lobes.

## 2014-02-18 NOTE — ED Notes (Signed)
While ambulating pt. Pt sat at 94% the whole time, no complaints while ambulating. Pt actually stated that walking made her feel better.

## 2014-02-18 NOTE — ED Notes (Signed)
Pt transported to Xray. 

## 2014-02-18 NOTE — ED Notes (Signed)
Pt ambulating independently w/ steady gait on d/c in no acute distress, A&Ox4. Rx given x1

## 2014-02-18 NOTE — Discharge Instructions (Signed)
Start steroids as prescribed.  Please use albuterol either by nebulizer or by inhaler every 4 hours for shortness of breath or wheezing.  Use your oxygen as needed.  Return to the ER for worsening condition or new concerning symptoms.  Your chest xray was abnormal today, and a follow up CT scan was recommended.  This can be done through your primary care doctor's office.    With your more frequent bowel movements after eating-try to keep a journal of what you are eating to see if there are certain foods that are triggering your symptoms.   Bronchiectasis Bronchiectasis is a condition in which the airways (bronchi) are damaged and widened. This makes it difficult for the lungs to get rid of mucus. As a result, mucus gathers in the airways, and this often leads to lung infections. Infection can cause inflammation in the airways, which may further weaken and damage the bronchi.  CAUSES  Bronchiectasis may be present at birth (congenital) or may develop later in life. Sometimes there is no apparent cause. Some common causes include:  Cystic fibrosis.   Recurrent lung infections (such as pneumonia, tuberculosis, or fungal infections).  Foreign bodies or other blockages in the lungs.  Breathing in fluid, food, or other foreign objects (aspiration). SIGNS AND SYMPTOMS  Common symptoms include:  A daily cough that brings up mucus and lasts for more than 3 weeks.  Frequent lung infections (such as pneumonia, tuberculosis, or fungal infections).  Shortness of breath and wheezing.   Weakness and fatigue. DIAGNOSIS  Various tests may be done to help diagnose bronchiectasis. Tests may include:  Chest X-rays or CT scans.   Breathing tests to help determine how your lungs are working.   Sputum cultures to check for infection.   Blood tests and other tests to check for related diseases or causes, such as cystic fibrosis. TREATMENT  Treatment varies depending on the severity of the  condition. Medicines may be given to loosen the mucus to be coughed up (expectorants), to relax the muscles of the air passages (bronchodilators), or to prevent or treat infections (antibiotics). Physical therapy methods may be recommended to help clear mucus from the lungs. For severe cases, surgery may be done to remove the affected part of the lung. HOME CARE INSTRUCTIONS   Get plenty of rest.   Only take over-the-counter or prescription medicines as directed by your health care provider. If antibiotic medicines were prescribed, take them as directed. Finish them even if you start to feel better.  Avoid sedatives and antihistamines unless otherwise directed by your health care provider. These medicines tend to thicken the mucus in the lungs.   Perform any breathing exercises or techniques to clear the lungs as directed by your health care provider.  Drink enough fluids to keep your urine clear or pale yellow.  Consider using a cold steam vaporizer or humidifier in your room or home to help loosen secretions.   If the cough is worse at night, try sleeping in a semi-upright position in a recliner or using a couple of pillows.   Avoid cigarette smoke and lung irritants. If you smoke, quit.  Stay inside when pollution and ozone levels are high.   Stay current with vaccinations and immunizations.   Follow up with your health care provider as directed.  SEEK MEDICAL CARE IF:  You cough up more thick, discolored mucus (sputum) that is yellow to green in color.  You have a fever or persistent symptoms for more than 2-3  days.  You cannot control your cough and are losing sleep. SEEK IMMEDIATE MEDICAL CARE IF:   You cough up blood.   You have chest pain or increasing shortness of breath.   You have pain that is getting worse or is uncontrolled with medicines.   You have a fever and your symptoms suddenly get worse. MAKE SURE YOU:  Understand these instructions.    Will watch your condition.   Will get help right away if you are not doing well or get worse.  Document Released: 02/11/2007 Document Revised: 04/21/2013 Document Reviewed: 10/22/2012 [facility] Patient Information 2015 [place], [place]. This information is not intended to replace advice given to you by your health care provider. Make sure you discuss any questions you have with your health care provider.  Bronchospasm A bronchospasm is a spasm or tightening of the airways going into the lungs. During a bronchospasm breathing becomes more difficult because the airways get smaller. When this happens there can be coughing, a whistling sound when breathing (wheezing), and difficulty breathing. Bronchospasm is often associated with asthma, but not all patients who experience a bronchospasm have asthma. CAUSES  A bronchospasm is caused by inflammation or irritation of the airways. The inflammation or irritation may be triggered by:   Allergies (such as to animals, pollen, food, or mold). Allergens that cause bronchospasm may cause wheezing immediately after exposure or many hours later.   Infection. Viral infections are believed to be the most common cause of bronchospasm.   Exercise.   Irritants (such as pollution, cigarette smoke, strong odors, aerosol sprays, and paint fumes).   Weather changes. Winds increase molds and pollens in the air. Rain refreshes the air by washing irritants out. Cold air may cause inflammation.   Stress and emotional upset.  SIGNS AND SYMPTOMS   Wheezing.   Excessive nighttime coughing.   Frequent or severe coughing with a simple cold.   Chest tightness.   Shortness of breath.  DIAGNOSIS  Bronchospasm is usually diagnosed through a history and physical exam. Tests, such as chest X-rays, are sometimes done to look for other conditions. TREATMENT   Inhaled medicines can be given to open up your airways and help you breathe. The medicines can  be given using either an inhaler or a nebulizer machine.  Corticosteroid medicines may be given for severe bronchospasm, usually when it is associated with asthma. HOME CARE INSTRUCTIONS   Always have a plan prepared for seeking medical care. Know when to call your health care provider and local emergency services (911 in the [place] Know where you can access local emergency care.  Only take medicines as directed by your health care provider.  If you were prescribed an inhaler or nebulizer machine, ask your health care provider to explain how to use it correctly. Always use a spacer with your inhaler if you were given one.  It is necessary to remain calm during an attack. Try to relax and breathe more slowly.  Control your home environment in the following ways:   Change your heating and air conditioning filter at least once a month.   Limit your use of fireplaces and wood stoves.  Do not smoke and do not allow smoking in your home.   Avoid exposure to perfumes and fragrances.   Get rid of pests (such as roaches and mice) and their droppings.   Throw away plants if you see mold on them.   Keep your house clean and dust free.   Replace carpet with  wood, tile, or vinyl flooring. Carpet can trap dander and dust.   Use allergy-proof pillows, mattress covers, and box spring covers.   Wash bed sheets and blankets every week in hot water and dry them in a dryer.   Use blankets that are made of polyester or cotton.   Wash hands frequently. SEEK MEDICAL CARE IF:   You have muscle aches.   You have chest pain.   The sputum changes from clear or white to yellow, green, gray, or bloody.   The sputum you cough up gets thicker.   There are problems that may be related to the medicine you are given, such as a rash, itching, swelling, or trouble breathing.  SEEK IMMEDIATE MEDICAL CARE IF:   You have worsening wheezing and coughing even after taking your prescribed  medicines.   You have increased difficulty breathing.   You develop severe chest pain. MAKE SURE YOU:   Understand these instructions.  Will watch your condition.  Will get help right away if you are not doing well or get worse. Document Released: 04/19/2003 Document Revised: 04/21/2013 Document Reviewed: 10/06/2012 [facility] Patient Information 2015 [place], [place]. This information is not intended to replace advice given to you by your health care provider. Make sure you discuss any questions you have with your health care provider.  How to Use an Inhaler Proper inhaler technique is very important. Good technique ensures that the medicine reaches the lungs. Poor technique results in depositing the medicine on the tongue and back of the throat rather than in the airways. If you do not use the inhaler with good technique, the medicine will not help you. STEPS TO FOLLOW IF USING AN INHALER WITHOUT AN EXTENSION TUBE 1. Remove the cap from the inhaler. 2. If you are using the inhaler for the first time, you will need to prime it. Shake the inhaler for 5 seconds and release four puffs into the air, away from your face. Ask your health care provider or pharmacist if you have questions about priming your inhaler. 3. Shake the inhaler for 5 seconds before each breath in (inhalation). 4. Position the inhaler so that the top of the canister faces up. 5. Put your index finger on the top of the medicine canister. Your thumb supports the bottom of the inhaler. 6. Open your mouth. 7. Either place the inhaler between your teeth and place your lips tightly around the mouthpiece, or hold the inhaler 1-2 inches away from your open mouth. If you are unsure of which technique to use, ask your health care provider. 8. Breathe out (exhale) normally and as completely as possible. 9. Press the canister down with your index finger to release the medicine. 10. At the same time as the canister is pressed, inhale  deeply and slowly until your lungs are completely filled. This should take 4-6 seconds. Keep your tongue down. 11. Hold the medicine in your lungs for 5-10 seconds (10 seconds is best). This helps the medicine get into the small airways of your lungs. 12. Breathe out slowly, through pursed lips. Whistling is an example of pursed lips. 13. Wait at least 15-30 seconds between puffs. Continue with the above steps until you have taken the number of puffs your health care provider has ordered. Do not use the inhaler more than your health care provider tells you. 14. Replace the cap on the inhaler. 15. Follow the directions from your health care provider or the inhaler insert for cleaning the inhaler. STEPS TO  FOLLOW IF USING AN INHALER WITH AN EXTENSION (SPACER) 1. Remove the cap from the inhaler. 2. If you are using the inhaler for the first time, you will need to prime it. Shake the inhaler for 5 seconds and release four puffs into the air, away from your face. Ask your health care provider or pharmacist if you have questions about priming your inhaler. 3. Shake the inhaler for 5 seconds before each breath in (inhalation). 4. Place the open end of the spacer onto the mouthpiece of the inhaler. 5. Position the inhaler so that the top of the canister faces up and the spacer mouthpiece faces you. 6. Put your index finger on the top of the medicine canister. Your thumb supports the bottom of the inhaler and the spacer. 7. Breathe out (exhale) normally and as completely as possible. 8. Immediately after exhaling, place the spacer between your teeth and into your mouth. Close your lips tightly around the spacer. 9. Press the canister down with your index finger to release the medicine. 10. At the same time as the canister is pressed, inhale deeply and slowly until your lungs are completely filled. This should take 4-6 seconds. Keep your tongue down and out of the way. 11. Hold the medicine in your lungs for  5-10 seconds (10 seconds is best). This helps the medicine get into the small airways of your lungs. Exhale. 12. Repeat inhaling deeply through the spacer mouthpiece. Again hold that breath for up to 10 seconds (10 seconds is best). Exhale slowly. If it is difficult to take this second deep breath through the spacer, breathe normally several times through the spacer. Remove the spacer from your mouth. 13. Wait at least 15-30 seconds between puffs. Continue with the above steps until you have taken the number of puffs your health care provider has ordered. Do not use the inhaler more than your health care provider tells you. 14. Remove the spacer from the inhaler, and place the cap on the inhaler. 15. Follow the directions from your health care provider or the inhaler insert for cleaning the inhaler and spacer. If you are using different kinds of inhalers, use your quick relief medicine to open the airways 10-15 minutes before using a steroid if instructed to do so by your health care provider. If you are unsure which inhalers to use and the order of using them, ask your health care provider, nurse, or respiratory therapist. If you are using a steroid inhaler, always rinse your mouth with water after your last puff, then gargle and spit out the water. Do not swallow the water. AVOID:  Inhaling before or after starting the spray of medicine. It takes practice to coordinate your breathing with triggering the spray.  Inhaling through the nose (rather than the mouth) when triggering the spray. HOW TO DETERMINE IF YOUR INHALER IS FULL OR NEARLY EMPTY You cannot know when an inhaler is empty by shaking it. A few inhalers are now being made with dose counters. Ask your health care provider for a prescription that has a dose counter if you feel you need that extra help. If your inhaler does not have a counter, ask your health care provider to help you determine the date you need to refill your inhaler. Write the  refill date on a calendar or your inhaler canister. Refill your inhaler 7-10 days before it runs out. Be sure to keep an adequate supply of medicine. This includes making sure it is not expired, and that you  have a spare inhaler.  SEEK MEDICAL CARE IF:   Your symptoms are only partially relieved with your inhaler.  You are having trouble using your inhaler.  You have some increase in phlegm. SEEK IMMEDIATE MEDICAL CARE IF:   You feel little or no relief with your inhalers. You are still wheezing and are feeling shortness of breath or tightness in your chest or both.  You have dizziness, headaches, or a fast heart rate.  You have chills, fever, or night sweats.  You have a noticeable increase in phlegm production, or there is blood in the phlegm. MAKE SURE YOU:   Understand these instructions.  Will watch your condition.  Will get help right away if you are not doing well or get worse. Document Released: 04/13/2000 Document Revised: 02/04/2013 Document Reviewed: 11/13/2012 [facility] Patient Information 2015 [place], [place]. This information is not intended to replace advice given to you by your health care provider. Make sure you discuss any questions you have with your health care provider.

## 2014-03-04 ENCOUNTER — Telehealth: Payer: Self-pay

## 2014-03-04 NOTE — Telephone Encounter (Signed)
LVM for pt to call back.   RE: scheduling an AWV with PCP or NP if available.

## 2014-04-01 ENCOUNTER — Ambulatory Visit (INDEPENDENT_AMBULATORY_CARE_PROVIDER_SITE_OTHER): Payer: Medicare Other | Admitting: Internal Medicine

## 2014-04-01 ENCOUNTER — Encounter: Payer: Self-pay | Admitting: Internal Medicine

## 2014-04-01 VITALS — BP 140/84 | HR 79 | Temp 97.9°F | Ht 62.0 in | Wt 122.0 lb

## 2014-04-01 DIAGNOSIS — B370 Candidal stomatitis: Secondary | ICD-10-CM

## 2014-04-01 DIAGNOSIS — Z0000 Encounter for general adult medical examination without abnormal findings: Secondary | ICD-10-CM

## 2014-04-01 DIAGNOSIS — J4540 Moderate persistent asthma, uncomplicated: Secondary | ICD-10-CM

## 2014-04-01 DIAGNOSIS — J471 Bronchiectasis with (acute) exacerbation: Secondary | ICD-10-CM

## 2014-04-01 MED ORDER — PROMETHAZINE-CODEINE 6.25-10 MG/5ML PO SYRP: 5.0000 mL | ORAL_SOLUTION | Freq: Four times a day (QID) | ORAL | Status: DC | PRN

## 2014-04-01 MED ORDER — NYSTATIN 100000 UNIT/ML MT SUSP: 500000.0000 [IU] | Freq: Four times a day (QID) | OROMUCOSAL | Status: DC

## 2014-04-01 NOTE — Assessment & Plan Note (Signed)
Nystatin po prn

## 2014-04-01 NOTE — Patient Instructions (Signed)
Preventive Care for Adults A healthy lifestyle and preventive care can promote health and wellness. Preventive health guidelines for women include the following key practices.  A routine yearly physical is a good way to check with your health care provider about your health and preventive screening. It is a chance to share any concerns and updates on your health and to receive a thorough exam.  Visit your dentist for a routine exam and preventive care every 6 months. Brush your teeth twice a day and floss once a day. Good oral hygiene prevents tooth decay and gum disease.  The frequency of eye exams is based on your age, health, family medical history, use of contact lenses, and other factors. Follow your health care provider's recommendations for frequency of eye exams.  Eat a healthy diet. Foods like vegetables, fruits, whole grains, low-fat dairy products, and lean protein foods contain the nutrients you need without too many calories. Decrease your intake of foods high in solid fats, added sugars, and salt. Eat the right amount of calories for you.Get information about a proper diet from your health care provider, if necessary.  Regular physical exercise is one of the most important things you can do for your health. Most adults should get at least 150 minutes of moderate-intensity exercise (any activity that increases your heart rate and causes you to sweat) each week. In addition, most adults need muscle-strengthening exercises on 2 or more days a week.  Maintain a healthy weight. The body mass index (BMI) is a screening tool to identify possible weight problems. It provides an estimate of body fat based on height and weight. Your health care provider can find your BMI and can help you achieve or maintain a healthy weight.For adults 20 years and older:  A BMI below 18.5 is considered underweight.  A BMI of 18.5 to 24.9 is normal.  A BMI of 25 to 29.9 is considered overweight.  A BMI of  30 and above is considered obese.  Maintain normal blood lipids and cholesterol levels by exercising and minimizing your intake of saturated fat. Eat a balanced diet with plenty of fruit and vegetables. Blood tests for lipids and cholesterol should begin at age 76 and be repeated every 5 years. If your lipid or cholesterol levels are high, you are over 50, or you are at high risk for heart disease, you may need your cholesterol levels checked more frequently.Ongoing high lipid and cholesterol levels should be treated with medicines if diet and exercise are not working.  If you smoke, find out from your health care provider how to quit. If you do not use tobacco, do not start.  Lung cancer screening is recommended for adults aged 22-80 years who are at high risk for developing lung cancer because of a history of smoking. A yearly low-dose CT scan of the lungs is recommended for people who have at least a 30-pack-year history of smoking and are a current smoker or have quit within the past 15 years. A pack year of smoking is smoking an average of 1 pack of cigarettes a day for 1 year (for example: 1 pack a day for 30 years or 2 packs a day for 15 years). Yearly screening should continue until the smoker has stopped smoking for at least 15 years. Yearly screening should be stopped for people who develop a health problem that would prevent them from having lung cancer treatment.  If you are pregnant, do not drink alcohol. If you are breastfeeding,  be very cautious about drinking alcohol. If you are not pregnant and choose to drink alcohol, do not have more than 1 drink per day. One drink is considered to be 12 ounces (355 mL) of beer, 5 ounces (148 mL) of wine, or 1.5 ounces (44 mL) of liquor.  Avoid use of street drugs. Do not share needles with anyone. Ask for help if you need support or instructions about stopping the use of drugs.  High blood pressure causes heart disease and increases the risk of  stroke. Your blood pressure should be checked at least every 1 to 2 years. Ongoing high blood pressure should be treated with medicines if weight loss and exercise do not work.  If you are 75-52 years old, ask your health care provider if you should take aspirin to prevent strokes.  Diabetes screening involves taking a blood sample to check your fasting blood sugar level. This should be done once every 3 years, after age 15, if you are within normal weight and without risk factors for diabetes. Testing should be considered at a younger age or be carried out more frequently if you are overweight and have at least 1 risk factor for diabetes.  Breast cancer screening is essential preventive care for women. You should practice "breast self-awareness." This means understanding the normal appearance and feel of your breasts and may include breast self-examination. Any changes detected, no matter how small, should be reported to a health care provider. Women in their 58s and 30s should have a clinical breast exam (CBE) by a health care provider as part of a regular health exam every 1 to 3 years. After age 16, women should have a CBE every year. Starting at age 53, women should consider having a mammogram (breast X-ray test) every year. Women who have a family history of breast cancer should talk to their health care provider about genetic screening. Women at a high risk of breast cancer should talk to their health care providers about having an MRI and a mammogram every year.  Breast cancer gene (BRCA)-related cancer risk assessment is recommended for women who have family members with BRCA-related cancers. BRCA-related cancers include breast, ovarian, tubal, and peritoneal cancers. Having family members with these cancers may be associated with an increased risk for harmful changes (mutations) in the breast cancer genes BRCA1 and BRCA2. Results of the assessment will determine the need for genetic counseling and  BRCA1 and BRCA2 testing.  Routine pelvic exams to screen for cancer are no longer recommended for nonpregnant women who are considered low risk for cancer of the pelvic organs (ovaries, uterus, and vagina) and who do not have symptoms. Ask your health care provider if a screening pelvic exam is right for you.  If you have had past treatment for cervical cancer or a condition that could lead to cancer, you need Pap tests and screening for cancer for at least 20 years after your treatment. If Pap tests have been discontinued, your risk factors (such as having a new sexual partner) need to be reassessed to determine if screening should be resumed. Some women have medical problems that increase the chance of getting cervical cancer. In these cases, your health care provider may recommend more frequent screening and Pap tests.  The HPV test is an additional test that may be used for cervical cancer screening. The HPV test looks for the virus that can cause the cell changes on the cervix. The cells collected during the Pap test can be  tested for HPV. The HPV test could be used to screen women aged 30 years and older, and should be used in women of any age who have unclear Pap test results. After the age of 30, women should have HPV testing at the same frequency as a Pap test.  Colorectal cancer can be detected and often prevented. Most routine colorectal cancer screening begins at the age of 50 years and continues through age 75 years. However, your health care provider may recommend screening at an earlier age if you have risk factors for colon cancer. On a yearly basis, your health care provider may provide home test kits to check for hidden blood in the stool. Use of a small camera at the end of a tube, to directly examine the colon (sigmoidoscopy or colonoscopy), can detect the earliest forms of colorectal cancer. Talk to your health care provider about this at age 50, when routine screening begins. Direct  exam of the colon should be repeated every 5-10 years through age 75 years, unless early forms of pre-cancerous polyps or small growths are found.  People who are at an increased risk for hepatitis B should be screened for this virus. You are considered at high risk for hepatitis B if:  You were born in a country where hepatitis B occurs often. Talk with your health care provider about which countries are considered high risk.  Your parents were born in a high-risk country and you have not received a shot to protect against hepatitis B (hepatitis B vaccine).  You have HIV or AIDS.  You use needles to inject street drugs.  You live with, or have sex with, someone who has hepatitis B.  You get hemodialysis treatment.  You take certain medicines for conditions like cancer, organ transplantation, and autoimmune conditions.  Hepatitis C blood testing is recommended for all people born from 1945 through 1965 and any individual with known risks for hepatitis C.  Practice safe sex. Use condoms and avoid high-risk sexual practices to reduce the spread of sexually transmitted infections (STIs). STIs include gonorrhea, chlamydia, syphilis, trichomonas, herpes, HPV, and human immunodeficiency virus (HIV). Herpes, HIV, and HPV are viral illnesses that have no cure. They can result in disability, cancer, and death.  You should be screened for sexually transmitted illnesses (STIs) including gonorrhea and chlamydia if:  You are sexually active and are younger than 24 years.  You are older than 24 years and your health care provider tells you that you are at risk for this type of infection.  Your sexual activity has changed since you were last screened and you are at an increased risk for chlamydia or gonorrhea. Ask your health care provider if you are at risk.  If you are at risk of being infected with HIV, it is recommended that you take a prescription medicine daily to prevent HIV infection. This is  called preexposure prophylaxis (PrEP). You are considered at risk if:  You are a heterosexual woman, are sexually active, and are at increased risk for HIV infection.  You take drugs by injection.  You are sexually active with a partner who has HIV.  Talk with your health care provider about whether you are at high risk of being infected with HIV. If you choose to begin PrEP, you should first be tested for HIV. You should then be tested every 3 months for as long as you are taking PrEP.  Osteoporosis is a disease in which the bones lose minerals and strength   with aging. This can result in serious bone fractures or breaks. The risk of osteoporosis can be identified using a bone density scan. Women ages 65 years and over and women at risk for fractures or osteoporosis should discuss screening with their health care providers. Ask your health care provider whether you should take a calcium supplement or vitamin D to reduce the rate of osteoporosis.  Menopause can be associated with physical symptoms and risks. Hormone replacement therapy is available to decrease symptoms and risks. You should talk to your health care provider about whether hormone replacement therapy is right for you.  Use sunscreen. Apply sunscreen liberally and repeatedly throughout the day. You should seek shade when your shadow is shorter than you. Protect yourself by wearing long sleeves, pants, a wide-brimmed hat, and sunglasses year round, whenever you are outdoors.  Once a month, do a whole body skin exam, using a mirror to look at the skin on your back. Tell your health care provider of new moles, moles that have irregular borders, moles that are larger than a pencil eraser, or moles that have changed in shape or color.  Stay current with required vaccines (immunizations).  Influenza vaccine. All adults should be immunized every year.  Tetanus, diphtheria, and acellular pertussis (Td, Tdap) vaccine. Pregnant women should  receive 1 dose of Tdap vaccine during each pregnancy. The dose should be obtained regardless of the length of time since the last dose. Immunization is preferred during the 27th-36th week of gestation. An adult who has not previously received Tdap or who does not know her vaccine status should receive 1 dose of Tdap. This initial dose should be followed by tetanus and diphtheria toxoids (Td) booster doses every 10 years. Adults with an unknown or incomplete history of completing a 3-dose immunization series with Td-containing vaccines should begin or complete a primary immunization series including a Tdap dose. Adults should receive a Td booster every 10 years.  Varicella vaccine. An adult without evidence of immunity to varicella should receive 2 doses or a second dose if she has previously received 1 dose. Pregnant females who do not have evidence of immunity should receive the first dose after pregnancy. This first dose should be obtained before leaving the health care facility. The second dose should be obtained 4-8 weeks after the first dose.  Human papillomavirus (HPV) vaccine. Females aged 13-26 years who have not received the vaccine previously should obtain the 3-dose series. The vaccine is not recommended for use in pregnant females. However, pregnancy testing is not needed before receiving a dose. If a female is found to be pregnant after receiving a dose, no treatment is needed. In that case, the remaining doses should be delayed until after the pregnancy. Immunization is recommended for any person with an immunocompromised condition through the age of 26 years if she did not get any or all doses earlier. During the 3-dose series, the second dose should be obtained 4-8 weeks after the first dose. The third dose should be obtained 24 weeks after the first dose and 16 weeks after the second dose.  Zoster vaccine. One dose is recommended for adults aged 60 years or older unless certain conditions are  present.  Measles, mumps, and rubella (MMR) vaccine. Adults born before 1957 generally are considered immune to measles and mumps. Adults born in 1957 or later should have 1 or more doses of MMR vaccine unless there is a contraindication to the vaccine or there is laboratory evidence of immunity to   each of the three diseases. A routine second dose of MMR vaccine should be obtained at least 28 days after the first dose for students attending postsecondary schools, health care workers, or international travelers. People who received inactivated measles vaccine or an unknown type of measles vaccine during 1963-1967 should receive 2 doses of MMR vaccine. People who received inactivated mumps vaccine or an unknown type of mumps vaccine before 1979 and are at high risk for mumps infection should consider immunization with 2 doses of MMR vaccine. For females of childbearing age, rubella immunity should be determined. If there is no evidence of immunity, females who are not pregnant should be vaccinated. If there is no evidence of immunity, females who are pregnant should delay immunization until after pregnancy. Unvaccinated health care workers born before 1957 who lack laboratory evidence of measles, mumps, or rubella immunity or laboratory confirmation of disease should consider measles and mumps immunization with 2 doses of MMR vaccine or rubella immunization with 1 dose of MMR vaccine.  Pneumococcal 13-valent conjugate (PCV13) vaccine. When indicated, a person who is uncertain of her immunization history and has no record of immunization should receive the PCV13 vaccine. An adult aged 19 years or older who has certain medical conditions and has not been previously immunized should receive 1 dose of PCV13 vaccine. This PCV13 should be followed with a dose of pneumococcal polysaccharide (PPSV23) vaccine. The PPSV23 vaccine dose should be obtained at least 8 weeks after the dose of PCV13 vaccine. An adult aged 19  years or older who has certain medical conditions and previously received 1 or more doses of PPSV23 vaccine should receive 1 dose of PCV13. The PCV13 vaccine dose should be obtained 1 or more years after the last PPSV23 vaccine dose.  Pneumococcal polysaccharide (PPSV23) vaccine. When PCV13 is also indicated, PCV13 should be obtained first. All adults aged 65 years and older should be immunized. An adult younger than age 65 years who has certain medical conditions should be immunized. Any person who resides in a nursing home or long-term care facility should be immunized. An adult smoker should be immunized. People with an immunocompromised condition and certain other conditions should receive both PCV13 and PPSV23 vaccines. People with human immunodeficiency virus (HIV) infection should be immunized as soon as possible after diagnosis. Immunization during chemotherapy or radiation therapy should be avoided. Routine use of PPSV23 vaccine is not recommended for American Indians, [place] Natives, or people younger than 65 years unless there are medical conditions that require PPSV23 vaccine. When indicated, people who have unknown immunization and have no record of immunization should receive PPSV23 vaccine. One-time revaccination 5 years after the first dose of PPSV23 is recommended for people aged 19-64 years who have chronic kidney failure, nephrotic syndrome, asplenia, or immunocompromised conditions. People who received 1-2 doses of PPSV23 before age 65 years should receive another dose of PPSV23 vaccine at age 65 years or later if at least 5 years have passed since the previous dose. Doses of PPSV23 are not needed for people immunized with PPSV23 at or after age 65 years.  Meningococcal vaccine. Adults with asplenia or persistent complement component deficiencies should receive 2 doses of quadrivalent meningococcal conjugate (MenACWY-D) vaccine. The doses should be obtained at least 2 months apart.  Microbiologists working with certain meningococcal bacteria, military recruits, people at risk during an outbreak, and people who travel to or live in countries with a high rate of meningitis should be immunized. A first-year college student up through age   21 years who is living in a residence hall should receive a dose if she did not receive a dose on or after her 16th birthday. Adults who have certain high-risk conditions should receive one or more doses of vaccine.  Hepatitis A vaccine. Adults who wish to be protected from this disease, have certain high-risk conditions, work with hepatitis A-infected animals, work in hepatitis A research labs, or travel to or work in countries with a high rate of hepatitis A should be immunized. Adults who were previously unvaccinated and who anticipate close contact with an international adoptee during the first 60 days after arrival in the [place] from a country with a high rate of hepatitis A should be immunized.  Hepatitis B vaccine. Adults who wish to be protected from this disease, have certain high-risk conditions, may be exposed to blood or other infectious body fluids, are household contacts or sex partners of hepatitis B positive people, are clients or workers in certain care facilities, or travel to or work in countries with a high rate of hepatitis B should be immunized.  Haemophilus influenzae type b (Hib) vaccine. A previously unvaccinated person with asplenia or sickle cell disease or having a scheduled splenectomy should receive 1 dose of Hib vaccine. Regardless of previous immunization, a recipient of a hematopoietic stem cell transplant should receive a 3-dose series 6-12 months after her successful transplant. Hib vaccine is not recommended for adults with HIV infection. Preventive Services / Frequency Ages 64 to 68 years  Blood pressure check.** / Every 1 to 2 years.  Lipid and cholesterol check.** / Every 5 years beginning at age  22.  Clinical breast exam.** / Every 3 years for women in their 88s and 53s.  BRCA-related cancer risk assessment.** / For women who have family members with a BRCA-related cancer (breast, ovarian, tubal, or peritoneal cancers).  Pap test.** / Every 2 years from ages 90 through 51. Every 3 years starting at age 21 through age 56 or 3 with a history of 3 consecutive normal Pap tests.  HPV screening.** / Every 3 years from ages 24 through ages 1 to 46 with a history of 3 consecutive normal Pap tests.  Hepatitis C blood test.** / For any individual with known risks for hepatitis C.  Skin self-exam. / Monthly.  Influenza vaccine. / Every year.  Tetanus, diphtheria, and acellular pertussis (Tdap, Td) vaccine.** / Consult your health care provider. Pregnant women should receive 1 dose of Tdap vaccine during each pregnancy. 1 dose of Td every 10 years.  Varicella vaccine.** / Consult your health care provider. Pregnant females who do not have evidence of immunity should receive the first dose after pregnancy.  HPV vaccine. / 3 doses over 6 months, if 72 and younger. The vaccine is not recommended for use in pregnant females. However, pregnancy testing is not needed before receiving a dose.  Measles, mumps, rubella (MMR) vaccine.** / You need at least 1 dose of MMR if you were born in 1957 or later. You may also need a 2nd dose. For females of childbearing age, rubella immunity should be determined. If there is no evidence of immunity, females who are not pregnant should be vaccinated. If there is no evidence of immunity, females who are pregnant should delay immunization until after pregnancy.  Pneumococcal 13-valent conjugate (PCV13) vaccine.** / Consult your health care provider.  Pneumococcal polysaccharide (PPSV23) vaccine.** / 1 to 2 doses if you smoke cigarettes or if you have certain conditions.  Meningococcal vaccine.** /  1 dose if you are age 19 to 21 years and a first-year college  student living in a residence hall, or have one of several medical conditions, you need to get vaccinated against meningococcal disease. You may also need additional booster doses.  Hepatitis A vaccine.** / Consult your health care provider.  Hepatitis B vaccine.** / Consult your health care provider.  Haemophilus influenzae type b (Hib) vaccine.** / Consult your health care provider. Ages 40 to 64 years  Blood pressure check.** / Every 1 to 2 years.  Lipid and cholesterol check.** / Every 5 years beginning at age 20 years.  Lung cancer screening. / Every year if you are aged 55-80 years and have a 30-pack-year history of smoking and currently smoke or have quit within the past 15 years. Yearly screening is stopped once you have quit smoking for at least 15 years or develop a health problem that would prevent you from having lung cancer treatment.  Clinical breast exam.** / Every year after age 40 years.  BRCA-related cancer risk assessment.** / For women who have family members with a BRCA-related cancer (breast, ovarian, tubal, or peritoneal cancers).  Mammogram.** / Every year beginning at age 40 years and continuing for as long as you are in good health. Consult with your health care provider.  Pap test.** / Every 3 years starting at age 30 years through age 65 or 70 years with a history of 3 consecutive normal Pap tests.  HPV screening.** / Every 3 years from ages 30 years through ages 65 to 70 years with a history of 3 consecutive normal Pap tests.  Fecal occult blood test (FOBT) of stool. / Every year beginning at age 50 years and continuing until age 75 years. You may not need to do this test if you get a colonoscopy every 10 years.  Flexible sigmoidoscopy or colonoscopy.** / Every 5 years for a flexible sigmoidoscopy or every 10 years for a colonoscopy beginning at age 50 years and continuing until age 75 years.  Hepatitis C blood test.** / For all people born from 1945 through  1965 and any individual with known risks for hepatitis C.  Skin self-exam. / Monthly.  Influenza vaccine. / Every year.  Tetanus, diphtheria, and acellular pertussis (Tdap/Td) vaccine.** / Consult your health care provider. Pregnant women should receive 1 dose of Tdap vaccine during each pregnancy. 1 dose of Td every 10 years.  Varicella vaccine.** / Consult your health care provider. Pregnant females who do not have evidence of immunity should receive the first dose after pregnancy.  Zoster vaccine.** / 1 dose for adults aged 60 years or older.  Measles, mumps, rubella (MMR) vaccine.** / You need at least 1 dose of MMR if you were born in 1957 or later. You may also need a 2nd dose. For females of childbearing age, rubella immunity should be determined. If there is no evidence of immunity, females who are not pregnant should be vaccinated. If there is no evidence of immunity, females who are pregnant should delay immunization until after pregnancy.  Pneumococcal 13-valent conjugate (PCV13) vaccine.** / Consult your health care provider.  Pneumococcal polysaccharide (PPSV23) vaccine.** / 1 to 2 doses if you smoke cigarettes or if you have certain conditions.  Meningococcal vaccine.** / Consult your health care provider.  Hepatitis A vaccine.** / Consult your health care provider.  Hepatitis B vaccine.** / Consult your health care provider.  Haemophilus influenzae type b (Hib) vaccine.** / Consult your health care provider. Ages 65   years and over  Blood pressure check.** / Every 1 to 2 years.  Lipid and cholesterol check.** / Every 5 years beginning at age 22 years.  Lung cancer screening. / Every year if you are aged 73-80 years and have a 30-pack-year history of smoking and currently smoke or have quit within the past 15 years. Yearly screening is stopped once you have quit smoking for at least 15 years or develop a health problem that would prevent you from having lung cancer  treatment.  Clinical breast exam.** / Every year after age 4 years.  BRCA-related cancer risk assessment.** / For women who have family members with a BRCA-related cancer (breast, ovarian, tubal, or peritoneal cancers).  Mammogram.** / Every year beginning at age 40 years and continuing for as long as you are in good health. Consult with your health care provider.  Pap test.** / Every 3 years starting at age 9 years through age 34 or 91 years with 3 consecutive normal Pap tests. Testing can be stopped between 65 and 70 years with 3 consecutive normal Pap tests and no abnormal Pap or HPV tests in the past 10 years.  HPV screening.** / Every 3 years from ages 57 years through ages 64 or 45 years with a history of 3 consecutive normal Pap tests. Testing can be stopped between 65 and 70 years with 3 consecutive normal Pap tests and no abnormal Pap or HPV tests in the past 10 years.  Fecal occult blood test (FOBT) of stool. / Every year beginning at age 15 years and continuing until age 17 years. You may not need to do this test if you get a colonoscopy every 10 years.  Flexible sigmoidoscopy or colonoscopy.** / Every 5 years for a flexible sigmoidoscopy or every 10 years for a colonoscopy beginning at age 86 years and continuing until age 71 years.  Hepatitis C blood test.** / For all people born from [birth year] through 1965 and any individual with known risks for hepatitis C.  Osteoporosis screening.** / A one-time screening for women ages 83 years and over and women at risk for fractures or osteoporosis.  Skin self-exam. / Monthly.  Influenza vaccine. / Every year.  Tetanus, diphtheria, and acellular pertussis (Tdap/Td) vaccine.** / 1 dose of Td every 10 years.  Varicella vaccine.** / Consult your health care provider.  Zoster vaccine.** / 1 dose for adults aged 61 years or older.  Pneumococcal 13-valent conjugate (PCV13) vaccine.** / Consult your health care provider.  Pneumococcal  polysaccharide (PPSV23) vaccine.** / 1 dose for all adults aged 28 years and older.  Meningococcal vaccine.** / Consult your health care provider.  Hepatitis A vaccine.** / Consult your health care provider.  Hepatitis B vaccine.** / Consult your health care provider.  Haemophilus influenzae type b (Hib) vaccine.** / Consult your health care provider. ** Family history and personal history of risk and conditions may change your health care provider's recommendations. Document Released: 06/12/2001 Document Revised: 08/31/2013 Document Reviewed: 09/11/2010 [facility] Patient Information 2015 [place], [place]. This information is not intended to replace advice given to you by your health care provider. Make sure you discuss any questions you have with your health care provider.

## 2014-04-01 NOTE — Assessment & Plan Note (Signed)
Here for medicare wellness/physical  Diet: heart healthy  Physical activity: not sedentary  Depression/mood screen: negative now Hearing: intact to whispered voice  Visual acuity: grossly normal, performs annual eye exam  ADLs: capable w/O2 Fall risk: none  Home safety: good  Cognitive evaluation: intact to orientation, naming, recall and repetition  EOL planning: adv directives, full code/ I agree  I have personally reviewed and have noted  1. The patient's medical and social history  2. Their use of alcohol, tobacco or illicit drugs  3. Their current medications and supplements  4. The patient's functional ability including ADL's, fall risks, home safety risks and hearing or visual impairment.  5. Diet and physical activities  6. Evidence for depression or mood disorders    Today patient counseled on age appropriate routine health concerns for screening and prevention, each reviewed and up to date or declined. Immunizations reviewed and up to date or declined. Labs ordered and reviewed. Risk factors for depression reviewed and negative. Hearing function and visual acuity are intact. ADLs screened and addressed as needed. Functional ability and level of safety reviewed and appropriate. Education, counseling and referrals performed based on assessed risks today. Patient provided with a copy of personalized plan for preventive services.

## 2014-04-01 NOTE — Assessment & Plan Note (Signed)
Prom-cod syr prn 

## 2014-04-01 NOTE — Assessment & Plan Note (Signed)
Continue with current prescription therapy as reflected on the Med list.  

## 2014-04-01 NOTE — Progress Notes (Signed)
Pre visit review using our clinic review tool, if applicable. No additional management support is needed unless otherwise documented below in the visit note. 

## 2014-04-01 NOTE — Progress Notes (Signed)
   Subjective:  The patient is here for a wellness exam. The patient has been doing well overall without major physical or psychological issues going on lately.  On O2 now  HPI  The patient presents for a follow-up of  Chronic asthma/COPD. C/o thrush at times  BP Readings from Last 3 Encounters:  04/01/14 140/84  02/18/14 130/75  07/28/13 123/66   Wt Readings from Last 3 Encounters:  04/01/14 122 lb (55.339 kg)  02/18/14 120 lb (54.432 kg)  07/25/13 110 lb 4.8 oz (50.032 kg)      Review of Systems  Constitutional: Positive for fatigue. Negative for activity change, appetite change and unexpected weight change.  HENT: Negative for mouth sores and sinus pressure.   Eyes: Negative for visual disturbance.  Respiratory: Positive for choking. Negative for chest tightness and wheezing.   Cardiovascular: Negative for chest pain, palpitations and leg swelling.  Genitourinary: Negative for urgency, frequency, difficulty urinating and vaginal pain.  Musculoskeletal: Negative for back pain and gait problem.  Skin: Negative for pallor.  Neurological: Negative for dizziness, tremors, weakness and numbness.  Psychiatric/Behavioral: Negative for confusion and sleep disturbance. The patient is not nervous/anxious.        Objective:   Physical Exam  Constitutional: She appears well-developed. No distress.  HENT:  Head: Normocephalic.  Right Ear: External ear normal.  Left Ear: External ear normal.  Nose: Nose normal.  Mouth/Throat: Oropharynx is clear and moist.  Eyes: Conjunctivae are normal. Pupils are equal, round, and reactive to light. Right eye exhibits no discharge. Left eye exhibits no discharge.  Neck: Normal range of motion. Neck supple. No JVD present. No tracheal deviation present. No thyromegaly present.  Cardiovascular: Normal rate, regular rhythm and normal heart sounds.   Pulmonary/Chest: No stridor. No respiratory distress. She has no wheezes.  Abdominal: Soft. Bowel  sounds are normal. She exhibits no distension and no mass. There is no tenderness. There is no rebound and no guarding.  Musculoskeletal: She exhibits no edema or tenderness.  Lymphadenopathy:    She has no cervical adenopathy.  Neurological: She displays normal reflexes. No cranial nerve deficit. She exhibits normal muscle tone. Coordination normal.  Skin: No rash noted. No erythema.  Psychiatric: She has a normal mood and affect. Her behavior is normal. Judgment and thought content normal.     Lab Results  Component Value Date   WBC 6.7 02/18/2014   HGB 14.9 02/18/2014   HCT 45.7 02/18/2014   PLT 295 02/18/2014   GLUCOSE 98 02/18/2014   CHOL 205* 01/19/2010   TRIG 83.0 01/19/2010   HDL 49.00 01/19/2010   LDLDIRECT 138.6 01/19/2010   LDLCALC 97 11/08/2008   ALT 12 07/26/2013   AST 20 07/26/2013   NA 139 02/18/2014   K 4.4 02/18/2014   CL 99 02/18/2014   CREATININE 0.84 02/18/2014   BUN 16 02/18/2014   CO2 31 02/18/2014   TSH 1.01 01/19/2010   INR 1.02 01/17/2011   HGBA1C * 10/31/2008    6.2 (NOTE) The ADA recommends the following therapeutic goal for glycemic control related to Hgb A1c measurement: Goal of therapy: <6.5 Hgb A1c  Reference: American Diabetes Association: Clinical Practice Recommendations 2010, Diabetes Care, 2010, 33: (Suppl  1).          Assessment & Plan:

## 2014-04-19 ENCOUNTER — Other Ambulatory Visit (INDEPENDENT_AMBULATORY_CARE_PROVIDER_SITE_OTHER): Payer: Medicare Other

## 2014-04-19 DIAGNOSIS — B370 Candidal stomatitis: Secondary | ICD-10-CM

## 2014-04-19 DIAGNOSIS — Z0000 Encounter for general adult medical examination without abnormal findings: Secondary | ICD-10-CM

## 2014-04-19 DIAGNOSIS — J471 Bronchiectasis with (acute) exacerbation: Secondary | ICD-10-CM

## 2014-04-19 DIAGNOSIS — I10 Essential (primary) hypertension: Secondary | ICD-10-CM

## 2014-04-19 DIAGNOSIS — J4540 Moderate persistent asthma, uncomplicated: Secondary | ICD-10-CM

## 2014-04-19 LAB — CBC WITH DIFFERENTIAL/PLATELET
Basophils Absolute: 0.4 10*3/uL — ABNORMAL HIGH (ref 0.0–0.1)
Basophils Relative: 5.5 % — ABNORMAL HIGH (ref 0.0–3.0)
EOS ABS: 0.9 10*3/uL — AB (ref 0.0–0.7)
EOS PCT: 13.4 % — AB (ref 0.0–5.0)
HCT: 45.3 % (ref 36.0–46.0)
Hemoglobin: 14.8 g/dL (ref 12.0–15.0)
Lymphocytes Relative: 43.5 % (ref 12.0–46.0)
Lymphs Abs: 2.9 10*3/uL (ref 0.7–4.0)
MCHC: 32.6 g/dL (ref 30.0–36.0)
MCV: 89.5 fl (ref 78.0–100.0)
Monocytes Absolute: 0.6 10*3/uL (ref 0.1–1.0)
Monocytes Relative: 8.7 % (ref 3.0–12.0)
NEUTROS PCT: 28.9 % — AB (ref 43.0–77.0)
Neutro Abs: 2 10*3/uL (ref 1.4–7.7)
Platelets: 315 10*3/uL (ref 150.0–400.0)
RBC: 5.07 Mil/uL (ref 3.87–5.11)
RDW: 15.9 % — ABNORMAL HIGH (ref 11.5–15.5)
WBC: 6.8 10*3/uL (ref 4.0–10.5)

## 2014-04-19 LAB — HEPATIC FUNCTION PANEL
ALK PHOS: 71 U/L (ref 39–117)
ALT: 12 U/L (ref 0–35)
AST: 22 U/L (ref 0–37)
Albumin: 4.3 g/dL (ref 3.5–5.2)
BILIRUBIN DIRECT: 0.1 mg/dL (ref 0.0–0.3)
Total Bilirubin: 0.9 mg/dL (ref 0.2–1.2)
Total Protein: 7.8 g/dL (ref 6.0–8.3)

## 2014-04-19 LAB — URINALYSIS, ROUTINE W REFLEX MICROSCOPIC
Bilirubin Urine: NEGATIVE
KETONES UR: NEGATIVE
Nitrite: NEGATIVE
Specific Gravity, Urine: 1.02 (ref 1.000–1.030)
Total Protein, Urine: NEGATIVE
URINE GLUCOSE: NEGATIVE
UROBILINOGEN UA: 0.2 (ref 0.0–1.0)
pH: 6.5 (ref 5.0–8.0)

## 2014-04-19 LAB — LIPID PANEL
CHOL/HDL RATIO: 4
Cholesterol: 227 mg/dL — ABNORMAL HIGH (ref 0–200)
HDL: 58.5 mg/dL (ref >39.00)
LDL Cholesterol: 156 mg/dL — ABNORMAL HIGH (ref 0–99)
NonHDL: 168.5
Triglycerides: 64 mg/dL (ref 0.0–149.0)
VLDL: 12.8 mg/dL (ref 0.0–40.0)

## 2014-04-19 LAB — BASIC METABOLIC PANEL
BUN: 16 mg/dL (ref 6–23)
CALCIUM: 9.6 mg/dL (ref 8.4–10.5)
CO2: 30 mEq/L (ref 19–32)
Chloride: 100 mEq/L (ref 96–112)
Creatinine, Ser: 0.8 mg/dL (ref 0.4–1.2)
GFR: 93.11 mL/min (ref >60.00)
Glucose, Bld: 93 mg/dL (ref 70–99)
Potassium: 4.3 mEq/L (ref 3.5–5.1)
SODIUM: 139 meq/L (ref 135–145)

## 2014-04-20 LAB — TSH: TSH: 1.04 u[IU]/mL (ref 0.35–4.50)

## 2014-05-10 ENCOUNTER — Encounter: Payer: Self-pay | Admitting: Internal Medicine

## 2014-05-12 ENCOUNTER — Encounter: Payer: Self-pay | Admitting: Internal Medicine

## 2014-07-01 ENCOUNTER — Encounter: Payer: Self-pay | Admitting: Internal Medicine

## 2014-07-06 ENCOUNTER — Telehealth: Payer: Self-pay | Admitting: Internal Medicine

## 2014-07-06 MED ORDER — AZITHROMYCIN 250 MG PO TABS: ORAL_TABLET | ORAL | Status: DC

## 2014-07-06 NOTE — Telephone Encounter (Signed)
Per CY---  zpak #1  Take as directed Pt will need appt with TP  Called and spoke with pt and she is aware of CY recs.  Nothing further is needed.

## 2014-07-06 NOTE — Telephone Encounter (Signed)
Pt c/o increased SOB, cough and congestion with clear-brown mucus production.  Pt has a very raspy cough and upper airway wheezing could be heard over the phone.  Pt has been using nebulizer and Symbicort as directed.  Last seen 12/2012.  Pt requesting an antibiotic be sent to pharmacy. Made aware that she may need an OV with CY or TP since she has not been seen since 2014 Allergies  Allergen Reactions  . Penicillins Swelling    Throat swelling  . Shellfish Allergy Anaphylaxis  . Sulfonamide Derivatives Swelling    Throat swelling  . Aspirin Other (See Comments)    States stomach bubbles, becomes gaseous and irritated  . Bee Venom   . Clindamycin     REACTION: Neck, tongue swelling, SOB \\T \ rash  . Fluticasone-Salmeterol     REACTION: hoarseness  . Fruit & Vegetable Daily [Nutritional Supplements]     Tongue swelling, vomiting  . Latex Itching and Swelling  . Montelukast Sodium     REACTION: hallucination   Please advise Dr Annamaria Boots. Thanks.

## 2014-07-08 ENCOUNTER — Ambulatory Visit (INDEPENDENT_AMBULATORY_CARE_PROVIDER_SITE_OTHER)
Admission: RE | Admit: 2014-07-08 | Discharge: 2014-07-08 | Disposition: A | Discharge status: Home / Self Care | Payer: Commercial Managed Care - HMO | Source: Ambulatory Visit | Attending: Internal Medicine | Admitting: Internal Medicine

## 2014-07-08 ENCOUNTER — Other Ambulatory Visit: Payer: Self-pay | Admitting: Internal Medicine

## 2014-07-08 ENCOUNTER — Ambulatory Visit (INDEPENDENT_AMBULATORY_CARE_PROVIDER_SITE_OTHER): Payer: Commercial Managed Care - HMO | Admitting: Internal Medicine

## 2014-07-08 ENCOUNTER — Encounter: Payer: Self-pay | Admitting: Internal Medicine

## 2014-07-08 VITALS — BP 126/74 | HR 89 | Ht 62.0 in | Wt 116.2 lb

## 2014-07-08 DIAGNOSIS — J471 Bronchiectasis with (acute) exacerbation: Secondary | ICD-10-CM

## 2014-07-08 DIAGNOSIS — R918 Other nonspecific abnormal finding of lung field: Secondary | ICD-10-CM

## 2014-07-08 MED ORDER — LEVALBUTEROL HCL 0.63 MG/3ML IN NEBU
0.6300 mg | INHALATION_SOLUTION | Freq: Once | RESPIRATORY_TRACT | Status: AC
Start: 2014-07-08 — End: 2014-07-08
  Administered 2014-07-08: 0.63 mg via RESPIRATORY_TRACT

## 2014-07-08 NOTE — Progress Notes (Signed)
Subjective:    Patient ID: [name], female    DOB: [birth year], 74 y.o.   MRN: [id number]  HPI 10/04/10-69 yoF never smoker with hx of asthma, left hilar mass/ PET negative.. Last here in 2008 after hospital stay, when she saw [name] noting issues of hypertension and asthma meds. Notes reviewed. Seen now on referral by [name] after recent flare with shortness of breath, productive cough. She tends to feel better as long as she is on an antibiotic or steroids. Within a couple of weeks off, she will start to cough and get tight again. Had bronchitis as a child. In the last 5-6 years wheezing dyspnea has been more perennial. Occasionally wakes with cough. Sputum often yellow. Has had pneumonia x 2 with pneumovax. Little GERD or acid indigestion recognized. Triggers have included strong odors, seasonal pollens, chest colds, but not house dust. Had a sinus infection during the winter. Has been treated for heart rhythm and BP, but denies MI/ angina. She is using Symbicort 160 now and considers it sufficient used twice every day. She did not fill a script for a Proair rescue inhaler. 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  11/10/10- 69 yoF never smoker with hx of asthma, left hilar mass/ PET neg in 2008/ now progressive, RML atelectasis. CT 11/03/10- showed change progression since prior studies of 2008, when abnormalities / PET negative, were attributed to scarring. -Now 2.6 x 2,2 cm left hilar mass, slowly growing. Post obstructive LLL opacity. New RML collapse. Scattered bronchiectasis. No effusion. Denies fever, night sweat, nodes. Cough has been a little less, with brownish to clear mucus, no blood. Has to chew deliberately to avoid choking. Has rarely awakened with hard cough. Symbicort has helped cough. No weight loss. No chest pain, but some shifting back pain. We reviewed her images and the technique and risks of bronchoscopy.   12/12/10- 69 yoF never smoker with hx of asthma, left hilar mass/ PET  neg in 2008/ now progressive, RML atelectasis. PET neg 2012. Husband here  Post bronchoscopy f/u - Bronch 11/30/10- necrotic appearing mass occluding left lower lobe bronchus.  Path- small lymphocytes and eosinophils, neg for malignancy or granuloma. Cultures negative so far.  She still coughs up phlegm, but less than before bronchoscopy.  Aware of a variable nodule in left axilla- comes and goes, otherwise no lumps, nodes or etc. Occasional sweat on morning waking, otherwise no fever or sweat and no pain or blood. She has been aware "something is going on" but not toxic.   11/23/11-  61 yoF never smoker with hx of asthma, left hilar mass/ PET neg in 2008/ now progressive, RML atelectasis. PET neg 2012.  She says she is doing "fair". She has had bronchoscopy/ [name] after me ."Benign inflammatory"  Coughs more with stress. She had gone to emergency room June 8 with anxiety and shortness of breath. We subsequently called in prednisone taper. She denies fever, sweat, adenopathy or chest pain. Often short of breath if anxious. She avoids exertion such as climbing stairs. Prednisone helped cough more productive and she feels better having taken it. Phlegm now is yellow, sometimes darker with no blood. She says that "a bug bite escalates my anxiety". Lab-eosinophils 8%, always high. CXR 10/06/11 IMPRESSION:  No acute infiltrate or pulmonary edema. There is a cavitary lesion  in the left lower lobe retrocardiac region measures at least 2 cm.  Further evaluation with enhanced CT of the chest is recommended.  Original Report Authenticated By: [name]  POP, M.D.    12/25/11-  61 yoF never smoker with hx of asthma, left hilar mass/ PET neg in 2008/ now progressive, RML atelectasis. PET neg 2012.  States breathing has gotten better since last ov; review CT with patient in more detail Off prednisone since July 26. Did not need Symbicort. Cough produces some light yellow sputum but she denies fever, sweat, chest  pain. She wakes coughing occasionally. CT chest 12/25/11- images reviewed with her: IMPRESSION:  Interval decrease in size of the left infrahilar lesion which has  become air-filled in the interval. It is possible that this could  represent some central cystic bronchiectasis or decompression of  the previous fluid density nodule.  Interval re-expansion of the right middle lobe.  No new or progressive findings.  Original Report Authenticated By: [name], M.D.   06/24/12- 58 yoF never smoker with hx of asthma, left hilar mass/ PET neg in 2008/ then progressive, RML atelectasis. PET neg 2012.  FOLLOWS FOR: was doing pretty good until today-started having increased SOB ; unsure if wheezing. Family here With persistent questioning we determined that for the past month she had been noticing orthopnea, propping up to sleep. No palpitation ankle edema. Over the last 3 or 4 days she began noting dry throat and some mild low back ache. She had had diarrhea for a few days after church parking in mid February but that had resolved except for some residual urgency. No fever, chills. Sputum usually white, rarely brown. No chest pain, blood or swollen glands  07/08/12 71 yoF never smoker with hx of asthma, left hilar mass/ PET neg in 2008/ then progressive, RML atelectasis. PET neg 2012.    Son here FOLLOWS FOR: states breathing is slightly better since 2 weeks ago; ROOM AIR O2 of 90% upon arrival She feels she is doing better she comes in looking well dressed, stronger and comfortable occasional productive cough, scant clear sputum or little yellow-brown. No blood. Denies chest pain, fever or sweat. She is carrying her oxygen. Labs 06/24/12- CBC normaal except eosinophilia 9%. LFTs normal. Chemistry normal except CO2 34 suggesting compensation for respiratory acidosis. CT chest 06/26/12- images were reviewed with her and her son. IMPRESSION:  1. No evidence for acute pulmonary embolus.  2. Ground-glass  attenuation and peripheral nodular consolidation  within the right lower lobe consistent with pneumonitis. Findings  likely sequela of aspiration and/or pneumonia.  3. Cavitary lesion versus cystic bronchiectasis within the  perihilar left lower lobe. That this is unchanged in size from  previous exam but now contains a central soft tissue attenuating  filling defect which may represent a mycetoma.  4. Diffuse, bilateral bronchial wall thickening which is favored  to represent the sequela of chronic aspiration and bronchitis.  Original Report Authenticated By: Kerby Moors, M.D.  08/12/12- 38 yoF never smoker with hx of asthma, left hilar mass/ PET neg in 2008/ then progressive, RML atelectasis. PET neg 2012.    Son here FOLLOWS FOR: review Barium swallow results in detail with patient; having SOB and wheezing-worse with activity She reports feeling much better compared with last winter. Still some cough and shortness of breath white or slightly brown sputum. Dyspnea with vigorous exertion/stairs. Denies sweats or fever, chest pain or palpitation. Steroids make her feel better. Sleeps with O2 2L/ Advanced. Barium swallow 07/15/12- negative for evidence of reflux or aspiration.  09/19/12- 68 yoF never smoker with hx of asthma/ bronchiectasis, left hilar mass/cyst PET neg in 2008/ then progressive, RML  atelectasis. PET neg 2012.     FOLLOWS FOR: dry nasal area, watery eyes, cough that just started today. Strong perfume made her nose run. Eyes fdry CXR 08/22/12 IMPRESSION:  No edema or consolidation. The lesions seen recently on CT in the  left infrahilar region is not seen on chest radiography. In this  regard, suggest correlation with chest CT to further assess this  area on the left.  Original Report Authenticated By: Lowella Grip, M.D.  07/08/14- 19 yoF never smoker with hx of asthma/ bronchiectasis, left hilar mass/cyst PET neg in 2008/ then progressive, RML atelectasis. PET neg 2012.  Female companion here FOLLOWS FOR/ACUTE VISIT: Last seen 2014; having increased mucus-hard to get up; SOB and wheezing-worse with activity. No fevers however slight warmness at times. Waking more frequently with cough and choking that sounds like reflux events. Difficult for her to cough out phlegm. Denies fever or sweat. Admits shortness of breath particularly with exertion. Sleeps with head up on pillows. CT chest 06/26/12- reviewed with her IMPRESSION: 1. No evidence for acute pulmonary embolus. 2. Ground-glass attenuation and peripheral nodular consolidation within the right lower lobe consistent with pneumonitis. Findings likely sequela of aspiration and/or pneumonia. 3. Cavitary lesion versus cystic bronchiectasis within the perihilar left lower lobe. That this is unchanged in size from previous exam but now contains a central soft tissue attenuating filling defect which may represent a mycetoma. 4. Diffuse, bilateral bronchial wall thickening which is favored to represent the sequela of chronic aspiration and bronchitis. Original Report Authenticated By: Kerby Moors, M.D. CXR 02/18/14 IMPRESSION: Nodular opacities and persistent collapse of right middle lung. Suggest CT to exclude neoplasm. Diffuse emphysematous changes in the lungs. Electronically Signed  By: Lucienne Capers M.D.  On: 02/18/2014 04:51 Office spirometry 07/08/2014 Pre and postbronchodilator: Very severe obstructive airways disease with severe restriction of FVC and insignificant response to bronchodilator. FVC 0.90/41%, FEV1 0.44/27%, FEV1/FVC 0.49  ROS-see HPI Constitutional:   No-   weight loss, night sweats, fevers, chills, fatigue, lassitude. HEENT:   No-  headaches, difficulty swallowing, tooth/dental problems, sore throat,       No-  sneezing, itching, ear ache, nasal congestion, post nasal drip,  CV:  No-   chest pain, +orthopnea, no-PND, swelling in lower extremities, anasarca,  dizziness,  palpitations Resp: +   shortness of breath with exertion or at rest.              +  productive cough,  + non-productive cough,  No- coughing up of blood.              No-change in color of mucus.  No- wheezing.   Skin: No-   rash or lesions. GI:  No-   heartburn, indigestion, abdominal pain, nausea, vomiting,  GU: . MS:  No-   joint pain or swelling.   Neuro-     nothing unusual Psych:  No- change in mood or affect. No depression or anxiety.  No memory loss.  OBJ- Physical Exam General- Alert, Oriented, Affect-appropriate, Distress- none acute. Thin Skin- rash-none, lesions- none, excoriation- none   Lymphadenopathy- none Head- atraumatic            Eyes- Gross vision intact, PERRLA, conjunctivae and secretions clear            Ears- +somewhat hard of hearing            Nose- Clear, no-Septal dev, mucus, polyps, erosion, perforation  Throat- Mallampati II , mucosa clear , drainage- none, tonsils- atrophic Neck- flexible , trachea midline, no stridor , thyroid nl, carotid no bruit Chest - symmetrical excursion , unlabored           Heart/CV- RRR , no murmur , no gallop  , no rub, nl s1 s2                           - JVD- none , edema- none, stasis changes- none, varices- none           Lung- + wheeze and squeaks, unlabored, cough- none , dullness-none, rub- none           Chest wall-  Abd- Br/ Gen/ Rectal- Not done, not indicated Extrem- cyanosis- none, clubbing, none, atrophy- none, strength- nl Neuro- grossly intact to observation

## 2014-07-08 NOTE — Patient Instructions (Addendum)
Order- CXR   Dx bronchiectasis with exacerbation  Office spirometry before and after neb xop 0.63    Take the Zpak antibiotic that was called in yesterday  Order- referral to pulmonary rehab

## 2014-07-09 ENCOUNTER — Telehealth: Payer: Self-pay | Admitting: Internal Medicine

## 2014-07-09 ENCOUNTER — Other Ambulatory Visit (INDEPENDENT_AMBULATORY_CARE_PROVIDER_SITE_OTHER): Payer: Commercial Managed Care - HMO

## 2014-07-09 DIAGNOSIS — R918 Other nonspecific abnormal finding of lung field: Secondary | ICD-10-CM

## 2014-07-09 LAB — BASIC METABOLIC PANEL
BUN: 10 mg/dL (ref 6–23)
CO2: 34 mEq/L — ABNORMAL HIGH (ref 19–32)
CREATININE: 0.86 mg/dL (ref 0.40–1.20)
Calcium: 10 mg/dL (ref 8.4–10.5)
Chloride: 99 mEq/L (ref 96–112)
GFR: 83.14 mL/min (ref >60.00)
Glucose, Bld: 91 mg/dL (ref 70–99)
Potassium: 4.5 mEq/L (ref 3.5–5.1)
Sodium: 138 mEq/L (ref 135–145)

## 2014-07-09 NOTE — Telephone Encounter (Signed)
Pt is returning PCC's call to schedule CT per referral notes Referral Notes     Type Date User   General 07/09/2014 10:47 AM LAW, DAWNE J        Note   Pt's phone is busy, will keep trying to reach pt Dawne J Law         Type Date User   General 07/09/2014 10:44 AM LAW, DAWNE J        Note   Pt scheduled @[facility]  07/13/14 arrival @12 :45 pm Dawne J Law      Unable to leave a voicemail d/t mailbox being full - left # to call back on pager

## 2014-07-10 NOTE — Assessment & Plan Note (Signed)
Saccular bronchiectasis with tendency to accumulate large amounts of retained secretions and possibly aspirated food stuff. Previous bronchoscopies and yielded only benign necrotic debris. She may be experiencing an acute bronchitis exacerbation now. Most of her problem is chronic. Plan-nebulizer treatments Xopenex, Z-Pak, chest x-ray, walk for endurance and to expand lungs

## 2014-07-12 NOTE — Telephone Encounter (Signed)
Attempted to call pt. Line was busy. Will need to call back.

## 2014-07-13 ENCOUNTER — Ambulatory Visit (INDEPENDENT_AMBULATORY_CARE_PROVIDER_SITE_OTHER)
Admission: RE | Admit: 2014-07-13 | Discharge: 2014-07-13 | Disposition: A | Discharge status: Home / Self Care | Payer: Commercial Managed Care - HMO | Source: Ambulatory Visit | Attending: Internal Medicine | Admitting: Internal Medicine

## 2014-07-13 ENCOUNTER — Telehealth: Payer: Self-pay | Admitting: Internal Medicine

## 2014-07-13 DIAGNOSIS — R918 Other nonspecific abnormal finding of lung field: Secondary | ICD-10-CM

## 2014-07-13 MED ORDER — IOHEXOL 300 MG/ML  SOLN
80.0000 mL | Freq: Once | INTRAMUSCULAR | Status: AC | PRN
  Administered 2014-07-13: 80 mL via INTRAVENOUS

## 2014-07-13 NOTE — Telephone Encounter (Signed)
Call report on CT Chest w/ Contrast: IMPRESSION: New right middle lobe collapse, with near complete occlusion of central right middle lobe bronchus. This is suspicious for nonobstructing endobronchial mass, and bronchoscopy should be considered for further evaluation.  2.6 cm left infrahilar/ lower lobe nodule, also suspicious for bronchogenic carcinoma. This nodule should also be amenable to evaluation by bronchoscopy.  No evidence of lymphadenopathy or pleural effusion.  Bilateral peribronchial thickening with mild patchy airspace disease in left upper lobe, likely infectious or inflammatory in etiology.  CT report also printed for CDY to review Message routed

## 2014-07-13 NOTE — Telephone Encounter (Signed)
I have asked Dr Lake Bells to look at this with me

## 2014-07-16 ENCOUNTER — Telehealth: Payer: Self-pay | Admitting: Internal Medicine

## 2014-07-16 MED ORDER — FLUTTER DEVI: Status: AC

## 2014-07-16 NOTE — Telephone Encounter (Signed)
I had reviewed case with Dr Lake Bells who was kind enough to review prior imaging. He agrees with me that this is the same pattern of bronchiectasis with mucus/ debris retention that we have seen with her through the years, not a new process. I called and discussed with [name] who is not interested in another bronch, since those didn't change pattern in past. We will emphasize pulmonary toilet.  Plan- order Flutter device. Encourage mobilization. Add Mucinex. We can see what her insurance would require to qualify for a VEST device if simpler measures aren't sufficient.

## 2014-07-19 NOTE — Telephone Encounter (Signed)
Spoke with patient-she will come by this afternoon to be taught how to use flutter. Triage will show her how to use as I am with a MD this afternoon. Thanks.

## 2014-07-23 ENCOUNTER — Telehealth (HOSPITAL_COMMUNITY): Payer: Self-pay

## 2014-07-23 NOTE — Telephone Encounter (Signed)
I have called and left a message with Lesieli to inquire about participation in Pulmonary Rehab. Will send letter in mail and follow up.

## 2014-07-31 ENCOUNTER — Telehealth: Payer: Self-pay | Admitting: Internal Medicine

## 2014-07-31 MED ORDER — LEVOFLOXACIN 500 MG PO TABS: 500.0000 mg | ORAL_TABLET | Freq: Every day | ORAL | Status: DC

## 2014-07-31 NOTE — Telephone Encounter (Signed)
On call. Chronic bronchiectasis. Few day worsening cough, now green. Doesn't know about fever. Plan-  levaquin to her drug store

## 2014-08-02 ENCOUNTER — Telehealth (HOSPITAL_COMMUNITY): Payer: Self-pay

## 2014-08-02 NOTE — Telephone Encounter (Signed)
I have called and left a message with Wakeelah to inquire about participation in Pulmonary Rehab per Dr. Janee Morn referral. Will send letter in mail and follow up.

## 2014-08-20 ENCOUNTER — Telehealth (HOSPITAL_COMMUNITY): Payer: Self-pay

## 2014-08-20 NOTE — Telephone Encounter (Signed)
I have called and left a message with Meghna to inquire about participation in Pulmonary Rehab per Dr. Janee Morn referral. Will send letter in mail and follow up.

## 2014-08-27 ENCOUNTER — Telehealth (HOSPITAL_COMMUNITY): Payer: Self-pay

## 2014-08-27 NOTE — Telephone Encounter (Signed)
Patient returned phone call regarding entrance to Pulmonary Rehab.  Patient states that they are interested in attending the program.  [name] is going to verify insurance coverage and follow up.

## 2014-08-29 ENCOUNTER — Emergency Department (HOSPITAL_COMMUNITY)
Admission: EM | Admit: 2014-08-29 | Discharge: 2014-08-30 | Disposition: A | Discharge status: Home / Self Care | Payer: Commercial Managed Care - HMO | Attending: Emergency Medicine | Admitting: Emergency Medicine

## 2014-08-29 ENCOUNTER — Encounter (HOSPITAL_COMMUNITY): Payer: Self-pay | Admitting: Emergency Medicine

## 2014-08-29 ENCOUNTER — Emergency Department (HOSPITAL_COMMUNITY): Payer: Commercial Managed Care - HMO

## 2014-08-29 DIAGNOSIS — Z79899 Other long term (current) drug therapy: Secondary | ICD-10-CM | POA: Diagnosis not present

## 2014-08-29 DIAGNOSIS — J209 Acute bronchitis, unspecified: Secondary | ICD-10-CM

## 2014-08-29 DIAGNOSIS — Z7951 Long term (current) use of inhaled steroids: Secondary | ICD-10-CM | POA: Diagnosis not present

## 2014-08-29 DIAGNOSIS — J45901 Unspecified asthma with (acute) exacerbation: Secondary | ICD-10-CM | POA: Insufficient documentation

## 2014-08-29 DIAGNOSIS — I10 Essential (primary) hypertension: Secondary | ICD-10-CM | POA: Diagnosis not present

## 2014-08-29 DIAGNOSIS — Z880 Allergy status to penicillin: Secondary | ICD-10-CM | POA: Diagnosis not present

## 2014-08-29 DIAGNOSIS — Z91040 Latex allergy status: Secondary | ICD-10-CM | POA: Insufficient documentation

## 2014-08-29 DIAGNOSIS — Z792 Long term (current) use of antibiotics: Secondary | ICD-10-CM | POA: Diagnosis not present

## 2014-08-29 DIAGNOSIS — R0602 Shortness of breath: Secondary | ICD-10-CM | POA: Diagnosis present

## 2014-08-29 DIAGNOSIS — Z8669 Personal history of other diseases of the nervous system and sense organs: Secondary | ICD-10-CM | POA: Diagnosis not present

## 2014-08-29 LAB — I-STAT CHEM 8, ED
BUN: 14 mg/dL (ref 6–20)
CALCIUM ION: 1.21 mmol/L (ref 1.13–1.30)
Chloride: 99 mmol/L — ABNORMAL LOW (ref 101–111)
Creatinine, Ser: 1 mg/dL (ref 0.44–1.00)
Glucose, Bld: 116 mg/dL — ABNORMAL HIGH (ref 70–99)
HCT: 51 % — ABNORMAL HIGH (ref 36.0–46.0)
HEMOGLOBIN: 17.3 g/dL — AB (ref 12.0–15.0)
Potassium: 4.3 mmol/L (ref 3.5–5.1)
SODIUM: 140 mmol/L (ref 135–145)
TCO2: 28 mmol/L (ref 0–100)

## 2014-08-29 LAB — CBC WITH DIFFERENTIAL/PLATELET
BASOS PCT: 1 % (ref 0–1)
Basophils Absolute: 0.1 10*3/uL (ref 0.0–0.1)
EOS PCT: 11 % — AB (ref 0–5)
Eosinophils Absolute: 1 10*3/uL — ABNORMAL HIGH (ref 0.0–0.7)
HCT: 47.2 % — ABNORMAL HIGH (ref 36.0–46.0)
Hemoglobin: 15.3 g/dL — ABNORMAL HIGH (ref 12.0–15.0)
Lymphocytes Relative: 35 % (ref 12–46)
Lymphs Abs: 2.9 10*3/uL (ref 0.7–4.0)
MCH: 29.5 pg (ref 26.0–34.0)
MCHC: 32.4 g/dL (ref 30.0–36.0)
MCV: 90.9 fL (ref 78.0–100.0)
MONOS PCT: 9 % (ref 3–12)
Monocytes Absolute: 0.8 10*3/uL (ref 0.1–1.0)
NEUTROS PCT: 44 % (ref 43–77)
Neutro Abs: 3.7 10*3/uL (ref 1.7–7.7)
PLATELETS: 308 10*3/uL (ref 150–400)
RBC: 5.19 MIL/uL — AB (ref 3.87–5.11)
RDW: 13.7 % (ref 11.5–15.5)
WBC: 8.4 10*3/uL (ref 4.0–10.5)

## 2014-08-29 LAB — I-STAT TROPONIN, ED: TROPONIN I, POC: 0 ng/mL (ref 0.00–0.08)

## 2014-08-29 MED ORDER — ALBUTEROL SULFATE (2.5 MG/3ML) 0.083% IN NEBU
5.0000 mg | INHALATION_SOLUTION | Freq: Once | RESPIRATORY_TRACT | Status: AC
  Administered 2014-08-29: 5 mg via RESPIRATORY_TRACT
  Filled 2014-08-29: qty 6

## 2014-08-29 MED ORDER — PREDNISONE 20 MG PO TABS
60.0000 mg | ORAL_TABLET | Freq: Once | ORAL | Status: AC
  Administered 2014-08-29: 60 mg via ORAL
  Filled 2014-08-29: qty 3

## 2014-08-29 MED ORDER — IPRATROPIUM BROMIDE 0.02 % IN SOLN
0.5000 mg | Freq: Once | RESPIRATORY_TRACT | Status: AC
  Administered 2014-08-29: 0.5 mg via RESPIRATORY_TRACT
  Filled 2014-08-29: qty 2.5

## 2014-08-29 NOTE — ED Notes (Signed)
Pt c/o chest congestion and shortness of breath.  Onset 2 days ago, worse tonight

## 2014-08-30 MED ORDER — ALBUTEROL SULFATE HFA 108 (90 BASE) MCG/ACT IN AERS: 2.0000 | INHALATION_SPRAY | RESPIRATORY_TRACT | Status: DC | PRN

## 2014-08-30 MED ORDER — PREDNISONE 10 MG PO TABS: 40.0000 mg | ORAL_TABLET | Freq: Every day | ORAL | Status: DC

## 2014-08-30 MED ORDER — AZITHROMYCIN 250 MG PO TABS: 250.0000 mg | ORAL_TABLET | Freq: Every day | ORAL | Status: DC

## 2014-08-30 NOTE — ED Provider Notes (Signed)
CSN: [id number]     Arrival date & time 08/29/14  2101 History   First MD Initiated Contact with Patient 08/29/14 2200     Chief Complaint  Patient presents with  . Shortness of Breath      HPI Patient presents emergency department with complaints of Chest congestion shortness of breath over the past 2 days.  Tried Symbicort at home without significant improvement.  Did take one albuterol with improvement.  No fevers or chills but does report productive cough.  She tried one Levaquin today and this did not improve her symptoms.  She is concerned that she could have had an allergic reaction to Levaquin because she felt that she broke out on her left forearm.  Patient has a history of asthma and anxiety   Past Medical History  Diagnosis Date  . Anxiety   . Asthma   . HTN (hypertension)   . Allergic rhinitis    Past Surgical History  Procedure Laterality Date  . Tubal ligation     Family History  Problem Relation Age of Onset  . Diabetes Father   . Asthma Father   . Allergies Father   . Mental illness Mother     alzheimer's  . Allergies Brother   . Allergies Brother   . Heart disease Maternal Aunt    History  Substance Use Topics  . Smoking status: Never Smoker   . Smokeless tobacco: Never Used     Comment: father smoked, worked w/smokers  . Alcohol Use: No   OB History    No data available     Review of Systems  All other systems reviewed and are negative.     Allergies  Penicillins; Shellfish allergy; Sulfonamide derivatives; Aspirin; Bee venom; Clindamycin; Fluticasone-salmeterol; Fruit & vegetable daily; Latex; and Montelukast sodium  Home Medications   Prior to Admission medications   Medication Sig Start Date End Date Taking? Authorizing Provider  acetaminophen (TYLENOL) 325 MG tablet Take 325 mg by mouth every 6 (six) hours as needed. For pain    Historical Provider, MD  albuterol (PROAIR HFA) 108 (90 BASE) MCG/ACT inhaler Inhale 2 puffs into the lungs  every 4 (four) hours as needed for wheezing or shortness of breath. For shortness of breath 02/18/14   Linton Flemings, MD  albuterol (PROVENTIL) (2.5 MG/3ML) 0.083% nebulizer solution Take 3 mLs (2.5 mg total) by nebulization every 4 (four) hours as needed for wheezing or shortness of breath. 02/18/14   Linton Flemings, MD  Ascorbic Acid (VITAMIN C PO) Take 1 tablet by mouth daily.     Historical Provider, MD         B Complex Vitamins (VITAMIN B COMPLEX PO) Take 1 tablet by mouth daily as needed (takes when she remembers to take it).     Historical Provider, MD  budesonide-formoterol (SYMBICORT) 160-4.5 MCG/ACT inhaler Inhale 1 puff into the lungs 2 (two) times daily. 06/07/11   Aleksei Plotnikov V, MD  cholecalciferol (VITAMIN D) 1000 UNITS tablet Take 1,000 Units by mouth daily.    Historical Provider, MD  Dextromethorphan-Guaifenesin [facility] DM PO) Take 1 tablet by mouth daily as needed (for congestion).     Historical Provider, MD  diltiazem (CARDIZEM) 120 MG tablet take 1 tablet by mouth twice a day 10/22/13   Cassandria Anger, MD  diphenhydrAMINE (BENADRYL) 25 MG tablet Take 25 mg by mouth every 6 (six) hours as needed for allergies.    Historical Provider, MD  levofloxacin (LEVAQUIN) 500 MG tablet Take  1 tablet (500 mg total) by mouth daily. 07/31/14   Deneise Lever, MD  loratadine (CLARITIN) 10 MG tablet Take 10 mg by mouth daily as needed for allergies.  08/16/10   Aleksei Plotnikov V, MD  Multiple Vitamin (MULTIVITAMIN) tablet Take 1 tablet by mouth daily.      Historical Provider, MD         promethazine-codeine (PHENERGAN WITH CODEINE) 6.25-10 MG/5ML syrup Take 5 mLs by mouth every 6 (six) hours as needed for cough. 04/01/14   Cassandria Anger, MD  Respiratory Therapy Supplies (FLUTTER) DEVI Blow through 4 times per set and repeat 3 sets per day, to loosen lung secretions. 07/16/14   Deneise Lever, MD   BP 139/63 mmHg  Pulse 99  Temp(Src) 98.1 F (36.7 C) (Oral)  Resp 24  Ht 5\' 2"  (1.575  m)  Wt 113 lb 4.8 oz (51.393 kg)  BMI 20.72 kg/m2  SpO2 96% Physical Exam  Constitutional: She is oriented to person, place, and time. She appears well-developed and well-nourished. No distress.  HENT:  Head: Normocephalic and atraumatic.  Eyes: EOM are normal.  Neck: Normal range of motion.  Cardiovascular: Normal rate, regular rhythm and normal heart sounds.   Pulmonary/Chest: Effort normal. She has wheezes.  Abdominal: Soft. She exhibits no distension. There is no tenderness.  Musculoskeletal: Normal range of motion.  Neurological: She is alert and oriented to person, place, and time.  Skin: Skin is warm and dry.  Psychiatric: She has a normal mood and affect. Judgment normal.  Nursing note and vitals reviewed.   ED Course  Procedures (including critical care time) Labs Review Labs Reviewed  CBC WITH DIFFERENTIAL/PLATELET - Abnormal; Notable for the following:    RBC 5.19 (*)    Hemoglobin 15.3 (*)    HCT 47.2 (*)    Eosinophils Relative 11 (*)    Eosinophils Absolute 1.0 (*)    All other components within normal limits  I-STAT CHEM 8, ED - Abnormal; Notable for the following:    Chloride 99 (*)    Glucose, Bld 116 (*)    Hemoglobin 17.3 (*)    HCT 51.0 (*)    All other components within normal limits  I-STAT TROPOININ, ED    Imaging Review Dg Chest 2 View  08/29/2014   CLINICAL DATA:  Difficulty breathing and weakness.  EXAM: CHEST  2 VIEW  COMPARISON:  None.  FINDINGS: Normal cardiac silhouette. Lungs are hyperinflated. No effusion, infiltrate, pneumothorax. Resolution of the right middle lobe atelectasis. The left lower lobe nodule is not well appreciated.  IMPRESSION: Hyperinflated lungs with chronic bronchitic markings. No acute findings.   Electronically Signed   By: Suzy Bouchard M.D.   On: 08/29/2014 22:00  I personally reviewed the imaging tests through PACS system I reviewed available ER/hospitalization records through the EMR    EKG  Interpretation   Date/Time:  Sunday Aug 29 2014 21:24:35 EDT Ventricular Rate:  100 PR Interval:  154 QRS Duration: 94 QT Interval:  340 QTC Calculation: 438 R Axis:   -64 Text Interpretation:  Normal sinus rhythm Right atrial enlargement  Pulmonary disease pattern Incomplete right bundle branch block Left  anterior fascicular block Abnormal ECG No significant change was found  Confirmed by Terrall Bley  MD, Lennette Bihari ([id number]) on 08/29/2014 10:18:40 PM      MDM   Final diagnoses:  Bronchitis with bronchospasm    Patient feels much better after albuterol.  Discharge home in good condition.  Likely bronchitis  with bronchospasm.  Patient is interested in antibiotics will cover with azithromycin.  I'm not completely convinced that she had an allergic reaction from the Levaquin.    Jola Schmidt, MD 08/30/14 [phone]

## 2014-09-02 ENCOUNTER — Telehealth (HOSPITAL_COMMUNITY): Payer: Self-pay

## 2014-09-06 ENCOUNTER — Ambulatory Visit (INDEPENDENT_AMBULATORY_CARE_PROVIDER_SITE_OTHER): Payer: Commercial Managed Care - HMO | Admitting: Internal Medicine

## 2014-09-06 ENCOUNTER — Encounter: Payer: Self-pay | Admitting: Internal Medicine

## 2014-09-06 VITALS — BP 142/68 | HR 99 | Ht 62.0 in | Wt 113.0 lb

## 2014-09-06 DIAGNOSIS — J470 Bronchiectasis with acute lower respiratory infection: Secondary | ICD-10-CM

## 2014-09-06 DIAGNOSIS — J189 Pneumonia, unspecified organism: Secondary | ICD-10-CM | POA: Diagnosis not present

## 2014-09-06 MED ORDER — CEFDINIR 300 MG PO CAPS: 300.0000 mg | ORAL_CAPSULE | Freq: Two times a day (BID) | ORAL | Status: DC

## 2014-09-06 NOTE — Patient Instructions (Addendum)
Script sent for cefdinir antibiotic  If you start itching, stop the cephalosporin and take claritin.  Use your flutter device- blow through 4 times per set, and repeat 3 sets per day  to help move the mucus out of your chest  Deep breaths and deep cough will also help you clear  You can call [facility] for refills of the nebulizer medicine  Try a little chilled Ensure or Boost liquid nutrition right after each meal

## 2014-09-06 NOTE — Progress Notes (Signed)
Subjective:    Patient ID: [name], female    DOB: [birth year], 74 y.o.   MRN: [id number]  HPI 10/04/10-69 yoF never smoker with hx of asthma, left hilar mass/ PET negative.. Last here in 2008 after hospital stay, when she saw [name] noting issues of hypertension and asthma meds. Notes reviewed. Seen now on referral by [name] after recent flare with shortness of breath, productive cough. She tends to feel better as long as she is on an antibiotic or steroids. Within a couple of weeks off, she will start to cough and get tight again. Had bronchitis as a child. In the last 5-6 years wheezing dyspnea has been more perennial. Occasionally wakes with cough. Sputum often yellow. Has had pneumonia x 2 with pneumovax. Little GERD or acid indigestion recognized. Triggers have included strong odors, seasonal pollens, chest colds, but not house dust. Had a sinus infection during the winter. Has been treated for heart rhythm and BP, but denies MI/ angina. She is using Symbicort 160 now and considers it sufficient used twice every day. She did not fill a script for a Proair rescue inhaler. 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  11/10/10- 69 yoF never smoker with hx of asthma, left hilar mass/ PET neg in 2008/ now progressive, RML atelectasis. CT 11/03/10- showed change progression since prior studies of 2008, when abnormalities / PET negative, were attributed to scarring. -Now 2.6 x 2,2 cm left hilar mass, slowly growing. Post obstructive LLL opacity. New RML collapse. Scattered bronchiectasis. No effusion. Denies fever, night sweat, nodes. Cough has been a little less, with brownish to clear mucus, no blood. Has to chew deliberately to avoid choking. Has rarely awakened with hard cough. Symbicort has helped cough. No weight loss. No chest pain, but some shifting back pain. We reviewed her images and the technique and risks of bronchoscopy.   12/12/10- 69 yoF never smoker with hx of asthma, left hilar mass/ PET  neg in 2008/ now progressive, RML atelectasis. PET neg 2012. Husband here  Post bronchoscopy f/u - Bronch 11/30/10- necrotic appearing mass occluding left lower lobe bronchus.  Path- small lymphocytes and eosinophils, neg for malignancy or granuloma. Cultures negative so far.  She still coughs up phlegm, but less than before bronchoscopy.  Aware of a variable nodule in left axilla- comes and goes, otherwise no lumps, nodes or etc. Occasional sweat on morning waking, otherwise no fever or sweat and no pain or blood. She has been aware "something is going on" but not toxic.   11/23/11-  25 yoF never smoker with hx of asthma, left hilar mass/ PET neg in 2008/ now progressive, RML atelectasis. PET neg 2012.  She says she is doing "fair". She has had bronchoscopy/ [name] after me ."Benign inflammatory"  Coughs more with stress. She had gone to emergency room June 8 with anxiety and shortness of breath. We subsequently called in prednisone taper. She denies fever, sweat, adenopathy or chest pain. Often short of breath if anxious. She avoids exertion such as climbing stairs. Prednisone helped cough more productive and she feels better having taken it. Phlegm now is yellow, sometimes darker with no blood. She says that "a bug bite escalates my anxiety". Lab-eosinophils 8%, always high. CXR 10/06/11 IMPRESSION:  No acute infiltrate or pulmonary edema. There is a cavitary lesion  in the left lower lobe retrocardiac region measures at least 2 cm.  Further evaluation with enhanced CT of the chest is recommended.  Original Report Authenticated By: [name]  POP, M.D.    12/25/11-  77 yoF never smoker with hx of asthma, left hilar mass/ PET neg in 2008/ now progressive, RML atelectasis. PET neg 2012.  States breathing has gotten better since last ov; review CT with patient in more detail Off prednisone since July 26. Did not need Symbicort. Cough produces some light yellow sputum but she denies fever, sweat, chest  pain. She wakes coughing occasionally. CT chest 12/25/11- images reviewed with her: IMPRESSION:  Interval decrease in size of the left infrahilar lesion which has  become air-filled in the interval. It is possible that this could  represent some central cystic bronchiectasis or decompression of  the previous fluid density nodule.  Interval re-expansion of the right middle lobe.  No new or progressive findings.  Original Report Authenticated By: [name], M.D.   06/24/12- 83 yoF never smoker with hx of asthma, left hilar mass/ PET neg in 2008/ then progressive, RML atelectasis. PET neg 2012.  FOLLOWS FOR: was doing pretty good until today-started having increased SOB ; unsure if wheezing. Family here With persistent questioning we determined that for the past month she had been noticing orthopnea, propping up to sleep. No palpitation ankle edema. Over the last 3 or 4 days she began noting dry throat and some mild low back ache. She had had diarrhea for a few days after church parking in mid February but that had resolved except for some residual urgency. No fever, chills. Sputum usually white, rarely brown. No chest pain, blood or swollen glands  07/08/12 71 yoF never smoker with hx of asthma, left hilar mass/ PET neg in 2008/ then progressive, RML atelectasis. PET neg 2012.    Son here FOLLOWS FOR: states breathing is slightly better since 2 weeks ago; ROOM AIR O2 of 90% upon arrival She feels she is doing better she comes in looking well dressed, stronger and comfortable occasional productive cough, scant clear sputum or little yellow-brown. No blood. Denies chest pain, fever or sweat. She is carrying her oxygen. Labs 06/24/12- CBC normaal except eosinophilia 9%. LFTs normal. Chemistry normal except CO2 34 suggesting compensation for respiratory acidosis. CT chest 06/26/12- images were reviewed with her and her son. IMPRESSION:  1. No evidence for acute pulmonary embolus.  2. Ground-glass  attenuation and peripheral nodular consolidation  within the right lower lobe consistent with pneumonitis. Findings  likely sequela of aspiration and/or pneumonia.  3. Cavitary lesion versus cystic bronchiectasis within the  perihilar left lower lobe. That this is unchanged in size from  previous exam but now contains a central soft tissue attenuating  filling defect which may represent a mycetoma.  4. Diffuse, bilateral bronchial wall thickening which is favored  to represent the sequela of chronic aspiration and bronchitis.  Original Report Authenticated By: Kerby Moors, M.D.  08/12/12- 16 yoF never smoker with hx of asthma, left hilar mass/ PET neg in 2008/ then progressive, RML atelectasis. PET neg 2012.    Son here FOLLOWS FOR: review Barium swallow results in detail with patient; having SOB and wheezing-worse with activity She reports feeling much better compared with last winter. Still some cough and shortness of breath white or slightly brown sputum. Dyspnea with vigorous exertion/stairs. Denies sweats or fever, chest pain or palpitation. Steroids make her feel better. Sleeps with O2 2L/ [facility]. Barium swallow 07/15/12- negative for evidence of reflux or aspiration.  09/19/12- 55 yoF never smoker with hx of asthma/ bronchiectasis, left hilar mass/cyst PET neg in 2008/ then progressive, RML  atelectasis. PET neg 2012.     FOLLOWS FOR: dry nasal area, watery eyes, cough that just started today. Strong perfume made her nose run. Eyes fdry CXR 08/22/12 IMPRESSION:  No edema or consolidation. The lesions seen recently on CT in the  left infrahilar region is not seen on chest radiography. In this  regard, suggest correlation with chest CT to further assess this  area on the left.  Original Report Authenticated By: Lowella Grip, M.D.  07/08/14- 28 yoF never smoker with hx of asthma/ bronchiectasis, left hilar mass/cyst PET neg in 2008/ then progressive, RML atelectasis. PET neg 2012.  Female companion here FOLLOWS FOR/ACUTE VISIT: Last seen 2014; having increased mucus-hard to get up; SOB and wheezing-worse with activity. No fevers however slight warmness at times. Waking more frequently with cough and choking that sounds like reflux events. Difficult for her to cough out phlegm. Denies fever or sweat. Admits shortness of breath particularly with exertion. Sleeps with head up on pillows. CT chest 06/26/12- reviewed with her IMPRESSION: 1. No evidence for acute pulmonary embolus. 2. Ground-glass attenuation and peripheral nodular consolidation within the right lower lobe consistent with pneumonitis. Findings likely sequela of aspiration and/or pneumonia. 3. Cavitary lesion versus cystic bronchiectasis within the perihilar left lower lobe. That this is unchanged in size from previous exam but now contains a central soft tissue attenuating filling defect which may represent a mycetoma. 4. Diffuse, bilateral bronchial wall thickening which is favored to represent the sequela of chronic aspiration and bronchitis. Original Report Authenticated By: Kerby Moors, M.D. CXR 02/18/14 IMPRESSION: Nodular opacities and persistent collapse of right middle lung. Suggest CT to exclude neoplasm. Diffuse emphysematous changes in the lungs. Electronically Signed  By: Lucienne Capers M.D.  On: 02/18/2014 04:51 Office spirometry 07/08/2014 Pre and postbronchodilator: Very severe obstructive airways disease with severe restriction of FVC and insignificant response to bronchodilator. FVC 0.90/41%, FEV1 0.44/27%, FEV1/FVC 0.49  ROS-see HPI Constitutional:   No-   weight loss, night sweats, fevers, chills, fatigue, lassitude. HEENT:   No-  headaches, difficulty swallowing, tooth/dental problems, sore throat,       No-  sneezing, itching, ear ache, nasal congestion, post nasal drip,  CV:  No-   chest pain, +orthopnea, no-PND, swelling in lower extremities, anasarca,  dizziness,  palpitations Resp: +   shortness of breath with exertion or at rest.              +  productive cough,  + non-productive cough,  No- coughing up of blood.              No-change in color of mucus.  No- wheezing.   Skin: No-   rash or lesions. GI:  No-   heartburn, indigestion, abdominal pain, nausea, vomiting,  GU: . MS:  No-   joint pain or swelling.   Neuro-     nothing unusual Psych:  No- change in mood or affect. No depression or anxiety.  No memory loss.  OBJ- Physical Exam General- Alert, Oriented, Affect-appropriate, Distress- none acute. Thin Skin- rash-none, lesions- none, excoriation- none   Lymphadenopathy- none Head- atraumatic            Eyes- Gross vision intact, PERRLA, conjunctivae and secretions clear            Ears- +somewhat hard of hearing            Nose- Clear, no-Septal dev, mucus, polyps, erosion, perforation  Throat- Mallampati II , mucosa clear , drainage- none, tonsils- atrophic Neck- flexible , trachea midline, no stridor , thyroid nl, carotid no bruit Chest - symmetrical excursion , unlabored           Heart/CV- RRR , no murmur , no gallop  , no rub, nl s1 s2                           - JVD- none , edema- none, stasis changes- none, varices- none           Lung- + wheeze and squeaks, unlabored, cough- none , dullness-none, rub- none           Chest wall-  Abd- Br/ Gen/ Rectal- Not done, not indicated Extrem- cyanosis- none, clubbing, none, atrophy- none, strength- nl Neuro- grossly intact to observation     Subjective:    Patient ID: [name], female    DOB: Mar 08, 1941, 74 y.o.   MRN: [id number]  HPI 10/04/10-69 yoF never smoker with hx of asthma, left hilar mass/ PET negative.. Last here in 2008 after hospital stay, when she saw [name] noting issues of hypertension and asthma meds. Notes reviewed. Seen now on referral by [name] after recent flare with shortness of breath, productive cough. She tends to feel better as long as she  is on an antibiotic or steroids. Within a couple of weeks off, she will start to cough and get tight again. Had bronchitis as a child. In the last 5-6 years wheezing dyspnea has been more perennial. Occasionally wakes with cough. Sputum often yellow. Has had pneumonia x 2 with pneumovax. Little GERD or acid indigestion recognized. Triggers have included strong odors, seasonal pollens, chest colds, but not house dust. Had a sinus infection during the winter. Has been treated for heart rhythm and BP, but denies MI/ angina. She is using Symbicort 160 now and considers it sufficient used twice every day. She did not fill a script for a Proair rescue inhaler. 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  11/10/10- 69 yoF never smoker with hx of asthma, left hilar mass/ PET neg in 2008/ now progressive, RML atelectasis. CT 11/03/10- showed change progression since prior studies of 2008, when abnormalities / PET negative, were attributed to scarring. -Now 2.6 x 2,2 cm left hilar mass, slowly growing. Post obstructive LLL opacity. New RML collapse. Scattered bronchiectasis. No effusion. Denies fever, night sweat, nodes. Cough has been a little less, with brownish to clear mucus, no blood. Has to chew deliberately to avoid choking. Has rarely awakened with hard cough. Symbicort has helped cough. No weight loss. No chest pain, but some shifting back pain. We reviewed her images and the technique and risks of bronchoscopy.   12/12/10- 69 yoF never smoker with hx of asthma, left hilar mass/ PET neg in 2008/ now progressive, RML atelectasis. PET neg 2012. Husband here  Post bronchoscopy f/u - Bronch 11/30/10- necrotic appearing mass occluding left lower lobe bronchus.  Path- small lymphocytes and eosinophils, neg for malignancy or granuloma. Cultures negative so far.  She still coughs up phlegm, but less than before bronchoscopy.  Aware of a variable nodule in left axilla- comes and goes, otherwise no lumps, nodes or etc. Occasional  sweat on morning waking, otherwise no fever or sweat and no pain or blood. She has been aware "something is going on" but not toxic.   11/23/11-  41 yoF never smoker with hx of asthma, left hilar  mass/ PET neg in 2008/ now progressive, RML atelectasis. PET neg 2012.  She says she is doing "fair". She has had bronchoscopy/ [name] after me ."Benign inflammatory"  Coughs more with stress. She had gone to emergency room June 8 with anxiety and shortness of breath. We subsequently called in prednisone taper. She denies fever, sweat, adenopathy or chest pain. Often short of breath if anxious. She avoids exertion such as climbing stairs. Prednisone helped cough more productive and she feels better having taken it. Phlegm now is yellow, sometimes darker with no blood. She says that "a bug bite escalates my anxiety". Lab-eosinophils 8%, always high. CXR 10/06/11 IMPRESSION:  No acute infiltrate or pulmonary edema. There is a cavitary lesion  in the left lower lobe retrocardiac region measures at least 2 cm.  Further evaluation with enhanced CT of the chest is recommended.  Original Report Authenticated By: Lahoma Crocker, M.D.    12/25/11-  4 yoF never smoker with hx of asthma, left hilar mass/ PET neg in 2008/ now progressive, RML atelectasis. PET neg 2012.  States breathing has gotten better since last ov; review CT with patient in more detail Off prednisone since July 26. Did not need Symbicort. Cough produces some light yellow sputum but she denies fever, sweat, chest pain. She wakes coughing occasionally. CT chest 12/25/11- images reviewed with her: IMPRESSION:  Interval decrease in size of the left infrahilar lesion which has  become air-filled in the interval. It is possible that this could  represent some central cystic bronchiectasis or decompression of  the previous fluid density nodule.  Interval re-expansion of the right middle lobe.  No new or progressive findings.  Original Report  Authenticated By: [name], M.D.   06/24/12- 33 yoF never smoker with hx of asthma, left hilar mass/ PET neg in 2008/ then progressive, RML atelectasis. PET neg 2012.  FOLLOWS FOR: was doing pretty good until today-started having increased SOB ; unsure if wheezing. Family here With persistent questioning we determined that for the past month she had been noticing orthopnea, propping up to sleep. No palpitation ankle edema. Over the last 3 or 4 days she began noting dry throat and some mild low back ache. She had had diarrhea for a few days after church parking in mid February but that had resolved except for some residual urgency. No fever, chills. Sputum usually white, rarely brown. No chest pain, blood or swollen glands  07/08/12 71 yoF never smoker with hx of asthma, left hilar mass/ PET neg in 2008/ then progressive, RML atelectasis. PET neg 2012.    Son here FOLLOWS FOR: states breathing is slightly better since 2 weeks ago; ROOM AIR O2 of 90% upon arrival She feels she is doing better she comes in looking well dressed, stronger and comfortable occasional productive cough, scant clear sputum or little yellow-brown. No blood. Denies chest pain, fever or sweat. She is carrying her oxygen. Labs 06/24/12- CBC normaal except eosinophilia 9%. LFTs normal. Chemistry normal except CO2 34 suggesting compensation for respiratory acidosis. CT chest 06/26/12- images were reviewed with her and her son. IMPRESSION:  1. No evidence for acute pulmonary embolus.  2. Ground-glass attenuation and peripheral nodular consolidation  within the right lower lobe consistent with pneumonitis. Findings  likely sequela of aspiration and/or pneumonia.  3. Cavitary lesion versus cystic bronchiectasis within the  perihilar left lower lobe. That this is unchanged in size from  previous exam but now contains a central soft tissue attenuating  filling  defect which may represent a mycetoma.  4. Diffuse, bilateral bronchial  wall thickening which is favored  to represent the sequela of chronic aspiration and bronchitis.  Original Report Authenticated By: Kerby Moors, M.D.  08/12/12- 63 yoF never smoker with hx of asthma, left hilar mass/ PET neg in 2008/ then progressive, RML atelectasis. PET neg 2012.    Son here FOLLOWS FOR: review Barium swallow results in detail with patient; having SOB and wheezing-worse with activity She reports feeling much better compared with last winter. Still some cough and shortness of breath white or slightly brown sputum. Dyspnea with vigorous exertion/stairs. Denies sweats or fever, chest pain or palpitation. Steroids make her feel better. Sleeps with O2 2L/ [facility]. Barium swallow 07/15/12- negative for evidence of reflux or aspiration.  09/19/12- 2 yoF never smoker with hx of asthma/ bronchiectasis, left hilar mass/cyst PET neg in 2008/ then progressive, RML atelectasis. PET neg 2012.     FOLLOWS FOR: dry nasal area, watery eyes, cough that just started today. Strong perfume made her nose run. Eyes fdry CXR 08/22/12 IMPRESSION:  No edema or consolidation. The lesions seen recently on CT in the  left infrahilar region is not seen on chest radiography. In this  regard, suggest correlation with chest CT to further assess this  area on the left.  Original Report Authenticated By: Lowella Grip, M.D.  07/08/14- 19 yoF never smoker with hx of asthma/ bronchiectasis, left hilar mass/cyst PET neg in 2008/ then progressive, RML atelectasis. PET neg 2012. Female companion here FOLLOWS FOR/ACUTE VISIT: Last seen 2014; having increased mucus-hard to get up; SOB and wheezing-worse with activity. No fevers however slight warmness at times. Waking more frequently with cough and choking that sounds like reflux events. Difficult for her to cough out phlegm. Denies fever or sweat. Admits shortness of breath particularly with exertion. Sleeps with head up on pillows. CT chest 06/26/12- reviewed with  her IMPRESSION: 1. No evidence for acute pulmonary embolus. 2. Ground-glass attenuation and peripheral nodular consolidation within the right lower lobe consistent with pneumonitis. Findings likely sequela of aspiration and/or pneumonia. 3. Cavitary lesion versus cystic bronchiectasis within the perihilar left lower lobe. That this is unchanged in size from previous exam but now contains a central soft tissue attenuating filling defect which may represent a mycetoma. 4. Diffuse, bilateral bronchial wall thickening which is favored to represent the sequela of chronic aspiration and bronchitis. Original Report Authenticated By: Kerby Moors, M.D. CXR 02/18/14 IMPRESSION: Nodular opacities and persistent collapse of right middle lung. Suggest CT to exclude neoplasm. Diffuse emphysematous changes in the lungs. Electronically Signed  By: Lucienne Capers M.D.  On: 02/18/2014 04:51 Office spirometry 07/08/2014 Pre and postbronchodilator: Very severe obstructive airways disease with severe restriction of FVC and insignificant response to bronchodilator. FVC 0.90/41%, FEV1 0.44/27%, FEV1/FVC 0.49  09/06/14- 73 yoF never smoker with hx of asthma/ bronchiectasis, retained secretions, left hilar mass/cyst PET neg in 2008/ then progressive, RML atelectasis. PET neg 2012   husband here FOLLOWS FOR: was seen at [facility] ED 5/2 last week for bronchospasms.  pt today c/o prod cough with lots of yellow mucus. ER gave nebulizer treatment, prednisone and Z-Pak.  Got ? Chest cold. CXR 08/29/14- My review> note atelctatic areas have cleared  IMPRESSION: Hyperinflated lungs with chronic bronchitic markings. No acute findings. Electronically Signed  By: Suzy Bouchard M.D.  On: 08/29/2014 22:00  ROS-see HPI Constitutional:   No-   weight loss, night sweats, fevers, chills, fatigue, lassitude. HEENT:   No-  headaches, difficulty swallowing, tooth/dental problems, sore throat,       No-  sneezing,  itching, ear ache, nasal congestion, post nasal drip,  CV:  No-   chest pain, +orthopnea, no-PND, swelling in lower extremities, anasarca,  dizziness, palpitations Resp: +   shortness of breath with exertion or at rest.              +  productive cough,  + non-productive cough,  No- coughing up of blood.              +-change in color of mucus.  No- wheezing.   Skin: No-   rash or lesions. GI:  No-   heartburn, indigestion, abdominal pain, nausea, vomiting,  GU: . MS:  No-   joint pain or swelling.   Neuro-     nothing unusual Psych:  No- change in mood or affect. No depression or anxiety.  No memory loss.  OBJ- Physical Exam General- Alert, Oriented, Affect-appropriate, Distress- none acute. Thin Skin- rash-none, lesions- none, excoriation- none   Lymphadenopathy- none Head- atraumatic            Eyes- Gross vision intact, PERRLA, conjunctivae and secretions clear            Ears- +somewhat hard of hearing            Nose- Clear, no-Septal dev, mucus, polyps, erosion, perforation             Throat- Mallampati II , mucosa clear , drainage- none, tonsils- atrophic Neck- flexible , trachea midline, no stridor , thyroid nl, carotid no bruit Chest - symmetrical excursion , unlabored           Heart/CV- RRR , no murmur , no gallop  , no rub, nl s1 s2                           - JVD- none , edema- none, stasis changes- none, varices- none           Lung- + wheeze , unlabored, cough + deep bronchitic, dullness-none, rub- none           Chest wall-  Abd- Br/ Gen/ Rectal- Not done, not indicated Extrem- cyanosis- none, clubbing, none, atrophy- none, strength- nl Neuro- grossly intact to observation

## 2014-09-08 NOTE — Assessment & Plan Note (Signed)
Pneumonia earlier this year, now cleared on chest x-ray but with significant acute bronchitis.

## 2014-09-08 NOTE — Assessment & Plan Note (Signed)
Acute exacerbation, possibly a viral bronchitis. Her hard coughing has cleared her airways of much of the evidence of thick retained secretions. We discussed useful versus exhausting cough. Plan-uses nebulizer at home 4 times daily until bronchitis clears. Cefdinir

## 2014-10-04 ENCOUNTER — Encounter (HOSPITAL_COMMUNITY)
Admission: RE | Admit: 2014-10-04 | Discharge: 2014-10-04 | Disposition: A | Discharge status: Home / Self Care | Payer: Commercial Managed Care - HMO | Source: Ambulatory Visit | Attending: Internal Medicine | Admitting: Internal Medicine

## 2014-10-04 ENCOUNTER — Encounter (HOSPITAL_COMMUNITY): Payer: Self-pay

## 2014-10-04 VITALS — BP 122/66 | HR 78

## 2014-10-04 DIAGNOSIS — J441 Chronic obstructive pulmonary disease with (acute) exacerbation: Secondary | ICD-10-CM

## 2014-10-04 DIAGNOSIS — J479 Bronchiectasis, uncomplicated: Secondary | ICD-10-CM

## 2014-10-04 NOTE — Progress Notes (Signed)
[name] 74 y.o. female Pulmonary Rehab Orientation Note Patient arrived today in [facility] for orientation to Pulmonary Rehab. She walked from [employer], she was tired upon arrival, but able to physically do so. She does not carry portable oxygen. Per pt, she uses oxygen intermittently.  I asked patient several times when she used oxygen and never got a clear answer.  From what I understood when she has a COPD flare up she uses her oxygen because she is more short of breath.  At home is when she uses oxygen the most.   Color good, skin warm and dry. Patient is oriented to time and place. Patient's medical history and medications reviewed. Heart rate is normal, breath sounds clear to auscultation, rales, or rhonchi,with mild inspiratory wheezing heard LUL and LLL.  Patient had difficulty breathing and sleeping last night and took a nebulizer treatment in the early morning hours.  We discussed that she can take a nebulizer treatment every 4 hours which she was unaware of.  She will treat with albuterol today and if not better tomorrow she will call Dr. Annamaria Boots for an appointment to hopefully remedy a COPD flare.  Patient agreed.    Grip strength equal, strong. Distal pulses 3+ bilateral posterior tibial pulses present.  . Patient reports she does take medications as prescribed. Patient states she follows a Regular diet. The patient has been trying to gain weight by eating more frequently.. She lost down to 110 pounds when she was hospitalized in May of 2016 and has actually gained 6 pounds since then.  Patient's weight will be monitored closely. Demonstration and practice of PLB using pulse oximeter. Patient able to return demonstration satisfactorily. Safety and hand hygiene in the exercise area reviewed with patient. Patient voices understanding of the information reviewed. Department expectations discussed with patient and achievable goals were set. The patient shows enthusiasm about  attending the program and we look forward to working with this nice lady. The patient is scheduled for a 6 min walk test on Thursday, October 07, 2014 @ 3:45pm and to begin exercise on Tuesday, October 12, 2014, she is still undecided about which class she wishes to attend.   [name]

## 2014-10-04 NOTE — Progress Notes (Signed)
[name] 74 y.o. female  Initial Psychosocial Assessment  Pt psychosocial assessment reveals pt lives with their spouse. Pt is currently retired. Pt hobbies include sewing and reading, but patient has not had the desire to participate in her hobbies due to lack of interest. Pt reports her stress level is moderate due to not being able to be as independent as she has been in the past. Areas of stress/anxiety include her  Health.  Pt does exhibit signs of depression. Signs of depression include helplessness, hopelessness and loss of appetite, and wakes from sleep several days per week and difficulty maintaining sleep. Pt shows poor coping skills with negative outlook . Offered emotional support and reassurance. Monitor and evaluate progress toward psychosocial goal(s).  I discussed these symptoms of depression with patient and she has an appointment with Dr. Alain Marion, her primary care physician soon and she will discuss with him at that point.    Goal(s): Improved management of stress,depression, anxiety Improved coping skills Help patient work toward returning to meaningful activities that improve patient's QOL and are attainable with patient's lung disease Improve interest in activities she use to enjoy by controlling her depression  10/04/2014 2:44 PM

## 2014-10-05 ENCOUNTER — Encounter: Payer: Self-pay | Admitting: Internal Medicine

## 2014-10-05 ENCOUNTER — Telehealth (HOSPITAL_COMMUNITY): Payer: Self-pay | Admitting: *Deleted

## 2014-10-05 ENCOUNTER — Telehealth: Payer: Self-pay | Admitting: Internal Medicine

## 2014-10-05 ENCOUNTER — Ambulatory Visit (INDEPENDENT_AMBULATORY_CARE_PROVIDER_SITE_OTHER): Payer: Commercial Managed Care - HMO | Admitting: Internal Medicine

## 2014-10-05 ENCOUNTER — Telehealth: Payer: Self-pay | Admitting: *Deleted

## 2014-10-05 VITALS — BP 100/62 | HR 95 | Wt 118.0 lb

## 2014-10-05 DIAGNOSIS — F4321 Adjustment disorder with depressed mood: Secondary | ICD-10-CM | POA: Diagnosis not present

## 2014-10-05 DIAGNOSIS — J4550 Severe persistent asthma, uncomplicated: Secondary | ICD-10-CM

## 2014-10-05 DIAGNOSIS — F329 Major depressive disorder, single episode, unspecified: Secondary | ICD-10-CM | POA: Insufficient documentation

## 2014-10-05 DIAGNOSIS — I10 Essential (primary) hypertension: Secondary | ICD-10-CM | POA: Diagnosis not present

## 2014-10-05 DIAGNOSIS — J470 Bronchiectasis with acute lower respiratory infection: Secondary | ICD-10-CM | POA: Diagnosis not present

## 2014-10-05 MED ORDER — ALBUTEROL SULFATE (2.5 MG/3ML) 0.083% IN NEBU: 2.5000 mg | INHALATION_SOLUTION | RESPIRATORY_TRACT | Status: DC | PRN

## 2014-10-05 MED ORDER — DOXYCYCLINE HYCLATE 100 MG PO TABS: ORAL_TABLET | ORAL | Status: DC

## 2014-10-05 MED ORDER — MIRTAZAPINE 15 MG PO TABS: 15.0000 mg | ORAL_TABLET | Freq: Every day | ORAL | Status: DC

## 2014-10-05 MED ORDER — PREDNISONE 20 MG PO TABS: 20.0000 mg | ORAL_TABLET | Freq: Every day | ORAL | Status: DC

## 2014-10-05 NOTE — Telephone Encounter (Signed)
Called and spoke to pt. Informed her of the recs per CY. Rx sent to preferred pharmacy, along with albuterol neb. Pt verbalized understanding and denied any further questions or concerns at this time.

## 2014-10-05 NOTE — Telephone Encounter (Signed)
Rec call from [place], RN @ [facility]. Mrs. Gwynne was recently in for orientation. Caller states pt discussed some depression, not sleeping/eating well. Caller requesting me to contact pt and schedule OV with PCP to further address these issues with pt. I called pt- OV scheduled today at 3:45.

## 2014-10-05 NOTE — Telephone Encounter (Signed)
Offer doxycycline 100mg , # 8, 2 today then one daily          Prednisone 20 mg, 1 daily x 3 days, # 3

## 2014-10-05 NOTE — Telephone Encounter (Signed)
Pt calling back. She is wheezing & coughing at night. Not able to sleep. Wants appt with CY. He has openings Thursday afternoon but she doesn't feel like she can wait that long.

## 2014-10-05 NOTE — Progress Notes (Signed)
Pre visit review using our clinic review tool, if applicable. No additional management support is needed unless otherwise documented below in the visit note. 

## 2014-10-05 NOTE — Progress Notes (Signed)
   Subjective:    HPI  The patient presents for a follow-up of  Chronic asthma/COPD. C/o depression, insomnia. C/o wt loss. On O2 prn; in pulmonary rehab  BP Readings from Last 3 Encounters:  10/05/14 100/62  09/06/14 142/68  08/30/14 125/62   Wt Readings from Last 3 Encounters:  10/05/14 118 lb (53.524 kg)  09/06/14 113 lb (51.256 kg)  08/29/14 113 lb 4.8 oz (51.393 kg)      Review of Systems  Constitutional: Positive for fatigue. Negative for activity change, appetite change and unexpected weight change.  HENT: Negative for mouth sores and sinus pressure.   Eyes: Negative for visual disturbance.  Respiratory: Positive for choking. Negative for chest tightness and wheezing.   Cardiovascular: Negative for chest pain, palpitations and leg swelling.  Genitourinary: Negative for urgency, frequency, difficulty urinating and vaginal pain.  Musculoskeletal: Negative for back pain and gait problem.  Skin: Negative for pallor.  Neurological: Negative for dizziness, tremors, weakness and numbness.  Psychiatric/Behavioral: Negative for confusion and sleep disturbance. The patient is not nervous/anxious.        Objective:   Physical Exam  Constitutional: She appears well-developed. No distress.  HENT:  Head: Normocephalic.  Right Ear: External ear normal.  Left Ear: External ear normal.  Nose: Nose normal.  Mouth/Throat: Oropharynx is clear and moist.  Eyes: Conjunctivae are normal. Pupils are equal, round, and reactive to light. Right eye exhibits no discharge. Left eye exhibits no discharge.  Neck: Normal range of motion. Neck supple. No JVD present. No tracheal deviation present. No thyromegaly present.  Cardiovascular: Normal rate, regular rhythm and normal heart sounds.   Pulmonary/Chest: No stridor. No respiratory distress. She has no wheezes.  Abdominal: Soft. Bowel sounds are normal. She exhibits no distension and no mass. There is no tenderness. There is no rebound and  no guarding.  Musculoskeletal: She exhibits no edema or tenderness.  Lymphadenopathy:    She has no cervical adenopathy.  Neurological: She displays normal reflexes. No cranial nerve deficit. She exhibits normal muscle tone. Coordination normal.  Skin: No rash noted. No erythema.  Psychiatric: She has a normal mood and affect. Her behavior is normal. Judgment and thought content normal.  Not suicidal Mild rhonchi  Lab Results  Component Value Date   WBC 8.4 08/29/2014   HGB 17.3* 08/29/2014   HCT 51.0* 08/29/2014   PLT 308 08/29/2014   GLUCOSE 116* 08/29/2014   CHOL 227* 04/19/2014   TRIG 64.0 04/19/2014   HDL 58.50 04/19/2014   LDLDIRECT 138.6 01/19/2010   LDLCALC 156* 04/19/2014   ALT 12 04/19/2014   AST 22 04/19/2014   NA 140 08/29/2014   K 4.3 08/29/2014   CL 99* 08/29/2014   CREATININE 1.00 08/29/2014   BUN 14 08/29/2014   CO2 34* 07/09/2014   TSH 1.04 04/19/2014   INR 1.02 01/17/2011   HGBA1C * 10/31/2008    6.2 (NOTE) The ADA recommends the following therapeutic goal for glycemic control related to Hgb A1c measurement: Goal of therapy: <6.5 Hgb A1c  Reference: American Diabetes Association: Clinical Practice Recommendations 2010, Diabetes Care, 2010, 33: (Suppl  1).          Assessment & Plan:

## 2014-10-05 NOTE — Assessment & Plan Note (Addendum)
Start Remeron Psych ref to [facility]

## 2014-10-05 NOTE — Telephone Encounter (Signed)
Called and spoke to pt. Pt c/o increase in SOB, wheezing, lacking sleep, prod cough with clear and light yellow mucus, chills without fever, increase use of albuterol neb x 3 days. Pt denies CP/tightness. Pt requesting recs by CY.   Dr. Annamaria Boots please advise.   Allergies  Allergen Reactions  . Penicillins Swelling    Throat swelling  . Shellfish Allergy Anaphylaxis  . Sulfonamide Derivatives Swelling    Throat swelling  . Aspirin Other (See Comments)    States stomach bubbles, becomes gaseous and irritated  . Azithromycin     "makes me gag"  . Bee Venom   . Clindamycin     REACTION: Neck, tongue swelling, SOB \\T \ rash  . Fluticasone-Salmeterol     REACTION: hoarseness  . Fruit & Vegetable Daily [Nutritional Supplements]     Tongue swelling, vomiting  . Latex Itching and Swelling  . Montelukast Sodium     REACTION: hallucination  . Ventolin [Albuterol]     cough    Current Outpatient Prescriptions on File Prior to Visit  Medication Sig Dispense Refill  . acetaminophen (TYLENOL) 325 MG tablet Take 325 mg by mouth every 6 (six) hours as needed. For pain    . albuterol (PROAIR HFA) 108 (90 BASE) MCG/ACT inhaler Inhale 2 puffs into the lungs every 4 (four) hours as needed for wheezing or shortness of breath. For shortness of breath 1 Inhaler 0  . albuterol (PROVENTIL) (2.5 MG/3ML) 0.083% nebulizer solution Take 3 mLs (2.5 mg total) by nebulization every 4 (four) hours as needed for wheezing or shortness of breath. 75 mL 0  . Ascorbic Acid (VITAMIN C PO) Take 1 tablet by mouth daily.     . B Complex Vitamins (VITAMIN B COMPLEX PO) Take 1 tablet by mouth daily as needed (takes when she remembers to take it).     . budesonide-formoterol (SYMBICORT) 160-4.5 MCG/ACT inhaler Inhale 1 puff into the lungs 2 (two) times daily.    . cefdinir (OMNICEF) 300 MG capsule Take 1 capsule (300 mg total) by mouth 2 (two) times daily. (Patient not taking: Reported on 10/04/2014) 14 capsule 0  .  cholecalciferol (VITAMIN D) 1000 UNITS tablet Take 1,000 Units by mouth daily.    Marland Kitchen Dextromethorphan-Guaifenesin (MUCINEX DM PO) Take 1 tablet by mouth daily as needed (for congestion).     Marland Kitchen diltiazem (CARDIZEM) 120 MG tablet take 1 tablet by mouth twice a day 60 tablet 5  . diphenhydrAMINE (BENADRYL) 25 MG tablet Take 25 mg by mouth every 6 (six) hours as needed for allergies.    Marland Kitchen loratadine (CLARITIN) 10 MG tablet Take 10 mg by mouth daily as needed for allergies.     . Multiple Vitamin (MULTIVITAMIN) tablet Take 1 tablet by mouth daily.      . promethazine-codeine (PHENERGAN WITH CODEINE) 6.25-10 MG/5ML syrup Take 5 mLs by mouth every 6 (six) hours as needed for cough. 300 mL 0  . Respiratory Therapy Supplies (FLUTTER) DEVI Blow through 4 times per set and repeat 3 sets per day, to loosen lung secretions. 1 each 0   No current facility-administered medications on file prior to visit.

## 2014-10-06 ENCOUNTER — Telehealth: Payer: Self-pay | Admitting: Internal Medicine

## 2014-10-06 MED ORDER — CIPROFLOXACIN HCL 500 MG PO TABS: 500.0000 mg | ORAL_TABLET | Freq: Two times a day (BID) | ORAL | Status: DC

## 2014-10-06 NOTE — Telephone Encounter (Signed)
Send script for cipro 500 mg bid, dispense 10 tablets with no refills.

## 2014-10-06 NOTE — Telephone Encounter (Signed)
Patient notified. Rx sent to pharmacy. Nothing further needed.  

## 2014-10-06 NOTE — Telephone Encounter (Signed)
Spoke with pt, states she started taking doxycycline yesterday (this was the only med she had taken), noted increased sob.  Pt used the nebulizer which helped to catch her breath.  Pt also had increase in nonprod cough.  Today after d/c'ing med pt states she is feeling better but still notes chest heaviness.   I advised pt to not continue taking this medication. She is requesting an alternative med.  Sending to doc of day as CY is unavailable this afternoon.  VS please advise.  Thanks!

## 2014-10-07 ENCOUNTER — Ambulatory Visit (HOSPITAL_COMMUNITY): Payer: Commercial Managed Care - HMO

## 2014-10-10 NOTE — Assessment & Plan Note (Signed)
Chronic On Diltiazem

## 2014-10-10 NOTE — Assessment & Plan Note (Signed)
On Cipro 

## 2014-10-10 NOTE — Assessment & Plan Note (Addendum)
On Prednisone, Symbicort

## 2014-10-12 ENCOUNTER — Ambulatory Visit (HOSPITAL_COMMUNITY): Payer: Commercial Managed Care - HMO

## 2014-10-12 ENCOUNTER — Ambulatory Visit (INDEPENDENT_AMBULATORY_CARE_PROVIDER_SITE_OTHER): Payer: Commercial Managed Care - HMO | Admitting: Internal Medicine

## 2014-10-12 ENCOUNTER — Ambulatory Visit (INDEPENDENT_AMBULATORY_CARE_PROVIDER_SITE_OTHER)
Admission: RE | Admit: 2014-10-12 | Discharge: 2014-10-12 | Disposition: A | Discharge status: Home / Self Care | Payer: Commercial Managed Care - HMO | Source: Ambulatory Visit | Attending: Internal Medicine | Admitting: Internal Medicine

## 2014-10-12 ENCOUNTER — Telehealth: Payer: Self-pay | Admitting: Internal Medicine

## 2014-10-12 ENCOUNTER — Encounter: Payer: Self-pay | Admitting: Internal Medicine

## 2014-10-12 VITALS — BP 158/88 | HR 93 | Ht 62.0 in

## 2014-10-12 DIAGNOSIS — J4531 Mild persistent asthma with (acute) exacerbation: Secondary | ICD-10-CM

## 2014-10-12 DIAGNOSIS — J479 Bronchiectasis, uncomplicated: Secondary | ICD-10-CM

## 2014-10-12 DIAGNOSIS — J4521 Mild intermittent asthma with (acute) exacerbation: Secondary | ICD-10-CM

## 2014-10-12 MED ORDER — METHYLPREDNISOLONE ACETATE 80 MG/ML IJ SUSP
80.0000 mg | Freq: Once | INTRAMUSCULAR | Status: AC
  Administered 2014-10-12: 80 mg via INTRAMUSCULAR

## 2014-10-12 MED ORDER — PREDNISONE 10 MG PO TABS
ORAL_TABLET | ORAL | Status: DC
Start: 2014-10-12 — End: 2015-01-06

## 2014-10-12 MED ORDER — LEVALBUTEROL HCL 0.63 MG/3ML IN NEBU
0.6300 mg | INHALATION_SOLUTION | Freq: Once | RESPIRATORY_TRACT | Status: AC
Start: 2014-10-12 — End: 2014-10-12
  Administered 2014-10-12: 0.63 mg via RESPIRATORY_TRACT

## 2014-10-12 NOTE — Patient Instructions (Addendum)
Xop 0.63  Depo 77  Order- CXR    Dx asthma with bronchitis acute exacerbation  Script sent for prednisone to take a taper and then stay on one every other day  If you get worse today, go to ER  Go back to using the Symbicort 2 puffs, then rinse mouth, twice daily      You can still use the nebulizer machine if needed.  Keep the July 28 appointment  but call sooner as needed

## 2014-10-12 NOTE — Telephone Encounter (Signed)
Called pt. She reports she has had 10 neb tx's since yesterday. She is still feeling SOB and wheezing. appt scheduled to see CDY today at 11:!5. Nothing further needed

## 2014-10-12 NOTE — Assessment & Plan Note (Signed)
Significant acute exacerbation, undefined cause. We don't identify an obvious aspiration event from history Plan-Depo-Medrol, nebulizer treatments Xopenex, prednisone taper, chest x-ray

## 2014-10-12 NOTE — Assessment & Plan Note (Signed)
This acute presentation is unlike her more chronic problems with bronchiectasis and retained secretions and may represent a different process.

## 2014-10-12 NOTE — Progress Notes (Signed)
Subjective:    Patient ID: [name], female    DOB: 19-May-1940, 74 y.o.   MRN: [id number]  HPI 10/04/10-69 yoF never smoker with hx of asthma, left hilar mass/ PET negative.. Last here in 2008 after hospital stay, when she saw [name] noting issues of hypertension and asthma meds. Notes reviewed. Seen now on referral by [name] after recent flare with shortness of breath, productive cough. She tends to feel better as long as she is on an antibiotic or steroids. Within a couple of weeks off, she will start to cough and get tight again. Had bronchitis as a child. In the last 5-6 years wheezing dyspnea has been more perennial. Occasionally wakes with cough. Sputum often yellow. Has had pneumonia x 2 with pneumovax. Little GERD or acid indigestion recognized. Triggers have included strong odors, seasonal pollens, chest colds, but not house dust. Had a sinus infection during the winter. Has been treated for heart rhythm and BP, but denies MI/ angina. She is using Symbicort 160 now and considers it sufficient used twice every day. She did not fill a script for a Proair rescue inhaler. 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  11/10/10- 69 yoF never smoker with hx of asthma, left hilar mass/ PET neg in 2008/ now progressive, RML atelectasis. CT 11/03/10- showed change progression since prior studies of 2008, when abnormalities / PET negative, were attributed to scarring. -Now 2.6 x 2,2 cm left hilar mass, slowly growing. Post obstructive LLL opacity. New RML collapse. Scattered bronchiectasis. No effusion. Denies fever, night sweat, nodes. Cough has been a little less, with brownish to clear mucus, no blood. Has to chew deliberately to avoid choking. Has rarely awakened with hard cough. Symbicort has helped cough. No weight loss. No chest pain, but some shifting back pain. We reviewed her images and the technique and risks of bronchoscopy.   12/12/10- 69 yoF never smoker with hx of asthma, left hilar mass/ PET  neg in 2008/ now progressive, RML atelectasis. PET neg 2012. Husband here  Post bronchoscopy f/u - Bronch 11/30/10- necrotic appearing mass occluding left lower lobe bronchus.  Path- small lymphocytes and eosinophils, neg for malignancy or granuloma. Cultures negative so far.  She still coughs up phlegm, but less than before bronchoscopy.  Aware of a variable nodule in left axilla- comes and goes, otherwise no lumps, nodes or etc. Occasional sweat on morning waking, otherwise no fever or sweat and no pain or blood. She has been aware "something is going on" but not toxic.   11/23/11-  45 yoF never smoker with hx of asthma, left hilar mass/ PET neg in 2008/ now progressive, RML atelectasis. PET neg 2012.  She says she is doing "fair". She has had bronchoscopy/ [name] after me ."Benign inflammatory"  Coughs more with stress. She had gone to emergency room June 8 with anxiety and shortness of breath. We subsequently called in prednisone taper. She denies fever, sweat, adenopathy or chest pain. Often short of breath if anxious. She avoids exertion such as climbing stairs. Prednisone helped cough more productive and she feels better having taken it. Phlegm now is yellow, sometimes darker with no blood. She says that "a bug bite escalates my anxiety". Lab-eosinophils 8%, always high. CXR 10/06/11 IMPRESSION:  No acute infiltrate or pulmonary edema. There is a cavitary lesion  in the left lower lobe retrocardiac region measures at least 2 cm.  Further evaluation with enhanced CT of the chest is recommended.  Original Report Authenticated By: [name]  POP, M.D.    12/25/11-  18 yoF never smoker with hx of asthma, left hilar mass/ PET neg in 2008/ now progressive, RML atelectasis. PET neg 2012.  States breathing has gotten better since last ov; review CT with patient in more detail Off prednisone since July 26. Did not need Symbicort. Cough produces some light yellow sputum but she denies fever, sweat, chest  pain. She wakes coughing occasionally. CT chest 12/25/11- images reviewed with her: IMPRESSION:  Interval decrease in size of the left infrahilar lesion which has  become air-filled in the interval. It is possible that this could  represent some central cystic bronchiectasis or decompression of  the previous fluid density nodule.  Interval re-expansion of the right middle lobe.  No new or progressive findings.  Original Report Authenticated By: [name], M.D.   06/24/12- 61 yoF never smoker with hx of asthma, left hilar mass/ PET neg in 2008/ then progressive, RML atelectasis. PET neg 2012.  FOLLOWS FOR: was doing pretty good until today-started having increased SOB ; unsure if wheezing. Family here With persistent questioning we determined that for the past month she had been noticing orthopnea, propping up to sleep. No palpitation ankle edema. Over the last 3 or 4 days she began noting dry throat and some mild low back ache. She had had diarrhea for a few days after church parking in mid February but that had resolved except for some residual urgency. No fever, chills. Sputum usually white, rarely brown. No chest pain, blood or swollen glands  07/08/12 71 yoF never smoker with hx of asthma, left hilar mass/ PET neg in 2008/ then progressive, RML atelectasis. PET neg 2012.    Son here FOLLOWS FOR: states breathing is slightly better since 2 weeks ago; ROOM AIR O2 of 90% upon arrival She feels she is doing better she comes in looking well dressed, stronger and comfortable occasional productive cough, scant clear sputum or little yellow-brown. No blood. Denies chest pain, fever or sweat. She is carrying her oxygen. Labs 06/24/12- CBC normaal except eosinophilia 9%. LFTs normal. Chemistry normal except CO2 34 suggesting compensation for respiratory acidosis. CT chest 06/26/12- images were reviewed with her and her son. IMPRESSION:  1. No evidence for acute pulmonary embolus.  2. Ground-glass  attenuation and peripheral nodular consolidation  within the right lower lobe consistent with pneumonitis. Findings  likely sequela of aspiration and/or pneumonia.  3. Cavitary lesion versus cystic bronchiectasis within the  perihilar left lower lobe. That this is unchanged in size from  previous exam but now contains a central soft tissue attenuating  filling defect which may represent a mycetoma.  4. Diffuse, bilateral bronchial wall thickening which is favored  to represent the sequela of chronic aspiration and bronchitis.  Original Report Authenticated By: Kerby Moors, M.D.  08/12/12- 89 yoF never smoker with hx of asthma, left hilar mass/ PET neg in 2008/ then progressive, RML atelectasis. PET neg 2012.    Son here FOLLOWS FOR: review Barium swallow results in detail with patient; having SOB and wheezing-worse with activity She reports feeling much better compared with last winter. Still some cough and shortness of breath white or slightly brown sputum. Dyspnea with vigorous exertion/stairs. Denies sweats or fever, chest pain or palpitation. Steroids make her feel better. Sleeps with O2 2L/ [facility]. Barium swallow 07/15/12- negative for evidence of reflux or aspiration.  09/19/12- 53 yoF never smoker with hx of asthma/ bronchiectasis, left hilar mass/cyst PET neg in 2008/ then progressive, RML  atelectasis. PET neg 2012.     FOLLOWS FOR: dry nasal area, watery eyes, cough that just started today. Strong perfume made her nose run. Eyes fdry CXR 08/22/12 IMPRESSION:  No edema or consolidation. The lesions seen recently on CT in the  left infrahilar region is not seen on chest radiography. In this  regard, suggest correlation with chest CT to further assess this  area on the left.  Original Report Authenticated By: Lowella Grip, M.D.  07/08/14- 58 yoF never smoker with hx of asthma/ bronchiectasis, left hilar mass/cyst PET neg in 2008/ then progressive, RML atelectasis. PET neg 2012.  Female companion here FOLLOWS FOR/ACUTE VISIT: Last seen 2014; having increased mucus-hard to get up; SOB and wheezing-worse with activity. No fevers however slight warmness at times. Waking more frequently with cough and choking that sounds like reflux events. Difficult for her to cough out phlegm. Denies fever or sweat. Admits shortness of breath particularly with exertion. Sleeps with head up on pillows. CT chest 06/26/12- reviewed with her IMPRESSION: 1. No evidence for acute pulmonary embolus. 2. Ground-glass attenuation and peripheral nodular consolidation within the right lower lobe consistent with pneumonitis. Findings likely sequela of aspiration and/or pneumonia. 3. Cavitary lesion versus cystic bronchiectasis within the perihilar left lower lobe. That this is unchanged in size from previous exam but now contains a central soft tissue attenuating filling defect which may represent a mycetoma. 4. Diffuse, bilateral bronchial wall thickening which is favored to represent the sequela of chronic aspiration and bronchitis. Original Report Authenticated By: Kerby Moors, M.D. CXR 02/18/14 IMPRESSION: Nodular opacities and persistent collapse of right middle lung. Suggest CT to exclude neoplasm. Diffuse emphysematous changes in the lungs. Electronically Signed  By: Lucienne Capers M.D.  On: 02/18/2014 04:51 Office spirometry 07/08/2014 Pre and postbronchodilator: Very severe obstructive airways disease with severe restriction of FVC and insignificant response to bronchodilator. FVC 0.90/41%, FEV1 0.44/27%, FEV1/FVC 0.49  ROS-see HPI Constitutional:   No-   weight loss, night sweats, fevers, chills, fatigue, lassitude. HEENT:   No-  headaches, difficulty swallowing, tooth/dental problems, sore throat,       No-  sneezing, itching, ear ache, nasal congestion, post nasal drip,  CV:  No-   chest pain, +orthopnea, no-PND, swelling in lower extremities, anasarca,  dizziness,  palpitations Resp: +   shortness of breath with exertion or at rest.              +  productive cough,  + non-productive cough,  No- coughing up of blood.              No-change in color of mucus.  No- wheezing.   Skin: No-   rash or lesions. GI:  No-   heartburn, indigestion, abdominal pain, nausea, vomiting,  GU: . MS:  No-   joint pain or swelling.   Neuro-     nothing unusual Psych:  No- change in mood or affect. No depression or anxiety.  No memory loss.  OBJ- Physical Exam General- Alert, Oriented, Affect-appropriate, Distress- none acute. Thin Skin- rash-none, lesions- none, excoriation- none   Lymphadenopathy- none Head- atraumatic            Eyes- Gross vision intact, PERRLA, conjunctivae and secretions clear            Ears- +somewhat hard of hearing            Nose- Clear, no-Septal dev, mucus, polyps, erosion, perforation  Throat- Mallampati II , mucosa clear , drainage- none, tonsils- atrophic Neck- flexible , trachea midline, no stridor , thyroid nl, carotid no bruit Chest - symmetrical excursion , unlabored           Heart/CV- RRR , no murmur , no gallop  , no rub, nl s1 s2                           - JVD- none , edema- none, stasis changes- none, varices- none           Lung- + wheeze and squeaks, unlabored, cough- none , dullness-none, rub- none           Chest wall-  Abd- Br/ Gen/ Rectal- Not done, not indicated Extrem- cyanosis- none, clubbing, none, atrophy- none, strength- nl Neuro- grossly intact to observation     Subjective:    Patient ID: [name], female    DOB: 1941-04-05, 73 y.o.   MRN: [id number]  HPI 10/04/10-69 yoF never smoker with hx of asthma, left hilar mass/ PET negative.. Last here in 2008 after hospital stay, when she saw [name] noting issues of hypertension and asthma meds. Notes reviewed. Seen now on referral by [name] after recent flare with shortness of breath, productive cough. She tends to feel better as long as she  is on an antibiotic or steroids. Within a couple of weeks off, she will start to cough and get tight again. Had bronchitis as a child. In the last 5-6 years wheezing dyspnea has been more perennial. Occasionally wakes with cough. Sputum often yellow. Has had pneumonia x 2 with pneumovax. Little GERD or acid indigestion recognized. Triggers have included strong odors, seasonal pollens, chest colds, but not house dust. Had a sinus infection during the winter. Has been treated for heart rhythm and BP, but denies MI/ angina. She is using Symbicort 160 now and considers it sufficient used twice every day. She did not fill a script for a Proair rescue inhaler. 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  11/10/10- 69 yoF never smoker with hx of asthma, left hilar mass/ PET neg in 2008/ now progressive, RML atelectasis. CT 11/03/10- showed change progression since prior studies of 2008, when abnormalities / PET negative, were attributed to scarring. -Now 2.6 x 2,2 cm left hilar mass, slowly growing. Post obstructive LLL opacity. New RML collapse. Scattered bronchiectasis. No effusion. Denies fever, night sweat, nodes. Cough has been a little less, with brownish to clear mucus, no blood. Has to chew deliberately to avoid choking. Has rarely awakened with hard cough. Symbicort has helped cough. No weight loss. No chest pain, but some shifting back pain. We reviewed her images and the technique and risks of bronchoscopy.   12/12/10- 69 yoF never smoker with hx of asthma, left hilar mass/ PET neg in 2008/ now progressive, RML atelectasis. PET neg 2012. Husband here  Post bronchoscopy f/u - Bronch 11/30/10- necrotic appearing mass occluding left lower lobe bronchus.  Path- small lymphocytes and eosinophils, neg for malignancy or granuloma. Cultures negative so far.  She still coughs up phlegm, but less than before bronchoscopy.  Aware of a variable nodule in left axilla- comes and goes, otherwise no lumps, nodes or etc. Occasional  sweat on morning waking, otherwise no fever or sweat and no pain or blood. She has been aware "something is going on" but not toxic.   11/23/11-  15 yoF never smoker with hx of asthma, left hilar  mass/ PET neg in 2008/ now progressive, RML atelectasis. PET neg 2012.  She says she is doing "fair". She has had bronchoscopy/ [name] after me ."Benign inflammatory"  Coughs more with stress. She had gone to emergency room June 8 with anxiety and shortness of breath. We subsequently called in prednisone taper. She denies fever, sweat, adenopathy or chest pain. Often short of breath if anxious. She avoids exertion such as climbing stairs. Prednisone helped cough more productive and she feels better having taken it. Phlegm now is yellow, sometimes darker with no blood. She says that "a bug bite escalates my anxiety". Lab-eosinophils 8%, always high. CXR 10/06/11 IMPRESSION:  No acute infiltrate or pulmonary edema. There is a cavitary lesion  in the left lower lobe retrocardiac region measures at least 2 cm.  Further evaluation with enhanced CT of the chest is recommended.  Original Report Authenticated By: Lahoma Crocker, M.D.    12/25/11-  46 yoF never smoker with hx of asthma, left hilar mass/ PET neg in 2008/ now progressive, RML atelectasis. PET neg 2012.  States breathing has gotten better since last ov; review CT with patient in more detail Off prednisone since July 26. Did not need Symbicort. Cough produces some light yellow sputum but she denies fever, sweat, chest pain. She wakes coughing occasionally. CT chest 12/25/11- images reviewed with her: IMPRESSION:  Interval decrease in size of the left infrahilar lesion which has  become air-filled in the interval. It is possible that this could  represent some central cystic bronchiectasis or decompression of  the previous fluid density nodule.  Interval re-expansion of the right middle lobe.  No new or progressive findings.  Original Report  Authenticated By: [name], M.D.   06/24/12- 69 yoF never smoker with hx of asthma, left hilar mass/ PET neg in 2008/ then progressive, RML atelectasis. PET neg 2012.  FOLLOWS FOR: was doing pretty good until today-started having increased SOB ; unsure if wheezing. Family here With persistent questioning we determined that for the past month she had been noticing orthopnea, propping up to sleep. No palpitation ankle edema. Over the last 3 or 4 days she began noting dry throat and some mild low back ache. She had had diarrhea for a few days after church parking in mid February but that had resolved except for some residual urgency. No fever, chills. Sputum usually white, rarely brown. No chest pain, blood or swollen glands  07/08/12 71 yoF never smoker with hx of asthma, left hilar mass/ PET neg in 2008/ then progressive, RML atelectasis. PET neg 2012.    Son here FOLLOWS FOR: states breathing is slightly better since 2 weeks ago; ROOM AIR O2 of 90% upon arrival She feels she is doing better she comes in looking well dressed, stronger and comfortable occasional productive cough, scant clear sputum or little yellow-brown. No blood. Denies chest pain, fever or sweat. She is carrying her oxygen. Labs 06/24/12- CBC normaal except eosinophilia 9%. LFTs normal. Chemistry normal except CO2 34 suggesting compensation for respiratory acidosis. CT chest 06/26/12- images were reviewed with her and her son. IMPRESSION:  1. No evidence for acute pulmonary embolus.  2. Ground-glass attenuation and peripheral nodular consolidation  within the right lower lobe consistent with pneumonitis. Findings  likely sequela of aspiration and/or pneumonia.  3. Cavitary lesion versus cystic bronchiectasis within the  perihilar left lower lobe. That this is unchanged in size from  previous exam but now contains a central soft tissue attenuating  filling  defect which may represent a mycetoma.  4. Diffuse, bilateral bronchial  wall thickening which is favored  to represent the sequela of chronic aspiration and bronchitis.  Original Report Authenticated By: Kerby Moors, M.D.  08/12/12- 9 yoF never smoker with hx of asthma, left hilar mass/ PET neg in 2008/ then progressive, RML atelectasis. PET neg 2012.    Son here FOLLOWS FOR: review Barium swallow results in detail with patient; having SOB and wheezing-worse with activity She reports feeling much better compared with last winter. Still some cough and shortness of breath white or slightly brown sputum. Dyspnea with vigorous exertion/stairs. Denies sweats or fever, chest pain or palpitation. Steroids make her feel better. Sleeps with O2 2L/ [facility]. Barium swallow 07/15/12- negative for evidence of reflux or aspiration.  09/19/12- 81 yoF never smoker with hx of asthma/ bronchiectasis, left hilar mass/cyst PET neg in 2008/ then progressive, RML atelectasis. PET neg 2012.     FOLLOWS FOR: dry nasal area, watery eyes, cough that just started today. Strong perfume made her nose run. Eyes fdry CXR 08/22/12 IMPRESSION:  No edema or consolidation. The lesions seen recently on CT in the  left infrahilar region is not seen on chest radiography. In this  regard, suggest correlation with chest CT to further assess this  area on the left.  Original Report Authenticated By: Lowella Grip, M.D.  07/08/14- 81 yoF never smoker with hx of asthma/ bronchiectasis, left hilar mass/cyst PET neg in 2008/ then progressive, RML atelectasis. PET neg 2012. Female companion here FOLLOWS FOR/ACUTE VISIT: Last seen 2014; having increased mucus-hard to get up; SOB and wheezing-worse with activity. No fevers however slight warmness at times. Waking more frequently with cough and choking that sounds like reflux events. Difficult for her to cough out phlegm. Denies fever or sweat. Admits shortness of breath particularly with exertion. Sleeps with head up on pillows. CT chest 06/26/12- reviewed with  her IMPRESSION: 1. No evidence for acute pulmonary embolus. 2. Ground-glass attenuation and peripheral nodular consolidation within the right lower lobe consistent with pneumonitis. Findings likely sequela of aspiration and/or pneumonia. 3. Cavitary lesion versus cystic bronchiectasis within the perihilar left lower lobe. That this is unchanged in size from previous exam but now contains a central soft tissue attenuating filling defect which may represent a mycetoma. 4. Diffuse, bilateral bronchial wall thickening which is favored to represent the sequela of chronic aspiration and bronchitis. Original Report Authenticated By: Kerby Moors, M.D. CXR 02/18/14 IMPRESSION: Nodular opacities and persistent collapse of right middle lung. Suggest CT to exclude neoplasm. Diffuse emphysematous changes in the lungs. Electronically Signed  By: Lucienne Capers M.D.  On: 02/18/2014 04:51 Office spirometry 07/08/2014 Pre and postbronchodilator: Very severe obstructive airways disease with severe restriction of FVC and insignificant response to bronchodilator. FVC 0.90/41%, FEV1 0.44/27%, FEV1/FVC 0.49  09/06/14- 73 yoF never smoker with hx of asthma/ bronchiectasis, retained secretions, left hilar mass/cyst PET neg in 2008/ then progressive, RML atelectasis. PET neg 2012   husband here FOLLOWS FOR: was seen at [facility] ED 5/2 last week for bronchospasms.  pt today c/o prod cough with lots of yellow mucus. ER gave nebulizer treatment, prednisone and Z-Pak.  Got ? Chest cold. CXR 08/29/14- My review> note atelctatic areas have cleared  IMPRESSION: Hyperinflated lungs with chronic bronchitic markings. No acute findings. Electronically Signed  By: Suzy Bouchard M.D.  On: 08/29/2014 22:00  10/12/14- 73 yoF never smoker with hx of asthma/ bronchiectasis, retained secretions, left hilar mass/cyst PET neg in 2008/ then progressive,  RML atelectasis. PET neg 2012   husband here ACUTE VISIT: Pt  called stating she has used her nebulizer about 10 times since last night without relief; comes and goes. Pt having increased SOB and wheezing,cough-small amounts of phelgm-beige in color. Pt was "hot" but never checked for fever. She had called [place] June 7 reporting 3 days of wheeze and malaise. We sent doxycycline and 3 days of prednisone. She has been using her nebulizer regularly and stopped Symbicort. As soon as she ran out of prednisone  wheezing got worse. No fever, chills, chest pain or purulent sputum and no sudden choking events. Using home oxygen at 2 L.  ROS-see HPI Constitutional:   No-   weight loss, night sweats, fevers, chills, fatigue, lassitude. HEENT:   No-  headaches, difficulty swallowing, tooth/dental problems, sore throat,       No-  sneezing, itching, ear ache, nasal congestion, post nasal drip,  CV:  No-   chest pain, +orthopnea, no-PND, swelling in lower extremities, anasarca,  dizziness, palpitations Resp: +   shortness of breath with exertion or at rest.             + productive cough,  + non-productive cough,  No- coughing up of blood.              +change in color of mucus. +wheezing.   Skin: No-   rash or lesions. GI:  No-   heartburn, indigestion, abdominal pain, nausea, vomiting,  GU: . MS:  No-   joint pain or swelling.   Neuro-     nothing unusual Psych:  No- change in mood or affect. No depression or anxiety.  No memory loss.  OBJ- Physical Exam General- Alert, Oriented, Affect-appropriate, Distress- none acute. Thin Skin- rash-none, lesions- none, excoriation- none   Lymphadenopathy- none Head- atraumatic            Eyes- Gross vision intact, PERRLA, conjunctivae and secretions clear            Ears- +somewhat hard of hearing            Nose- Clear, no-Septal dev, mucus, polyps, erosion, perforation             Throat- Mallampati II , mucosa clear , drainage- none, tonsils- atrophic Neck- flexible , trachea midline, no stridor , thyroid nl, carotid no  bruit Chest - symmetrical excursion , unlabored           Heart/CV- RRR , no murmur , no gallop  , no rub, nl s1 s2                           - JVD- none , edema- none, stasis changes- none, varices- none           Lung- + wheeze bilateral , +labored, cough -none, dullness-none, rub- none           Chest wall-  Abd- Br/ Gen/ Rectal- Not done, not indicated Extrem- cyanosis- none, clubbing, none, atrophy- none, strength- nl Neuro- grossly intact to observation

## 2014-10-13 NOTE — Progress Notes (Signed)
Quick Note:  Called and spoke to pt. Reviewed results and recs. Pt voiced understanding and had no further questions. ______ 

## 2014-10-18 ENCOUNTER — Telehealth (HOSPITAL_COMMUNITY): Payer: Self-pay | Admitting: *Deleted

## 2014-10-29 ENCOUNTER — Telehealth: Payer: Self-pay | Admitting: *Deleted

## 2014-10-29 DIAGNOSIS — Z1231 Encounter for screening mammogram for malignant neoplasm of breast: Secondary | ICD-10-CM

## 2014-10-29 NOTE — Telephone Encounter (Signed)
I called pt to see when her last mammogram was. She states her last mammogram was in 2008. She would like a order placed for mammogram. Order placed. Pt informed she will receive another call with appt info.

## 2014-11-04 ENCOUNTER — Encounter (HOSPITAL_COMMUNITY)
Admission: RE | Admit: 2014-11-04 | Discharge: 2014-11-04 | Disposition: A | Discharge status: Home / Self Care | Payer: Commercial Managed Care - HMO | Source: Ambulatory Visit | Attending: Internal Medicine | Admitting: Internal Medicine

## 2014-11-04 DIAGNOSIS — J449 Chronic obstructive pulmonary disease, unspecified: Secondary | ICD-10-CM | POA: Insufficient documentation

## 2014-11-04 DIAGNOSIS — J479 Bronchiectasis, uncomplicated: Secondary | ICD-10-CM | POA: Insufficient documentation

## 2014-11-04 NOTE — Progress Notes (Signed)
[name] completed a Six-Minute Walk Test on 11/04/14 . [name] walked 1087 feet with 0 breaks.  The patient's lowest oxygen saturation was 94 %, highest heart rate was 97 bpm , and highest blood pressure was 128/60. The patient was on room air. Patient stated that nothing hindered their walk test.

## 2014-11-04 NOTE — Outcomes Assessment (Signed)
[facility] Pulmonary Rehabilitation Baseline Outcomes Assessment   Anthropometrics:  . Height (inches): 63 . Weight (kg): 53.2 . Grip strength was measured using a Dynamometer.  The patient's highest score was a 28.  Functional Status/Exercise Capacity: Marland Kitchen Anna had a resting heart rate of 78 BPM, a resting blood pressure of 122/66, and an oxygen saturation of 92 % on room air.  [name] performed a 6-minute walk test on 11/04/14.  The patient completed 1087 feet in 6 minutes with 0 rest breaks.  This quantifies 2.53 METS.   Dyspnea Measures: . The [facility] is a simple and standardized method of classifying disability in patients with COPD.  The assessment correlates disability and dyspnea.  At entrance the patient scored a 1. The scale is provided below.   0= I only get breathless with strenuous exercise. 1= I get short of breath when hurrying on level ground or walking up a slight incline. 2= On level ground, I walk slower than people of the same age because of breathlessness, or have to stop for breath when walking at my own pace. 3= I stop for breath after walking 100 yards or after a few minutes on level ground. 4=I am too breathless to leave the house or I am breathless when dressing.   . The patient completed the [place] ([facility] [place] [facility]).  This questionnaire relates activities of daily living and shortness of breath.  The score ranges from 0-120, a higher score relates to severe shortness of breath during activities of daily living. The patient's score at entrance was 86.  Quality of Life: . Ferrans and Powers Quality of Life Index Pulmonary Version is used to assess the patients satisfaction in different domains of their life; health and functioning, socioeconomic, psychological/spiritual, and family. The overall score is recorded out of 30 points.  The patient's goal is to achieve an overall score of 21 or higher.   [name] received a 12.69 at entrance.  . The Patient Health Questionnaire (PHQ-2) is a first step approach for the screening of depression.  If the patient scores positive on the PHQ-2 the patient should be further assessed with the PHQ-9.  The Patient Health Questionnaire (PHQ-9) assesses the degree of depression.  Depression is important to monitor and track in pulmonary patients due to its prevalence in the population.  If the patient advances to the PHQ-9 the goal is to score less than 4 on this assessment.  [name] scored a 2 at entrance.  Clinical Assessment Tools: . The COPD Assessment Test (CAT) is a measurement tool to quantify how much of an impact the disease has on the patient's life.  This assessment aids the Pulmonary Rehab Team in designing the patients individualized treatment plan.  A CAT score ranges from 0-40.  A score of 10 or below indicates that COPD has a low impact on the patient's life whereas a score of 30 or higher indicates a severe impact. The patient's goal is a decrease of 1 point from entrance to discharge.  [name] had a CAT score of 32 at entrance.  Nutrition: . The "Rate My Plate" is a dietary assessment that quantifies the balance of a patient's diet.  This tool allows the Pulmonary Rehab Team to key in on the areas of the patient's diet that needs improving.  The team can then focus their nutritional education on those areas.  If the patient scores 24-40, this means there are many ways  they can make their eating habits healthier, 41-57 states that there are some ways they can make their eating habits healthier and a score of 58-72 states that they are making many healthy choices.  The patient's goal is to achieve a score of 49 or higher on this assessment.  [name] scored a 43 at entrance.  Oxygen Compliance: . Patient is currently on 0 liters at rest, 0 liters at night, and 0 liters for exercise.  [name] is not currently using a cpap/bipap at night.     Education: . [name] will  attend education classes during the course of Pulmonary Rehab.  Education classes that will be offered to the patient are Activities of Daily Living and Energy Conservation, Pursed Lip Breathing and Diaphragmatic Breathing, Nutrition, Exercise for the Pulmonary Patient, Warning Signs of Infection, Chronic Lung Disease, Advanced Directives, Medications, and Stress and Meditation.  The patient completed an assessment at the entrance of the program and will complete it again upon discharge to demonstrate the level of understanding provided by the educational classes.  This assessment includes 14 questions regarding all of the education topics above.  [name] achieved a score of 10/14 at entrance.  Smoking Cessation:  The patient has never been a smoker.  Exercise: Marland Kitchen [name] will be provided with an individualized Home Exercise Prescription (HEP) at the entrance of the program.  The patient will be followed by the Pulmonary Exercise Physiologist throughout the program to assist with the progression of the frequency, intensity, time, and type of exercise. The patient's long-term goal is to be exercising 30-60 minutes, 3-5 days per week. At entrance, the patient was exercising 0 days at home.

## 2014-11-09 ENCOUNTER — Encounter (HOSPITAL_COMMUNITY)
Admission: RE | Admit: 2014-11-09 | Discharge: 2014-11-09 | Disposition: A | Discharge status: Home / Self Care | Payer: Commercial Managed Care - HMO | Source: Ambulatory Visit | Attending: Internal Medicine | Admitting: Internal Medicine

## 2014-11-09 ENCOUNTER — Encounter (HOSPITAL_COMMUNITY): Admission: RE | Admit: 2014-11-09 | Payer: Commercial Managed Care - HMO | Source: Ambulatory Visit

## 2014-11-09 DIAGNOSIS — J449 Chronic obstructive pulmonary disease, unspecified: Secondary | ICD-10-CM | POA: Diagnosis present

## 2014-11-09 DIAGNOSIS — J479 Bronchiectasis, uncomplicated: Secondary | ICD-10-CM | POA: Diagnosis not present

## 2014-11-09 NOTE — Progress Notes (Signed)
Today, [name] exercised at [employer] [facility]. Service time was from 1030 to 1200.  The patient exercised by performing aerobic, strengthening, and stretching exercises. Oxygen saturation, heart rate, blood pressure, rate of perceived exertion, and shortness of breath were all monitored before, during, and after exercise. [name] presented with no problems at today's exercise session.  The patient did not have an increase in workload intensity during today's exercise session.  Pre-exercise vitals: . Weight kg: 53.9 . Liters of O2: ra . SpO2: 98 . HR: 83 . BP: 120/68 . CBG: na  Exercise vitals: . Highest heartrate:  91 . Lowest oxygen saturation: 95 . Highest blood pressure: 134/82 . Liters of 02: ra  Post-exercise vitals: . SpO2: 97 . HR: 83 . BP: 118/78 . Liters of O2: ra . CBG: na  Dr. Brand Males, Medical Director Dr. Coralyn Pear is immediately available during today's Pulmonary Rehab session for [name] on 11/09/2014 at 1030 class time.

## 2014-11-11 ENCOUNTER — Encounter (HOSPITAL_COMMUNITY)
Admission: RE | Admit: 2014-11-11 | Discharge: 2014-11-11 | Disposition: A | Discharge status: Home / Self Care | Payer: Commercial Managed Care - HMO | Source: Ambulatory Visit | Attending: Internal Medicine | Admitting: Internal Medicine

## 2014-11-11 ENCOUNTER — Encounter (HOSPITAL_COMMUNITY): Payer: Commercial Managed Care - HMO

## 2014-11-11 DIAGNOSIS — J449 Chronic obstructive pulmonary disease, unspecified: Secondary | ICD-10-CM | POA: Diagnosis not present

## 2014-11-11 NOTE — Progress Notes (Signed)
Today, [name] exercised at [facility]. Service time was from 1030 to 1225.  The patient exercised by performing aerobic, strengthening, and stretching exercises. Oxygen saturation, heart rate, blood pressure, rate of perceived exertion, and shortness of breath were all monitored before, during, and after exercise. [name] presented with no problems at today's exercise session. [name] also attended an education session on warning signs and symptoms.  The patient did not have an increase in workload intensity during today's exercise session.  Pre-exercise vitals: . Weight kg: 54.0 . Liters of O2: ra . SpO2: 94 . HR: 81 . BP: 110/80 . CBG: na  Exercise vitals: . Highest heartrate:  92 . Lowest oxygen saturation: 98 . Highest blood pressure: 132/74 . Liters of 02: ra  Post-exercise vitals: . SpO2: 92 . HR: ra . BP: 72 . Liters of O2: 116/72 . CBG: na  Dr. Brand Males, Medical Director Dr. Dyann Kief is immediately available during today's Pulmonary Rehab session for [name] on 11/11/2014 at 1030 class time.

## 2014-11-16 ENCOUNTER — Encounter (HOSPITAL_COMMUNITY): Payer: Commercial Managed Care - HMO

## 2014-11-16 ENCOUNTER — Other Ambulatory Visit: Payer: Self-pay | Admitting: *Deleted

## 2014-11-16 ENCOUNTER — Encounter (HOSPITAL_COMMUNITY)
Admission: RE | Admit: 2014-11-16 | Discharge: 2014-11-16 | Disposition: A | Discharge status: Home / Self Care | Payer: Commercial Managed Care - HMO | Source: Ambulatory Visit | Attending: Internal Medicine | Admitting: Internal Medicine

## 2014-11-16 DIAGNOSIS — J449 Chronic obstructive pulmonary disease, unspecified: Secondary | ICD-10-CM | POA: Diagnosis not present

## 2014-11-16 MED ORDER — DILTIAZEM HCL 120 MG PO TABS: 120.0000 mg | ORAL_TABLET | Freq: Two times a day (BID) | ORAL | Status: DC

## 2014-11-16 NOTE — Progress Notes (Signed)
Today, Romelia exercised at [facility] [facility]. Service time was from 10:30am to 12:10pm.  The patient exercised by performing aerobic, strengthening, and stretching exercises. Oxygen saturation, heart rate, blood pressure, rate of perceived exertion, and shortness of breath were all monitored before, during, and after exercise. Rajah presented with no problems at today's exercise session.  The patient did have an increase in workload intensity during today's exercise session.  Pre-exercise vitals: . Weight kg: 54.6 . Liters of O2: ra . SpO2: 91 . HR: 88 . BP: 104/50 . CBG: na  Exercise vitals: . Highest heartrate:  93 . Lowest oxygen saturation: 90 . Highest blood pressure: 110/52 . Liters of 02: ra  Post-exercise vitals: . SpO2: 93 . HR: 78 . BP: 104/60 . Liters of O2: ra . CBG: na  Dr. Brand Males, Medical Director Dr. Dyann Kief is immediately available during today's Pulmonary Rehab session for Elyn Aquas on 11/16/14 at 10:30am class time.

## 2014-11-18 ENCOUNTER — Encounter (HOSPITAL_COMMUNITY)
Admission: RE | Admit: 2014-11-18 | Discharge: 2014-11-18 | Disposition: A | Discharge status: Home / Self Care | Payer: Commercial Managed Care - HMO | Source: Ambulatory Visit | Attending: Internal Medicine | Admitting: Internal Medicine

## 2014-11-18 ENCOUNTER — Encounter (HOSPITAL_COMMUNITY): Payer: Commercial Managed Care - HMO

## 2014-11-18 ENCOUNTER — Ambulatory Visit: Payer: Commercial Managed Care - HMO | Admitting: Internal Medicine

## 2014-11-18 DIAGNOSIS — J449 Chronic obstructive pulmonary disease, unspecified: Secondary | ICD-10-CM | POA: Diagnosis not present

## 2014-11-18 NOTE — Progress Notes (Signed)
[name] 73 y.o. female Nutrition Note Spoke with pt. Pt is at a normal wt yet slow, chronic wt loss over the past 3-4 years noted. Pt wt 120.1 lb (54.6 kg) today, which is up 2 lb since pt started rehab. Pt states she was drinking Boost/Ensure, but stopped "because I thought my weight was stable." Will monitor pt wt trends with team. Pt educated re: high calorie, high protein diet due to recent h/o of wt loss. Given pt could benefit from wt gain, pt's Rate Your Plate results briefly reviewed with pt. Pt avoids most salty food; uses No Added Salt or Low sodium canned food occasionally.  Pt does not add salt to food.  The role of sodium in lung disease reviewed with pt. Pt expressed understanding of the information reviewed.  Nutrition Diagnosis ? Food-and nutrition-related knowledge deficit related to lack of exposure to information as related to diagnosis of pulmonary disease ? Unintentional wt loss related to fatigue during food preparation and decreased food intake as evidenced by slow, chronic wt loss of 19-25 lb over 3-4 years  Nutrition Rx/Est. Daily Nutrition Needs for: ? wt gain 2000-2500 Kcal  85-100 gm protein   1500 mg or less sodium   Nutrition Intervention ? Pt's individual nutrition plan and goals reviewed with pt. ? Benefits of adopting healthy eating habits discussed when pt's Rate Your Plate reviewed. ? Pt to attend the Nutrition and Lung Disease class ? Continual client-centered nutrition education by RD, as part of interdisciplinary care. Goal(s) 1. Identify food quantities necessary to achieve wt gain to a goal wt of 54.5-56.8 kg (120-125 lb) at graduation from pulmonary rehab. Monitor and Evaluate progress toward nutrition goal with team.   Derek Mound, M.Ed, RD, LDN, CDE 11/18/2014 1:33 PM

## 2014-11-18 NOTE — Progress Notes (Signed)
Today, Donnesha exercised at [employer] [facility]. Service time was from 1030 to 1230.  The patient exercised by performing aerobic, strengthening, and stretching exercises. Oxygen saturation, heart rate, blood pressure, rate of perceived exertion, and shortness of breath were all monitored before, during, and after exercise. Bellami presented with no problems at today's exercise session. Bena also attended an education session on anatomy and physiology of the respiratory system.  The patient did not have an increase in workload intensity during today's exercise session.  Pre-exercise vitals: . Weight kg: 54.6 . Liters of O2: ra . SpO2: 96 . HR: 85 . BP: 124/60 . CBG: na  Exercise vitals: . Highest heartrate:  94 . Lowest oxygen saturation: 91 . Highest blood pressure: 154/80 . Liters of 02: ra  Post-exercise vitals: . SpO2: 95 . HR: 83 . BP: 122/82 . Liters of O2: ra . CBG: na  Dr. Brand Males, Medical Director Dr. Coralyn Pear is immediately available during today's Pulmonary Rehab session for Elyn Aquas on 11/18/2014 at 1030 class time.

## 2014-11-23 ENCOUNTER — Encounter (HOSPITAL_COMMUNITY): Payer: Commercial Managed Care - HMO

## 2014-11-23 ENCOUNTER — Encounter (HOSPITAL_COMMUNITY)
Admission: RE | Admit: 2014-11-23 | Discharge: 2014-11-23 | Disposition: A | Discharge status: Home / Self Care | Payer: Commercial Managed Care - HMO | Source: Ambulatory Visit | Attending: Internal Medicine | Admitting: Internal Medicine

## 2014-11-23 ENCOUNTER — Telehealth: Payer: Self-pay | Admitting: Internal Medicine

## 2014-11-23 DIAGNOSIS — J449 Chronic obstructive pulmonary disease, unspecified: Secondary | ICD-10-CM | POA: Diagnosis not present

## 2014-11-23 NOTE — Telephone Encounter (Signed)
Noted and added to appt notes for Thursday. Thanks.

## 2014-11-23 NOTE — Progress Notes (Signed)
Today, [name] exercised at [facility] [facility]. Service time was from 10:30am to 12:30pm.  The patient exercised by performing aerobic, strengthening, and stretching exercises. Oxygen saturation, heart rate, blood pressure, rate of perceived exertion, and shortness of breath were all monitored before, during, and after exercise. [name] presented with no problems at today's exercise session. The patient reviewed Pursed Lip and Diaphragmatic Breathing today with [name].  The patient did have an increase in workload intensity during today's exercise session.  Pre-exercise vitals: . Weight kg: 54.5 . Liters of O2: ra . SpO2: 91 . HR: 90 . BP: 104/60 . CBG: na  Exercise vitals: . Highest heartrate:  99 . Lowest oxygen saturation: 91 . Highest blood pressure: 130/80 . Liters of 02: ra  Post-exercise vitals: . SpO2: 95 . HR: 91 . BP: 104/60 . Liters of O2: ra . CBG: na  Dr. Brand Males, Medical Director Dr. Coralyn Pear is immediately available during today's Pulmonary Rehab session for [name] on 11/23/14 at 10:30am class time.

## 2014-11-23 NOTE — Telephone Encounter (Signed)
Called spoke with [place] from [facility]. Pt has an appt on Thursday and will need to be re qualified. FYI for [employer]

## 2014-11-25 ENCOUNTER — Ambulatory Visit: Payer: Commercial Managed Care - HMO | Admitting: Internal Medicine

## 2014-11-25 ENCOUNTER — Encounter (HOSPITAL_COMMUNITY)
Admission: RE | Admit: 2014-11-25 | Discharge: 2014-11-25 | Disposition: A | Discharge status: Home / Self Care | Payer: Commercial Managed Care - HMO | Source: Ambulatory Visit | Attending: Internal Medicine | Admitting: Internal Medicine

## 2014-11-25 ENCOUNTER — Encounter (HOSPITAL_COMMUNITY): Payer: Commercial Managed Care - HMO

## 2014-11-25 DIAGNOSIS — J449 Chronic obstructive pulmonary disease, unspecified: Secondary | ICD-10-CM | POA: Diagnosis not present

## 2014-11-25 NOTE — Progress Notes (Signed)
Today, [name] exercised at [facility] [facility]. Service time was from 1115 to 1115.  The patient exercised by performing aerobic, strengthening, and stretching exercises. Oxygen saturation, heart rate, blood pressure, rate of perceived exertion, and shortness of breath were all monitored before, during, and after exercise. [name] presented with no problems at today's exercise session. She attended doctor day with Dr Nelda Marseille today.  The patient did  have an increase in workload intensity during today's exercise session.  Pre-exercise vitals: . Weight kg: 54.5 . Liters of O2: RA . SpO2: 94 . HR: 90 . BP: 100/60 . CBG: NA  Exercise vitals: . Highest heartrate:  117 . Lowest oxygen saturation: 90 . Highest blood pressure: 126/70 . Liters of 02: RA  Post-exercise vitals: . SpO2: 93 . HR: 100 . BP: 110/60 . Liters of O2: RA . CBG: NA Dr. Brand Males, Medical Director Dr. Dyann Kief is immediately available during today's Pulmonary Rehab session for [name] on 11/25/2014  at 1115 class time.

## 2014-11-30 ENCOUNTER — Encounter (HOSPITAL_COMMUNITY): Payer: Commercial Managed Care - HMO

## 2014-11-30 ENCOUNTER — Encounter (HOSPITAL_COMMUNITY)
Admission: RE | Admit: 2014-11-30 | Discharge: 2014-11-30 | Disposition: A | Discharge status: Home / Self Care | Payer: Commercial Managed Care - HMO | Source: Ambulatory Visit | Attending: Internal Medicine | Admitting: Internal Medicine

## 2014-11-30 DIAGNOSIS — J479 Bronchiectasis, uncomplicated: Secondary | ICD-10-CM | POA: Insufficient documentation

## 2014-11-30 NOTE — Psychosocial Assessment (Signed)
[name] 74 y.o. female  30 day Psychosocial Note  Patient psychosocial assessment reveals no barriers to participation in Pulmonary Rehab. Patient does continue to exhibit positive coping skills to deal with any psychosocial concerns that may arrise. Offered emotional support and reassurance. Patient does feel she is making progress toward Pulmonary Rehab goals. Patient reports her health and activity level has improved "tremendously" in the past 30 days as evidenced by patient's report of increased ability to consistently prepare meals for herself and her husband. She also is more confidant with attending social activities and states she was able to attend evening service at church this past Sunday. She is able to shower with greater ease. Her hospitalizations/doctors visits for acute issues have diminished. She relates this to decreased anxiety. Patient states family/friends have definitely noticed changes in her activity and mood. She states she now has increased confidence in her ability to handle sick days and exacerbations. Patient reports feeling positive about current and projected progression in Pulmonary Rehab. After reviewing the patient's treatment plan, the patient is making progress toward Pulmonary Rehab goals. Patient's rate of progress toward rehab goals is excellent. Plan of action to help patient continue to work towards rehab goals include increasing workloads as tolerated on equipment in order to increase stamina and stregnth. Will continue to monitor and evaluate progress toward psychosocial goal(s).  Goal(s) in progress: Improved management of anxiety  Help patient work toward returning to meaningful activities that improve patient's QOL and are attainable with patient's lung disease

## 2014-11-30 NOTE — Progress Notes (Signed)
Today, Rajanae exercised at [facility]. Service time was from 10:30am to 12:10pm.  The patient exercised by performing aerobic, strengthening, and stretching exercises. Oxygen saturation, heart rate, blood pressure, rate of perceived exertion, and shortness of breath were all monitored before, during, and after exercise. [name] presented with no problems at today's exercise session.  The patient did not have an increase in workload intensity during today's exercise session.  Pre-exercise vitals: . Weight kg: 54.6 . Liters of O2: ra . SpO2: 95 . HR: 88 . BP: 112/74 . CBG: na  Exercise vitals: . Highest heartrate:  97 . Lowest oxygen saturation: 92 . Highest blood pressure: 140/70 . Liters of 02: ra  Post-exercise vitals: . SpO2: 91 . HR: 83 . BP: 122/78 . Liters of O2: ra . CBG: na  Dr. Brand Males, Medical Director Dr. Dyann Kief is immediately available during today's Pulmonary Rehab session for Elyn Aquas on 11/30/14 at 10:30am class time.

## 2014-12-01 ENCOUNTER — Ambulatory Visit
Admission: RE | Admit: 2014-12-01 | Discharge: 2014-12-01 | Disposition: A | Discharge status: Home / Self Care | Payer: Commercial Managed Care - HMO | Source: Ambulatory Visit | Attending: Internal Medicine | Admitting: Internal Medicine

## 2014-12-01 DIAGNOSIS — Z1231 Encounter for screening mammogram for malignant neoplasm of breast: Secondary | ICD-10-CM

## 2014-12-02 ENCOUNTER — Encounter (HOSPITAL_COMMUNITY)
Admission: RE | Admit: 2014-12-02 | Discharge: 2014-12-02 | Disposition: A | Discharge status: Home / Self Care | Payer: Commercial Managed Care - HMO | Source: Ambulatory Visit | Attending: Internal Medicine | Admitting: Internal Medicine

## 2014-12-02 ENCOUNTER — Encounter (HOSPITAL_COMMUNITY): Payer: Commercial Managed Care - HMO

## 2014-12-02 DIAGNOSIS — J479 Bronchiectasis, uncomplicated: Secondary | ICD-10-CM | POA: Diagnosis not present

## 2014-12-02 NOTE — Progress Notes (Signed)
Today, Alantra exercised at [facility]. Service time was from 1030 to 1220.  The patient exercised by performing aerobic, strengthening, and stretching exercises. Oxygen saturation, heart rate, blood pressure, rate of perceived exertion, and shortness of breath were all monitored before, during, and after exercise. [name] presented with no problems at today's exercise session. She attended risk factor reduction class today.[name] had increased SOB during exercise and used her rescue inhaler with relief.  The patient did not have an increase in workload intensity during today's exercise session.  Pre-exercise vitals: . Weight kg: 54.6 . Liters of O2: RA . SpO2: 96 . HR: 80 . BP: 124/80 . CBG: NA  Exercise vitals: . Highest heartrate:  97 . Lowest oxygen saturation: 89 . Highest blood pressure: 140/70 . Liters of 02: RA  Post-exercise vitals: . SpO2: 92 . HR: 81 . BP: 114/60 . Liters of O2: RA . CBG: NA Dr. Brand Males, Medical Director Dr. Frederic Jericho is immediately available during today's Pulmonary Rehab session for [name] on 12/02/2014  at 1030 class time.   Marland Kitchen

## 2014-12-07 ENCOUNTER — Encounter (HOSPITAL_COMMUNITY): Payer: Commercial Managed Care - HMO

## 2014-12-07 ENCOUNTER — Encounter (HOSPITAL_COMMUNITY)
Admission: RE | Admit: 2014-12-07 | Discharge: 2014-12-07 | Disposition: A | Discharge status: Home / Self Care | Payer: Commercial Managed Care - HMO | Source: Ambulatory Visit | Attending: Internal Medicine | Admitting: Internal Medicine

## 2014-12-07 DIAGNOSIS — J479 Bronchiectasis, uncomplicated: Secondary | ICD-10-CM | POA: Diagnosis not present

## 2014-12-07 NOTE — Progress Notes (Signed)
Today, [name] exercised at [facility]. Service time was from 10:30am to 12:10pm.  The patient exercised by performing aerobic, strengthening, and stretching exercises. Oxygen saturation, heart rate, blood pressure, rate of perceived exertion, and shortness of breath were all monitored before, during, and after exercise. [name] presented with no problems at today's exercise session.  The patient did have an increase in workload intensity during today's exercise session.  Pre-exercise vitals: . Weight kg: 55.2 . Liters of O2: ra . SpO2: 93 . HR: 84 . BP: 122/60 . CBG: na  Exercise vitals: . Highest heartrate:  117 . Lowest oxygen saturation: 86 . Highest blood pressure: 162/64 . Liters of 02: ra  Post-exercise vitals: . SpO2: 90 . HR: 92 . BP: 140/70 . Liters of O2: ra . CBG: na  Dr. Brand Males, Medical Director Dr. Frederic Jericho is immediately available during today's Pulmonary Rehab session for [name] on 12/07/14 at 10:30am class time.

## 2014-12-09 ENCOUNTER — Encounter (HOSPITAL_COMMUNITY): Payer: Commercial Managed Care - HMO

## 2014-12-09 ENCOUNTER — Encounter (HOSPITAL_COMMUNITY)
Admission: RE | Admit: 2014-12-09 | Discharge: 2014-12-09 | Disposition: A | Discharge status: Home / Self Care | Payer: Commercial Managed Care - HMO | Source: Ambulatory Visit | Attending: Internal Medicine | Admitting: Internal Medicine

## 2014-12-09 DIAGNOSIS — J479 Bronchiectasis, uncomplicated: Secondary | ICD-10-CM | POA: Diagnosis not present

## 2014-12-14 ENCOUNTER — Encounter (HOSPITAL_COMMUNITY)
Admission: RE | Admit: 2014-12-14 | Discharge: 2014-12-14 | Disposition: A | Discharge status: Home / Self Care | Payer: Commercial Managed Care - HMO | Source: Ambulatory Visit | Attending: Internal Medicine | Admitting: Internal Medicine

## 2014-12-14 ENCOUNTER — Encounter (HOSPITAL_COMMUNITY): Payer: Commercial Managed Care - HMO

## 2014-12-14 DIAGNOSIS — J479 Bronchiectasis, uncomplicated: Secondary | ICD-10-CM | POA: Diagnosis not present

## 2014-12-14 NOTE — Progress Notes (Signed)
Today, Jovanka exercised at [facility]. Service time was from 1030 to 1215.  The patient exercised by performing aerobic, strengthening, and stretching exercises. Oxygen saturation, heart rate, blood pressure, rate of perceived exertion, and shortness of breath were all monitored before, during, and after exercise. Alany presented with no problems at today's exercise session.  The patient did not have an increase in workload intensity during today's exercise session.  Pre-exercise vitals: . Weight kg: 56.3 . Liters of O2: RA . SpO2: 96 . HR: 80 . BP: 120/68 . CBG: NA  Exercise vitals: . Highest heartrate:  95 . Lowest oxygen saturation: 91 . Highest blood pressure: 110/60 . Liters of 02: RA  Post-exercise vitals: . SpO2: 95 . HR: 77 . BP: 102/64 . Liters of O2: RA . CBG: NA Dr. Brand Males, Medical Director Dr. Sloan Leiter is immediately available during today's Pulmonary Rehab session for Elyn Aquas on 12/14/2014  at 1030 class time.  Marland Kitchen

## 2014-12-16 ENCOUNTER — Encounter (HOSPITAL_COMMUNITY): Payer: Commercial Managed Care - HMO

## 2014-12-16 ENCOUNTER — Encounter (HOSPITAL_COMMUNITY)
Admission: RE | Admit: 2014-12-16 | Discharge: 2014-12-16 | Disposition: A | Discharge status: Home / Self Care | Payer: Commercial Managed Care - HMO | Source: Ambulatory Visit | Attending: Internal Medicine | Admitting: Internal Medicine

## 2014-12-16 DIAGNOSIS — J479 Bronchiectasis, uncomplicated: Secondary | ICD-10-CM | POA: Diagnosis not present

## 2014-12-16 NOTE — Progress Notes (Signed)
Today, [name] exercised at [facility]. Service time was from 1030 to 1245.  The patient exercised by performing aerobic, strengthening, and stretching exercises. Oxygen saturation, heart rate, blood pressure, rate of perceived exertion, and shortness of breath were all monitored before, during, and after exercise. [name] presented with no problems at today's exercise session. She attended nutrition class today with Parke Simmers.  The patient did not have an increase in workload intensity during today's exercise session.  Pre-exercise vitals: . Weight kg: 56.2 . Liters of O2: RA . SpO2: 94 . HR: 75 . BP: 100/56 . CBG: NA  Exercise vitals: . Highest heartrate:  103 . Lowest oxygen saturation: 90 . Highest blood pressure: 122/60 . Liters of 02: RA  Post-exercise vitals: . SpO2: 91 . HR: 82 . BP: 112/64 . Liters of O2: RA . CBG: NA Dr. Brand Males, Medical Director Dr. Sloan Leiter is immediately available during today's Pulmonary Rehab session for [name] on 12/16/2014  at 1030 class time.  Marland Kitchen

## 2014-12-21 ENCOUNTER — Encounter (HOSPITAL_COMMUNITY): Payer: Commercial Managed Care - HMO

## 2014-12-21 ENCOUNTER — Encounter (HOSPITAL_COMMUNITY)
Admission: RE | Admit: 2014-12-21 | Discharge: 2014-12-21 | Disposition: A | Discharge status: Home / Self Care | Payer: Commercial Managed Care - HMO | Source: Ambulatory Visit | Attending: Internal Medicine | Admitting: Internal Medicine

## 2014-12-21 DIAGNOSIS — J479 Bronchiectasis, uncomplicated: Secondary | ICD-10-CM | POA: Diagnosis not present

## 2014-12-21 NOTE — Psychosocial Assessment (Signed)
[name] 74 y.o. female  3 day Psychosocial Note  Patient psychosocial assessment reveals no barriers to participation in Pulmonary Rehab. She attends rehab on a consistent basis and participates to her fullest ability. Patient does continue to exhibit positive coping skills to deal with any psychosocial concerns that may arrise. Offered emotional support and reassurance. Patient continues to  feel she is making progress toward Pulmonary Rehab goals. Patient reports her health and activity level continue to improve. She continues to be able to prepare most meals for she and her husband. She is attending more social functions with confidence. Her family/friends have noticed changes in her activity, mood, and levels of anxiety. She now manages her anxiety from shortness of breath with pursed lip breathing. Patient reports feeling positive about current and projected progression in Pulmonary Rehab. She states that her family "wishes she could continue pulmonary rehab forever". After reviewing the patient's treatment plan, the patient is making progress toward Pulmonary Rehab goals. Patient's rate of progress toward rehab goals is excellent. Plan of action to help patient continue to work towards rehab goals include increasing workloads as tolerated on equipment in order to increase stamina and stregnth. Will continue to monitor and evaluate progress toward psychosocial goal(s).  Goal(s) in progress: Improved management of anxiety  Help patient work toward returning to meaningful activities that improve patient's QOL and are attainable with patient's lung disease

## 2014-12-21 NOTE — Progress Notes (Signed)
Today, [name] exercised at [employer]. [facility]. Service time was from 10:30am to 12:15pm.  The patient exercised by performing aerobic, strengthening, and stretching exercises. Oxygen saturation, heart rate, blood pressure, rate of perceived exertion, and shortness of breath were all monitored before, during, and after exercise. [name] presented with no problems at today's exercise session.  The patient did not have an increase in workload intensity during today's exercise session.  Pre-exercise vitals: . Weight kg: 56.2 . Liters of O2: ra . SpO2: 95 . HR: 80 . BP: 104/62 . CBG: na  Exercise vitals: . Highest heartrate:  90 . Lowest oxygen saturation: 96 . Highest blood pressure: 97 . Liters of 02: ra  Post-exercise vitals: . SpO2: 95 . HR: 77 . BP: 104/60 . Liters of O2: ra . CBG: na  Dr. Brand Males, Medical Director Dr. Candiss Norse is immediately available during today's Pulmonary Rehab session for [name] on 12/21/14 at 10:30am class time.

## 2014-12-21 NOTE — Progress Notes (Signed)
I have reviewed a Home Exercise Prescription with [name] . [name] is currently exercising minimally at home.  The patient was advised to walk 2-3 days a week for 25 minutes.  Faelyn and I discussed how to progress their exercise prescription.  The patient stated that their goals were to become stronger, educated on her disease process, and stay out of the hospital.  The patient stated that they understand the exercise prescription.  We reviewed exercise guidelines, target heart rate during exercise, oxygen use, weather, home pulse oximeter, endpoints for exercise, and goals.  Patient is encouraged to come to me with any questions. I will continue to follow up with the patient to assist them with progression and safety.

## 2014-12-23 ENCOUNTER — Encounter (HOSPITAL_COMMUNITY): Payer: Commercial Managed Care - HMO

## 2014-12-23 ENCOUNTER — Encounter (HOSPITAL_COMMUNITY)
Admission: RE | Admit: 2014-12-23 | Discharge: 2014-12-23 | Disposition: A | Discharge status: Home / Self Care | Payer: Commercial Managed Care - HMO | Source: Ambulatory Visit | Attending: Internal Medicine | Admitting: Internal Medicine

## 2014-12-23 DIAGNOSIS — J479 Bronchiectasis, uncomplicated: Secondary | ICD-10-CM | POA: Diagnosis not present

## 2014-12-23 NOTE — Progress Notes (Signed)
Today, [name] exercised at [employer]. [facility]. Service time was from 10:30am to 12:40pm.  The patient exercised by performing aerobic, strengthening, and stretching exercises. Oxygen saturation, heart rate, blood pressure, rate of perceived exertion, and shortness of breath were all monitored before, during, and after exercise. [name] presented with no problems at today's exercise session. The patient attended education today with Trish Fountain on Oxygen Safety.  The patient did not have an increase in workload intensity during today's exercise session.  Pre-exercise vitals: . Weight kg: 56.9 . Liters of O2: ra . SpO2: 96 . HR: 76 . BP: 122/64 . CBG: na  Exercise vitals: . Highest heartrate:  86 . Lowest oxygen saturation: 89 . Highest blood pressure: 118/60 . Liters of 02: ra  Post-exercise vitals: . SpO2: 95 . HR: 75 . BP: 120/70 . Liters of O2: ra . CBG: na  Dr. Brand Males, Medical Director Dr. Candiss Norse is immediately available during today's Pulmonary Rehab session for [name] on 12/23/14 at 10:30am class time.

## 2014-12-28 ENCOUNTER — Encounter (HOSPITAL_COMMUNITY): Payer: Commercial Managed Care - HMO

## 2014-12-28 ENCOUNTER — Encounter (HOSPITAL_COMMUNITY)
Admission: RE | Admit: 2014-12-28 | Discharge: 2014-12-28 | Disposition: A | Discharge status: Home / Self Care | Payer: Commercial Managed Care - HMO | Source: Ambulatory Visit | Attending: Internal Medicine | Admitting: Internal Medicine

## 2014-12-28 DIAGNOSIS — J479 Bronchiectasis, uncomplicated: Secondary | ICD-10-CM | POA: Diagnosis not present

## 2014-12-28 NOTE — Progress Notes (Signed)
Today, [name] exercised at [employer]. [facility]. Service time was from 1030 to 1210.  The patient exercised by performing aerobic, strengthening, and stretching exercises. Oxygen saturation, heart rate, blood pressure, rate of perceived exertion, and shortness of breath were all monitored before, during, and after exercise. [name] presented with no problems at today's exercise session.  The patient did not have an increase in workload intensity during today's exercise session.  Pre-exercise vitals: . Weight kg: 57.0 . Liters of O2: RA . SpO2: 95 . HR: 78 . BP: 122/64 . CBG: NA  Exercise vitals: . Highest heartrate:  96 . Lowest oxygen saturation: 92 . Highest blood pressure: 140/68 . Liters of 02: RA  Post-exercise vitals: . SpO2: 93 . HR: 82 . BP: 118/78 . Liters of O2: RA . CBG: NA Dr. Brand Males, Medical Director Dr. Candiss Norse is immediately available during today's Pulmonary Rehab session for [name] on 12/28/2014  at 1030 class time.  Marland Kitchen

## 2014-12-30 ENCOUNTER — Encounter (HOSPITAL_COMMUNITY): Payer: Commercial Managed Care - HMO

## 2015-01-04 ENCOUNTER — Encounter (HOSPITAL_COMMUNITY)
Admission: RE | Admit: 2015-01-04 | Discharge: 2015-01-04 | Disposition: A | Discharge status: Home / Self Care | Payer: Commercial Managed Care - HMO | Source: Ambulatory Visit | Attending: Internal Medicine | Admitting: Internal Medicine

## 2015-01-04 ENCOUNTER — Encounter (HOSPITAL_COMMUNITY): Payer: Commercial Managed Care - HMO

## 2015-01-04 DIAGNOSIS — J479 Bronchiectasis, uncomplicated: Secondary | ICD-10-CM | POA: Insufficient documentation

## 2015-01-06 ENCOUNTER — Ambulatory Visit (INDEPENDENT_AMBULATORY_CARE_PROVIDER_SITE_OTHER): Payer: Commercial Managed Care - HMO | Admitting: Internal Medicine

## 2015-01-06 ENCOUNTER — Encounter (HOSPITAL_COMMUNITY): Payer: Commercial Managed Care - HMO

## 2015-01-06 ENCOUNTER — Encounter: Payer: Self-pay | Admitting: Internal Medicine

## 2015-01-06 ENCOUNTER — Telehealth: Payer: Self-pay | Admitting: Internal Medicine

## 2015-01-06 VITALS — BP 120/68 | HR 78 | Ht 62.0 in | Wt 126.4 lb

## 2015-01-06 DIAGNOSIS — J470 Bronchiectasis with acute lower respiratory infection: Secondary | ICD-10-CM

## 2015-01-06 DIAGNOSIS — J4521 Mild intermittent asthma with (acute) exacerbation: Secondary | ICD-10-CM

## 2015-01-06 MED ORDER — MOXIFLOXACIN HCL 400 MG PO TABS: ORAL_TABLET | ORAL | Status: DC

## 2015-01-06 NOTE — Telephone Encounter (Signed)
Spoke with [name] and pt can be added at 3:30 today. appt scheduled. Nothing further needed

## 2015-01-06 NOTE — Progress Notes (Signed)
Subjective:    Patient ID: [name], female    DOB: [birth year], 74 y.o.   MRN: [id number]  HPI 10/04/10-69 yoF never smoker with hx of asthma, left hilar mass/ PET negative.. Last here in 2008 after hospital stay, when she saw [name] noting issues of hypertension and asthma meds. Notes reviewed. Seen now on referral by [name] after recent flare with shortness of breath, productive cough. She tends to feel better as long as she is on an antibiotic or steroids. Within a couple of weeks off, she will start to cough and get tight again. Had bronchitis as a child. In the last 5-6 years wheezing dyspnea has been more perennial. Occasionally wakes with cough. Sputum often yellow. Has had pneumonia x 2 with pneumovax. Little GERD or acid indigestion recognized. Triggers have included strong odors, seasonal pollens, chest colds, but not house dust. Had a sinus infection during the winter. Has been treated for heart rhythm and BP, but denies MI/ angina. She is using Symbicort 160 now and considers it sufficient used twice every day. She did not fill a script for a Proair rescue inhaler. 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  11/10/10- 69 yoF never smoker with hx of asthma, left hilar mass/ PET neg in 2008/ now progressive, RML atelectasis. CT 11/03/10- showed change progression since prior studies of 2008, when abnormalities / PET negative, were attributed to scarring. -Now 2.6 x 2,2 cm left hilar mass, slowly growing. Post obstructive LLL opacity. New RML collapse. Scattered bronchiectasis. No effusion. Denies fever, night sweat, nodes. Cough has been a little less, with brownish to clear mucus, no blood. Has to chew deliberately to avoid choking. Has rarely awakened with hard cough. Symbicort has helped cough. No weight loss. No chest pain, but some shifting back pain. We reviewed her images and the technique and risks of bronchoscopy.   12/12/10- 69 yoF never smoker with hx of asthma, left hilar mass/ PET  neg in 2008/ now progressive, RML atelectasis. PET neg 2012. Husband here  Post bronchoscopy f/u - Bronch 11/30/10- necrotic appearing mass occluding left lower lobe bronchus.  Path- small lymphocytes and eosinophils, neg for malignancy or granuloma. Cultures negative so far.  She still coughs up phlegm, but less than before bronchoscopy.  Aware of a variable nodule in left axilla- comes and goes, otherwise no lumps, nodes or etc. Occasional sweat on morning waking, otherwise no fever or sweat and no pain or blood. She has been aware "something is going on" but not toxic.   11/23/11-  25 yoF never smoker with hx of asthma, left hilar mass/ PET neg in 2008/ now progressive, RML atelectasis. PET neg 2012.  She says she is doing "fair". She has had bronchoscopy/ [name] after me ."Benign inflammatory"  Coughs more with stress. She had gone to emergency room June 8 with anxiety and shortness of breath. We subsequently called in prednisone taper. She denies fever, sweat, adenopathy or chest pain. Often short of breath if anxious. She avoids exertion such as climbing stairs. Prednisone helped cough more productive and she feels better having taken it. Phlegm now is yellow, sometimes darker with no blood. She says that "a bug bite escalates my anxiety". Lab-eosinophils 8%, always high. CXR 10/06/11 IMPRESSION:  No acute infiltrate or pulmonary edema. There is a cavitary lesion  in the left lower lobe retrocardiac region measures at least 2 cm.  Further evaluation with enhanced CT of the chest is recommended.  Original Report Authenticated By: [name]  POP, M.D.    12/25/11-  77 yoF never smoker with hx of asthma, left hilar mass/ PET neg in 2008/ now progressive, RML atelectasis. PET neg 2012.  States breathing has gotten better since last ov; review CT with patient in more detail Off prednisone since July 26. Did not need Symbicort. Cough produces some light yellow sputum but she denies fever, sweat, chest  pain. She wakes coughing occasionally. CT chest 12/25/11- images reviewed with her: IMPRESSION:  Interval decrease in size of the left infrahilar lesion which has  become air-filled in the interval. It is possible that this could  represent some central cystic bronchiectasis or decompression of  the previous fluid density nodule.  Interval re-expansion of the right middle lobe.  No new or progressive findings.  Original Report Authenticated By: [name], M.D.   06/24/12- 83 yoF never smoker with hx of asthma, left hilar mass/ PET neg in 2008/ then progressive, RML atelectasis. PET neg 2012.  FOLLOWS FOR: was doing pretty good until today-started having increased SOB ; unsure if wheezing. Family here With persistent questioning we determined that for the past month she had been noticing orthopnea, propping up to sleep. No palpitation ankle edema. Over the last 3 or 4 days she began noting dry throat and some mild low back ache. She had had diarrhea for a few days after church parking in mid February but that had resolved except for some residual urgency. No fever, chills. Sputum usually white, rarely brown. No chest pain, blood or swollen glands  07/08/12 71 yoF never smoker with hx of asthma, left hilar mass/ PET neg in 2008/ then progressive, RML atelectasis. PET neg 2012.    Son here FOLLOWS FOR: states breathing is slightly better since 2 weeks ago; ROOM AIR O2 of 90% upon arrival She feels she is doing better she comes in looking well dressed, stronger and comfortable occasional productive cough, scant clear sputum or little yellow-brown. No blood. Denies chest pain, fever or sweat. She is carrying her oxygen. Labs 06/24/12- CBC normaal except eosinophilia 9%. LFTs normal. Chemistry normal except CO2 34 suggesting compensation for respiratory acidosis. CT chest 06/26/12- images were reviewed with her and her son. IMPRESSION:  1. No evidence for acute pulmonary embolus.  2. Ground-glass  attenuation and peripheral nodular consolidation  within the right lower lobe consistent with pneumonitis. Findings  likely sequela of aspiration and/or pneumonia.  3. Cavitary lesion versus cystic bronchiectasis within the  perihilar left lower lobe. That this is unchanged in size from  previous exam but now contains a central soft tissue attenuating  filling defect which may represent a mycetoma.  4. Diffuse, bilateral bronchial wall thickening which is favored  to represent the sequela of chronic aspiration and bronchitis.  Original Report Authenticated By: Kerby Moors, M.D.  08/12/12- 16 yoF never smoker with hx of asthma, left hilar mass/ PET neg in 2008/ then progressive, RML atelectasis. PET neg 2012.    Son here FOLLOWS FOR: review Barium swallow results in detail with patient; having SOB and wheezing-worse with activity She reports feeling much better compared with last winter. Still some cough and shortness of breath white or slightly brown sputum. Dyspnea with vigorous exertion/stairs. Denies sweats or fever, chest pain or palpitation. Steroids make her feel better. Sleeps with O2 2L/ [facility]. Barium swallow 07/15/12- negative for evidence of reflux or aspiration.  09/19/12- 55 yoF never smoker with hx of asthma/ bronchiectasis, left hilar mass/cyst PET neg in 2008/ then progressive, RML  atelectasis. PET neg 2012.     FOLLOWS FOR: dry nasal area, watery eyes, cough that just started today. Strong perfume made her nose run. Eyes fdry CXR 08/22/12 IMPRESSION:  No edema or consolidation. The lesions seen recently on CT in the  left infrahilar region is not seen on chest radiography. In this  regard, suggest correlation with chest CT to further assess this  area on the left.  Original Report Authenticated By: Lowella Grip, M.D.  07/08/14- 58 yoF never smoker with hx of asthma/ bronchiectasis, left hilar mass/cyst PET neg in 2008/ then progressive, RML atelectasis. PET neg 2012.  Female companion here FOLLOWS FOR/ACUTE VISIT: Last seen 2014; having increased mucus-hard to get up; SOB and wheezing-worse with activity. No fevers however slight warmness at times. Waking more frequently with cough and choking that sounds like reflux events. Difficult for her to cough out phlegm. Denies fever or sweat. Admits shortness of breath particularly with exertion. Sleeps with head up on pillows. CT chest 06/26/12- reviewed with her IMPRESSION: 1. No evidence for acute pulmonary embolus. 2. Ground-glass attenuation and peripheral nodular consolidation within the right lower lobe consistent with pneumonitis. Findings likely sequela of aspiration and/or pneumonia. 3. Cavitary lesion versus cystic bronchiectasis within the perihilar left lower lobe. That this is unchanged in size from previous exam but now contains a central soft tissue attenuating filling defect which may represent a mycetoma. 4. Diffuse, bilateral bronchial wall thickening which is favored to represent the sequela of chronic aspiration and bronchitis. Original Report Authenticated By: Kerby Moors, M.D. CXR 02/18/14 IMPRESSION: Nodular opacities and persistent collapse of right middle lung. Suggest CT to exclude neoplasm. Diffuse emphysematous changes in the lungs. Electronically Signed  By: Lucienne Capers M.D.  On: 02/18/2014 04:51 Office spirometry 07/08/2014 Pre and postbronchodilator: Very severe obstructive airways disease with severe restriction of FVC and insignificant response to bronchodilator. FVC 0.90/41%, FEV1 0.44/27%, FEV1/FVC 0.49  ROS-see HPI Constitutional:   No-   weight loss, night sweats, fevers, chills, fatigue, lassitude. HEENT:   No-  headaches, difficulty swallowing, tooth/dental problems, sore throat,       No-  sneezing, itching, ear ache, nasal congestion, post nasal drip,  CV:  No-   chest pain, +orthopnea, no-PND, swelling in lower extremities, anasarca,  dizziness,  palpitations Resp: +   shortness of breath with exertion or at rest.              +  productive cough,  + non-productive cough,  No- coughing up of blood.              No-change in color of mucus.  No- wheezing.   Skin: No-   rash or lesions. GI:  No-   heartburn, indigestion, abdominal pain, nausea, vomiting,  GU: . MS:  No-   joint pain or swelling.   Neuro-     nothing unusual Psych:  No- change in mood or affect. No depression or anxiety.  No memory loss.  OBJ- Physical Exam General- Alert, Oriented, Affect-appropriate, Distress- none acute. Thin Skin- rash-none, lesions- none, excoriation- none   Lymphadenopathy- none Head- atraumatic            Eyes- Gross vision intact, PERRLA, conjunctivae and secretions clear            Ears- +somewhat hard of hearing            Nose- Clear, no-Septal dev, mucus, polyps, erosion, perforation  Throat- Mallampati II , mucosa clear , drainage- none, tonsils- atrophic Neck- flexible , trachea midline, no stridor , thyroid nl, carotid no bruit Chest - symmetrical excursion , unlabored           Heart/CV- RRR , no murmur , no gallop  , no rub, nl s1 s2                           - JVD- none , edema- none, stasis changes- none, varices- none           Lung- + wheeze and squeaks, unlabored, cough- none , dullness-none, rub- none           Chest wall-  Abd- Br/ Gen/ Rectal- Not done, not indicated Extrem- cyanosis- none, clubbing, none, atrophy- none, strength- nl Neuro- grossly intact to observation     Subjective:    Patient ID: [name], female    DOB: Mar 08, 1941, 74 y.o.   MRN: [id number]  HPI 10/04/10-69 yoF never smoker with hx of asthma, left hilar mass/ PET negative.. Last here in 2008 after hospital stay, when she saw [name] noting issues of hypertension and asthma meds. Notes reviewed. Seen now on referral by [name] after recent flare with shortness of breath, productive cough. She tends to feel better as long as she  is on an antibiotic or steroids. Within a couple of weeks off, she will start to cough and get tight again. Had bronchitis as a child. In the last 5-6 years wheezing dyspnea has been more perennial. Occasionally wakes with cough. Sputum often yellow. Has had pneumonia x 2 with pneumovax. Little GERD or acid indigestion recognized. Triggers have included strong odors, seasonal pollens, chest colds, but not house dust. Had a sinus infection during the winter. Has been treated for heart rhythm and BP, but denies MI/ angina. She is using Symbicort 160 now and considers it sufficient used twice every day. She did not fill a script for a Proair rescue inhaler. 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  11/10/10- 69 yoF never smoker with hx of asthma, left hilar mass/ PET neg in 2008/ now progressive, RML atelectasis. CT 11/03/10- showed change progression since prior studies of 2008, when abnormalities / PET negative, were attributed to scarring. -Now 2.6 x 2,2 cm left hilar mass, slowly growing. Post obstructive LLL opacity. New RML collapse. Scattered bronchiectasis. No effusion. Denies fever, night sweat, nodes. Cough has been a little less, with brownish to clear mucus, no blood. Has to chew deliberately to avoid choking. Has rarely awakened with hard cough. Symbicort has helped cough. No weight loss. No chest pain, but some shifting back pain. We reviewed her images and the technique and risks of bronchoscopy.   12/12/10- 69 yoF never smoker with hx of asthma, left hilar mass/ PET neg in 2008/ now progressive, RML atelectasis. PET neg 2012. Husband here  Post bronchoscopy f/u - Bronch 11/30/10- necrotic appearing mass occluding left lower lobe bronchus.  Path- small lymphocytes and eosinophils, neg for malignancy or granuloma. Cultures negative so far.  She still coughs up phlegm, but less than before bronchoscopy.  Aware of a variable nodule in left axilla- comes and goes, otherwise no lumps, nodes or etc. Occasional  sweat on morning waking, otherwise no fever or sweat and no pain or blood. She has been aware "something is going on" but not toxic.   11/23/11-  41 yoF never smoker with hx of asthma, left hilar  mass/ PET neg in 2008/ now progressive, RML atelectasis. PET neg 2012.  She says she is doing "fair". She has had bronchoscopy/ [name] after me ."Benign inflammatory"  Coughs more with stress. She had gone to emergency room June 8 with anxiety and shortness of breath. We subsequently called in prednisone taper. She denies fever, sweat, adenopathy or chest pain. Often short of breath if anxious. She avoids exertion such as climbing stairs. Prednisone helped cough more productive and she feels better having taken it. Phlegm now is yellow, sometimes darker with no blood. She says that "a bug bite escalates my anxiety". Lab-eosinophils 8%, always high. CXR 10/06/11 IMPRESSION:  No acute infiltrate or pulmonary edema. There is a cavitary lesion  in the left lower lobe retrocardiac region measures at least 2 cm.  Further evaluation with enhanced CT of the chest is recommended.  Original Report Authenticated By: Lahoma Crocker, M.D.    12/25/11-  46 yoF never smoker with hx of asthma, left hilar mass/ PET neg in 2008/ now progressive, RML atelectasis. PET neg 2012.  States breathing has gotten better since last ov; review CT with patient in more detail Off prednisone since July 26. Did not need Symbicort. Cough produces some light yellow sputum but she denies fever, sweat, chest pain. She wakes coughing occasionally. CT chest 12/25/11- images reviewed with her: IMPRESSION:  Interval decrease in size of the left infrahilar lesion which has  become air-filled in the interval. It is possible that this could  represent some central cystic bronchiectasis or decompression of  the previous fluid density nodule.  Interval re-expansion of the right middle lobe.  No new or progressive findings.  Original Report  Authenticated By: [name], M.D.   06/24/12- 69 yoF never smoker with hx of asthma, left hilar mass/ PET neg in 2008/ then progressive, RML atelectasis. PET neg 2012.  FOLLOWS FOR: was doing pretty good until today-started having increased SOB ; unsure if wheezing. Family here With persistent questioning we determined that for the past month she had been noticing orthopnea, propping up to sleep. No palpitation ankle edema. Over the last 3 or 4 days she began noting dry throat and some mild low back ache. She had had diarrhea for a few days after church parking in mid February but that had resolved except for some residual urgency. No fever, chills. Sputum usually white, rarely brown. No chest pain, blood or swollen glands  07/08/12 71 yoF never smoker with hx of asthma, left hilar mass/ PET neg in 2008/ then progressive, RML atelectasis. PET neg 2012.    Son here FOLLOWS FOR: states breathing is slightly better since 2 weeks ago; ROOM AIR O2 of 90% upon arrival She feels she is doing better she comes in looking well dressed, stronger and comfortable occasional productive cough, scant clear sputum or little yellow-brown. No blood. Denies chest pain, fever or sweat. She is carrying her oxygen. Labs 06/24/12- CBC normaal except eosinophilia 9%. LFTs normal. Chemistry normal except CO2 34 suggesting compensation for respiratory acidosis. CT chest 06/26/12- images were reviewed with her and her son. IMPRESSION:  1. No evidence for acute pulmonary embolus.  2. Ground-glass attenuation and peripheral nodular consolidation  within the right lower lobe consistent with pneumonitis. Findings  likely sequela of aspiration and/or pneumonia.  3. Cavitary lesion versus cystic bronchiectasis within the  perihilar left lower lobe. That this is unchanged in size from  previous exam but now contains a central soft tissue attenuating  filling  defect which may represent a mycetoma.  4. Diffuse, bilateral bronchial  wall thickening which is favored  to represent the sequela of chronic aspiration and bronchitis.  Original Report Authenticated By: Kerby Moors, M.D.  08/12/12- 63 yoF never smoker with hx of asthma, left hilar mass/ PET neg in 2008/ then progressive, RML atelectasis. PET neg 2012.    Son here FOLLOWS FOR: review Barium swallow results in detail with patient; having SOB and wheezing-worse with activity She reports feeling much better compared with last winter. Still some cough and shortness of breath white or slightly brown sputum. Dyspnea with vigorous exertion/stairs. Denies sweats or fever, chest pain or palpitation. Steroids make her feel better. Sleeps with O2 2L/ [facility]. Barium swallow 07/15/12- negative for evidence of reflux or aspiration.  09/19/12- 29 yoF never smoker with hx of asthma/ bronchiectasis, left hilar mass/cyst PET neg in 2008/ then progressive, RML atelectasis. PET neg 2012.     FOLLOWS FOR: dry nasal area, watery eyes, cough that just started today. Strong perfume made her nose run. Eyes fdry CXR 08/22/12 IMPRESSION:  No edema or consolidation. The lesions seen recently on CT in the  left infrahilar region is not seen on chest radiography. In this  regard, suggest correlation with chest CT to further assess this  area on the left.  Original Report Authenticated By: Lowella Grip, M.D.  07/08/14- 60 yoF never smoker with hx of asthma/ bronchiectasis, left hilar mass/cyst PET neg in 2008/ then progressive, RML atelectasis. PET neg 2012. Female companion here FOLLOWS FOR/ACUTE VISIT: Last seen 2014; having increased mucus-hard to get up; SOB and wheezing-worse with activity. No fevers however slight warmness at times. Waking more frequently with cough and choking that sounds like reflux events. Difficult for her to cough out phlegm. Denies fever or sweat. Admits shortness of breath particularly with exertion. Sleeps with head up on pillows. CT chest 06/26/12- reviewed with  her IMPRESSION: 1. No evidence for acute pulmonary embolus. 2. Ground-glass attenuation and peripheral nodular consolidation within the right lower lobe consistent with pneumonitis. Findings likely sequela of aspiration and/or pneumonia. 3. Cavitary lesion versus cystic bronchiectasis within the perihilar left lower lobe. That this is unchanged in size from previous exam but now contains a central soft tissue attenuating filling defect which may represent a mycetoma. 4. Diffuse, bilateral bronchial wall thickening which is favored to represent the sequela of chronic aspiration and bronchitis. Original Report Authenticated By: Kerby Moors, M.D. CXR 02/18/14 IMPRESSION: Nodular opacities and persistent collapse of right middle lung. Suggest CT to exclude neoplasm. Diffuse emphysematous changes in the lungs. Electronically Signed  By: Lucienne Capers M.D.  On: 02/18/2014 04:51 Office spirometry 07/08/2014 Pre and postbronchodilator: Very severe obstructive airways disease with severe restriction of FVC and insignificant response to bronchodilator. FVC 0.90/41%, FEV1 0.44/27%, FEV1/FVC 0.49  09/06/14- 73 yoF never smoker with hx of asthma/ bronchiectasis, retained secretions, left hilar mass/cyst PET neg in 2008/ then progressive, RML atelectasis. PET neg 2012   husband here FOLLOWS FOR: was seen at [facility] ED 5/2 last week for bronchospasms.  pt today c/o prod cough with lots of yellow mucus. ER gave nebulizer treatment, prednisone and Z-Pak.  Got ? Chest cold. CXR 08/29/14- My review> note atelctatic areas have cleared  IMPRESSION: Hyperinflated lungs with chronic bronchitic markings. No acute findings. Electronically Signed  By: Suzy Bouchard M.D.  On: 08/29/2014 22:00  10/12/14- 73 yoF never smoker with hx of asthma/ bronchiectasis, retained secretions in bronchiectatic airways creating pseudotumors, left hilar mass/cyst PET  neg in 2008, RML atelectasis. PET neg 2012 ,  multiple bronchoscopies.   husband here ACUTE VISIT: Pt called stating she has used her nebulizer about 10 times since last night without relief; comes and goes. Pt having increased SOB and wheezing,cough-small amounts of phelgm-beige in color. Pt was "hot" but never checked for fever. She had called [place] June 7 reporting 3 days of wheeze and malaise. We sent doxycycline and 3 days of prednisone. She has been using her nebulizer regularly and stopped Symbicort. As soon as she ran out of prednisone  wheezing got worse. No fever, chills, chest pain or purulent sputum and no sudden choking events. Using home oxygen at 2 L.  01/06/15-    73 yoF never smoker with hx of asthma/ bronchiectasis, retained secretions in bronchiectatic airways creating pseudotumors, left hilar mass/cyst PET neg in 2008, RML atelectasis. PET neg 2012 , multiple bronchoscopies. ACUTE: pt c/o wheezing , coughing, heavy legs since monday.  increase in SOB.  pt c/o of productive cough clear in color.  She declines flu vaccine She had written to [facility] for Labor Day weekend and on return had developed increased cough with clear mucus, tiredness, GI upset with constipation. Using her nebulizer machine with albuterol. Asks a "liquid antibiotic". She had had a Depo-Medrol injection in June and wanted to wait longer before repeating that. CXR 10/12/14 There is peribronchial thickening particularly in the right middle lobe. No consolidative infiltrates or atelectasis or effusions. Heart size and vascularity are normal. No osseous abnormality. IMPRESSION: Bronchitic changes, most prominent in the right middle lobe. Electronically Signed  By: Lorriane Shire M.D.  On: 10/12/2014 13:55  ROS-see HPI Constitutional:   No-   weight loss, night sweats, fevers, chills, fatigue, lassitude. HEENT:   No-  headaches, difficulty swallowing, tooth/dental problems, sore throat,       No-  sneezing, itching, ear ache, nasal congestion, post nasal  drip,  CV:  No-   chest pain, +orthopnea, no-PND, swelling in lower extremities, anasarca,  dizziness, palpitations Resp: +   shortness of breath with exertion or at rest.             + productive cough,  + non-productive cough,  No- coughing up of blood.              +change in color of mucus. +wheezing.   Skin: No-   rash or lesions. GI:  No-   heartburn, indigestion, abdominal pain, nausea, vomiting,  GU: . MS:  No-   joint pain or swelling.   Neuro-     nothing unusual Psych:  No- change in mood or affect. No depression or anxiety.  No memory loss.  OBJ- Physical Exam General- Alert, Oriented, Affect-appropriate, Distress- none acute. Thin Skin- rash-none, lesions- none, excoriation- none   Lymphadenopathy- none Head- atraumatic            Eyes- Gross vision intact, PERRLA, conjunctivae and secretions clear            Ears- +somewhat hard of hearing            Nose- Clear, no-Septal dev, mucus, polyps, erosion, perforation             Throat- Mallampati II , mucosa clear , drainage- none, tonsils- atrophic Neck- flexible , trachea midline, no stridor , thyroid nl, carotid no bruit Chest - symmetrical excursion , unlabored           Heart/CV- RRR , no murmur , no gallop  ,  no rub, nl s1 s2                           - JVD- none , edema- none, stasis changes- none, varices- none           Lung- + crackles , unlabored, cough -none, dullness-none, rub- none           Chest wall-  Abd- Br/ Gen/ Rectal- Not done, not indicated Extrem- cyanosis- none, clubbing, none, atrophy- none, strength- nl Neuro- grossly intact to observation

## 2015-01-06 NOTE — Patient Instructions (Signed)
Hopefully extra liquids and, if needed, a mild laxative, will get you feeling better  Script printed for avelox antibiotic to use if needed

## 2015-01-11 ENCOUNTER — Encounter (HOSPITAL_COMMUNITY): Payer: Commercial Managed Care - HMO

## 2015-01-11 ENCOUNTER — Encounter (HOSPITAL_COMMUNITY)
Admission: RE | Admit: 2015-01-11 | Discharge: 2015-01-11 | Disposition: A | Discharge status: Home / Self Care | Payer: Commercial Managed Care - HMO | Source: Ambulatory Visit | Attending: Internal Medicine | Admitting: Internal Medicine

## 2015-01-11 DIAGNOSIS — J479 Bronchiectasis, uncomplicated: Secondary | ICD-10-CM | POA: Diagnosis not present

## 2015-01-11 NOTE — Progress Notes (Signed)
Today, Suha exercised at [employer]. [facility]. Service time was from 10:30am to 12:15pm.  The patient exercised by performing aerobic, strengthening, and stretching exercises. Oxygen saturation, heart rate, blood pressure, rate of perceived exertion, and shortness of breath were all monitored before, during, and after exercise. Quiera presented with no problems at today's exercise session.  The patient did not have an increase in workload intensity during today's exercise session.  Pre-exercise vitals: . Weight kg: 57.8 . Liters of O2: ra . SpO2: 94 . HR: 88 . BP: 122/60 . CBG: na  Exercise vitals: . Highest heartrate:  117 . Lowest oxygen saturation: 86 . Highest blood pressure: 134/76 . Liters of 02: ra  Post-exercise vitals: . SpO2: 90 . HR: 98 . BP: 104/60 . Liters of O2: ra . CBG: na  Dr. Brand Males, Medical Director Dr. Candiss Norse is immediately available during today's Pulmonary Rehab session for Elyn Aquas on 12/30/14 at 10:30am class time.

## 2015-01-13 ENCOUNTER — Encounter (HOSPITAL_COMMUNITY): Payer: Commercial Managed Care - HMO

## 2015-01-13 ENCOUNTER — Encounter (HOSPITAL_COMMUNITY)
Admission: RE | Admit: 2015-01-13 | Discharge: 2015-01-13 | Disposition: A | Discharge status: Home / Self Care | Payer: Commercial Managed Care - HMO | Source: Ambulatory Visit | Attending: Internal Medicine | Admitting: Internal Medicine

## 2015-01-13 NOTE — Psychosocial Assessment (Signed)
[name] 74 y.o. female  53 day Psychosocial Note  Patient psychosocial assessment reveals no barriers to participation in Pulmonary Rehab. She has attended all but one exercise session in the last 30 days. She has had a mild upper respiratory infection that required treatment with antibiotics, and rest. Patient does continue to exhibit positive coping skills to deal with any psychosocial concerns that may arries. She states she was a little disappointed that she was sick again, but also remembered her disease is chronic and flare-ups are going to happen. She is encouraged that she better managed this "flare up" by going to the doctor in a timely manner and remembering not to panic when she became more short of breath. She credits the education she has received in the program. In the past she would not go to the doctor for "mild colds" but would eventually be hospitalized. Offered emotional support and reassurance. Patient does feel she is making progress toward Pulmonary Rehab goals. Patient reports her health and activity level continues to improve as evidenced by patient's report of increased ability to attend social events and cook meals for she and her husband. Patient states family/friends have noticed changes in her activity or mood as displayed by her increased confidence with managing her pulmonary disease. Patient reports feeling positive about current and projected progression in Pulmonary Rehab. After reviewing the patient's treatment plan, the patient is making progress toward Pulmonary Rehab goals. Patient's rate of progress toward rehab goals is excellent. Plan of action to help patient continue to work towards rehab goals include increasing workloads as tolerated on equipment in order to increase stamina and stregnth. Will continue to monitor and evaluate progress toward psychosocial goal(s).  Goal(s) in progress: Continue to work on improved management of anxiety  Help patient work  toward returning to meaningful activities that improve patient's QOL and are attainable with patient's lung disease

## 2015-01-18 ENCOUNTER — Encounter (HOSPITAL_COMMUNITY): Payer: Commercial Managed Care - HMO

## 2015-01-18 ENCOUNTER — Encounter (HOSPITAL_COMMUNITY): Admission: RE | Admit: 2015-01-18 | Payer: Commercial Managed Care - HMO | Source: Ambulatory Visit

## 2015-01-20 ENCOUNTER — Encounter (HOSPITAL_COMMUNITY): Payer: Commercial Managed Care - HMO

## 2015-01-24 ENCOUNTER — Telehealth: Payer: Self-pay | Admitting: Internal Medicine

## 2015-01-24 MED ORDER — DOXYCYCLINE HYCLATE 100 MG PO TABS: 100.0000 mg | ORAL_TABLET | Freq: Two times a day (BID) | ORAL | Status: DC

## 2015-01-24 NOTE — Telephone Encounter (Signed)
Patient has been sluggish, fatigued, struggling to breath, productive cough, coughing up light yellow mucus.  Main concern is that she is very sob and struggling to breath especially when she lays down to go to bed at night.  Patient wants something called in.  [facility] [address]  Current Outpatient Prescriptions on File Prior to Visit  Medication Sig Dispense Refill  . acetaminophen (TYLENOL) 325 MG tablet Take 325 mg by mouth every 6 (six) hours as needed. For pain    . albuterol (PROAIR HFA) 108 (90 BASE) MCG/ACT inhaler Inhale 2 puffs into the lungs every 4 (four) hours as needed for wheezing or shortness of breath. For shortness of breath 1 Inhaler 0  . albuterol (PROVENTIL) (2.5 MG/3ML) 0.083% nebulizer solution Take 3 mLs (2.5 mg total) by nebulization every 4 (four) hours as needed for wheezing or shortness of breath. 75 mL 5  . Ascorbic Acid (VITAMIN C PO) Take 1 tablet by mouth daily.     . B Complex Vitamins (VITAMIN B COMPLEX PO) Take 1 tablet by mouth daily as needed (takes when she remembers to take it).     . budesonide-formoterol (SYMBICORT) 160-4.5 MCG/ACT inhaler Inhale 1 puff into the lungs 2 (two) times daily.    . cholecalciferol (VITAMIN D) 1000 UNITS tablet Take 1,000 Units by mouth daily.    Marland Kitchen diltiazem (CARDIZEM) 120 MG tablet Take 1 tablet (120 mg total) by mouth 2 (two) times daily. 60 tablet 11  . diphenhydrAMINE (BENADRYL) 25 MG tablet Take 25 mg by mouth every 6 (six) hours as needed for allergies.    Marland Kitchen loratadine (CLARITIN) 10 MG tablet Take 10 mg by mouth daily as needed for allergies.     Marland Kitchen moxifloxacin (AVELOX) 400 MG tablet 1 daily in applesauce 5 tablet 0  . Multiple Vitamin (MULTIVITAMIN) tablet Take 1 tablet by mouth daily.      Marland Kitchen Respiratory Therapy Supplies (FLUTTER) DEVI Blow through 4 times per set and repeat 3 sets per day, to loosen lung secretions. 1 each 0   No current facility-administered medications on file prior to visit.   Allergies   Allergen Reactions  . Penicillins Swelling    Throat swelling  . Shellfish Allergy Anaphylaxis  . Sulfonamide Derivatives Swelling    Throat swelling  . Aspirin Other (See Comments)    States stomach bubbles, becomes gaseous and irritated  . Azithromycin     "makes me gag"  . Bee Venom   . Clindamycin     REACTION: Neck, tongue swelling, SOB \\T \ rash  . Fluticasone-Salmeterol     REACTION: hoarseness  . Fruit & Vegetable Daily [Nutritional Supplements]     Tongue swelling, vomiting  . Latex Itching and Swelling  . Montelukast Sodium     REACTION: hallucination  . Ventolin [Albuterol]     cough

## 2015-01-24 NOTE — Telephone Encounter (Signed)
Offer doxycycline 100 mg, # 14, 1 twice daily 

## 2015-01-24 NOTE — Telephone Encounter (Signed)
Called and spoke to pt. Informed her of the recs per CY. Rx sent to preferred pharmacy. Pt verbalized understanding and denied any further questions or concerns at this time.   

## 2015-01-25 ENCOUNTER — Encounter (HOSPITAL_COMMUNITY): Payer: Commercial Managed Care - HMO

## 2015-01-25 ENCOUNTER — Encounter (HOSPITAL_COMMUNITY): Admission: RE | Admit: 2015-01-25 | Payer: Commercial Managed Care - HMO | Source: Ambulatory Visit

## 2015-01-26 ENCOUNTER — Other Ambulatory Visit (INDEPENDENT_AMBULATORY_CARE_PROVIDER_SITE_OTHER): Payer: Commercial Managed Care - HMO

## 2015-01-26 ENCOUNTER — Ambulatory Visit (INDEPENDENT_AMBULATORY_CARE_PROVIDER_SITE_OTHER)
Admission: RE | Admit: 2015-01-26 | Discharge: 2015-01-26 | Disposition: A | Discharge status: Home / Self Care | Payer: Commercial Managed Care - HMO | Source: Ambulatory Visit | Attending: Internal Medicine | Admitting: Internal Medicine

## 2015-01-26 ENCOUNTER — Ambulatory Visit (INDEPENDENT_AMBULATORY_CARE_PROVIDER_SITE_OTHER): Payer: Commercial Managed Care - HMO | Admitting: Internal Medicine

## 2015-01-26 ENCOUNTER — Encounter: Payer: Self-pay | Admitting: Internal Medicine

## 2015-01-26 ENCOUNTER — Telehealth: Payer: Self-pay | Admitting: Internal Medicine

## 2015-01-26 VITALS — BP 124/72 | HR 95 | Ht 62.0 in | Wt 123.4 lb

## 2015-01-26 DIAGNOSIS — J471 Bronchiectasis with (acute) exacerbation: Secondary | ICD-10-CM

## 2015-01-26 DIAGNOSIS — J4530 Mild persistent asthma, uncomplicated: Secondary | ICD-10-CM

## 2015-01-26 LAB — CBC WITH DIFFERENTIAL/PLATELET
Basophils Absolute: 0.1 10*3/uL (ref 0.0–0.1)
Basophils Relative: 1.4 % (ref 0.0–3.0)
EOS PCT: 13.6 % — AB (ref 0.0–5.0)
Eosinophils Absolute: 1.1 10*3/uL — ABNORMAL HIGH (ref 0.0–0.7)
HCT: 43.5 % (ref 36.0–46.0)
HEMOGLOBIN: 14.4 g/dL (ref 12.0–15.0)
Lymphocytes Relative: 23.3 % (ref 12.0–46.0)
Lymphs Abs: 1.9 10*3/uL (ref 0.7–4.0)
MCHC: 33.2 g/dL (ref 30.0–36.0)
MCV: 90.4 fl (ref 78.0–100.0)
Monocytes Absolute: 0.8 10*3/uL (ref 0.1–1.0)
Monocytes Relative: 9.9 % (ref 3.0–12.0)
Neutro Abs: 4.2 10*3/uL (ref 1.4–7.7)
Neutrophils Relative %: 51.8 % (ref 43.0–77.0)
Platelets: 435 10*3/uL — ABNORMAL HIGH (ref 150.0–400.0)
RBC: 4.81 Mil/uL (ref 3.87–5.11)
RDW: 13.5 % (ref 11.5–15.5)
WBC: 8.1 10*3/uL (ref 4.0–10.5)

## 2015-01-26 LAB — SEDIMENTATION RATE: SED RATE: 79 mm/h — AB (ref 0–22)

## 2015-01-26 LAB — C-REACTIVE PROTEIN: CRP: 6.3 mg/dL (ref 0.5–20.0)

## 2015-01-26 MED ORDER — METHYLPREDNISOLONE ACETATE 80 MG/ML IJ SUSP
80.0000 mg | Freq: Once | INTRAMUSCULAR | Status: AC
  Administered 2015-01-26: 80 mg via INTRAMUSCULAR

## 2015-01-26 MED ORDER — ARFORMOTEROL TARTRATE 15 MCG/2ML IN NEBU
15.0000 ug | INHALATION_SOLUTION | Freq: Once | RESPIRATORY_TRACT | Status: AC
  Administered 2015-01-26: 15 ug via RESPIRATORY_TRACT

## 2015-01-26 NOTE — Telephone Encounter (Signed)
Patient says she can be here at 11:15.   Patient scheduled. FYI to [employer]

## 2015-01-26 NOTE — Assessment & Plan Note (Signed)
More active wheezing than usual at this visit. Plan-try a longer acting nebulizer bronchodilator for better control, with Depo-Medrol this visit.

## 2015-01-26 NOTE — Progress Notes (Signed)
Subjective:    Patient ID: [name], female    DOB: [birth year], 74 y.o.   MRN: [id number]  HPI 10/04/10-69 yoF never smoker with hx of asthma, left hilar mass/ PET negative.. Last here in 2008 after hospital stay, when she saw [name] noting issues of hypertension and asthma meds. Notes reviewed. Seen now on referral by [name] after recent flare with shortness of breath, productive cough. She tends to feel better as long as she is on an antibiotic or steroids. Within a couple of weeks off, she will start to cough and get tight again. Had bronchitis as a child. In the last 5-6 years wheezing dyspnea has been more perennial. Occasionally wakes with cough. Sputum often yellow. Has had pneumonia x 2 with pneumovax. Little GERD or acid indigestion recognized. Triggers have included strong odors, seasonal pollens, chest colds, but not house dust. Had a sinus infection during the winter. Has been treated for heart rhythm and BP, but denies MI/ angina. She is using Symbicort 160 now and considers it sufficient used twice every day. She did not fill a script for a Proair rescue inhaler. 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  11/10/10- 69 yoF never smoker with hx of asthma, left hilar mass/ PET neg in 2008/ now progressive, RML atelectasis. CT 11/03/10- showed change progression since prior studies of 2008, when abnormalities / PET negative, were attributed to scarring. -Now 2.6 x 2,2 cm left hilar mass, slowly growing. Post obstructive LLL opacity. New RML collapse. Scattered bronchiectasis. No effusion. Denies fever, night sweat, nodes. Cough has been a little less, with brownish to clear mucus, no blood. Has to chew deliberately to avoid choking. Has rarely awakened with hard cough. Symbicort has helped cough. No weight loss. No chest pain, but some shifting back pain. We reviewed her images and the technique and risks of bronchoscopy.   12/12/10- 69 yoF never smoker with hx of asthma, left hilar mass/ PET  neg in 2008/ now progressive, RML atelectasis. PET neg 2012. Husband here  Post bronchoscopy f/u - Bronch 11/30/10- necrotic appearing mass occluding left lower lobe bronchus.  Path- small lymphocytes and eosinophils, neg for malignancy or granuloma. Cultures negative so far.  She still coughs up phlegm, but less than before bronchoscopy.  Aware of a variable nodule in left axilla- comes and goes, otherwise no lumps, nodes or etc. Occasional sweat on morning waking, otherwise no fever or sweat and no pain or blood. She has been aware "something is going on" but not toxic.   11/23/11-  25 yoF never smoker with hx of asthma, left hilar mass/ PET neg in 2008/ now progressive, RML atelectasis. PET neg 2012.  She says she is doing "fair". She has had bronchoscopy/ [name] after me ."Benign inflammatory"  Coughs more with stress. She had gone to emergency room June 8 with anxiety and shortness of breath. We subsequently called in prednisone taper. She denies fever, sweat, adenopathy or chest pain. Often short of breath if anxious. She avoids exertion such as climbing stairs. Prednisone helped cough more productive and she feels better having taken it. Phlegm now is yellow, sometimes darker with no blood. She says that "a bug bite escalates my anxiety". Lab-eosinophils 8%, always high. CXR 10/06/11 IMPRESSION:  No acute infiltrate or pulmonary edema. There is a cavitary lesion  in the left lower lobe retrocardiac region measures at least 2 cm.  Further evaluation with enhanced CT of the chest is recommended.  Original Report Authenticated By: [name]  POP, M.D.    12/25/11-  77 yoF never smoker with hx of asthma, left hilar mass/ PET neg in 2008/ now progressive, RML atelectasis. PET neg 2012.  States breathing has gotten better since last ov; review CT with patient in more detail Off prednisone since July 26. Did not need Symbicort. Cough produces some light yellow sputum but she denies fever, sweat, chest  pain. She wakes coughing occasionally. CT chest 12/25/11- images reviewed with her: IMPRESSION:  Interval decrease in size of the left infrahilar lesion which has  become air-filled in the interval. It is possible that this could  represent some central cystic bronchiectasis or decompression of  the previous fluid density nodule.  Interval re-expansion of the right middle lobe.  No new or progressive findings.  Original Report Authenticated By: [name], M.D.   06/24/12- 83 yoF never smoker with hx of asthma, left hilar mass/ PET neg in 2008/ then progressive, RML atelectasis. PET neg 2012.  FOLLOWS FOR: was doing pretty good until today-started having increased SOB ; unsure if wheezing. Family here With persistent questioning we determined that for the past month she had been noticing orthopnea, propping up to sleep. No palpitation ankle edema. Over the last 3 or 4 days she began noting dry throat and some mild low back ache. She had had diarrhea for a few days after church parking in mid February but that had resolved except for some residual urgency. No fever, chills. Sputum usually white, rarely brown. No chest pain, blood or swollen glands  07/08/12 71 yoF never smoker with hx of asthma, left hilar mass/ PET neg in 2008/ then progressive, RML atelectasis. PET neg 2012.    Son here FOLLOWS FOR: states breathing is slightly better since 2 weeks ago; ROOM AIR O2 of 90% upon arrival She feels she is doing better she comes in looking well dressed, stronger and comfortable occasional productive cough, scant clear sputum or little yellow-brown. No blood. Denies chest pain, fever or sweat. She is carrying her oxygen. Labs 06/24/12- CBC normaal except eosinophilia 9%. LFTs normal. Chemistry normal except CO2 34 suggesting compensation for respiratory acidosis. CT chest 06/26/12- images were reviewed with her and her son. IMPRESSION:  1. No evidence for acute pulmonary embolus.  2. Ground-glass  attenuation and peripheral nodular consolidation  within the right lower lobe consistent with pneumonitis. Findings  likely sequela of aspiration and/or pneumonia.  3. Cavitary lesion versus cystic bronchiectasis within the  perihilar left lower lobe. That this is unchanged in size from  previous exam but now contains a central soft tissue attenuating  filling defect which may represent a mycetoma.  4. Diffuse, bilateral bronchial wall thickening which is favored  to represent the sequela of chronic aspiration and bronchitis.  Original Report Authenticated By: Kerby Moors, M.D.  08/12/12- 16 yoF never smoker with hx of asthma, left hilar mass/ PET neg in 2008/ then progressive, RML atelectasis. PET neg 2012.    Son here FOLLOWS FOR: review Barium swallow results in detail with patient; having SOB and wheezing-worse with activity She reports feeling much better compared with last winter. Still some cough and shortness of breath white or slightly brown sputum. Dyspnea with vigorous exertion/stairs. Denies sweats or fever, chest pain or palpitation. Steroids make her feel better. Sleeps with O2 2L/ [facility]. Barium swallow 07/15/12- negative for evidence of reflux or aspiration.  09/19/12- 55 yoF never smoker with hx of asthma/ bronchiectasis, left hilar mass/cyst PET neg in 2008/ then progressive, RML  atelectasis. PET neg 2012.     FOLLOWS FOR: dry nasal area, watery eyes, cough that just started today. Strong perfume made her nose run. Eyes fdry CXR 08/22/12 IMPRESSION:  No edema or consolidation. The lesions seen recently on CT in the  left infrahilar region is not seen on chest radiography. In this  regard, suggest correlation with chest CT to further assess this  area on the left.  Original Report Authenticated By: Lowella Grip, M.D.  07/08/14- 58 yoF never smoker with hx of asthma/ bronchiectasis, left hilar mass/cyst PET neg in 2008/ then progressive, RML atelectasis. PET neg 2012.  Female companion here FOLLOWS FOR/ACUTE VISIT: Last seen 2014; having increased mucus-hard to get up; SOB and wheezing-worse with activity. No fevers however slight warmness at times. Waking more frequently with cough and choking that sounds like reflux events. Difficult for her to cough out phlegm. Denies fever or sweat. Admits shortness of breath particularly with exertion. Sleeps with head up on pillows. CT chest 06/26/12- reviewed with her IMPRESSION: 1. No evidence for acute pulmonary embolus. 2. Ground-glass attenuation and peripheral nodular consolidation within the right lower lobe consistent with pneumonitis. Findings likely sequela of aspiration and/or pneumonia. 3. Cavitary lesion versus cystic bronchiectasis within the perihilar left lower lobe. That this is unchanged in size from previous exam but now contains a central soft tissue attenuating filling defect which may represent a mycetoma. 4. Diffuse, bilateral bronchial wall thickening which is favored to represent the sequela of chronic aspiration and bronchitis. Original Report Authenticated By: Kerby Moors, M.D. CXR 02/18/14 IMPRESSION: Nodular opacities and persistent collapse of right middle lung. Suggest CT to exclude neoplasm. Diffuse emphysematous changes in the lungs. Electronically Signed  By: Lucienne Capers M.D.  On: 02/18/2014 04:51 Office spirometry 07/08/2014 Pre and postbronchodilator: Very severe obstructive airways disease with severe restriction of FVC and insignificant response to bronchodilator. FVC 0.90/41%, FEV1 0.44/27%, FEV1/FVC 0.49  ROS-see HPI Constitutional:   No-   weight loss, night sweats, fevers, chills, fatigue, lassitude. HEENT:   No-  headaches, difficulty swallowing, tooth/dental problems, sore throat,       No-  sneezing, itching, ear ache, nasal congestion, post nasal drip,  CV:  No-   chest pain, +orthopnea, no-PND, swelling in lower extremities, anasarca,  dizziness,  palpitations Resp: +   shortness of breath with exertion or at rest.              +  productive cough,  + non-productive cough,  No- coughing up of blood.              No-change in color of mucus.  No- wheezing.   Skin: No-   rash or lesions. GI:  No-   heartburn, indigestion, abdominal pain, nausea, vomiting,  GU: . MS:  No-   joint pain or swelling.   Neuro-     nothing unusual Psych:  No- change in mood or affect. No depression or anxiety.  No memory loss.  OBJ- Physical Exam General- Alert, Oriented, Affect-appropriate, Distress- none acute. Thin Skin- rash-none, lesions- none, excoriation- none   Lymphadenopathy- none Head- atraumatic            Eyes- Gross vision intact, PERRLA, conjunctivae and secretions clear            Ears- +somewhat hard of hearing            Nose- Clear, no-Septal dev, mucus, polyps, erosion, perforation  Throat- Mallampati II , mucosa clear , drainage- none, tonsils- atrophic Neck- flexible , trachea midline, no stridor , thyroid nl, carotid no bruit Chest - symmetrical excursion , unlabored           Heart/CV- RRR , no murmur , no gallop  , no rub, nl s1 s2                           - JVD- none , edema- none, stasis changes- none, varices- none           Lung- + wheeze and squeaks, unlabored, cough- none , dullness-none, rub- none           Chest wall-  Abd- Br/ Gen/ Rectal- Not done, not indicated Extrem- cyanosis- none, clubbing, none, atrophy- none, strength- nl Neuro- grossly intact to observation     Subjective:    Patient ID: [name], female    DOB: Mar 08, 1941, 74 y.o.   MRN: [id number]  HPI 10/04/10-69 yoF never smoker with hx of asthma, left hilar mass/ PET negative.. Last here in 2008 after hospital stay, when she saw [name] noting issues of hypertension and asthma meds. Notes reviewed. Seen now on referral by [name] after recent flare with shortness of breath, productive cough. She tends to feel better as long as she  is on an antibiotic or steroids. Within a couple of weeks off, she will start to cough and get tight again. Had bronchitis as a child. In the last 5-6 years wheezing dyspnea has been more perennial. Occasionally wakes with cough. Sputum often yellow. Has had pneumonia x 2 with pneumovax. Little GERD or acid indigestion recognized. Triggers have included strong odors, seasonal pollens, chest colds, but not house dust. Had a sinus infection during the winter. Has been treated for heart rhythm and BP, but denies MI/ angina. She is using Symbicort 160 now and considers it sufficient used twice every day. She did not fill a script for a Proair rescue inhaler. 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  11/10/10- 69 yoF never smoker with hx of asthma, left hilar mass/ PET neg in 2008/ now progressive, RML atelectasis. CT 11/03/10- showed change progression since prior studies of 2008, when abnormalities / PET negative, were attributed to scarring. -Now 2.6 x 2,2 cm left hilar mass, slowly growing. Post obstructive LLL opacity. New RML collapse. Scattered bronchiectasis. No effusion. Denies fever, night sweat, nodes. Cough has been a little less, with brownish to clear mucus, no blood. Has to chew deliberately to avoid choking. Has rarely awakened with hard cough. Symbicort has helped cough. No weight loss. No chest pain, but some shifting back pain. We reviewed her images and the technique and risks of bronchoscopy.   12/12/10- 69 yoF never smoker with hx of asthma, left hilar mass/ PET neg in 2008/ now progressive, RML atelectasis. PET neg 2012. Husband here  Post bronchoscopy f/u - Bronch 11/30/10- necrotic appearing mass occluding left lower lobe bronchus.  Path- small lymphocytes and eosinophils, neg for malignancy or granuloma. Cultures negative so far.  She still coughs up phlegm, but less than before bronchoscopy.  Aware of a variable nodule in left axilla- comes and goes, otherwise no lumps, nodes or etc. Occasional  sweat on morning waking, otherwise no fever or sweat and no pain or blood. She has been aware "something is going on" but not toxic.   11/23/11-  41 yoF never smoker with hx of asthma, left hilar  mass/ PET neg in 2008/ now progressive, RML atelectasis. PET neg 2012.  She says she is doing "fair". She has had bronchoscopy/ [name] after me ."Benign inflammatory"  Coughs more with stress. She had gone to emergency room June 8 with anxiety and shortness of breath. We subsequently called in prednisone taper. She denies fever, sweat, adenopathy or chest pain. Often short of breath if anxious. She avoids exertion such as climbing stairs. Prednisone helped cough more productive and she feels better having taken it. Phlegm now is yellow, sometimes darker with no blood. She says that "a bug bite escalates my anxiety". Lab-eosinophils 8%, always high. CXR 10/06/11 IMPRESSION:  No acute infiltrate or pulmonary edema. There is a cavitary lesion  in the left lower lobe retrocardiac region measures at least 2 cm.  Further evaluation with enhanced CT of the chest is recommended.  Original Report Authenticated By: Lahoma Crocker, M.D.    12/25/11-  46 yoF never smoker with hx of asthma, left hilar mass/ PET neg in 2008/ now progressive, RML atelectasis. PET neg 2012.  States breathing has gotten better since last ov; review CT with patient in more detail Off prednisone since July 26. Did not need Symbicort. Cough produces some light yellow sputum but she denies fever, sweat, chest pain. She wakes coughing occasionally. CT chest 12/25/11- images reviewed with her: IMPRESSION:  Interval decrease in size of the left infrahilar lesion which has  become air-filled in the interval. It is possible that this could  represent some central cystic bronchiectasis or decompression of  the previous fluid density nodule.  Interval re-expansion of the right middle lobe.  No new or progressive findings.  Original Report  Authenticated By: [name], M.D.   06/24/12- 69 yoF never smoker with hx of asthma, left hilar mass/ PET neg in 2008/ then progressive, RML atelectasis. PET neg 2012.  FOLLOWS FOR: was doing pretty good until today-started having increased SOB ; unsure if wheezing. Family here With persistent questioning we determined that for the past month she had been noticing orthopnea, propping up to sleep. No palpitation ankle edema. Over the last 3 or 4 days she began noting dry throat and some mild low back ache. She had had diarrhea for a few days after church parking in mid February but that had resolved except for some residual urgency. No fever, chills. Sputum usually white, rarely brown. No chest pain, blood or swollen glands  07/08/12 71 yoF never smoker with hx of asthma, left hilar mass/ PET neg in 2008/ then progressive, RML atelectasis. PET neg 2012.    Son here FOLLOWS FOR: states breathing is slightly better since 2 weeks ago; ROOM AIR O2 of 90% upon arrival She feels she is doing better she comes in looking well dressed, stronger and comfortable occasional productive cough, scant clear sputum or little yellow-brown. No blood. Denies chest pain, fever or sweat. She is carrying her oxygen. Labs 06/24/12- CBC normaal except eosinophilia 9%. LFTs normal. Chemistry normal except CO2 34 suggesting compensation for respiratory acidosis. CT chest 06/26/12- images were reviewed with her and her son. IMPRESSION:  1. No evidence for acute pulmonary embolus.  2. Ground-glass attenuation and peripheral nodular consolidation  within the right lower lobe consistent with pneumonitis. Findings  likely sequela of aspiration and/or pneumonia.  3. Cavitary lesion versus cystic bronchiectasis within the  perihilar left lower lobe. That this is unchanged in size from  previous exam but now contains a central soft tissue attenuating  filling  defect which may represent a mycetoma.  4. Diffuse, bilateral bronchial  wall thickening which is favored  to represent the sequela of chronic aspiration and bronchitis.  Original Report Authenticated By: Kerby Moors, M.D.  08/12/12- 9 yoF never smoker with hx of asthma, left hilar mass/ PET neg in 2008/ then progressive, RML atelectasis. PET neg 2012.    Son here FOLLOWS FOR: review Barium swallow results in detail with patient; having SOB and wheezing-worse with activity She reports feeling much better compared with last winter. Still some cough and shortness of breath white or slightly brown sputum. Dyspnea with vigorous exertion/stairs. Denies sweats or fever, chest pain or palpitation. Steroids make her feel better. Sleeps with O2 2L/ [facility]. Barium swallow 07/15/12- negative for evidence of reflux or aspiration.  09/19/12- 81 yoF never smoker with hx of asthma/ bronchiectasis, left hilar mass/cyst PET neg in 2008/ then progressive, RML atelectasis. PET neg 2012.     FOLLOWS FOR: dry nasal area, watery eyes, cough that just started today. Strong perfume made her nose run. Eyes fdry CXR 08/22/12 IMPRESSION:  No edema or consolidation. The lesions seen recently on CT in the  left infrahilar region is not seen on chest radiography. In this  regard, suggest correlation with chest CT to further assess this  area on the left.  Original Report Authenticated By: Lowella Grip, M.D.  07/08/14- 81 yoF never smoker with hx of asthma/ bronchiectasis, left hilar mass/cyst PET neg in 2008/ then progressive, RML atelectasis. PET neg 2012. Female companion here FOLLOWS FOR/ACUTE VISIT: Last seen 2014; having increased mucus-hard to get up; SOB and wheezing-worse with activity. No fevers however slight warmness at times. Waking more frequently with cough and choking that sounds like reflux events. Difficult for her to cough out phlegm. Denies fever or sweat. Admits shortness of breath particularly with exertion. Sleeps with head up on pillows. CT chest 06/26/12- reviewed with  her IMPRESSION: 1. No evidence for acute pulmonary embolus. 2. Ground-glass attenuation and peripheral nodular consolidation within the right lower lobe consistent with pneumonitis. Findings likely sequela of aspiration and/or pneumonia. 3. Cavitary lesion versus cystic bronchiectasis within the perihilar left lower lobe. That this is unchanged in size from previous exam but now contains a central soft tissue attenuating filling defect which may represent a mycetoma. 4. Diffuse, bilateral bronchial wall thickening which is favored to represent the sequela of chronic aspiration and bronchitis. Original Report Authenticated By: Kerby Moors, M.D. CXR 02/18/14 IMPRESSION: Nodular opacities and persistent collapse of right middle lung. Suggest CT to exclude neoplasm. Diffuse emphysematous changes in the lungs. Electronically Signed  By: Lucienne Capers M.D.  On: 02/18/2014 04:51 Office spirometry 07/08/2014 Pre and postbronchodilator: Very severe obstructive airways disease with severe restriction of FVC and insignificant response to bronchodilator. FVC 0.90/41%, FEV1 0.44/27%, FEV1/FVC 0.49  09/06/14- 73 yoF never smoker with hx of asthma/ bronchiectasis, retained secretions, left hilar mass/cyst PET neg in 2008/ then progressive, RML atelectasis. PET neg 2012   husband here FOLLOWS FOR: was seen at [facility] ED 5/2 last week for bronchospasms.  pt today c/o prod cough with lots of yellow mucus. ER gave nebulizer treatment, prednisone and Z-Pak.  Got ? Chest cold. CXR 08/29/14- My review> note atelctatic areas have cleared  IMPRESSION: Hyperinflated lungs with chronic bronchitic markings. No acute findings. Electronically Signed  By: Suzy Bouchard M.D.  On: 08/29/2014 22:00  10/12/14- 73 yoF never smoker with hx of asthma/ bronchiectasis, retained secretions, left hilar mass/cyst PET neg in 2008/ then progressive,  RML atelectasis. PET neg 2012   husband here ACUTE VISIT: Pt  called stating she has used her nebulizer about 10 times since last night without relief; comes and goes. Pt having increased SOB and wheezing,cough-small amounts of phelgm-beige in color. Pt was "hot" but never checked for fever. She had called [place] June 7 reporting 3 days of wheeze and malaise. We sent doxycycline and 3 days of prednisone. She has been using her nebulizer regularly and stopped Symbicort. As soon as she ran out of prednisone  wheezing got worse. No fever, chills, chest pain or purulent sputum and no sudden choking events. Using home oxygen at 2 L.  01/06/15-  73 yoF never smoker with hx of asthma/ bronchiectasis, retained secretions, left hilar mass/cyst PET neg in 2008/ then progressive, RML atelectasis. PET neg 2012    ACUTE: pt c/o wheezing , coughing, heavy legs since monday.  increase in SOB.  pt c/o of productive cough clear in color.  We gave script for avelox to use if needed  CXR 10/12/14 There is peribronchial thickening particularly in the right middle lobe. No consolidative infiltrates or atelectasis or effusions. Heart size and vascularity are normal. No osseous abnormality. IMPRESSION: Bronchitic changes, most prominent in the right middle lobe. Electronically Signed  By: Lorriane Shire M.D.  On: 10/12/2014 13:55  01/26/15- 1 yoF never smoker with hx of asthma/ bronchiectasis, retained secretions, left hilar mass/cyst PET neg in 2008/ then progressive, RML atelectasis. PET neg 2012  Acute visit:  tougue swelling started after taking Doxycyline RX. Pt has non productive couhg-gags instead. No fevers as well. We gave Avelox to hold at last ov which she says she did not take. Called Rx doxy 9/26, but she called today saying swollen tongue, malaise, cough, sob after 2 doses of doxycycline. She has complained of swollen tongue with multiple medications. Complains of "no energy" may be chills but no fever. Feels she has to keep her oxygen continuously. Nonproductive  cough. Using nebulizer every 4 hours because she feels she needs it but relief is temporary.  ROS-see HPI Constitutional:   No-   weight loss, night sweats, fevers, + chills, fatigue, lassitude. HEENT:   No-  headaches, difficulty swallowing, tooth/dental problems, sore throat,       No-  sneezing, itching, ear ache, nasal congestion, post nasal drip,  CV:  No-   chest pain, +orthopnea, no-PND, swelling in lower extremities, anasarca,  dizziness, palpitations Resp: +   shortness of breath with exertion or at rest.             + productive cough,  + non-productive cough,  No- coughing up of blood.              +change in color of mucus. +wheezing.   Skin: No-   rash or lesions. GI:  No-   heartburn, indigestion, abdominal pain, nausea, vomiting,  GU: . MS:  No-   joint pain or swelling.   Neuro-     nothing unusual Psych:  No- change in mood or affect. No depression or anxiety.  No memory loss.  OBJ- Physical Exam General- Alert, Oriented, Affect-appropriate, Distress- none acute. Thin Skin- rash-none, lesions- none, excoriation- none   Lymphadenopathy- none Head- atraumatic            Eyes- Gross vision intact, PERRLA, conjunctivae and secretions clear            Ears- +somewhat hard of hearing  Nose- Clear, no-Septal dev, mucus, polyps, erosion, perforation             Throat- Mallampati II , mucosa clear , drainage- none, tonsils- atrophic, tongue is not swollen Neck- flexible , trachea midline, no stridor , thyroid nl, carotid no bruit Chest - symmetrical excursion , unlabored           Heart/CV- RRR , no murmur , no gallop  , no rub, nl s1 s2                           - JVD- none , edema- none, stasis changes- none, varices- none           Lung- + wheeze bilateral , +l mildly abored, cough -none, dullness-none, rub- none           Chest wall-  Abd- Br/ Gen/ Rectal- Not done, not indicated Extrem- cyanosis- none, clubbing, none, atrophy- none, strength- wheelchair   Neuro- grossly intact to observation

## 2015-01-26 NOTE — Assessment & Plan Note (Signed)
Acute exacerbation may reflect an incidental viral tracheobronchitis superimposed on her chronic bronchiectasis; limited antibiotic tolerance. Plan-liquid quinolone antibiotic, fluids and symptomatic therapy is needed.

## 2015-01-26 NOTE — Assessment & Plan Note (Signed)
She is using her nebulizer up to every 4 hours. Not sure she is treating bronchospasm as much as nonspecific malaise. Educated on use of medications.

## 2015-01-26 NOTE — Patient Instructions (Addendum)
Depo 80  Neb Brovana here   Then 2-3 Brovana nebule samples if available. Try 1 as a neb med every 8 hours if needed.  Order- lab- CBC w diff, Sed rate, angioedema panel, C-reactive protein, Allergy profile, Food IgE profile  Order- CXR dx bronchiectasis with exacerbation  Stop doxycycline  Antihistamines like claritin, allegra, zyrtec can be used to block the allergic reaction/ tongue swelling

## 2015-01-26 NOTE — Telephone Encounter (Signed)
Spoke with pt. States that she has been on Doxycycline since Monday. She is not feeling any better. Reports tongue swelling, coughing and SOB. Denies chest tightness, fever. Advised pt not to take anymore of the medication due to the tongue swelling until she hears back from [place].  Allergies  Allergen Reactions  . Penicillins Swelling    Throat swelling  . Shellfish Allergy Anaphylaxis  . Sulfonamide Derivatives Swelling    Throat swelling  . Aspirin Other (See Comments)    States stomach bubbles, becomes gaseous and irritated  . Azithromycin     "makes me gag"  . Bee Venom   . Clindamycin     REACTION: Neck, tongue swelling, SOB \\T \ rash  . Fluticasone-Salmeterol     REACTION: hoarseness  . Fruit & Vegetable Daily [Nutritional Supplements]     Tongue swelling, vomiting  . Latex Itching and Swelling  . Montelukast Sodium     REACTION: hallucination  . Ventolin [Albuterol]     cough   Current Outpatient Prescriptions on File Prior to Visit  Medication Sig Dispense Refill  . acetaminophen (TYLENOL) 325 MG tablet Take 325 mg by mouth every 6 (six) hours as needed. For pain    . albuterol (PROAIR HFA) 108 (90 BASE) MCG/ACT inhaler Inhale 2 puffs into the lungs every 4 (four) hours as needed for wheezing or shortness of breath. For shortness of breath 1 Inhaler 0  . albuterol (PROVENTIL) (2.5 MG/3ML) 0.083% nebulizer solution Take 3 mLs (2.5 mg total) by nebulization every 4 (four) hours as needed for wheezing or shortness of breath. 75 mL 5  . Ascorbic Acid (VITAMIN C PO) Take 1 tablet by mouth daily.     . B Complex Vitamins (VITAMIN B COMPLEX PO) Take 1 tablet by mouth daily as needed (takes when she remembers to take it).     . budesonide-formoterol (SYMBICORT) 160-4.5 MCG/ACT inhaler Inhale 1 puff into the lungs 2 (two) times daily.    . cholecalciferol (VITAMIN D) 1000 UNITS tablet Take 1,000 Units by mouth daily.    Marland Kitchen diltiazem (CARDIZEM) 120 MG tablet Take 1 tablet (120 mg  total) by mouth 2 (two) times daily. 60 tablet 11  . diphenhydrAMINE (BENADRYL) 25 MG tablet Take 25 mg by mouth every 6 (six) hours as needed for allergies.    Marland Kitchen doxycycline (VIBRA-TABS) 100 MG tablet Take 1 tablet (100 mg total) by mouth 2 (two) times daily. Take 2 tablets today then 1 tablet daily till gone. 14 tablet 0  . loratadine (CLARITIN) 10 MG tablet Take 10 mg by mouth daily as needed for allergies.     Marland Kitchen moxifloxacin (AVELOX) 400 MG tablet 1 daily in applesauce 5 tablet 0  . Multiple Vitamin (MULTIVITAMIN) tablet Take 1 tablet by mouth daily.      Marland Kitchen Respiratory Therapy Supplies (FLUTTER) DEVI Blow through 4 times per set and repeat 3 sets per day, to loosen lung secretions. 1 each 0   No current facility-administered medications on file prior to visit.    CY - please advise. Thanks.

## 2015-01-26 NOTE — Telephone Encounter (Signed)
Per CY-lets have patient come in today at 11:15am slot with CY. If patient is unable to come in please let us know asap. Thanks.

## 2015-01-26 NOTE — Assessment & Plan Note (Signed)
She did not tolerate doxycycline by her description. We need updated information, chest x-ray, CBC with differential. Wheeze is the most obvious feature on exam and this may be an asthmatic bronchitis. Continue to question intermittent reflux. Plan-Depo-Medrol, nebulizer treatment here with Brovana and then a few samples to try at home to see if longer acting medication helps her. Labs for chest x-ray CBC with differential. Based on chest x-ray we will decide about antibiotic.

## 2015-01-27 ENCOUNTER — Encounter (HOSPITAL_COMMUNITY): Payer: Self-pay | Admitting: *Deleted

## 2015-01-27 ENCOUNTER — Encounter (HOSPITAL_COMMUNITY): Payer: Commercial Managed Care - HMO

## 2015-01-27 ENCOUNTER — Inpatient Hospital Stay (HOSPITAL_COMMUNITY)
Admission: EM | Admit: 2015-01-27 | Discharge: 2015-01-29 | DRG: 194 | Disposition: A | Discharge status: Home / Self Care | Payer: Commercial Managed Care - HMO | Attending: Internal Medicine | Admitting: Internal Medicine

## 2015-01-27 ENCOUNTER — Emergency Department (HOSPITAL_COMMUNITY): Payer: Commercial Managed Care - HMO

## 2015-01-27 ENCOUNTER — Telehealth: Payer: Self-pay | Admitting: Internal Medicine

## 2015-01-27 DIAGNOSIS — Z79899 Other long term (current) drug therapy: Secondary | ICD-10-CM

## 2015-01-27 DIAGNOSIS — Z880 Allergy status to penicillin: Secondary | ICD-10-CM | POA: Diagnosis not present

## 2015-01-27 DIAGNOSIS — J470 Bronchiectasis with acute lower respiratory infection: Secondary | ICD-10-CM | POA: Diagnosis present

## 2015-01-27 DIAGNOSIS — J9610 Chronic respiratory failure, unspecified whether with hypoxia or hypercapnia: Secondary | ICD-10-CM | POA: Diagnosis present

## 2015-01-27 DIAGNOSIS — Z9981 Dependence on supplemental oxygen: Secondary | ICD-10-CM

## 2015-01-27 DIAGNOSIS — K449 Diaphragmatic hernia without obstruction or gangrene: Secondary | ICD-10-CM | POA: Diagnosis present

## 2015-01-27 DIAGNOSIS — J189 Pneumonia, unspecified organism: Principal | ICD-10-CM | POA: Diagnosis present

## 2015-01-27 DIAGNOSIS — Z7951 Long term (current) use of inhaled steroids: Secondary | ICD-10-CM | POA: Diagnosis not present

## 2015-01-27 DIAGNOSIS — I10 Essential (primary) hypertension: Secondary | ICD-10-CM | POA: Diagnosis present

## 2015-01-27 DIAGNOSIS — Z8249 Family history of ischemic heart disease and other diseases of the circulatory system: Secondary | ICD-10-CM | POA: Diagnosis not present

## 2015-01-27 DIAGNOSIS — J45901 Unspecified asthma with (acute) exacerbation: Secondary | ICD-10-CM | POA: Diagnosis present

## 2015-01-27 DIAGNOSIS — Z886 Allergy status to analgesic agent status: Secondary | ICD-10-CM

## 2015-01-27 DIAGNOSIS — Z833 Family history of diabetes mellitus: Secondary | ICD-10-CM

## 2015-01-27 DIAGNOSIS — Z825 Family history of asthma and other chronic lower respiratory diseases: Secondary | ICD-10-CM | POA: Diagnosis not present

## 2015-01-27 DIAGNOSIS — J479 Bronchiectasis, uncomplicated: Secondary | ICD-10-CM

## 2015-01-27 DIAGNOSIS — Z91040 Latex allergy status: Secondary | ICD-10-CM

## 2015-01-27 DIAGNOSIS — J42 Unspecified chronic bronchitis: Secondary | ICD-10-CM | POA: Diagnosis present

## 2015-01-27 DIAGNOSIS — Z820 Family history of epilepsy and other diseases of the nervous system: Secondary | ICD-10-CM | POA: Diagnosis not present

## 2015-01-27 DIAGNOSIS — Z888 Allergy status to other drugs, medicaments and biological substances status: Secondary | ICD-10-CM

## 2015-01-27 DIAGNOSIS — J471 Bronchiectasis with (acute) exacerbation: Secondary | ICD-10-CM | POA: Diagnosis not present

## 2015-01-27 HISTORY — DX: Reserved for inherently not codable concepts without codable children: IMO0001

## 2015-01-27 HISTORY — DX: Pneumonia, unspecified organism: J18.9

## 2015-01-27 LAB — ALLERGEN FOOD PROFILE SPECIFIC IGE
Apple: <0.1 kU/L
Chicken IgE: <0.1 kU/L
IGE (IMMUNOGLOBULIN E), SERUM: 95 kU/L (ref <115)
Milk IgE: <0.1 kU/L
Orange: <0.1 kU/L
Peanut IgE: <0.1 kU/L
Soybean IgE: <0.1 kU/L
Tuna IgE: <0.1 kU/L

## 2015-01-27 LAB — COMPREHENSIVE METABOLIC PANEL
ALT: 15 U/L (ref 14–54)
AST: 21 U/L (ref 15–41)
Albumin: 3.4 g/dL — ABNORMAL LOW (ref 3.5–5.0)
Alkaline Phosphatase: 57 U/L (ref 38–126)
Anion gap: 7 (ref 5–15)
BUN: 7 mg/dL (ref 6–20)
CHLORIDE: 97 mmol/L — AB (ref 101–111)
CO2: 34 mmol/L — AB (ref 22–32)
CREATININE: 0.82 mg/dL (ref 0.44–1.00)
Calcium: 9.4 mg/dL (ref 8.9–10.3)
GFR calc non Af Amer: >60 mL/min (ref >60)
Glucose, Bld: 106 mg/dL — ABNORMAL HIGH (ref 65–99)
POTASSIUM: 3.9 mmol/L (ref 3.5–5.1)
SODIUM: 138 mmol/L (ref 135–145)
Total Bilirubin: 0.8 mg/dL (ref 0.3–1.2)
Total Protein: 7.9 g/dL (ref 6.5–8.1)

## 2015-01-27 LAB — ALLERGY FULL PROFILE
Allergen,Goose feathers, e70: <0.1 kU/L
Alternaria Alternata: <0.1 kU/L
Aspergillus fumigatus, m3: 0.21 kU/L — ABNORMAL HIGH
Bahia Grass: <0.1 kU/L
Bermuda Grass: <0.1 kU/L
Box Elder IgE: <0.1 kU/L
Elm IgE: <0.1 kU/L
G005 Rye, Perennial: <0.1 kU/L
Helminthosporium halodes: <0.1 kU/L
House Dust Hollister: <0.1 kU/L
Lamb's Quarters: <0.1 kU/L
Plantain: <0.1 kU/L
Sycamore Tree: <0.1 kU/L
Timothy Grass: <0.1 kU/L

## 2015-01-27 LAB — CBC
HCT: 43.1 % (ref 36.0–46.0)
Hemoglobin: 13.9 g/dL (ref 12.0–15.0)
MCH: 29.9 pg (ref 26.0–34.0)
MCHC: 32.3 g/dL (ref 30.0–36.0)
MCV: 92.7 fL (ref 78.0–100.0)
PLATELETS: 414 10*3/uL — AB (ref 150–400)
RBC: 4.65 MIL/uL (ref 3.87–5.11)
RDW: 13.1 % (ref 11.5–15.5)
WBC: 8.8 10*3/uL (ref 4.0–10.5)

## 2015-01-27 LAB — I-STAT TROPONIN, ED: TROPONIN I, POC: 0 ng/mL (ref 0.00–0.08)

## 2015-01-27 MED ORDER — IPRATROPIUM-ALBUTEROL 0.5-2.5 (3) MG/3ML IN SOLN
3.0000 mL | Freq: Once | RESPIRATORY_TRACT | Status: AC
  Administered 2015-01-27: 3 mL via RESPIRATORY_TRACT
  Filled 2015-01-27: qty 3

## 2015-01-27 MED ORDER — LEVOFLOXACIN IN D5W 750 MG/150ML IV SOLN
750.0000 mg | INTRAVENOUS | Status: DC
  Administered 2015-01-27: 750 mg via INTRAVENOUS
  Filled 2015-01-27: qty 150

## 2015-01-27 MED ORDER — METHYLPREDNISOLONE SODIUM SUCC 125 MG IJ SOLR
125.0000 mg | Freq: Once | INTRAMUSCULAR | Status: AC
  Administered 2015-01-27: 125 mg via INTRAVENOUS
  Filled 2015-01-27: qty 2

## 2015-01-27 NOTE — Telephone Encounter (Signed)
[place] and was told that they could grind pt's medication and mix to liquid form Pt would have to bring pills to pharmacy and pay fee for grinding. Liquid form could be ready by tomorrow morning  Called pt and informed her of what [place] said Pt stated that she feels that she can not wait until tomorrow to get medication in her Pt stated that she does have a grinder and could crush her own medicine but she feels that she is getting worse and her family members have rec taking her to ER for evaluation Informed pt that CY agreed to this and she needs to go to ER if she feels that bad Pt said her family was taking her now  Nothing further is needed at this time

## 2015-01-27 NOTE — Telephone Encounter (Signed)
Called and spoke with pharmacist from [facility] stated that there is no avelox liquid form and the pharmacy can not grind medication for pt Dr Annamaria Boots, do you have any more rec? Thanks

## 2015-01-27 NOTE — Telephone Encounter (Signed)
Per CXR results: Notes Recorded by Deneise Lever, MD on 01/26/2015 at 4:16 PM CXR shows pneumonia right lower lung, although white blood count is not increased as would be expected. We had given script for liquid avelox on 9/8, which I don't think she took. If she still has this, have her take it. Otherwise send new script for same ---  Called spoke with pt regarding results. She reports she was not given liquid avelox RX but was given RX for avelox 400 mg tabs. She did not take bc the tablets were big and was not able to crush them up. Please advise Dr. Annamaria Boots thanks  Allergies  Allergen Reactions  . Penicillins Swelling    Throat swelling  . Shellfish Allergy Anaphylaxis  . Sulfonamide Derivatives Swelling    Throat swelling  . Aspirin Other (See Comments)    States stomach bubbles, becomes gaseous and irritated  . Azithromycin     "makes me gag"  . Bee Venom   . Clindamycin     REACTION: Neck, tongue swelling, SOB \\T \ rash  . Fluticasone-Salmeterol     REACTION: hoarseness  . Fruit & Vegetable Daily [Nutritional Supplements]     Tongue swelling, vomiting  . Latex Itching and Swelling  . Montelukast Sodium     REACTION: hallucination  . Ventolin [Albuterol]     cough     Current Outpatient Prescriptions on File Prior to Visit  Medication Sig Dispense Refill  . acetaminophen (TYLENOL) 325 MG tablet Take 325 mg by mouth every 6 (six) hours as needed. For pain    . albuterol (PROAIR HFA) 108 (90 BASE) MCG/ACT inhaler Inhale 2 puffs into the lungs every 4 (four) hours as needed for wheezing or shortness of breath. For shortness of breath 1 Inhaler 0  . albuterol (PROVENTIL) (2.5 MG/3ML) 0.083% nebulizer solution Take 3 mLs (2.5 mg total) by nebulization every 4 (four) hours as needed for wheezing or shortness of breath. 75 mL 5  . Ascorbic Acid (VITAMIN C PO) Take 1 tablet by mouth daily.     . B Complex Vitamins (VITAMIN B COMPLEX PO) Take 1 tablet by mouth daily as needed (takes  when she remembers to take it).     . budesonide-formoterol (SYMBICORT) 160-4.5 MCG/ACT inhaler Inhale 1 puff into the lungs 2 (two) times daily.    . cholecalciferol (VITAMIN D) 1000 UNITS tablet Take 1,000 Units by mouth daily.    Marland Kitchen diltiazem (CARDIZEM) 120 MG tablet Take 1 tablet (120 mg total) by mouth 2 (two) times daily. 60 tablet 11  . diphenhydrAMINE (BENADRYL) 25 MG tablet Take 25 mg by mouth every 6 (six) hours as needed for allergies.    Marland Kitchen doxycycline (VIBRA-TABS) 100 MG tablet Take 1 tablet (100 mg total) by mouth 2 (two) times daily. Take 2 tablets today then 1 tablet daily till gone. (Patient not taking: Reported on 01/26/2015) 14 tablet 0  . loratadine (CLARITIN) 10 MG tablet Take 10 mg by mouth daily as needed for allergies.     . Multiple Vitamin (MULTIVITAMIN) tablet Take 1 tablet by mouth daily.      Marland Kitchen Respiratory Therapy Supplies (FLUTTER) DEVI Blow through 4 times per set and repeat 3 sets per day, to loosen lung secretions. 1 each 0   No current facility-administered medications on file prior to visit.

## 2015-01-27 NOTE — ED Notes (Addendum)
Pt in c/o increased shortness of breath, uses home O2 at night normally but has needed it constantly the last few days, worse with exertion, history of asthma, no distress noted, pt reports cough, denies fever but reports chills

## 2015-01-27 NOTE — ED Notes (Signed)
Pt was 85% on RA wikth audible wheezing when she arrived to room. Pt placed on 2L [place]. O2 improved to 97%. Wheezing still audible. MD aware, and at bedside.

## 2015-01-27 NOTE — Progress Notes (Signed)
ANTIBIOTIC CONSULT NOTE - INITIAL  Pharmacy Consult for levofloxacin Indication: CAP  Allergies  Allergen Reactions  . Penicillins Swelling    Throat swelling  . Shellfish Allergy Anaphylaxis  . Sulfonamide Derivatives Swelling    Throat swelling  . Aspirin Other (See Comments)    States stomach bubbles, becomes gaseous and irritated  . Azithromycin     "makes me gag"  . Bee Venom   . Clindamycin     REACTION: Neck, tongue swelling, SOB \\T \ rash  . Fluticasone-Salmeterol     REACTION: hoarseness  . Fruit & Vegetable Daily [Nutritional Supplements]     Tongue swelling, vomiting  . Latex Itching and Swelling  . Montelukast Sodium     REACTION: hallucination  . Ventolin [Albuterol]     cough    Patient Measurements: Weight: 125 lb (56.7 kg)  Vital Signs: Temp: 98.9 F (37.2 C) (09/29 1943) Temp Source: Oral (09/29 1943) BP: 173/80 mmHg (09/29 2050) Pulse Rate: 102 (09/29 2050) Intake/Output from previous day:   Intake/Output from this shift:    Labs:  Recent Labs  01/26/15 1230 01/27/15 1947  WBC 8.1 8.8  HGB 14.4 13.9  PLT 435.0* 414*  CREATININE  --  0.82   Estimated Creatinine Clearance: 48.3 mL/min (by C-G formula based on Cr of 0.82). No results for input(s): VANCOTROUGH, VANCOPEAK, VANCORANDOM, GENTTROUGH, GENTPEAK, GENTRANDOM, TOBRATROUGH, TOBRAPEAK, TOBRARND, AMIKACINPEAK, AMIKACINTROU, AMIKACIN in the last 72 hours.   Microbiology: No results found for this or any previous visit (from the past 720 hour(s)).  Medical History: Past Medical History  Diagnosis Date  . Anxiety   . Asthma   . HTN (hypertension)   . Allergic rhinitis     Assessment: 51 yof presenting with SOB, taking antibiotics x 2 weeks pta per note. CXR shows RLL pna. Pharmacy consulted to dose levofloxacin for CAP. Afebrile, wbc wnl. SCr 0.82 on admit, CrCl~48.  No cultures. Chart lists multiple abx allergies  9/29 levo>>  Goal of Therapy:  Eradicaiton of  infection  Plan:  Levo 750mg  IV q48h Mon clinical progress, renal function, abx plan/LOT  Elicia Lamp, PharmD Clinical Pharmacist Pager [phone] 01/27/2015 9:09 PM

## 2015-01-27 NOTE — Telephone Encounter (Signed)
Could you check with [facility] and see if they could grind Avelox or provide it in liquid form that [name] could swallow?

## 2015-01-27 NOTE — Progress Notes (Signed)
Quick Note:  LVM for pt to return call ______ 

## 2015-01-27 NOTE — Telephone Encounter (Signed)
Per Cy-we can have patient get Rx from pharmacy as it was sent on 01-06-15 and request they grind up the pills individually for her or check with pharmacy about Avelox liquid (pediatric)keeping dose at 400mg  daily. Thanks.

## 2015-01-27 NOTE — H&P (Signed)
[facility] History and Physical  [name] LOV:[id number] DOB: [birth year] DOA: 01/27/2015  PCP: Walker Kehr, MD  Specialists: Dr. Baird Lyons, pulmonary  Chief Complaint: Progressive shortness of breath, increased O2 requirement, failed outpatient management  HPI: [name] is a 74 y.o. woman with a history of asthma, bronchiectasis, left hilar mass, and HTN who feels that she has not been well since Labor Day.  Essentially she has had cough productive of yellow sputum for several weeks.  This has been associated with fatigue and progressive shortness of breath.  No documented fever or sweats, but she has had light-headedness and chills.  She feels that her shortness of breath became even worse after encountering a strong perfume on Sunday.  Since then, she has had to use her home oxygen continuously (previously PRN), and she has required nebulizer treatments every four hours.  She was seen by her pulmonologist earlier this week and diagnosed with a new right lower lobe pneumonia.  She had to abort a doxycycline course after developing tongue swelling.  She was then prescribed Avelox, which she has not tolerated well.    She has received IV levaquin in the ED and feels improved.  She is being admitted for further evaluation and treatment.    Review of Systems: 12 systems reviewed and negative except as stated in HPI.  Past Medical History  Diagnosis Date  . Anxiety   . Asthma   . HTN (hypertension)   . Allergic rhinitis    Past Surgical History  Procedure Laterality Date  . Tubal ligation     Social History:  Social History   Social History Narrative   Regular exercise - YES  No tobacco, EtOH, or illicit drug use.  Lives with her husband.  Allergies  Allergen Reactions  . Penicillins Swelling    Throat swelling  . Shellfish Allergy Anaphylaxis  . Sulfonamide Derivatives Swelling    Throat swelling  . Aspirin Other (See Comments)    States stomach bubbles,  becomes gaseous and irritated  . Azithromycin     "makes me gag"  . Bee Venom   . Clindamycin     REACTION: Neck, tongue swelling, SOB \\T \ rash  . Fluticasone-Salmeterol     REACTION: hoarseness  . Fruit & Vegetable Daily [Nutritional Supplements]     Tongue swelling, vomiting  . Latex Itching and Swelling  . Montelukast Sodium     REACTION: hallucination  . Ventolin [Albuterol]     cough    Family History  Problem Relation Age of Onset  . Diabetes Father   . Asthma Father   . Allergies Father   . Mental illness Mother     alzheimer's  . Allergies Brother   . Allergies Brother   . Heart disease Maternal Aunt    Prior to Admission medications   Medication Sig Start Date End Date Taking? Authorizing Provider  acetaminophen (TYLENOL) 325 MG tablet Take 325 mg by mouth every 6 (six) hours as needed. For pain   Yes Historical Provider, MD  albuterol (PROAIR HFA) 108 (90 BASE) MCG/ACT inhaler Inhale 2 puffs into the lungs every 4 (four) hours as needed for wheezing or shortness of breath. For shortness of breath 08/30/14  Yes Jola Schmidt, MD  albuterol (PROVENTIL) (2.5 MG/3ML) 0.083% nebulizer solution Take 3 mLs (2.5 mg total) by nebulization every 4 (four) hours as needed for wheezing or shortness of breath. 10/05/14  Yes Deneise Lever, MD  Ascorbic Acid (VITAMIN C PO)  Take 1 tablet by mouth daily.    Yes Historical Provider, MD  B Complex Vitamins (VITAMIN B COMPLEX PO) Take 1 tablet by mouth daily as needed (takes when she remembers to take it).    Yes Historical Provider, MD  budesonide-formoterol (SYMBICORT) 160-4.5 MCG/ACT inhaler Inhale 1 puff into the lungs 2 (two) times daily. 06/07/11  Yes Aleksei Plotnikov V, MD  cholecalciferol (VITAMIN D) 1000 UNITS tablet Take 1,000 Units by mouth daily.   Yes Historical Provider, MD  diltiazem (CARDIZEM) 120 MG tablet Take 1 tablet (120 mg total) by mouth 2 (two) times daily. 11/16/14  Yes Aleksei Plotnikov V, MD  diphenhydrAMINE  (BENADRYL) 25 MG tablet Take 25 mg by mouth every 6 (six) hours as needed for allergies.   Yes Historical Provider, MD  loratadine (CLARITIN) 10 MG tablet Take 10 mg by mouth daily as needed for allergies.  08/16/10  Yes Aleksei Plotnikov V, MD  Multiple Vitamin (MULTIVITAMIN) tablet Take 1 tablet by mouth daily.     Yes Historical Provider, MD  Respiratory Therapy Supplies (FLUTTER) DEVI Blow through 4 times per set and repeat 3 sets per day, to loosen lung secretions. Patient not taking: Reported on 01/27/2015 07/16/14   Deneise Lever, MD   Physical Exam: Filed Vitals:   01/27/15 2100 01/27/15 2115 01/27/15 2130 01/27/15 2145  BP: 149/76 145/66 152/75 153/77  Pulse: 91 87 109 103  Temp:      TempSrc:      Resp: 28 20 30 24   Weight:      SpO2: 97% 99% 98% 99%     General:  Awake and alert, NAD  Eyes: PERRL bilaterally  ENT: No drainage  Neck: Supple  Cardiovascular: NR/RR, no significant pitting edema  Respiratory: Coarse bilaterally with expiratory wheezes  Abdomen: soft, nontender, nondistended  Skin: Warm and dry  Musculoskeletal: Moves all four extremities spontaneously  Psychiatric: Normal mood  Neurologic: No focal deficits  Labs on Admission:  Basic Metabolic Panel:  Recent Labs Lab 01/27/15 1947  NA 138  K 3.9  CL 97*  CO2 34*  GLUCOSE 106*  BUN 7  CREATININE 0.82  CALCIUM 9.4   Liver Function Tests:  Recent Labs Lab 01/27/15 1947  AST 21  ALT 15  ALKPHOS 57  BILITOT 0.8  PROT 7.9  ALBUMIN 3.4*   CBC:  Recent Labs Lab 01/26/15 1230 01/27/15 1947  WBC 8.1 8.8  NEUTROABS 4.2  --   HGB 14.4 13.9  HCT 43.5 43.1  MCV 90.4 92.7  PLT 435.0* 414*    Radiological Exams on Admission: Dg Chest 2 View  01/27/2015   CLINICAL DATA:  Shortness of breath since yesterday.  Cough.  EXAM: CHEST  2 VIEW  COMPARISON:  Frontal and lateral views yesterday. Chest CT 07/13/2014  FINDINGS: The lungs remain hyperinflated. Right middle lobe atelectasis  with possible collapse, unchanged from prior exam. The patchy airspace disease in the right lower lobe is unchanged to minimally improved from exam performed yesterday. Minimal streaky opacity in the retrocardiac left lower lobe, likely atelectasis. Cardiomediastinal contours are unchanged. There is no pleural effusion, pulmonary edema, or pneumothorax. No acute osseous abnormalities.  IMPRESSION: 1. Unchanged to minimally improved right lower lobe consolidation, concerning for pneumonia. 2. Atelectasis or collapse of the right middle lobe. This appears recurrent or chronic. 3. Linear opacities in the retrocardiac left lower lobe, likely atelectasis. 4. Continued radiographic follow-up recommended to resolution, as recommended previously. Bronchoscopy may be helpful if findings fail to  resolve to evaluate for endobronchial lesion.   Electronically Signed   By: Jeb Levering M.D.   On: 01/27/2015 20:10   Dg Chest 2 View  01/26/2015   CLINICAL DATA:  Shortness of breath, cough, congestion, asthma, hypertension, bronchiectasis with acute exacerbation  EXAM: CHEST  2 VIEW  COMPARISON:  10/12/2014  FINDINGS: Normal heart size, mediastinal contours, and pulmonary vascularity.  Atherosclerotic calcification aorta.  Lungs hyperinflated with chronic RIGHT middle lobe atelectasis.  Increased opacity at RIGHT base likely representing coexistent RIGHT lower lobe pneumonia.  LEFT lung clear.  No pleural effusion or pneumothorax.  Bones unremarkable.  IMPRESSION: Emphysematous changes with chronic RIGHT middle lobe atelectasis ; unable to exclude central endobronchial obstruction.  New RIGHT lower lobe infiltrate consistent with pneumonia.  Follow-up exams until resolution recommended to exclude underlying abnormalities including tumor; if RIGHT middle lobe atelectasis fails to resolve, bronchoscopy would be warranted to exclude an obstructing endobronchial lesion.   Electronically Signed   By: Lavonia Dana M.D.   On:  01/26/2015 13:07    EKG: Independently reviewed. NSR, no acute ST segment changes.  Assessment/Plan Active Problems:   PNA (pneumonia)   Community acquired pneumonia   1. Admit to med surg, inpatient  2.  CAP, failed outpatient management --IV levaquin to be dosed by pharmacy --Blood cultures, if not drawn in the ED --Urine legionella and streptococcal antigens --Mucinex 600mg  q12h --Continue Symbicort inhaler --Wean oxygen as tolerated --Respiratory therapy/pulmonary toilet with incentive spirometer and acapella flutter valve, dubnebs q4h --Will need to contact her pulmonologist in the AM   Code Status: FULL  Family Communication: Husband at bedside Disposition Plan: Expect her to be here at least two midnights  Time spent: 60 minutes  [employer] [facility]  01/27/2015, 11:30 PM

## 2015-01-27 NOTE — ED Provider Notes (Signed)
CSN: [id number]     Arrival date & time 01/27/15  1937 History   First MD Initiated Contact with Patient 01/27/15 2044     Chief Complaint  Patient presents with  . Shortness of Breath     (Consider location/radiation/quality/duration/timing/severity/associated sxs/prior Treatment) HPI   Patient is a 74 year old with history of asthma bronchiectasis left hilar mass with right middle lobe atelectasis -2002 presenting today with new right lobe pneumonia. She has been treated outpatient by Dr. Annamaria Boots, with [facility] pulmonology. She's been given 2 different antibiotics in the last couple of weeks. She complained of allergic reaction to doxycycline, and then has been having trouble getting Avelox in a  formulation that she can tolerate. Meanwhile patient's been using her inhalers more frequently than every 4 hours due to the wheezing and shortness of breath. Patient's on home oxygen 2L  Past Medical History  Diagnosis Date  . Anxiety   . Asthma   . HTN (hypertension)   . Allergic rhinitis    Past Surgical History  Procedure Laterality Date  . Tubal ligation     Family History  Problem Relation Age of Onset  . Diabetes Father   . Asthma Father   . Allergies Father   . Mental illness Mother     alzheimer's  . Allergies Brother   . Allergies Brother   . Heart disease Maternal Aunt    Social History  Substance Use Topics  . Smoking status: Never Smoker   . Smokeless tobacco: Never Used     Comment: father smoked, worked w/smokers  . Alcohol Use: No   OB History    No data available     Review of Systems  Constitutional: Negative for activity change and fatigue.  HENT: Negative for congestion and drooling.   Eyes: Negative for discharge.  Respiratory: Positive for cough, chest tightness and shortness of breath.   Cardiovascular: Negative for chest pain.  Gastrointestinal: Negative for abdominal distention.  Genitourinary: Negative for dysuria and difficulty urinating.   Musculoskeletal: Negative for joint swelling.  Skin: Negative for rash.  Allergic/Immunologic: Negative for immunocompromised state.  Neurological: Negative for seizures and speech difficulty.  Psychiatric/Behavioral: Negative for behavioral problems and agitation.      Allergies  Penicillins; Shellfish allergy; Sulfonamide derivatives; Aspirin; Azithromycin; Bee venom; Clindamycin; Fluticasone-salmeterol; Fruit & vegetable daily; Latex; Montelukast sodium; and Ventolin  Home Medications   Prior to Admission medications   Medication Sig Start Date End Date Taking? Authorizing Provider  acetaminophen (TYLENOL) 325 MG tablet Take 325 mg by mouth every 6 (six) hours as needed. For pain    Historical Provider, MD  albuterol (PROAIR HFA) 108 (90 BASE) MCG/ACT inhaler Inhale 2 puffs into the lungs every 4 (four) hours as needed for wheezing or shortness of breath. For shortness of breath 08/30/14   Jola Schmidt, MD  albuterol (PROVENTIL) (2.5 MG/3ML) 0.083% nebulizer solution Take 3 mLs (2.5 mg total) by nebulization every 4 (four) hours as needed for wheezing or shortness of breath. 10/05/14   Deneise Lever, MD  Ascorbic Acid (VITAMIN C PO) Take 1 tablet by mouth daily.     Historical Provider, MD  B Complex Vitamins (VITAMIN B COMPLEX PO) Take 1 tablet by mouth daily as needed (takes when she remembers to take it).     Historical Provider, MD  budesonide-formoterol (SYMBICORT) 160-4.5 MCG/ACT inhaler Inhale 1 puff into the lungs 2 (two) times daily. 06/07/11   Cassandria Anger, MD  cholecalciferol (VITAMIN D) 1000 UNITS tablet  Take 1,000 Units by mouth daily.    Historical Provider, MD  diltiazem (CARDIZEM) 120 MG tablet Take 1 tablet (120 mg total) by mouth 2 (two) times daily. 11/16/14   Aleksei Plotnikov V, MD  diphenhydrAMINE (BENADRYL) 25 MG tablet Take 25 mg by mouth every 6 (six) hours as needed for allergies.    Historical Provider, MD  doxycycline (VIBRA-TABS) 100 MG tablet Take 1  tablet (100 mg total) by mouth 2 (two) times daily. Take 2 tablets today then 1 tablet daily till gone. Patient not taking: Reported on 01/26/2015 01/24/15   Deneise Lever, MD  loratadine (CLARITIN) 10 MG tablet Take 10 mg by mouth daily as needed for allergies.  08/16/10   Aleksei Plotnikov V, MD  Multiple Vitamin (MULTIVITAMIN) tablet Take 1 tablet by mouth daily.      Historical Provider, MD  Respiratory Therapy Supplies (FLUTTER) DEVI Blow through 4 times per set and repeat 3 sets per day, to loosen lung secretions. 07/16/14   Deneise Lever, MD   BP 173/80 mmHg  Pulse 102  Temp(Src) 98.9 F (37.2 C) (Oral)  Resp 20  Wt 125 lb (56.7 kg)  SpO2 86% Physical Exam  Constitutional: She is oriented to person, place, and time. She appears well-developed and well-nourished.  HENT:  Head: Normocephalic and atraumatic.  Eyes: Conjunctivae are normal. Right eye exhibits no discharge.  Neck: Neck supple.  Cardiovascular: Normal rate, regular rhythm and normal heart sounds.   No murmur heard. Pulmonary/Chest: She has wheezes. She has no rales.  Patient tachypnic to 19.  Abdominal: Soft. She exhibits no distension. There is no tenderness.  Musculoskeletal: Normal range of motion. She exhibits no edema.  Neurological: She is oriented to person, place, and time. No cranial nerve deficit.  Skin: Skin is warm and dry. No rash noted. She is not diaphoretic.  Psychiatric: She has a normal mood and affect.  Nursing note and vitals reviewed.   ED Course  Procedures (including critical care time) Labs Review Labs Reviewed  CBC - Abnormal; Notable for the following:    Platelets 414 (*)    All other components within normal limits  COMPREHENSIVE METABOLIC PANEL - Abnormal; Notable for the following:    Chloride 97 (*)    CO2 34 (*)    Glucose, Bld 106 (*)    Albumin 3.4 (*)    All other components within normal limits  Randolm Idol, ED    Imaging Review Dg Chest 2 View  01/27/2015    CLINICAL DATA:  Shortness of breath since yesterday.  Cough.  EXAM: CHEST  2 VIEW  COMPARISON:  Frontal and lateral views yesterday. Chest CT 07/13/2014  FINDINGS: The lungs remain hyperinflated. Right middle lobe atelectasis with possible collapse, unchanged from prior exam. The patchy airspace disease in the right lower lobe is unchanged to minimally improved from exam performed yesterday. Minimal streaky opacity in the retrocardiac left lower lobe, likely atelectasis. Cardiomediastinal contours are unchanged. There is no pleural effusion, pulmonary edema, or pneumothorax. No acute osseous abnormalities.  IMPRESSION: 1. Unchanged to minimally improved right lower lobe consolidation, concerning for pneumonia. 2. Atelectasis or collapse of the right middle lobe. This appears recurrent or chronic. 3. Linear opacities in the retrocardiac left lower lobe, likely atelectasis. 4. Continued radiographic follow-up recommended to resolution, as recommended previously. Bronchoscopy may be helpful if findings fail to resolve to evaluate for endobronchial lesion.   Electronically Signed   By: Jeb Levering M.D.   On: 01/27/2015  20:10   Dg Chest 2 View  01/26/2015   CLINICAL DATA:  Shortness of breath, cough, congestion, asthma, hypertension, bronchiectasis with acute exacerbation  EXAM: CHEST  2 VIEW  COMPARISON:  10/12/2014  FINDINGS: Normal heart size, mediastinal contours, and pulmonary vascularity.  Atherosclerotic calcification aorta.  Lungs hyperinflated with chronic RIGHT middle lobe atelectasis.  Increased opacity at RIGHT base likely representing coexistent RIGHT lower lobe pneumonia.  LEFT lung clear.  No pleural effusion or pneumothorax.  Bones unremarkable.  IMPRESSION: Emphysematous changes with chronic RIGHT middle lobe atelectasis ; unable to exclude central endobronchial obstruction.  New RIGHT lower lobe infiltrate consistent with pneumonia.  Follow-up exams until resolution recommended to exclude  underlying abnormalities including tumor; if RIGHT middle lobe atelectasis fails to resolve, bronchoscopy would be warranted to exclude an obstructing endobronchial lesion.   Electronically Signed   By: Lavonia Dana M.D.   On: 01/26/2015 13:07   I have personally reviewed and evaluated these images and lab results as part of my medical decision-making.   EKG Interpretation None      MDM   Final diagnoses:  None   patient is a 74 year old female presenting with shortness of breath. Patient has extensive history of right lung atelectasis and new right pneumonia. Dr. Annamaria Boots has tried to treat this as outpatient .  However patiently has failed outpatient management of this pneumonia.  We will need to admit patient for IV antibiotics given her inhaler use, tachypnea, and respiratory needs.  We'll treat her with levofloxacin, d duo nebs. We'll give Solu-Medrol.    Courteney Julio Alm, MD 01/27/15 2109

## 2015-01-28 ENCOUNTER — Encounter (HOSPITAL_COMMUNITY): Payer: Self-pay | Admitting: *Deleted

## 2015-01-28 ENCOUNTER — Inpatient Hospital Stay (HOSPITAL_COMMUNITY): Payer: Commercial Managed Care - HMO

## 2015-01-28 DIAGNOSIS — J189 Pneumonia, unspecified organism: Principal | ICD-10-CM

## 2015-01-28 DIAGNOSIS — I10 Essential (primary) hypertension: Secondary | ICD-10-CM

## 2015-01-28 DIAGNOSIS — J45901 Unspecified asthma with (acute) exacerbation: Secondary | ICD-10-CM

## 2015-01-28 DIAGNOSIS — J471 Bronchiectasis with (acute) exacerbation: Secondary | ICD-10-CM

## 2015-01-28 LAB — CBC
HEMATOCRIT: 41.1 % (ref 36.0–46.0)
HEMOGLOBIN: 13.3 g/dL (ref 12.0–15.0)
MCH: 29.9 pg (ref 26.0–34.0)
MCHC: 32.4 g/dL (ref 30.0–36.0)
MCV: 92.4 fL (ref 78.0–100.0)
Platelets: 395 10*3/uL (ref 150–400)
RBC: 4.45 MIL/uL (ref 3.87–5.11)
RDW: 13 % (ref 11.5–15.5)
WBC: 6 10*3/uL (ref 4.0–10.5)

## 2015-01-28 LAB — CREATININE, SERUM: Creatinine, Ser: 0.83 mg/dL (ref 0.44–1.00)

## 2015-01-28 LAB — STREP PNEUMONIAE URINARY ANTIGEN: STREP PNEUMO URINARY ANTIGEN: NEGATIVE

## 2015-01-28 MED ORDER — IPRATROPIUM-ALBUTEROL 0.5-2.5 (3) MG/3ML IN SOLN
3.0000 mL | Freq: Two times a day (BID) | RESPIRATORY_TRACT | Status: DC
  Administered 2015-01-28 – 2015-01-29 (×2): 3 mL via RESPIRATORY_TRACT
  Filled 2015-01-28 (×2): qty 3

## 2015-01-28 MED ORDER — BUDESONIDE-FORMOTEROL FUMARATE 160-4.5 MCG/ACT IN AERO
1.0000 | INHALATION_SPRAY | Freq: Two times a day (BID) | RESPIRATORY_TRACT | Status: DC
  Filled 2015-01-28: qty 6

## 2015-01-28 MED ORDER — VITAMIN D 1000 UNITS PO TABS
1000.0000 [IU] | ORAL_TABLET | Freq: Every day | ORAL | Status: DC
  Administered 2015-01-28 – 2015-01-29 (×2): 1000 [IU] via ORAL
  Filled 2015-01-28 (×2): qty 1

## 2015-01-28 MED ORDER — ENOXAPARIN SODIUM 40 MG/0.4ML ~~LOC~~ SOLN
40.0000 mg | Freq: Every day | SUBCUTANEOUS | Status: DC
  Administered 2015-01-28 – 2015-01-29 (×2): 40 mg via SUBCUTANEOUS
  Filled 2015-01-28 (×2): qty 0.4

## 2015-01-28 MED ORDER — ADULT MULTIVITAMIN W/MINERALS CH
1.0000 | ORAL_TABLET | Freq: Every day | ORAL | Status: DC
  Administered 2015-01-28 – 2015-01-29 (×2): 1 via ORAL
  Filled 2015-01-28 (×2): qty 1

## 2015-01-28 MED ORDER — VITAMIN C 500 MG PO TABS
250.0000 mg | ORAL_TABLET | Freq: Every day | ORAL | Status: DC
  Administered 2015-01-28 – 2015-01-29 (×2): 250 mg via ORAL
  Filled 2015-01-28 (×2): qty 1

## 2015-01-28 MED ORDER — GUAIFENESIN ER 600 MG PO TB12
600.0000 mg | ORAL_TABLET | Freq: Two times a day (BID) | ORAL | Status: DC
  Administered 2015-01-28 – 2015-01-29 (×4): 600 mg via ORAL
  Filled 2015-01-28 (×4): qty 1

## 2015-01-28 MED ORDER — ACETAMINOPHEN 325 MG PO TABS: 650.0000 mg | ORAL_TABLET | Freq: Four times a day (QID) | ORAL | Status: DC | PRN

## 2015-01-28 MED ORDER — METHYLPREDNISOLONE SODIUM SUCC 125 MG IJ SOLR
60.0000 mg | Freq: Four times a day (QID) | INTRAMUSCULAR | Status: DC
  Administered 2015-01-28 – 2015-01-29 (×4): 60 mg via INTRAVENOUS
  Filled 2015-01-28 (×4): qty 2

## 2015-01-28 MED ORDER — IPRATROPIUM-ALBUTEROL 0.5-2.5 (3) MG/3ML IN SOLN
3.0000 mL | RESPIRATORY_TRACT | Status: DC
  Administered 2015-01-28 (×2): 3 mL via RESPIRATORY_TRACT
  Filled 2015-01-28 (×4): qty 3

## 2015-01-28 MED ORDER — DILTIAZEM HCL 60 MG PO TABS
120.0000 mg | ORAL_TABLET | Freq: Two times a day (BID) | ORAL | Status: DC
  Administered 2015-01-28 – 2015-01-29 (×3): 120 mg via ORAL
  Filled 2015-01-28 (×4): qty 2

## 2015-01-28 MED ORDER — IOHEXOL 300 MG/ML  SOLN
75.0000 mL | Freq: Once | INTRAMUSCULAR | Status: AC | PRN
  Administered 2015-01-28: 75 mL via INTRAVENOUS

## 2015-01-28 NOTE — Consult Note (Signed)
Name: [name] MRN: [id number] DOB: 1940/12/02    ADMISSION DATE:  01/27/2015 CONSULTATION DATE:  9/30  REFERRING MD :  RAI  CHIEF COMPLAINT:  BTX and PNA   BRIEF PATIENT DESCRIPTION:  74 year old female pt of Dr Annamaria Boots w/ h/o chronic respiratory failure, asthma and BTX. Presents to ED on 9/29 w/ dx of CAP and failure to respond to out-pt therapy. PCCM asked to eval.   SIGNIFICANT EVENTS    STUDIES:  CT chest 9/29: Bronchiectasis with areas of inspissated intrabronchial secretions and secondary right middle lobe collapse. Areas of linear hypodensity within the collapsed right middle lobe may indicate fluid-filled bronchi although aspiration could appear similar. This is not significantly changed in appearance since prior similar exam. Right lower lobe consolidation suggesting aspiration pneumonia although other alveolar filling processes such as blood, pus, or protein could appear similar. This is new since the prior similar exam 07/13/2014.   HISTORY OF PRESENT ILLNESS:   74 y.o. woman with a history of asthma, bronchiectasis, left hilar mass, and HTN  Presents to ED on 9/29 w/ several week h/o not feeling well. Essentially she has had cough productive of yellow sputum for several weeks. This has been associated with fatigue and progressive shortness of breath. No documented fever or sweats, but she has had light-headedness and chills. She feels that her shortness of breath became even worse after encountering a strong perfume on 9/29. Since then, she has had to use her home oxygen continuously (previously PRN), and she has required nebulizer treatments every four hours. She was seen by Dr Annamaria Boots earlier this week and diagnosed with a new right lower lobe pneumonia. She was initially prescribed avelox but could not take due to the pill size. She had to abort a doxycycline course after developing tongue swelling. She was then instructed to try the Avelox, which she tried to crush  but still could not take. Because of this and still not improving she presented to the ED.   PAST MEDICAL HISTORY :   has a past medical history of Anxiety; Asthma; HTN (hypertension); Allergic rhinitis; Shortness of breath dyspnea; and Pneumonia.  has past surgical history that includes Tubal ligation. Prior to Admission medications   Medication Sig Start Date End Date Taking? Authorizing Provider  acetaminophen (TYLENOL) 325 MG tablet Take 325 mg by mouth every 6 (six) hours as needed. For pain   Yes Historical Provider, MD  albuterol (PROAIR HFA) 108 (90 BASE) MCG/ACT inhaler Inhale 2 puffs into the lungs every 4 (four) hours as needed for wheezing or shortness of breath. For shortness of breath 08/30/14  Yes Jola Schmidt, MD  albuterol (PROVENTIL) (2.5 MG/3ML) 0.083% nebulizer solution Take 3 mLs (2.5 mg total) by nebulization every 4 (four) hours as needed for wheezing or shortness of breath. 10/05/14  Yes Deneise Lever, MD  Ascorbic Acid (VITAMIN C PO) Take 1 tablet by mouth daily.    Yes Historical Provider, MD  B Complex Vitamins (VITAMIN B COMPLEX PO) Take 1 tablet by mouth daily as needed (takes when she remembers to take it).    Yes Historical Provider, MD  budesonide-formoterol (SYMBICORT) 160-4.5 MCG/ACT inhaler Inhale 1 puff into the lungs 2 (two) times daily. 06/07/11  Yes Aleksei Plotnikov V, MD  cholecalciferol (VITAMIN D) 1000 UNITS tablet Take 1,000 Units by mouth daily.   Yes Historical Provider, MD  diltiazem (CARDIZEM) 120 MG tablet Take 1 tablet (120 mg total) by mouth 2 (two) times daily. 11/16/14  Yes Aleksei Plotnikov V, MD  diphenhydrAMINE (BENADRYL) 25 MG tablet Take 25 mg by mouth every 6 (six) hours as needed for allergies.   Yes Historical Provider, MD  loratadine (CLARITIN) 10 MG tablet Take 10 mg by mouth daily as needed for allergies.  08/16/10  Yes Aleksei Plotnikov V, MD  Multiple Vitamin (MULTIVITAMIN) tablet Take 1 tablet by mouth daily.     Yes Historical Provider,  MD  Respiratory Therapy Supplies (FLUTTER) DEVI Blow through 4 times per set and repeat 3 sets per day, to loosen lung secretions. Patient not taking: Reported on 01/27/2015 07/16/14   Deneise Lever, MD   Allergies  Allergen Reactions  . Penicillins Swelling    Throat swelling  . Shellfish Allergy Anaphylaxis  . Sulfonamide Derivatives Swelling    Throat swelling  . Aspirin Other (See Comments)    States stomach bubbles, becomes gaseous and irritated  . Azithromycin     "makes me gag"  . Bee Venom   . Clindamycin     REACTION: Neck, tongue swelling, SOB \\T \ rash  . Fluticasone-Salmeterol     REACTION: hoarseness  . Fruit & Vegetable Daily [Nutritional Supplements]     Tongue swelling, vomiting  . Latex Itching and Swelling  . Montelukast Sodium     REACTION: hallucination  . Ventolin [Albuterol]     cough    FAMILY HISTORY:  family history includes Allergies in her brother, brother, and father; Asthma in her father; Diabetes in her father; Heart disease in her maternal aunt; Mental illness in her mother. SOCIAL HISTORY:  reports that she has never smoked. She has never used smokeless tobacco. She reports that she does not drink alcohol or use illicit drugs.  REVIEW OF SYSTEMS:   Constitutional: Negative for fever, chills +, weight loss, malaise/fatigue and diaphoresis.  HENT: Negative for hearing loss, ear pain, nosebleeds, congestion, sore throat, neck pain, tinnitus and ear discharge.   Eyes: Negative for blurred vision, double vision, photophobia, pain, discharge and redness.  Respiratory:+ cough, at times w/ beige sputum, hemoptysis,shortness of breath, could not walk 10 ft,  wheezing and stridor.   Cardiovascular: Negative for chest pain, palpitations, orthopnea, claudication, leg swelling and PND.  Gastrointestinal: Negative for heartburn, nausea, vomiting, abdominal pain, diarrhea, constipation, blood in stool and melena.  Genitourinary: Negative for dysuria, urgency,  frequency, hematuria and flank pain.  Musculoskeletal:+ myalgias, back pain, joint pain and falls.  Skin: Negative for itching and rash.  Neurological: Negative for dizziness, tingling, tremors, sensory change, speech change, focal weakness, seizures, loss of consciousness, weakness and headaches.  Endo/Heme/Allergies: Negative for environmental allergies and polydipsia. Does not bruise/bleed easily.  SUBJECTIVE:  Feels better already  VITAL SIGNS: Temp:  [97.5 F (36.4 C)-98.9 F (37.2 C)] 98 F (36.7 C) (09/30 0911) Pulse Rate:  [77-109] 77 (09/30 0911) Resp:  [16-30] 16 (09/30 0911) BP: (90-173)/(51-85) 112/51 mmHg (09/30 0911) SpO2:  [86 %-100 %] 98 % (09/30 0911) Weight:  [54.613 kg (120 lb 6.4 oz)-56.7 kg (125 lb)] 54.613 kg (120 lb 6.4 oz) (09/29 2349)  PHYSICAL EXAMINATION: General:  74 year old female, currently in no acute distress.  Neuro:  Awake, alert, no focal def  HEENT:  [place], AT , no JVD  Cardiovascular:  rrr Lungs:  Crackles right base, exp wheeze t/o Abdomen:  Soft non-tender + bowel sounds  Musculoskeletal:  Intact  Skin:  Dry and intact    Recent Labs Lab 01/27/15 1947 01/28/15 0029  NA 138  --  K 3.9  --   CL 97*  --   CO2 34*  --   BUN 7  --   CREATININE 0.82 0.83  GLUCOSE 106*  --     Recent Labs Lab 01/26/15 1230 01/27/15 1947 01/28/15 0029  HGB 14.4 13.9 13.3  HCT 43.5 43.1 41.1  WBC 8.1 8.8 6.0  PLT 435.0* 414* 395   Dg Chest 2 View  01/27/2015   CLINICAL DATA:  Shortness of breath since yesterday.  Cough.  EXAM: CHEST  2 VIEW  COMPARISON:  Frontal and lateral views yesterday. Chest CT 07/13/2014  FINDINGS: The lungs remain hyperinflated. Right middle lobe atelectasis with possible collapse, unchanged from prior exam. The patchy airspace disease in the right lower lobe is unchanged to minimally improved from exam performed yesterday. Minimal streaky opacity in the retrocardiac left lower lobe, likely atelectasis. Cardiomediastinal  contours are unchanged. There is no pleural effusion, pulmonary edema, or pneumothorax. No acute osseous abnormalities.  IMPRESSION: 1. Unchanged to minimally improved right lower lobe consolidation, concerning for pneumonia. 2. Atelectasis or collapse of the right middle lobe. This appears recurrent or chronic. 3. Linear opacities in the retrocardiac left lower lobe, likely atelectasis. 4. Continued radiographic follow-up recommended to resolution, as recommended previously. Bronchoscopy may be helpful if findings fail to resolve to evaluate for endobronchial lesion.   Electronically Signed   By: Jeb Levering M.D.   On: 01/27/2015 20:10   Dg Chest 2 View  01/26/2015   CLINICAL DATA:  Shortness of breath, cough, congestion, asthma, hypertension, bronchiectasis with acute exacerbation  EXAM: CHEST  2 VIEW  COMPARISON:  10/12/2014  FINDINGS: Normal heart size, mediastinal contours, and pulmonary vascularity.  Atherosclerotic calcification aorta.  Lungs hyperinflated with chronic RIGHT middle lobe atelectasis.  Increased opacity at RIGHT base likely representing coexistent RIGHT lower lobe pneumonia.  LEFT lung clear.  No pleural effusion or pneumothorax.  Bones unremarkable.  IMPRESSION: Emphysematous changes with chronic RIGHT middle lobe atelectasis ; unable to exclude central endobronchial obstruction.  New RIGHT lower lobe infiltrate consistent with pneumonia.  Follow-up exams until resolution recommended to exclude underlying abnormalities including tumor; if RIGHT middle lobe atelectasis fails to resolve, bronchoscopy would be warranted to exclude an obstructing endobronchial lesion.   Electronically Signed   By: Lavonia Dana M.D.   On: 01/26/2015 13:07   Ct Chest W Contrast  01/28/2015   CLINICAL DATA:  Shortness of breath and cough, Right lower lobe consolidation, bronchiectasis  EXAM: CT CHEST WITH CONTRAST  TECHNIQUE: Multidetector CT imaging of the chest was performed during intravenous contrast  administration.  CONTRAST:  104mL OMNIPAQUE IOHEXOL 300 MG/ML  SOLN  COMPARISON:  Chest radiograph 01/27/2015, chest CT 07/13/2014  FINDINGS: Mediastinum/Nodes: Heart size is normal. No pericardial effusion. No lymphadenopathy. Trace fluid within the superior pericardial recess. Thyroid is grossly normal.  Lungs/Pleura: Central bronchiectasis is identified with multiple areas of inspissated secretions. There is apparent occlusion of the right middle lobe bronchi, image 32, with branching low-density linear areas within the collapsed right middle lobe, for example image 33. Patchy bibasilar, right greater than left, ground-glass airspace opacity is identified. No pleural effusion.  Upper abdomen: Small hiatal hernia. Moderate atheromatous aortic calcification without aneurysm.  Musculoskeletal: No acute osseous abnormality.  IMPRESSION: Bronchiectasis with areas of inspissated intrabronchial secretions and secondary right middle lobe collapse.  Areas of linear hypodensity within the collapsed right middle lobe may indicate fluid-filled bronchi although aspiration could appear similar. This is not significantly changed in appearance since  prior similar exam.  Right lower lobe consolidation suggesting aspiration pneumonia although other alveolar filling processes such as blood, pus, or protein could appear similar. This is new since the prior similar exam 07/13/2014.  Bronchoscopy may be helpful for evaluation of a possible superimposed intrabronchial lesion due to chronicity of findings.   Electronically Signed   By: Conchita Paris M.D.   On: 01/28/2015 08:41    ASSESSMENT / PLAN:  CAP (NOS w/ failure to respond to out-pt treatment), complicated by underlying Bronchiectasis w/ mucous plugging and asthmatic exacerbation superimposed on chronic respiratory failure.  >Suspect that the bronchospasm is likely driving most of her symptom burden at this point, but feels remarkably better after < 12 hrs of treatment.  Suspect the RML collapse is d/t mucous retention but malignancy not out of the question  Plan Cont current abx Taper steroids Cont O2 Cont symbicort IS and Flutter F/u our office Oct 11th w/ Tammy Parrett (appointment in chart) Will likely need repeat CT imaging in 2-6 months.   Erick Colace ACNP-BC [place] Pager # [phone] OR # [phone] if no answer   01/28/2015, 9:16 AM

## 2015-01-28 NOTE — Progress Notes (Signed)
[facility]                                                                              Patient Demographics  [name], is a 74 y.o. female, DOB - 1940/07/11, KZL:[id number]  Admit date - 01/27/2015   Admitting Physician Lily Kocher, MD  Outpatient Primary MD for the patient is Walker Kehr, MD  LOS - 1   Chief Complaint  Patient presents with  . Shortness of Breath       Brief HPI   [name] is a 74 y.o. woman with a history of asthma, bronchiectasis, left hilar mass, and HTN who feels that she has not been well since Labor Day. Essentially she has had cough productive of yellow sputum for several weeks. This has been associated with fatigue and progressive shortness of breath. No documented fever or sweats, but she  had light-headedness and chills.Patient reported that her shortness of breath became even worse after encountering a strong perfume on Sunday. Since then, she has had to use her home oxygen continuously (previously PRN), and she has required nebulizer treatments every four hours. She was seen by her pulmonologist earlier this week and diagnosed with a new right lower lobe pneumonia. She had to abort a doxycycline course after developing tongue swelling. She was then prescribed Avelox, which she has not tolerated well.  She received IV levaquin in the ED and feels improved. She was admitted for further evaluation and treatment.    Assessment & Plan    Principal Problem:   Community acquired pneumonia complicated with underlying bronchiectasis with mucous plugging, asthma exacerbation, chronic respiratory failure, failed outpatient treatment - Continue IV Levaquin, IV steroids, will taper or transition to oral prednisone in am. Continue scheduled broncho-dilators, Symbicort - Flutter valve, O2 via nasal cannula. Home O2 evaluation at the time of discharge - Highly appreciate pulmonology recommendations - Follow up scheduled  outpatient pulmonology on 10/11    Active Problems: History of right middle lobe collapse on the recent CT - will repeat CT imaging for further workup    Essential hypertension Currently stable, continue Cardizem  Code Status: Full code  Family Communication: Discussed in detail with the patient, all imaging results, lab results explained to the patient    Disposition Plan: Hopefully DC in 24-48 hours if stable   Time Spent in minutes  25 minutes  Procedures  CT chest  Consults   Pulmonology  DVT Prophylaxis  Lovenox   Medications  Scheduled Meds: . budesonide-formoterol  1 puff Inhalation BID  . cholecalciferol  1,000 Units Oral Daily  . diltiazem  120 mg Oral BID  . enoxaparin (LOVENOX) injection  40 mg Subcutaneous Daily  . guaiFENesin  600 mg Oral BID  . ipratropium-albuterol  3 mL Nebulization Q4H  . levofloxacin (LEVAQUIN) IV  750 mg Intravenous Q48H  . methylPREDNISolone (SOLU-MEDROL) injection  60 mg Intravenous Q6H  . multivitamin with minerals  1 tablet Oral Daily  . vitamin C  250 mg Oral Daily   Continuous Infusions:  PRN Meds:.acetaminophen   Antibiotics   Anti-infectives    Start     Dose/Rate  Route Frequency Ordered Stop   01/27/15 2115  levofloxacin (LEVAQUIN) IVPB 750 mg     750 mg 100 mL/hr over 90 Minutes Intravenous Every 48 hours 01/27/15 2109          Subjective:   [name] was seen and examined today.   feels better today, no fevers or chills. Shortness of breath improving Patient denies dizziness, chest pain, abdominal pain, N/V/D/C, new weakness, numbess, tingling. No acute events overnight.    Objective:   Blood pressure 112/51, pulse 77, temperature 98 F (36.7 C), temperature source Oral, resp. rate 16, height 5\' 3"  (1.6 m), weight 54.613 kg (120 lb 6.4 oz), SpO2 98 %.  Wt Readings from Last 3 Encounters:  01/27/15 54.613 kg (120 lb 6.4 oz)  01/26/15 55.974 kg (123 lb 6.4 oz)  01/06/15 57.335 kg (126 lb 6.4 oz)      Intake/Output Summary (Last 24 hours) at 01/28/15 1119 Last data filed at 01/28/15 1111  Gross per 24 hour  Intake    960 ml  Output   1300 ml  Net   -340 ml    Exam  General: Alert and oriented x 3, NAD  HEENT:  PERRLA, EOMI, Anicteric Sclera, mucous membranes moist.   Neck: Supple, no JVD, no masses  CVS: S1 S2 auscultated, no rubs, murmurs or gallops. Regular rate and rhythm.  Respiratory: Right base crackles   Abdomen: Soft, nontender, nondistended, + bowel sounds  Ext: no cyanosis clubbing or edema  Neuro: AAOx3, Cr N's II- XII. Strength 5/5 upper and lower extremities bilaterally  Skin: No rashes  Psych: Normal affect and demeanor, alert and oriented x3    Data Review   Micro Results No results found for this or any previous visit (from the past 240 hour(s)).  Radiology Reports Dg Chest 2 View  01/27/2015   CLINICAL DATA:  Shortness of breath since yesterday.  Cough.  EXAM: CHEST  2 VIEW  COMPARISON:  Frontal and lateral views yesterday. Chest CT 07/13/2014  FINDINGS: The lungs remain hyperinflated. Right middle lobe atelectasis with possible collapse, unchanged from prior exam. The patchy airspace disease in the right lower lobe is unchanged to minimally improved from exam performed yesterday. Minimal streaky opacity in the retrocardiac left lower lobe, likely atelectasis. Cardiomediastinal contours are unchanged. There is no pleural effusion, pulmonary edema, or pneumothorax. No acute osseous abnormalities.  IMPRESSION: 1. Unchanged to minimally improved right lower lobe consolidation, concerning for pneumonia. 2. Atelectasis or collapse of the right middle lobe. This appears recurrent or chronic. 3. Linear opacities in the retrocardiac left lower lobe, likely atelectasis. 4. Continued radiographic follow-up recommended to resolution, as recommended previously. Bronchoscopy may be helpful if findings fail to resolve to evaluate for endobronchial lesion.    Electronically Signed   By: Jeb Levering M.D.   On: 01/27/2015 20:10   Dg Chest 2 View  01/26/2015   CLINICAL DATA:  Shortness of breath, cough, congestion, asthma, hypertension, bronchiectasis with acute exacerbation  EXAM: CHEST  2 VIEW  COMPARISON:  10/12/2014  FINDINGS: Normal heart size, mediastinal contours, and pulmonary vascularity.  Atherosclerotic calcification aorta.  Lungs hyperinflated with chronic RIGHT middle lobe atelectasis.  Increased opacity at RIGHT base likely representing coexistent RIGHT lower lobe pneumonia.  LEFT lung clear.  No pleural effusion or pneumothorax.  Bones unremarkable.  IMPRESSION: Emphysematous changes with chronic RIGHT middle lobe atelectasis ; unable to exclude central endobronchial obstruction.  New RIGHT lower lobe infiltrate consistent with pneumonia.  Follow-up exams until  resolution recommended to exclude underlying abnormalities including tumor; if RIGHT middle lobe atelectasis fails to resolve, bronchoscopy would be warranted to exclude an obstructing endobronchial lesion.   Electronically Signed   By: Lavonia Dana M.D.   On: 01/26/2015 13:07   Ct Chest W Contrast  01/28/2015   CLINICAL DATA:  Shortness of breath and cough, Right lower lobe consolidation, bronchiectasis  EXAM: CT CHEST WITH CONTRAST  TECHNIQUE: Multidetector CT imaging of the chest was performed during intravenous contrast administration.  CONTRAST:  58mL OMNIPAQUE IOHEXOL 300 MG/ML  SOLN  COMPARISON:  Chest radiograph 01/27/2015, chest CT 07/13/2014  FINDINGS: Mediastinum/Nodes: Heart size is normal. No pericardial effusion. No lymphadenopathy. Trace fluid within the superior pericardial recess. Thyroid is grossly normal.  Lungs/Pleura: Central bronchiectasis is identified with multiple areas of inspissated secretions. There is apparent occlusion of the right middle lobe bronchi, image 32, with branching low-density linear areas within the collapsed right middle lobe, for example image 33.  Patchy bibasilar, right greater than left, ground-glass airspace opacity is identified. No pleural effusion.  Upper abdomen: Small hiatal hernia. Moderate atheromatous aortic calcification without aneurysm.  Musculoskeletal: No acute osseous abnormality.  IMPRESSION: Bronchiectasis with areas of inspissated intrabronchial secretions and secondary right middle lobe collapse.  Areas of linear hypodensity within the collapsed right middle lobe may indicate fluid-filled bronchi although aspiration could appear similar. This is not significantly changed in appearance since prior similar exam.  Right lower lobe consolidation suggesting aspiration pneumonia although other alveolar filling processes such as blood, pus, or protein could appear similar. This is new since the prior similar exam 07/13/2014.  Bronchoscopy may be helpful for evaluation of a possible superimposed intrabronchial lesion due to chronicity of findings.   Electronically Signed   By: Conchita Paris M.D.   On: 01/28/2015 08:41    CBC  Recent Labs Lab 01/26/15 1230 01/27/15 1947 01/28/15 0029  WBC 8.1 8.8 6.0  HGB 14.4 13.9 13.3  HCT 43.5 43.1 41.1  PLT 435.0* 414* 395  MCV 90.4 92.7 92.4  MCH  --  29.9 29.9  MCHC 33.2 32.3 32.4  RDW 13.5 13.1 13.0  LYMPHSABS 1.9  --   --   MONOABS 0.8  --   --   EOSABS 1.1*  --   --   BASOSABS 0.1  --   --     Chemistries   Recent Labs Lab 01/27/15 1947 01/28/15 0029  NA 138  --   K 3.9  --   CL 97*  --   CO2 34*  --   GLUCOSE 106*  --   BUN 7  --   CREATININE 0.82 0.83  CALCIUM 9.4  --   AST 21  --   ALT 15  --   ALKPHOS 57  --   BILITOT 0.8  --    ------------------------------------------------------------------------------------------------------------------ estimated creatinine clearance is 49.9 mL/min (by C-G formula based on Cr of 0.83). ------------------------------------------------------------------------------------------------------------------ No results for  input(s): HGBA1C in the last 72 hours. ------------------------------------------------------------------------------------------------------------------ No results for input(s): CHOL, HDL, LDLCALC, TRIG, CHOLHDL, LDLDIRECT in the last 72 hours. ------------------------------------------------------------------------------------------------------------------ No results for input(s): TSH, T4TOTAL, T3FREE, THYROIDAB in the last 72 hours.  Invalid input(s): FREET3 ------------------------------------------------------------------------------------------------------------------ No results for input(s): VITAMINB12, FOLATE, FERRITIN, TIBC, IRON, RETICCTPCT in the last 72 hours.  Coagulation profile No results for input(s): INR, PROTIME in the last 168 hours.  No results for input(s): DDIMER in the last 72 hours.  Cardiac Enzymes No results for input(s): CKMB, TROPONINI, MYOGLOBIN in  the last 168 hours.  Invalid input(s): CK ------------------------------------------------------------------------------------------------------------------ Invalid input(s): POCBNP  No results for input(s): GLUCAP in the last 72 hours.   RAI,RIPUDEEP M.D. [facility] 01/28/2015, 11:19 AM  Pager: [phone] Between 7am to 7pm - call Pager - [phone]  After 7pm go to [email or web address] - password [email or web address]  Call night coverage person covering after 7pm

## 2015-01-29 LAB — BASIC METABOLIC PANEL
Anion gap: 9 (ref 5–15)
BUN: 16 mg/dL (ref 6–20)
CALCIUM: 9.6 mg/dL (ref 8.9–10.3)
CO2: 32 mmol/L (ref 22–32)
Chloride: 98 mmol/L — ABNORMAL LOW (ref 101–111)
Creatinine, Ser: 0.86 mg/dL (ref 0.44–1.00)
GFR calc Af Amer: >60 mL/min (ref >60)
GLUCOSE: 144 mg/dL — AB (ref 65–99)
POTASSIUM: 4.3 mmol/L (ref 3.5–5.1)
Sodium: 139 mmol/L (ref 135–145)

## 2015-01-29 LAB — LEGIONELLA PNEUMOPHILA SEROGP 1 UR AG: L. pneumophila Serogp 1 Ur Ag: NEGATIVE

## 2015-01-29 LAB — CBC
HCT: 41 % (ref 36.0–46.0)
Hemoglobin: 13.1 g/dL (ref 12.0–15.0)
MCH: 29.9 pg (ref 26.0–34.0)
MCHC: 32 g/dL (ref 30.0–36.0)
MCV: 93.6 fL (ref 78.0–100.0)
PLATELETS: 421 10*3/uL — AB (ref 150–400)
RBC: 4.38 MIL/uL (ref 3.87–5.11)
RDW: 13.1 % (ref 11.5–15.5)
WBC: 12.3 10*3/uL — ABNORMAL HIGH (ref 4.0–10.5)

## 2015-01-29 MED ORDER — LEVOFLOXACIN 750 MG PO TABS
750.0000 mg | ORAL_TABLET | Freq: Once | ORAL | Status: AC
  Administered 2015-01-29: 750 mg via ORAL
  Filled 2015-01-29: qty 1

## 2015-01-29 MED ORDER — PREDNISONE 10 MG PO TABS: ORAL_TABLET | ORAL | Status: DC

## 2015-01-29 MED ORDER — GUAIFENESIN ER 600 MG PO TB12: 600.0000 mg | ORAL_TABLET | Freq: Two times a day (BID) | ORAL | Status: DC

## 2015-01-29 MED ORDER — OMEPRAZOLE 20 MG PO CPDR: 20.0000 mg | DELAYED_RELEASE_CAPSULE | Freq: Every day | ORAL | Status: DC

## 2015-01-29 MED ORDER — SACCHAROMYCES BOULARDII 250 MG PO CAPS
250.0000 mg | ORAL_CAPSULE | Freq: Two times a day (BID) | ORAL | Status: DC
  Administered 2015-01-29: 250 mg via ORAL
  Filled 2015-01-29: qty 1

## 2015-01-29 MED ORDER — PREDNISONE 20 MG PO TABS
40.0000 mg | ORAL_TABLET | Freq: Every day | ORAL | Status: DC
  Administered 2015-01-29: 40 mg via ORAL
  Filled 2015-01-29: qty 2

## 2015-01-29 MED ORDER — BENZONATATE 100 MG PO CAPS
100.0000 mg | ORAL_CAPSULE | Freq: Three times a day (TID) | ORAL | Status: DC
  Administered 2015-01-29: 100 mg via ORAL
  Filled 2015-01-29: qty 1

## 2015-01-29 MED ORDER — LEVOFLOXACIN 25 MG/ML PO SOLN: 500.0000 mg | Freq: Every day | ORAL | Status: DC

## 2015-01-29 MED ORDER — SACCHAROMYCES BOULARDII 250 MG PO CAPS: 250.0000 mg | ORAL_CAPSULE | Freq: Two times a day (BID) | ORAL | Status: DC

## 2015-01-29 MED ORDER — BENZONATATE 100 MG PO CAPS: 100.0000 mg | ORAL_CAPSULE | Freq: Three times a day (TID) | ORAL | Status: DC | PRN

## 2015-01-29 NOTE — Progress Notes (Signed)
SATURATION QUALIFICATIONS:   Patient Saturations on 2 Liters of oxygen at Rest = 99%  Patient Saturations on Room Air while Ambulating = 96% Patient Saturations on 2 Liters of oxygen while Ambulating = not tested

## 2015-01-29 NOTE — Discharge Summary (Signed)
Physician Discharge Summary   Patient ID: [name] MRN: [id number] DOB/AGE: 74/22/1942 74 y.o.  Admit date: 01/27/2015 Discharge date: 01/29/2015  Primary Care Physician:  Walker Kehr, MD  Discharge Diagnoses:    . Community acquired pneumonia . Essential hypertension   Bronchiectasis with acute on chronic bronchitis  Consults:  Pulmonology, Dr. Lake Bells   Recommendations for Outpatient Follow-up:  Patient was placed on liquid Levaquin for 8 days (liquid/solution per patient's request) Patient was treatment her strongly to use her nebulizers, inhalers however she is not very keen to use the inhalers as they make her cough  Patient was placed on prednisone taper.   TESTS THAT NEED FOLLOW-UP none   DIET: Heart healthy diet    Allergies:   Allergies  Allergen Reactions  . Penicillins Swelling    Throat swelling  . Shellfish Allergy Anaphylaxis  . Sulfonamide Derivatives Swelling    Throat swelling  . Aspirin Other (See Comments)    States stomach bubbles, becomes gaseous and irritated  . Azithromycin     "makes me gag"  . Bee Venom   . Clindamycin     REACTION: Neck, tongue swelling, SOB \\T \ rash  . Fluticasone-Salmeterol     REACTION: hoarseness  . Fruit & Vegetable Daily [Nutritional Supplements]     Tongue swelling, vomiting  . Latex Itching and Swelling  . Montelukast Sodium     REACTION: hallucination  . Ventolin [Albuterol]     cough     Discharge Medications:   Medication List    TAKE these medications        acetaminophen 325 MG tablet  Commonly known as:  TYLENOL  Take 325 mg by mouth every 6 (six) hours as needed. For pain     albuterol 108 (90 BASE) MCG/ACT inhaler  Commonly known as:  PROAIR HFA  Inhale 2 puffs into the lungs every 4 (four) hours as needed for wheezing or shortness of breath. For shortness of breath     albuterol (2.5 MG/3ML) 0.083% nebulizer solution  Commonly known as:  PROVENTIL  Take 3 mLs (2.5 mg total)  by nebulization every 4 (four) hours as needed for wheezing or shortness of breath.     benzonatate 100 MG capsule  Commonly known as:  TESSALON  Take 1 capsule (100 mg total) by mouth 3 (three) times daily as needed for cough.     budesonide-formoterol 160-4.5 MCG/ACT inhaler  Commonly known as:  SYMBICORT  Inhale 1 puff into the lungs 2 (two) times daily.     cholecalciferol 1000 UNITS tablet  Commonly known as:  VITAMIN D  Take 1,000 Units by mouth daily.     diltiazem 120 MG tablet  Commonly known as:  CARDIZEM  Take 1 tablet (120 mg total) by mouth 2 (two) times daily.     diphenhydrAMINE 25 MG tablet  Commonly known as:  BENADRYL  Take 25 mg by mouth every 6 (six) hours as needed for allergies.     FLUTTER Devi  Blow through 4 times per set and repeat 3 sets per day, to loosen lung secretions.     guaiFENesin 600 MG 12 hr tablet  Commonly known as:  MUCINEX  Take 1 tablet (600 mg total) by mouth 2 (two) times daily.     levofloxacin 25 MG/ML solution  Commonly known as:  LEVAQUIN  Take 20 mLs (500 mg total) by mouth daily. For 8 days  Start taking on:  01/30/2015     loratadine 10  MG tablet  Commonly known as:  CLARITIN  Take 10 mg by mouth daily as needed for allergies.     multivitamin tablet  Take 1 tablet by mouth daily.     omeprazole 20 MG capsule  Commonly known as:  PRILOSEC  Take 1 capsule (20 mg total) by mouth daily. While on prednisone     predniSONE 10 MG tablet  Commonly known as:  DELTASONE  Prednisone dosing: Take  Prednisone 40mg  (4 tabs) x 2 days, then taper to 30mg  (3 tabs) x 3 days, then 20mg  (2 tabs) x 3days, then 10mg  (1 tab) x 3days, then OFF.  Start taking on:  01/30/2015     saccharomyces boulardii 250 MG capsule  Commonly known as:  FLORASTOR  Take 1 capsule (250 mg total) by mouth 2 (two) times daily.     VITAMIN B COMPLEX PO  Take 1 tablet by mouth daily as needed (takes when she remembers to take it).     VITAMIN C PO  Take 1  tablet by mouth daily.         Brief H and P: For complete details please refer to admission H and P, but in brief [name] is a 74 y.o. woman with a history of asthma, bronchiectasis, left hilar mass, and HTN who feels that she has not been well since Labor Day. Essentially she has had cough productive of yellow sputum for several weeks. This has been associated with fatigue and progressive shortness of breath. No documented fever or sweats, but she had light-headedness and chills.Patient reported that her shortness of breath became even worse after encountering a strong perfume on Sunday. Since then, she has had to use her home oxygen continuously (previously PRN), and she has required nebulizer treatments every four hours. She was seen by her pulmonologist earlier this week and diagnosed with a new right lower lobe pneumonia. She had to abort a doxycycline course after developing tongue swelling. She was then prescribed Avelox, which she has not tolerated well.  She received IV levaquin in the ED and feels improved. She was admitted for further evaluation and treatment  Hospital Course:   Community acquired pneumonia complicated with underlying bronchiectasis flare with mucous plugging, asthma exacerbation, chronic respiratory failure, failed outpatient treatment -Patient was placed on IV Levaquin and IV steroids. She was placed on Symbicort and do her labs however patient was not very keen on using inhalers as they make her cough. Pulmonology consult was also obtained.  Pulmonology, Dr. Lake Bells also explained to the patient that from podiatry as needed to help clear the mucus. She was transitioned to oral prednisone with taper. Patient was also given prescription for PPI and probiotic while she is on antibiotics and steroids. Blood cultures remain negative. She has a follow-up appointment with Ms Charlynne Cousins on 10/11 Patient uses the oxygen at home PRN. Will obtain home O2  evaluation prior to discharge.   History of right middle lobe collapse on the recent CT -CT of the chest showed bronchiectasis (known history), with areas of incipient sated intrabronchial secretions and secondary right middle lobe collapse. Follow-up patient with pulmonology.   Essential hypertension Currently stable, continue Cardizem  Day of Discharge BP 118/62 mmHg  Pulse 72  Temp(Src) 97.8 F (36.6 C) (Oral)  Resp 14  Ht 5\' 3"  (1.6 m)  Wt 55.838 kg (123 lb 1.6 oz)  BMI 21.81 kg/m2  SpO2 94%  Physical Exam: General: Alert and awake oriented x3 not in any  acute distress. HEENT: anicteric sclera, pupils reactive to light and accommodation CVS: S1-S2 clear no murmur rubs or gallops Chest: clear to auscultation bilaterally, no wheezing rales or rhonchi Abdomen: soft nontender, nondistended, normal bowel sounds Extremities: no cyanosis, clubbing or edema noted bilaterally Neuro: Cranial nerves II-XII intact, no focal neurological deficits   The results of significant diagnostics from this hospitalization (including imaging, microbiology, ancillary and laboratory) are listed below for reference.    LAB RESULTS: Basic Metabolic Panel:  Recent Labs Lab 01/27/15 1947 01/28/15 0029 01/29/15 0601  NA 138  --  139  K 3.9  --  4.3  CL 97*  --  98*  CO2 34*  --  32  GLUCOSE 106*  --  144*  BUN 7  --  16  CREATININE 0.82 0.83 0.86  CALCIUM 9.4  --  9.6   Liver Function Tests:  Recent Labs Lab 01/27/15 1947  AST 21  ALT 15  ALKPHOS 57  BILITOT 0.8  PROT 7.9  ALBUMIN 3.4*   No results for input(s): LIPASE, AMYLASE in the last 168 hours. No results for input(s): AMMONIA in the last 168 hours. CBC:  Recent Labs Lab 01/26/15 1230  01/28/15 0029 01/29/15 0601  WBC 8.1  < > 6.0 12.3*  NEUTROABS 4.2  --   --   --   HGB 14.4  < > 13.3 13.1  HCT 43.5  < > 41.1 41.0  MCV 90.4  < > 92.4 93.6  PLT 435.0*  < > 395 421*  < > = values in this interval not  displayed. Cardiac Enzymes: No results for input(s): CKTOTAL, CKMB, CKMBINDEX, TROPONINI in the last 168 hours. BNP: Invalid input(s): POCBNP CBG: No results for input(s): GLUCAP in the last 168 hours.  Significant Diagnostic Studies:  Dg Chest 2 View  01/27/2015   CLINICAL DATA:  Shortness of breath since yesterday.  Cough.  EXAM: CHEST  2 VIEW  COMPARISON:  Frontal and lateral views yesterday. Chest CT 07/13/2014  FINDINGS: The lungs remain hyperinflated. Right middle lobe atelectasis with possible collapse, unchanged from prior exam. The patchy airspace disease in the right lower lobe is unchanged to minimally improved from exam performed yesterday. Minimal streaky opacity in the retrocardiac left lower lobe, likely atelectasis. Cardiomediastinal contours are unchanged. There is no pleural effusion, pulmonary edema, or pneumothorax. No acute osseous abnormalities.  IMPRESSION: 1. Unchanged to minimally improved right lower lobe consolidation, concerning for pneumonia. 2. Atelectasis or collapse of the right middle lobe. This appears recurrent or chronic. 3. Linear opacities in the retrocardiac left lower lobe, likely atelectasis. 4. Continued radiographic follow-up recommended to resolution, as recommended previously. Bronchoscopy may be helpful if findings fail to resolve to evaluate for endobronchial lesion.   Electronically Signed   By: Jeb Levering M.D.   On: 01/27/2015 20:10   Ct Chest W Contrast  01/28/2015   CLINICAL DATA:  Shortness of breath and cough, Right lower lobe consolidation, bronchiectasis  EXAM: CT CHEST WITH CONTRAST  TECHNIQUE: Multidetector CT imaging of the chest was performed during intravenous contrast administration.  CONTRAST:  62mL OMNIPAQUE IOHEXOL 300 MG/ML  SOLN  COMPARISON:  Chest radiograph 01/27/2015, chest CT 07/13/2014  FINDINGS: Mediastinum/Nodes: Heart size is normal. No pericardial effusion. No lymphadenopathy. Trace fluid within the superior pericardial  recess. Thyroid is grossly normal.  Lungs/Pleura: Central bronchiectasis is identified with multiple areas of inspissated secretions. There is apparent occlusion of the right middle lobe bronchi, image 32, with branching low-density linear areas  within the collapsed right middle lobe, for example image 33. Patchy bibasilar, right greater than left, ground-glass airspace opacity is identified. No pleural effusion.  Upper abdomen: Small hiatal hernia. Moderate atheromatous aortic calcification without aneurysm.  Musculoskeletal: No acute osseous abnormality.  IMPRESSION: Bronchiectasis with areas of inspissated intrabronchial secretions and secondary right middle lobe collapse.  Areas of linear hypodensity within the collapsed right middle lobe may indicate fluid-filled bronchi although aspiration could appear similar. This is not significantly changed in appearance since prior similar exam.  Right lower lobe consolidation suggesting aspiration pneumonia although other alveolar filling processes such as blood, pus, or protein could appear similar. This is new since the prior similar exam 07/13/2014.  Bronchoscopy may be helpful for evaluation of a possible superimposed intrabronchial lesion due to chronicity of findings.   Electronically Signed   By: Conchita Paris M.D.   On: 01/28/2015 08:41    2D ECHO:   Disposition and Follow-up:     Discharge Instructions    Diet - low sodium heart healthy    Complete by:  As directed      Increase activity slowly    Complete by:  As directed             DISPOSITION: Home   DISCHARGE FOLLOW-UP Follow-up Information    Follow up with PARRETT,TAMMY, NP On 02/08/2015.   Specialty:  Nurse Practitioner   Why:  230 pm    Contact information:   [place] [place] [address] [phone]       Follow up with Walker Kehr, MD. Schedule an appointment as soon as possible for a visit in 2 weeks.   Specialty:  Internal Medicine   Why:  for  hospital follow-up   Contact information:   [place] [address] [phone]        Time spent on Discharge: 35 mins   Signed:   Camille Dragan M.D. [facility] 01/29/2015, 10:37 AM Pager: [phone]

## 2015-01-29 NOTE — Care Management (Addendum)
CM reviewed patient's record and met with patient to identify any discharge needs.  Patient lives at home with husband. Denies any difficulty affording monthly medications. Patient did have 2 concerns, getting a meal delivery service and obtaining prescription delivery service to conserve energy patient is on home O2. CM discussed and provided patient with printed resources for Meals on Wheels and local pharmacies that deliver. Patient verbalized understanding and appreciation for the assistance. Husband will  bring O2 tank for transport home.  No further CM needs identified.

## 2015-01-29 NOTE — Progress Notes (Signed)
[name] to be D/[name]'d Home per MD order.  Discussed prescriptions and follow up appointments with the patient. Prescriptions given to patient, medication list explained in detail. Pt verbalized understanding.    Medication List    TAKE these medications        acetaminophen 325 MG tablet  Commonly known as:  TYLENOL  Take 325 mg by mouth every 6 (six) hours as needed. For pain     albuterol 108 (90 BASE) MCG/ACT inhaler  Commonly known as:  PROAIR HFA  Inhale 2 puffs into the lungs every 4 (four) hours as needed for wheezing or shortness of breath. For shortness of breath     albuterol (2.5 MG/3ML) 0.083% nebulizer solution  Commonly known as:  PROVENTIL  Take 3 mLs (2.5 mg total) by nebulization every 4 (four) hours as needed for wheezing or shortness of breath.     benzonatate 100 MG capsule  Commonly known as:  TESSALON  Take 1 capsule (100 mg total) by mouth 3 (three) times daily as needed for cough.     budesonide-formoterol 160-4.5 MCG/ACT inhaler  Commonly known as:  SYMBICORT  Inhale 1 puff into the lungs 2 (two) times daily.     cholecalciferol 1000 UNITS tablet  Commonly known as:  VITAMIN D  Take 1,000 Units by mouth daily.     diltiazem 120 MG tablet  Commonly known as:  CARDIZEM  Take 1 tablet (120 mg total) by mouth 2 (two) times daily.     diphenhydrAMINE 25 MG tablet  Commonly known as:  BENADRYL  Take 25 mg by mouth every 6 (six) hours as needed for allergies.     FLUTTER Devi  Blow through 4 times per set and repeat 3 sets per day, to loosen lung secretions.     guaiFENesin 600 MG 12 hr tablet  Commonly known as:  MUCINEX  Take 1 tablet (600 mg total) by mouth 2 (two) times daily.     levofloxacin 25 MG/ML solution  Commonly known as:  LEVAQUIN  Take 20 mLs (500 mg total) by mouth daily. For 8 days  Start taking on:  01/30/2015     loratadine 10 MG tablet  Commonly known as:  CLARITIN  Take 10 mg by mouth daily as needed for allergies.     multivitamin tablet  Take 1 tablet by mouth daily.     omeprazole 20 MG capsule  Commonly known as:  PRILOSEC  Take 1 capsule (20 mg total) by mouth daily. While on prednisone     predniSONE 10 MG tablet  Commonly known as:  DELTASONE  Prednisone dosing: Take  Prednisone 40mg  (4 tabs) x 2 days, then taper to 30mg  (3 tabs) x 3 days, then 20mg  (2 tabs) x 3days, then 10mg  (1 tab) x 3days, then OFF.  Start taking on:  01/30/2015     saccharomyces boulardii 250 MG capsule  Commonly known as:  FLORASTOR  Take 1 capsule (250 mg total) by mouth 2 (two) times daily.     VITAMIN B COMPLEX PO  Take 1 tablet by mouth daily as needed (takes when she remembers to take it).     VITAMIN [name] PO  Take 1 tablet by mouth daily.        Filed Vitals:   01/29/15 0507  BP: 118/62  Pulse: 72  Temp: 97.8 F (36.6 [name])  Resp: 14    Skin clean, dry and intact without evidence of skin break down, no evidence of  skin tears noted. IV catheter discontinued intact. Site without signs and symptoms of complications. Dressing and pressure applied. Pt denies pain at this time. No complaints noted.  An After Visit Summary was printed and given to the patient. Patient escorted via [place], and D/[name] home via private auto.  [name] 01/29/2015 4:35 PM

## 2015-01-31 ENCOUNTER — Telehealth: Payer: Self-pay | Admitting: *Deleted

## 2015-01-31 NOTE — Telephone Encounter (Signed)
Called pt to schedule TCM appt pt states she has a hosp f/u already schedule with pulmonologist on 02/08/15 did not schedule with pcp...[name]

## 2015-02-01 ENCOUNTER — Encounter (HOSPITAL_COMMUNITY): Payer: Commercial Managed Care - HMO

## 2015-02-01 ENCOUNTER — Encounter (HOSPITAL_COMMUNITY): Admission: RE | Admit: 2015-02-01 | Payer: Commercial Managed Care - HMO | Source: Ambulatory Visit

## 2015-02-02 LAB — CULTURE, BLOOD (ROUTINE X 2)
CULTURE: NO GROWTH
Culture: NO GROWTH

## 2015-02-03 ENCOUNTER — Encounter (HOSPITAL_COMMUNITY)
Admission: RE | Admit: 2015-02-03 | Discharge: 2015-02-03 | Disposition: A | Discharge status: Home / Self Care | Payer: Commercial Managed Care - HMO | Source: Ambulatory Visit | Attending: Internal Medicine | Admitting: Internal Medicine

## 2015-02-03 ENCOUNTER — Encounter (HOSPITAL_COMMUNITY): Payer: Commercial Managed Care - HMO

## 2015-02-03 DIAGNOSIS — J479 Bronchiectasis, uncomplicated: Secondary | ICD-10-CM | POA: Insufficient documentation

## 2015-02-03 NOTE — Psychosocial Assessment (Signed)
[name] 74 y.o. female 79 day Psychosocial Note  Patient psychosocial assessment reveals a barrier to participation in Pulmonary Rehab. Psychosocial area that is currently affecting patient's rehab experience include concerns about her respiratory health. [name] has missed all of her exercise sessions over the last 3 weeks. I spoke with [name] on Tuesday and she states she was treated as an outpatient for pneumonia, however because of her inability to obtain the antibiotic in the form she could swallow, ended up being admitted to the hospital on 9/28 for IV antibiotics and discharged on 10/1. EMR documentation confirms this. She does state she feels much better and recovered a lot faster and without as much anxiety this time compared to previous hospitalizations. She credits her conditioning in pulmonary rehab. Patient does continue to exhibit positive coping skills to deal with her psychosocial concerns, and states she will return to rehab once her pulmonologist gives her the OK. She has a follow-up appointment on 10/11 and I have sent over a request for her to return to exercise prior to if pulmonologist agrees. Offered emotional support and reassurance. Patient does feel she has made progress toward Pulmonary Rehab goals. Patient reports her health and activity level has decreased in the past 30 days but remains positive that she is still on the right path to cope with her disease process and she still feels much stronger than she did upon admission to the program. She also stated she began to walk at home right after discharge, something she has been unable to do after the previous hospitalizations. Patient states husband  did notice how quickly she "bounced back" after this diagnosis of CAP. Patient reports continuing to feel positive about current and projected progression in Pulmonary Rehab. After reviewing the patient's treatment plan, the patient is making progress toward Pulmonary Rehab goals.  Patient's rate of progress toward rehab goals is good. Plan of action to help patient continue to work towards rehab goals include getting her to return to pulmonary rehab and continuing to work with her on increasing her workloads on the exercise equiptment. Will continue to monitor and evaluate progress toward psychosocial goal(s).  Goal(s) in progress: Improved management of anxiety  Help patient work toward returning to meaningful activities that improve patient's QOL and are attainable with patient's lung disease

## 2015-02-04 ENCOUNTER — Telehealth (HOSPITAL_COMMUNITY): Payer: Self-pay

## 2015-02-04 NOTE — Telephone Encounter (Signed)
Informed patient the Dr. Annamaria Boots has OKed her return to pulmonary rehab post hospitalization. Patient verbalized understanding and stated she would resume exercise classes on Tuesday.

## 2015-02-08 ENCOUNTER — Ambulatory Visit (INDEPENDENT_AMBULATORY_CARE_PROVIDER_SITE_OTHER)
Admission: RE | Admit: 2015-02-08 | Discharge: 2015-02-08 | Disposition: A | Discharge status: Home / Self Care | Payer: Commercial Managed Care - HMO | Source: Ambulatory Visit | Attending: Adult Health | Admitting: Adult Health

## 2015-02-08 ENCOUNTER — Encounter (HOSPITAL_COMMUNITY): Payer: Commercial Managed Care - HMO

## 2015-02-08 ENCOUNTER — Encounter (HOSPITAL_COMMUNITY)
Admission: RE | Admit: 2015-02-08 | Discharge: 2015-02-08 | Disposition: A | Discharge status: Home / Self Care | Payer: Commercial Managed Care - HMO | Source: Ambulatory Visit | Attending: Internal Medicine | Admitting: Internal Medicine

## 2015-02-08 ENCOUNTER — Encounter: Payer: Self-pay | Admitting: Adult Health

## 2015-02-08 ENCOUNTER — Ambulatory Visit (INDEPENDENT_AMBULATORY_CARE_PROVIDER_SITE_OTHER): Payer: Commercial Managed Care - HMO | Admitting: Adult Health

## 2015-02-08 VITALS — BP 114/60 | HR 67 | Temp 98.3°F | Ht 63.0 in | Wt 128.0 lb

## 2015-02-08 DIAGNOSIS — J189 Pneumonia, unspecified organism: Secondary | ICD-10-CM

## 2015-02-08 DIAGNOSIS — J479 Bronchiectasis, uncomplicated: Secondary | ICD-10-CM | POA: Diagnosis present

## 2015-02-08 DIAGNOSIS — J151 Pneumonia due to Pseudomonas: Secondary | ICD-10-CM

## 2015-02-08 DIAGNOSIS — J471 Bronchiectasis with (acute) exacerbation: Secondary | ICD-10-CM

## 2015-02-08 NOTE — Progress Notes (Signed)
Today, [name] exercised at [employer] [facility]. Service time was from 10:30am to 12:00pm.  The patient exercised by performing aerobic, strengthening, and stretching exercises. Oxygen saturation, heart rate, blood pressure, rate of perceived exertion, and shortness of breath were all monitored before, during, and after exercise. [name] presented with no problems at today's exercise session.  The patient did not have an increase in workload intensity during today's exercise session.  Pre-exercise vitals: . Weight kg: 57.7 . Liters of O2: ra . SpO2: 96 . HR: 70 . BP: 100/46 . CBG: na  Exercise vitals: . Highest heartrate:  82 . Lowest oxygen saturation: 97 . Highest blood pressure: 122/58 . Liters of 02: ra  Post-exercise vitals: . SpO2: 96 . HR: 66 . BP: 112/60 . Liters of O2: ra . CBG: na  Dr. Brand Males, Medical Director Dr. Marily Memos is immediately available during today's Pulmonary Rehab session for [name] on 02/08/15 at 10:30am class time

## 2015-02-08 NOTE — Assessment & Plan Note (Signed)
Improved on abx  cxr is improved   Plan  Taper Prednisone as directed.  Use Flutter valve several times a day  May use Albuterol neb As needed   follow up Dr. Annamaria Boots  In 6 weeks with chest xray  Please contact office for sooner follow up if symptoms do not improve or worsen or seek emergency care

## 2015-02-08 NOTE — Assessment & Plan Note (Signed)
Recent flare with CAP and RML collapse  PNA is improved on cxr w/ persistent RML atx Previously noted during exacerbation last year w/ clearance in June cxr .  Pt is clinically better w/ improving cxr  Cont on pulmonary hygiene regimen  She refuses symbicort , despite encouraged and pt education  Can use albuterol for now As needed    Plan  Taper Prednisone as directed.  Use Flutter valve several times a day  May use Albuterol neb As needed   follow up Dr. Annamaria Boots  In 6 weeks with chest xray  Please contact office for sooner follow up if symptoms do not improve or worsen or seek emergency care

## 2015-02-08 NOTE — Progress Notes (Signed)
   Subjective:    Patient ID: [name], female    DOB: 1940/11/15, 74 y.o.   MRN: [id number]  HPI 74 yo never smoker patient of Dr. Annamaria Boots  With bronchiectasis , left hilar mass/cyst  in 2008 w/ neg PET, RML atx w/ neg PET scan 2012 , and asthma    TEST :  Office spirometry 07/08/2014 Pre and postbronchodilator: Very severe obstructive airways disease with severe restriction of FVC and insignificant response to bronchodilator. FVC 0.90/41%, FEV1 0.44/27%, FEV1/FVC 0.49   02/08/2015 [facility] follow up  Pt returns for follow up from recent hospital admission.  She was admitted 9/29 for bronchiectasis exacerbation and CAP  CXR showed RLL consolidation and RML collapse. She was tx w/ IV abx, steroids and  Aggressive pulmonary hygiene. She is feeling better but remains weak . Has lingering cough  . Not taking symbicort. Does not want to take despite pt education . Previously tried on brovana via neb .  Will use her flutter valve and albuterol neb . Has few days left of prednisone .  She denies chest pain, orthopnea , edema or hemoptysis or fever.    Review of Systems  +VS reviewed  Constitutional:   No  weight loss, night sweats,  Fevers, chills, +fatigue, or  lassitude.  HEENT:   No headaches,  Difficulty swallowing,  Tooth/dental problems, or  Sore throat,                No sneezing, itching, ear ache, + nasal congestion, post nasal drip,   CV:  No chest pain,  Orthopnea, PND, swelling in lower extremities, anasarca, dizziness, palpitations, syncope.   GI  No heartburn, indigestion, abdominal pain, nausea, vomiting, diarrhea, change in bowel habits, loss of appetite, bloody stools.   Resp:   No chest wall deformity  Skin: no rash or lesions.  GU: no dysuria, change in color of urine, no urgency or frequency.  No flank pain, no hematuria   MS:  No joint pain or swelling.  No decreased range of motion.  No back pain.  Psych:  No change in mood or affect. No depression or  anxiety.  No memory loss.         Objective:   Physical Exam GEN: A/Ox3; pleasant , NAD, frail and elderly   HEENT:  [place]/AT,  EACs-clear, TMs-wnl, NOSE-clear, THROAT-clear, no lesions, no postnasal drip or exudate noted.   NECK:  Supple w/ fair ROM; no JVD; normal carotid impulses w/o bruits; no thyromegaly or nodules palpated; no lymphadenopathy.  RESP  Faint rhonchi  no accessory muscle use, no dullness to percussion  CARD:  RRR, no m/r/g  , no peripheral edema, pulses intact, no cyanosis or clubbing.  GI:   Soft & nt; nml bowel sounds; no organomegaly or masses detected.  Musco: Warm bil, no deformities or joint swelling noted.   Neuro: alert, no focal deficits noted.    Skin: Warm, no lesions or rashes         Assessment & Plan:

## 2015-02-08 NOTE — Patient Instructions (Signed)
Taper Prednisone as directed.  Use Flutter valve several times a day  May use Albuterol neb As needed   follow up Dr. Annamaria Boots  In 6 weeks with chest xray  Please contact office for sooner follow up if symptoms do not improve or worsen or seek emergency care

## 2015-02-10 ENCOUNTER — Encounter (HOSPITAL_COMMUNITY): Payer: Commercial Managed Care - HMO

## 2015-02-10 ENCOUNTER — Encounter (HOSPITAL_COMMUNITY)
Admission: RE | Admit: 2015-02-10 | Discharge: 2015-02-10 | Disposition: A | Discharge status: Home / Self Care | Payer: Commercial Managed Care - HMO | Source: Ambulatory Visit | Attending: Internal Medicine | Admitting: Internal Medicine

## 2015-02-10 DIAGNOSIS — J479 Bronchiectasis, uncomplicated: Secondary | ICD-10-CM | POA: Diagnosis not present

## 2015-02-10 NOTE — Progress Notes (Signed)
Today, [name] exercised at [employer]. Service time was from 1030 to 1215.  The patient exercised by performing aerobic, strengthening, and stretching exercises. Oxygen saturation, heart rate, blood pressure, rate of perceived exertion, and shortness of breath were all monitored before, during, and after exercise. [name] presented with no problems at today's exercise session.She attended risk factor reduction class today.  The patient did  have an increase in workload intensity during today's exercise session.  Pre-exercise vitals: . Weight kg: 58.3 . Liters of O2: RA . SpO2: 97 . HR: 60 . BP: 120/68 . CBG: NA  Exercise vitals: . Highest heartrate:  80 . Lowest oxygen saturation: 98 . Highest blood pressure: 132/64 . Liters of 02: RA  Post-exercise vitals: . SpO2: 95 . HR: 70 . BP: 116/58 . Liters of O2: RA . CBG: NA Dr. Brand Males, Medical Director Dr. Tamala Julian is immediately available during today's Pulmonary Rehab session for [name] on 02/10/2015  at 1030 class time.  Marland Kitchen

## 2015-02-15 ENCOUNTER — Encounter (HOSPITAL_COMMUNITY)
Admission: RE | Admit: 2015-02-15 | Discharge: 2015-02-15 | Disposition: A | Discharge status: Home / Self Care | Payer: Commercial Managed Care - HMO | Source: Ambulatory Visit | Attending: Internal Medicine | Admitting: Internal Medicine

## 2015-02-15 ENCOUNTER — Encounter (HOSPITAL_COMMUNITY): Payer: Commercial Managed Care - HMO

## 2015-02-15 DIAGNOSIS — J479 Bronchiectasis, uncomplicated: Secondary | ICD-10-CM | POA: Diagnosis not present

## 2015-02-15 NOTE — Progress Notes (Signed)
Today, [name] exercised at [employer] [facility]. Service time was from 10:30am to 12:05pm.  The patient exercised by performing aerobic, strengthening, and stretching exercises. Oxygen saturation, heart rate, blood pressure, rate of perceived exertion, and shortness of breath were all monitored before, during, and after exercise. [name] presented with no problems at today's exercise session.  The patient did have an increase in workload intensity during today's exercise session.  Pre-exercise vitals: . Weight kg: 58.0 . Liters of O2: ra . SpO2: 98 . HR: 76 . BP: 96/60 . CBG: na  Exercise vitals: . Highest heartrate:  92 . Lowest oxygen saturation: 95 . Highest blood pressure: 108/50 . Liters of 02: a  Post-exercise vitals: . SpO2: 96 . HR: 69 . BP: 100/56 . Liters of O2: ra . CBG: na  Dr. Brand Males, Medical Director Dr. Tamala Julian is immediately available during today's Pulmonary Rehab session for [name] on 02/15/15 at 10:30am class time

## 2015-02-17 ENCOUNTER — Encounter (HOSPITAL_COMMUNITY)
Admission: RE | Admit: 2015-02-17 | Discharge: 2015-02-17 | Disposition: A | Discharge status: Home / Self Care | Payer: Commercial Managed Care - HMO | Source: Ambulatory Visit | Attending: Internal Medicine | Admitting: Internal Medicine

## 2015-02-17 ENCOUNTER — Encounter (HOSPITAL_COMMUNITY): Payer: Commercial Managed Care - HMO

## 2015-02-17 DIAGNOSIS — J479 Bronchiectasis, uncomplicated: Secondary | ICD-10-CM | POA: Diagnosis not present

## 2015-02-17 NOTE — Progress Notes (Signed)
Today, [name] exercised at [facility] [facility]. Service time was from 10:30am to 12:30pm.  The patient exercised by performing aerobic, strengthening, and stretching exercises. Oxygen saturation, heart rate, blood pressure, rate of perceived exertion, and shortness of breath were all monitored before, during, and after exercise. [name] presented with no problems at today's exercise session. The patient attended education today on Advanced Directives with Jeanella Craze.  The patient did not have an increase in workload intensity during today's exercise session.  Pre-exercise vitals: . Weight kg: 58.1 . Liters of O2: ra . SpO2: 97 . HR: 69 . BP: 96/46 . CBG: na  Exercise vitals: . Highest heartrate:  86 . Lowest oxygen saturation: 96 . Highest blood pressure: 128/68 . Liters of 02: ra  Post-exercise vitals: . SpO2: 96 . HR: 69 . BP: 102/60 . Liters of O2: ra . CBG: na  Dr. Brand Males, Medical Director Dr. Wynelle Cleveland is immediately available during today's Pulmonary Rehab session for [name] on 02/17/15 at 10:30am class time.

## 2015-02-22 ENCOUNTER — Encounter (HOSPITAL_COMMUNITY)
Admission: RE | Admit: 2015-02-22 | Discharge: 2015-02-22 | Disposition: A | Discharge status: Home / Self Care | Payer: Commercial Managed Care - HMO | Source: Ambulatory Visit | Attending: Internal Medicine | Admitting: Internal Medicine

## 2015-02-22 DIAGNOSIS — J479 Bronchiectasis, uncomplicated: Secondary | ICD-10-CM | POA: Diagnosis not present

## 2015-02-22 NOTE — Progress Notes (Signed)
Today, [name] exercised at [facility] [facility]. Service time was from 1030 to 1200.  The patient exercised by performing aerobic, strengthening, and stretching exercises. Oxygen saturation, heart rate, blood pressure, rate of perceived exertion, and shortness of breath were all monitored before, during, and after exercise. Ta presented with no problems at today's exercise session.  The patient did not have an increase in workload intensity during today's exercise session.  Pre-exercise vitals: . Weight kg: 57.8 . Liters of O2: ra . SpO2: 99 . HR: 79 . BP: 104/60 . CBG: na  Exercise vitals: . Highest heartrate:  95 . Lowest oxygen saturation: 96 . Highest blood pressure: 126/56 . Liters of 02: ra  Post-exercise vitals: . SpO2: 96 . HR: 70 . BP: 104/60 . Liters of O2: ra . CBG: na Dr. Brand Males, Medical Director Dr. Wynelle Cleveland is immediately available during today's Pulmonary Rehab session for [name] on 02/22/2015  at 1030 class time.  Marland Kitchen

## 2015-02-22 NOTE — Psychosocial Assessment (Signed)
[name] 74 y.o. female  120 day Psychosocial Note  Patient psychosocial assessment reveals no barriers to participation in Pulmonary Rehab. [name] has attended all scheduled exercise sessions in the last 30 days. She states she is feeling much better since her most recent hospitalization and is almost back to her new baseline. She is continuing to exercise at home. Patient does continue to exhibit positive coping skills to deal with any psychosocial concerns that may arise. Her husband remains extremely supportive. Offered emotional support and reassurance. Patient does feel she is continuing to make progress toward Pulmonary Rehab goals. Patient reports her health and activity level has improved in the past 30 days as evidenced by patient's report of increased ability to exercise at home. Patient states her husband has noticed a change her activity or mood. He feels she is more positive about her respiratory diagnosis. Patient reports she also feels positive about current and projected progression in Pulmonary Rehab. After reviewing the patient's treatment plan, the patient is making progress toward Pulmonary Rehab goals. Patient's rate of progress toward rehab goals is good. Plan of action to help patient continue to work towards rehab goals include increasing workloads as tolerated on equipment in order to increase stamina and strength. Will continue to monitor and evaluate progress toward psychosocial goal(s).  Goal(s) in progress: Improved management of anxiety  Help patient work toward returning to meaningful activities that improve patient's QOL and are attainable with patient's lung disease

## 2015-02-24 ENCOUNTER — Encounter (HOSPITAL_COMMUNITY)
Admission: RE | Admit: 2015-02-24 | Discharge: 2015-02-24 | Disposition: A | Discharge status: Home / Self Care | Payer: Commercial Managed Care - HMO | Source: Ambulatory Visit | Attending: Internal Medicine | Admitting: Internal Medicine

## 2015-02-24 DIAGNOSIS — J479 Bronchiectasis, uncomplicated: Secondary | ICD-10-CM | POA: Diagnosis not present

## 2015-02-24 NOTE — Progress Notes (Signed)
Today, [place] exercised at [employer] [facility]. Service time was from 1030 to 1210.  The patient exercised by performing aerobic, strengthening, and stretching exercises. Oxygen saturation, heart rate, blood pressure, rate of perceived exertion, and shortness of breath were all monitored before, during, and after exercise. Shakira presented with no problems at today's exercise session.  Patient attended the Diaphragmatic and Purse Lip Breathing class today.    The patient did not have an increase in workload intensity during today's exercise session.  Pre-exercise vitals: . Weight kg: 56.6 . Liters of O2: ra . SpO2: 95 . HR: 78 . BP: 100/66 . CBG: na  Exercise vitals: . Highest heartrate:  76 . Lowest oxygen saturation: 92 . Highest blood pressure: 96/60 . Liters of 02: ra  Post-exercise vitals: . SpO2: 96 . HR: 69 . BP: 112/74 . Liters of O2: RA . CBG: na Dr. Brand Males, Medical Director Dr. Tamala Julian is immediately available during today's Pulmonary Rehab session for Elyn Aquas on 02/24/2015  at 1030 class time  .

## 2015-03-01 ENCOUNTER — Encounter (HOSPITAL_COMMUNITY)
Admission: RE | Admit: 2015-03-01 | Discharge: 2015-03-01 | Disposition: A | Discharge status: Home / Self Care | Payer: Commercial Managed Care - HMO | Source: Ambulatory Visit | Attending: Internal Medicine | Admitting: Internal Medicine

## 2015-03-01 DIAGNOSIS — J479 Bronchiectasis, uncomplicated: Secondary | ICD-10-CM | POA: Diagnosis not present

## 2015-03-01 NOTE — Progress Notes (Signed)
[name] 74 y.o. female    120 day Psychosocial Note  Patient psychosocial assessment reveals no barriers to participation in Pulmonary Rehab. Psychosocial areas that are currently affecting patient's rehab experience emotional problems as evidenced by depression and loss of interest in usual activities only on her sick days.  Pt states she is overall feeling well and has increased energy since her pulmonary rehab experience.  Pt reports she now has tools and knowledge to apply to help overcome feelings of depression and sadness on her sick days.  Pt is also able to verbalize a plan to exercise mild to moderately inside her home on sick days by walking 5-10 minutes 3 times daily on sick days when she can not get out of the home. Patient does continue to exhibit positive coping skills to deal with her psychosocial concerns. Offered emotional support and reassurance. Patient does feel she is making progress toward Pulmonary Rehab goals. Patient reports her health and activity level has improved in the past 30 days as evidenced by patient's report of increased ability to exercise and attend [employer].   Patient states family/friends have noticed changes in her activity or mood. Patient reports  feeling positive about current and projected progression in Pulmonary Rehab. After reviewing the patient's treatment plan, the patient is making progress toward Pulmonary Rehab goals. Patient's rate of progress toward rehab goals is good. Plan of action to help patient continue to work towards rehab goals upon graduation include planning mild to moderate exercise inside home on sick days to keep energy up and depression levels down.  Pt will plan activities outside of the home that include periods of rest and moderate her activities so she does not get exhausted. Pt verbalizes she knows how to only shop at 1-2 stores instead of going all day.  Goal(s) in progress: Improved management of depression Improved  coping skills Help patient work toward returning to meaningful activities that improve patient's QOL and are attainable with patient's lung disease

## 2015-03-01 NOTE — Progress Notes (Signed)
[name] completed a Six-Minute Walk Test on 03/01/15 . Jerusalen walked 1274 feet with 0 breaks.  The patient's lowest oxygen saturation was 96 %, highest heart rate was 103 bpm , and highest blood pressure was 140/60. The patient was on room air. Patient stated that nothing hindered their walk test.

## 2015-03-04 NOTE — Progress Notes (Signed)
Pulmonary Rehab Discharge Note: [name] has been discharged from pulmonary rehab after successfully completing 22 exercise and education session. [name] increased her stamina and strength while in the program as evidenced by her ability to walk an additional 187 on her discharge 6 min walk test as compared to her initial one. Per [name], she has met both of her personal goals despite a brief hospitalization for a recent pulmonary infection. She now has an increased energy level and is able to participate in more social activities with her husband. She is also able to cook more meals. She plans to continue to exercise by walking at home.

## 2015-03-04 NOTE — Progress Notes (Addendum)
The [place] Pulmonary Rehabilitation Final/Discharge Outcome Results  Anthropometrics: . Height (inches): 63 . Weight (kg): 59.0 ? This is a change of +5.8kg from entrance. . Grip strength was measured using a Dynamometer.  The patient's discharge score was a 28.   ? This is a change of 0 from entrance.  Functional Status/Exercise Capacity: Marland Kitchen [name] had a resting heart rate of 81 BPM, a resting blood pressure of 118/56, and an oxygen saturation of 95 % on room air.  [name] performed a discharge 6-minute walk test on 03/01/15.  The patient completed 1274 feet in 6 minutes with 0 rest breaks.  This quantifies 2.84 METS. The patient's goal is to add 82 feet onto the baseline 6MWT.   ? The patient increased their 6-minute walk test distance by 187 feet and their MET level by .31 METs.  Dyspnea Measures: . The [facility] is a simple and standardized method of classifying disability in patients with COPD.  The assessment correlates disability and dyspnea.  Upon discharge the patients resting score was 1. The scale is provided below.  ? This is a change of 0 from entrance.  0= I only get breathless with strenuous exercise. 1= I get short of breath when hurrying on level ground or walking up a slight incline. 2= On level ground, I walk slower than people of the same age because of breathlessness, or have to stop for breath when walking at my own pace. 3= I stop for breath after walking 100 yards or after a few minutes on level ground. 4=I am too breathless to leave the house or I am breathless when dressing.   . The patient completed the [place] ([facility]).  This questionnaire relates activities of daily living and shortness of breath.  The score ranges from 0-120, a higher score relates to severe shortness of breath during activities of daily living. The patient's score at discharge was 63. ? This is a change of -23 from  entrance.  Quality of Life: . Ferrans and Powers Quality of Life Index Pulmonary Version is used to assess the patients satisfaction in different domains of their life; health and functioning, socioeconomic, psychological/spiritual, and family. The overall score is recorded out of 30 points.  The patient's goal is to achieve an overall score of 21 or higher.  [name] did not complete her Quality of Life survey.  . The Patient Health Questionnaire (PHQ-2) is a first step approach for the screening of depression.  If the patient scores positive on the PHQ-2 the patient should be further assessed with the PHQ-9.  The Patient Health Questionnaire (PHQ-9) assesses the degree of depression.  Depression is important to monitor and track in pulmonary patients due to its prevalence in the population.  If the patient advances to the PHQ-9 the goal is to score less than 4 on this assessment.  [name] scored a 2 on the PHQ-2 and a 2 on the PHQ-9 at discharge. ? This is a change of positive from entrance.   Clinical Assessment Tools: . The COPD Assessment Test (CAT) is a measurement tool to quantify how much of an impact the disease has on the patient's life.  This assessment aids the Pulmonary Rehab Team in designing the patients individualized treatment plan.  A CAT score ranges from 0-40.  A score of 10 or below indicates that COPD has a low impact on the patient's life whereas a score of 30 or  higher indicates a severe impact. The patient's goal is a decrease of 1 point from entrance to discharge.  [name] had a CAT score of 24 upon discharge.   ? This is a change of -8 from entrance.  Nutrition: . The "Rate My Plate" is a dietary assessment that quantifies the balance of a patient's diet.  This tool allows the Pulmonary Rehab Team to key in on the areas of the patient's diet that needs improving.  The team can then focus their nutritional education on those areas.  If the patient scores 24-40, this means there are many  ways they can make their eating habits healthier, 41-57 states that there are some ways they can make their eating habits healthier and a score of 58-72 states that they are making many healthy choices.  The patient's goal is to achieve a score of 49 or higher on this assessment.  [name] scored a 50 upon discharge.   ? This is a change of +1 from entrance.  Oxygen Compliance: . Patient is currently on 0 liters at rest, 0 liters at night, and 0 liters for exercise.  [name] is not currently using cpap/bipap at night.    ? This is a no change result from entrance.    Education: . [name] attended more than 75% of the education classes.  [name] completed a discharge educational assessment and achieved a score of 8/14.  ? This is a change of -2 from entrance.   Smoking Cessation:  The patient is not currently a smoker.  Exercise: Marland Kitchen [name] was provided with an individualized Home Exercise Prescription (HEP) at the entrance of the program.  The patient's goal is to be exercising 30-60 minutes, 3-5 days per week. Upon discharge the patient is exercising 1-2 days at home.  This is a change of +1-2 days from entrance.  After graduation from Pulmonary Rehab, [name] will continue exercising at The [facility].

## 2015-03-26 ENCOUNTER — Telehealth: Payer: Self-pay | Admitting: Critical Care Medicine

## 2015-03-26 ENCOUNTER — Encounter (HOSPITAL_COMMUNITY): Payer: Self-pay | Admitting: Emergency Medicine

## 2015-03-26 ENCOUNTER — Emergency Department (HOSPITAL_COMMUNITY)
Admission: EM | Admit: 2015-03-26 | Discharge: 2015-03-26 | Disposition: A | Discharge status: Home / Self Care | Payer: Commercial Managed Care - HMO | Attending: Emergency Medicine | Admitting: Emergency Medicine

## 2015-03-26 ENCOUNTER — Emergency Department (HOSPITAL_COMMUNITY): Payer: Commercial Managed Care - HMO

## 2015-03-26 DIAGNOSIS — R0602 Shortness of breath: Secondary | ICD-10-CM | POA: Diagnosis present

## 2015-03-26 DIAGNOSIS — Z8659 Personal history of other mental and behavioral disorders: Secondary | ICD-10-CM | POA: Insufficient documentation

## 2015-03-26 DIAGNOSIS — Z880 Allergy status to penicillin: Secondary | ICD-10-CM | POA: Insufficient documentation

## 2015-03-26 DIAGNOSIS — J441 Chronic obstructive pulmonary disease with (acute) exacerbation: Secondary | ICD-10-CM

## 2015-03-26 DIAGNOSIS — Z91040 Latex allergy status: Secondary | ICD-10-CM | POA: Diagnosis not present

## 2015-03-26 DIAGNOSIS — I10 Essential (primary) hypertension: Secondary | ICD-10-CM | POA: Diagnosis not present

## 2015-03-26 DIAGNOSIS — Z8701 Personal history of pneumonia (recurrent): Secondary | ICD-10-CM | POA: Insufficient documentation

## 2015-03-26 DIAGNOSIS — Z79899 Other long term (current) drug therapy: Secondary | ICD-10-CM | POA: Insufficient documentation

## 2015-03-26 DIAGNOSIS — Z7951 Long term (current) use of inhaled steroids: Secondary | ICD-10-CM | POA: Diagnosis not present

## 2015-03-26 DIAGNOSIS — J45901 Unspecified asthma with (acute) exacerbation: Secondary | ICD-10-CM

## 2015-03-26 LAB — BASIC METABOLIC PANEL
Anion gap: 8 (ref 5–15)
BUN: 9 mg/dL (ref 6–20)
CO2: 30 mmol/L (ref 22–32)
Calcium: 9.5 mg/dL (ref 8.9–10.3)
Chloride: 101 mmol/L (ref 101–111)
Creatinine, Ser: 0.76 mg/dL (ref 0.44–1.00)
GFR calc Af Amer: >60 mL/min (ref >60)
GFR calc non Af Amer: >60 mL/min (ref >60)
Glucose, Bld: 112 mg/dL — ABNORMAL HIGH (ref 65–99)
Potassium: 4.2 mmol/L (ref 3.5–5.1)
Sodium: 139 mmol/L (ref 135–145)

## 2015-03-26 LAB — CBC WITH DIFFERENTIAL/PLATELET
Basophils Absolute: 0.1 10*3/uL (ref 0.0–0.1)
Basophils Relative: 1 %
Eosinophils Absolute: 0.5 10*3/uL (ref 0.0–0.7)
Eosinophils Relative: 6 %
HCT: 46.3 % — ABNORMAL HIGH (ref 36.0–46.0)
Hemoglobin: 14.7 g/dL (ref 12.0–15.0)
Lymphocytes Relative: 22 %
Lymphs Abs: 1.8 10*3/uL (ref 0.7–4.0)
MCH: 29.9 pg (ref 26.0–34.0)
MCHC: 31.7 g/dL (ref 30.0–36.0)
MCV: 94.3 fL (ref 78.0–100.0)
Monocytes Absolute: 0.5 10*3/uL (ref 0.1–1.0)
Monocytes Relative: 5 %
Neutro Abs: 5.5 10*3/uL (ref 1.7–7.7)
Neutrophils Relative %: 66 %
Platelets: 266 10*3/uL (ref 150–400)
RBC: 4.91 MIL/uL (ref 3.87–5.11)
RDW: 14.2 % (ref 11.5–15.5)
WBC: 8.3 10*3/uL (ref 4.0–10.5)

## 2015-03-26 MED ORDER — PROMETHAZINE-DM 6.25-15 MG/5ML PO SYRP: 5.0000 mL | ORAL_SOLUTION | Freq: Four times a day (QID) | ORAL | Status: DC | PRN

## 2015-03-26 MED ORDER — IPRATROPIUM BROMIDE 0.02 % IN SOLN
0.5000 mg | RESPIRATORY_TRACT | Status: AC
  Administered 2015-03-26: 0.5 mg via RESPIRATORY_TRACT
  Filled 2015-03-26: qty 2.5

## 2015-03-26 MED ORDER — PREDNISONE 50 MG PO TABS: 50.0000 mg | ORAL_TABLET | Freq: Every day | ORAL | Status: DC

## 2015-03-26 MED ORDER — ALBUTEROL (5 MG/ML) CONTINUOUS INHALATION SOLN
15.0000 mg/h | INHALATION_SOLUTION | RESPIRATORY_TRACT | Status: AC
  Administered 2015-03-26: 15 mg/h via RESPIRATORY_TRACT
  Filled 2015-03-26: qty 20

## 2015-03-26 MED ORDER — METHYLPREDNISOLONE SODIUM SUCC 125 MG IJ SOLR
125.0000 mg | INTRAMUSCULAR | Status: AC
  Administered 2015-03-26: 125 mg via INTRAVENOUS
  Filled 2015-03-26: qty 2

## 2015-03-26 MED ORDER — GUAIFENESIN ER 1200 MG PO TB12: 1.0000 | ORAL_TABLET | Freq: Two times a day (BID) | ORAL | Status: AC

## 2015-03-26 MED ORDER — PREDNISONE 20 MG PO TABS: 60.0000 mg | ORAL_TABLET | Freq: Once | ORAL | Status: DC

## 2015-03-26 MED ORDER — ALBUTEROL SULFATE (2.5 MG/3ML) 0.083% IN NEBU
5.0000 mg | INHALATION_SOLUTION | Freq: Once | RESPIRATORY_TRACT | Status: AC
  Administered 2015-03-26: 5 mg via RESPIRATORY_TRACT
  Filled 2015-03-26: qty 6

## 2015-03-26 NOTE — Telephone Encounter (Signed)
Call from patient stating that she had been using her albuterol nebulizer every 4 hours and it did not appear to be lasting the full four hours.  She stated she had used her symbicort inhaler yesterday AM at 9am and it appeared to be lasting longer than the nebulizer.  She states it was ordered for BID dosing but she had not taken another dose.  She was concerned about taking another dose in light of using the nebulizer.  The patient is alert and oriented by phone conversation.  She is talking in complete sentences but does have a soft intermittent wheeze on exhalation.  I recommended that she could take another dose of the symbicort since this drug did give her relief.    I suggested if she continues to have problems to contact the on-call MD in AM and/or if symptoms worsen she would need to come to the emergency room for evaluation and treatment.  It was later that I saw on her medication record that she had not been taking the symibcort.

## 2015-03-26 NOTE — ED Provider Notes (Signed)
CSN: [id number]     Arrival date & time 03/26/15  0715 History   First MD Initiated Contact with Patient 03/26/15 [phone]     Chief Complaint  Patient presents with  . Shortness of Breath     (Consider location/radiation/quality/duration/timing/severity/associated sxs/prior Treatment) HPI Patient presents to the emergency department with shortness of breath and wheezing that started last week.  The patient states that she has gotten worse over the last 2 days.  Patient states that she has been using her albuterol nebulizers and Symbicort every few hours.  The patient did not realize Symbicort was not meant to be used like this.  Patient states that nothing seems to make her condition better or worse.  She states that she has not had any chest pain, nausea, vomiting, diarrhea, weakness, headache, blurred vision, back pain, neck pain, fever, runny nose, sore throat, back pain, dysuria, incontinence, hematemesis, bloody stool, near syncope or syncope.  The patient states that she did notice that his activity she would get more wheezing Past Medical History  Diagnosis Date  . Anxiety   . Asthma   . HTN (hypertension)   . Allergic rhinitis   . Shortness of breath dyspnea   . Pneumonia    Past Surgical History  Procedure Laterality Date  . Tubal ligation     Family History  Problem Relation Age of Onset  . Diabetes Father   . Asthma Father   . Allergies Father   . Mental illness Mother     alzheimer's  . Allergies Brother   . Allergies Brother   . Heart disease Maternal Aunt    Social History  Substance Use Topics  . Smoking status: Never Smoker   . Smokeless tobacco: Never Used     Comment: father smoked, worked w/smokers  . Alcohol Use: No   OB History    No data available     Review of Systems  All other systems negative except as documented in the HPI. All pertinent positives and negatives as reviewed in the HPI.=  Allergies  Penicillins; Shellfish allergy; Sulfonamide  derivatives; Aspirin; Azithromycin; Bee venom; Clindamycin; Fluticasone-salmeterol; Fruit & vegetable daily; Latex; Montelukast sodium; and Ventolin  Home Medications   Prior to Admission medications   Medication Sig Start Date End Date Taking? Authorizing Provider  acetaminophen (TYLENOL) 325 MG tablet Take 325 mg by mouth every 6 (six) hours as needed. For pain   Yes Historical Provider, MD  albuterol (PROAIR HFA) 108 (90 BASE) MCG/ACT inhaler Inhale 2 puffs into the lungs every 4 (four) hours as needed for wheezing or shortness of breath. For shortness of breath 08/30/14  Yes Jola Schmidt, MD  albuterol (PROVENTIL) (2.5 MG/3ML) 0.083% nebulizer solution Take 3 mLs (2.5 mg total) by nebulization every 4 (four) hours as needed for wheezing or shortness of breath. 10/05/14  Yes Deneise Lever, MD  Ascorbic Acid (VITAMIN C PO) Take 1 tablet by mouth daily.    Yes Historical Provider, MD  B Complex Vitamins (VITAMIN B COMPLEX PO) Take 1 tablet by mouth daily as needed (takes when she remembers to take it).    Yes Historical Provider, MD  budesonide-formoterol (SYMBICORT) 160-4.5 MCG/ACT inhaler Inhale 1 puff into the lungs 2 (two) times daily. 06/07/11  Yes Aleksei Plotnikov V, MD  cholecalciferol (VITAMIN D) 1000 UNITS tablet Take 1,000 Units by mouth daily.   Yes Historical Provider, MD  diltiazem (CARDIZEM) 120 MG tablet Take 1 tablet (120 mg total) by mouth 2 (two) times  daily. 11/16/14  Yes Aleksei Plotnikov V, MD  guaiFENesin (MUCINEX) 600 MG 12 hr tablet Take 1 tablet (600 mg total) by mouth 2 (two) times daily. 01/29/15  Yes Ripudeep Krystal Eaton, MD  Multiple Vitamin (MULTIVITAMIN) tablet Take 1 tablet by mouth daily.     Yes Historical Provider, MD  saccharomyces boulardii (FLORASTOR) 250 MG capsule Take 1 capsule (250 mg total) by mouth 2 (two) times daily. 01/29/15  Yes Ripudeep Krystal Eaton, MD  benzonatate (TESSALON) 100 MG capsule Take 1 capsule (100 mg total) by mouth 3 (three) times daily as needed for  cough. 01/29/15   Ripudeep Krystal Eaton, MD  diphenhydrAMINE (BENADRYL) 25 MG tablet Take 25 mg by mouth every 6 (six) hours as needed for allergies.    Historical Provider, MD  levofloxacin (LEVAQUIN) 25 MG/ML solution Take 20 mLs (500 mg total) by mouth daily. For 8 days Patient not taking: Reported on 02/08/2015 01/30/15   Ripudeep Krystal Eaton, MD  loratadine (CLARITIN) 10 MG tablet Take 10 mg by mouth daily as needed for allergies.  08/16/10   Aleksei Plotnikov V, MD  omeprazole (PRILOSEC) 20 MG capsule Take 1 capsule (20 mg total) by mouth daily. While on prednisone Patient not taking: Reported on 03/26/2015 01/29/15   Ripudeep Krystal Eaton, MD  predniSONE (DELTASONE) 10 MG tablet Prednisone dosing: Take  Prednisone 40mg  (4 tabs) x 2 days, then taper to 30mg  (3 tabs) x 3 days, then 20mg  (2 tabs) x 3days, then 10mg  (1 tab) x 3days, then OFF. Patient not taking: Reported on 03/26/2015 01/30/15   Ripudeep Krystal Eaton, MD  Respiratory Therapy Supplies (FLUTTER) DEVI Blow through 4 times per set and repeat 3 sets per day, to loosen lung secretions. 07/16/14   Deneise Lever, MD   BP 110/56 mmHg  Pulse 88  Temp(Src) 97.8 F (36.6 C)  Resp 21  SpO2 99% Physical Exam  Constitutional: She is oriented to person, place, and time. She appears well-developed and well-nourished. No distress.  HENT:  Head: Normocephalic and atraumatic.  Mouth/Throat: Oropharynx is clear and moist.  Eyes: Pupils are equal, round, and reactive to light.  Neck: Normal range of motion. Neck supple.  Cardiovascular: Normal rate, regular rhythm and normal heart sounds.  Exam reveals no gallop and no friction rub.   No murmur heard. Pulmonary/Chest: Effort normal. No respiratory distress. She has wheezes.  Abdominal: Soft. Bowel sounds are normal. She exhibits no distension. There is no tenderness.  Neurological: She is alert and oriented to person, place, and time. She exhibits normal muscle tone. Coordination normal.  Skin: Skin is warm and dry. No  rash noted. No erythema.  Psychiatric: She has a normal mood and affect. Her behavior is normal.  Nursing note and vitals reviewed.   ED Course  Procedures (including critical care time) Labs Review Labs Reviewed  BASIC METABOLIC PANEL - Abnormal; Notable for the following:    Glucose, Bld 112 (*)    All other components within normal limits  CBC WITH DIFFERENTIAL/PLATELET - Abnormal; Notable for the following:    HCT 46.3 (*)    All other components within normal limits    Imaging Review Dg Chest Portable 1 View  03/26/2015  CLINICAL DATA:  Shortness of breath and frequent urination beginning yesterday. Breathing worse today. EXAM: PORTABLE CHEST 1 VIEW COMPARISON:  02/08/2015 FINDINGS: Lungs are hyperexpanded without consolidation or effusion. Cardiomediastinal silhouette is within normal. Minimal calcified plaque over the aortic arch. Mild degenerative change of the spine. IMPRESSION:  No acute cardiopulmonary disease. Electronically Signed   By: Marin Olp M.D.   On: 03/26/2015 08:28   I have personally reviewed and evaluated these images and lab results as part of my medical decision-making.   EKG Interpretation   Date/Time:  Saturday March 26 2015 08:56:43 EST Ventricular Rate:  99 PR Interval:  157 QRS Duration: 106 QT Interval:  407 QTC Calculation: 522 R Axis:   -38 Text Interpretation:  Sinus rhythm Consider right atrial enlargement Left  axis deviation Consider right ventricular hypertrophy Borderline T  abnormalities, anterior leads Prolonged QT interval no significant change  since Sept 29 2016 Confirmed by Regenia Skeeter  MD, [place] ([id number]) on 03/26/2015  9:06:06 AM      MDM   Final diagnoses:  None     Patient was reexamined 2 and her wheezing was minimal on the first reexamination after second single nebulized treatment.  She had no further wheezing.  The patient was feeling better.  Patient was ambulated and did not have any oxygen saturation issues.   Patient is advised follow-up with her primary care Dr. told to return here as needed.  Patient agrees the plan and all questions were answered  Dalia Heading, PA-C 03/28/15 [place], MD 03/31/15 [phone]

## 2015-03-26 NOTE — Progress Notes (Signed)
Rt took pt off cont neb and placed back on 2L [place], which is her home amount. Pt states she feels much better post tx. Pt states she feels her tongue is swollen and she thinks she is allergic to Albuterol. RN made aware. RT will continue to monitor.

## 2015-03-26 NOTE — Discharge Instructions (Signed)
Return here as needed.  Follow-up with your primary care doctor.  Return here for any worsening in your condition

## 2015-03-26 NOTE — Progress Notes (Signed)
Pt reported she had been using her MDI repeatedly last night with no relief, however, pt told RT she had been using SYMBICORT not a rescue MDI. It was explained to pt that symbicort is not a rescue and should not be taken more than every 12 hours. Rt inquired about rescue mdi or HHn at home to which pt states she has both but did not know what to use so she used symbicort. RT counseled pt on which medication is best to use when. Pt states she understands. RT will continue to monitor.

## 2015-03-26 NOTE — ED Notes (Addendum)
Pt reports two days of shortness of breath. PT reports using her albuterol inhaler about every three minutes last night. Pt also does at home nebulizer's. Pt has audible wheezing. Pt reports hx of asthma and pneumonia as recently as this year with three day hospital admission.

## 2015-03-28 ENCOUNTER — Ambulatory Visit (INDEPENDENT_AMBULATORY_CARE_PROVIDER_SITE_OTHER): Payer: Commercial Managed Care - HMO | Admitting: Adult Health

## 2015-03-28 ENCOUNTER — Encounter: Payer: Self-pay | Admitting: Adult Health

## 2015-03-28 VITALS — BP 122/78 | HR 74 | Ht 63.0 in | Wt 130.0 lb

## 2015-03-28 DIAGNOSIS — J4531 Mild persistent asthma with (acute) exacerbation: Secondary | ICD-10-CM

## 2015-03-28 DIAGNOSIS — J189 Pneumonia, unspecified organism: Secondary | ICD-10-CM

## 2015-03-28 MED ORDER — BUDESONIDE-FORMOTEROL FUMARATE 160-4.5 MCG/ACT IN AERO: 2.0000 | INHALATION_SPRAY | Freq: Two times a day (BID) | RESPIRATORY_TRACT | Status: DC

## 2015-03-28 NOTE — Addendum Note (Signed)
Addended by: Beckie Busing on: 03/28/2015 03:47 PM   Modules accepted: Orders

## 2015-03-28 NOTE — Patient Instructions (Signed)
Restart Symbicort 2 puffs twice daily, rinse after use Use Ventolin or albuterol only as needed for rescue. For shortness of breath and wheezing. Take prednisone as directed.  Use Flutter valve several times a day  Follow up  Dr. Annamaria Boots in 2 months and as needed Please contact office for sooner follow up if symptoms do not improve or worsen or seek emergency care

## 2015-03-28 NOTE — Assessment & Plan Note (Signed)
Recent flare now resolving   Plan  Restart Symbicort 2 puffs twice daily, rinse after use Use Ventolin or albuterol only as needed for rescue. For shortness of breath and wheezing. Take prednisone as directed.  Use Flutter valve several times a day  Follow up  Dr. Annamaria Boots in 2 months and as needed Please contact office for sooner follow up if symptoms do not improve or worsen or seek emergency care

## 2015-03-28 NOTE — Progress Notes (Signed)
   Subjective:    Patient ID: [name], female    DOB: [birth year], 74 y.o.   MRN: [id number]  HPI  74 yo never smoker patient of Dr. Annamaria Boots  With bronchiectasis , left hilar mass/cyst  in 2008 w/ neg PET, RML atx w/ neg PET scan 2012 , and asthma    TEST :  Office spirometry 07/08/2014 Pre and postbronchodilator: Very severe obstructive airways disease with severe restriction of FVC and insignificant response to bronchodilator. FVC 0.90/41%, FEV1 0.44/27%, FEV1/FVC 0.49   03/28/2015 ER follow up  Pt returns for ER follow up .  Seen in ER 03/26/15 for COPD , tx w/ prednisone burst.  She is starting to feel some better. Has few days left of prednisone .  She was admitted 9/29 for bronchiectasis exacerbation and CAP  CXR showed RLL consolidation and RML collapse. She was tx w/ IV abx, steroids  CXR last month showed improvement.  She says she was getting better but cough started back up last week.  She went to ER , now feeling some better.  Not taking symbicort on reg basis. She has been using proair most days  We discussed using controller mainly . Only use proair As needed  .      Review of Systems  Constitutional:   No  weight loss, night sweats,  Fevers, chills, +fatigue, or  lassitude.  HEENT:   No headaches,  Difficulty swallowing,  Tooth/dental problems, or  Sore throat,                No sneezing, itching, ear ache, + nasal congestion, post nasal drip,   CV:  No chest pain,  Orthopnea, PND, swelling in lower extremities, anasarca, dizziness, palpitations, syncope.   GI  No heartburn, indigestion, abdominal pain, nausea, vomiting, diarrhea, change in bowel habits, loss of appetite, bloody stools.   Resp:   No chest wall deformity  Skin: no rash or lesions.  GU: no dysuria, change in color of urine, no urgency or frequency.  No flank pain, no hematuria   MS:  No joint pain or swelling.  No decreased range of motion.  No back pain.  Psych:  No change in mood or  affect. No depression or anxiety.  No memory loss.         Objective:   Physical Exam  GEN: A/Ox3; pleasant , NAD, frail and elderly   VS reviewed   HEENT:  [place]/AT,  EACs-clear, TMs-wnl, NOSE-clear, THROAT-clear, no lesions, no postnasal drip or exudate noted.   NECK:  Supple w/ fair ROM; no JVD; normal carotid impulses w/o bruits; no thyromegaly or nodules palpated; no lymphadenopathy.  RESP  CTA  no accessory muscle use, no dullness to percussion  CARD:  RRR, no m/r/g  , no peripheral edema, pulses intact, no cyanosis or clubbing.  GI:   Soft & nt; nml bowel sounds; no organomegaly or masses detected.  Musco: Warm bil, no deformities or joint swelling noted.   Neuro: alert, no focal deficits noted.    Skin: Warm, no lesions or rashes         Assessment & Plan:

## 2015-03-31 ENCOUNTER — Encounter: Payer: Self-pay | Admitting: Cardiology

## 2015-05-25 DIAGNOSIS — J410 Simple chronic bronchitis: Secondary | ICD-10-CM | POA: Diagnosis not present

## 2015-06-07 ENCOUNTER — Telehealth: Payer: Self-pay | Admitting: Internal Medicine

## 2015-06-07 MED ORDER — PREDNISONE 10 MG PO TABS: ORAL_TABLET | ORAL | Status: DC

## 2015-06-07 NOTE — Telephone Encounter (Signed)
Prod cough with clear/light yellow mucus, wheezing, tightness, increased SOB x1 week.  Denies any f/c/s or hemoptysis.  Taking maintenance meds + mucinex.  Dr Annamaria Boots please advise, thank you.  Last ov 11.28.16 w/ TP: Patient Instructions       Restart Symbicort 2 puffs twice daily, rinse after use Use Ventolin or albuterol only as needed for rescue. For shortness of breath and wheezing. Take prednisone as directed.   Use Flutter valve several times a day   Follow up  Dr. Annamaria Boots in 2 months and as needed Please contact office for sooner follow up if symptoms do not improve or worsen or seek emergency care     Allergies  Allergen Reactions  . Penicillins Swelling  . Shellfish Allergy Anaphylaxis  . Sulfonamide Derivatives Swelling    Throat swelling  . Aspirin Other (See Comments)    States stomach bubbles, becomes gaseous and irritated  . Azithromycin     "makes me gag"  . Bee Venom   . Clindamycin     REACTION: Neck, tongue swelling, SOB \\T \ rash  . Fluticasone-Salmeterol     REACTION: hoarseness  . Fruit & Vegetable Daily [Nutritional Supplements]     Tongue swelling, vomiting  . Latex Itching and Swelling  . Montelukast Sodium     REACTION: hallucination  . Ventolin [Albuterol]     cough

## 2015-06-07 NOTE — Telephone Encounter (Signed)
Called spoke with pt. Aware of recs. RX sent in. Nothing further needed 

## 2015-06-07 NOTE — Telephone Encounter (Signed)
Patient returned call, CB [phone]

## 2015-06-07 NOTE — Telephone Encounter (Signed)
lmtcb X1 for pt.  Will call in med after speaking to pt.

## 2015-06-07 NOTE — Telephone Encounter (Signed)
Continue routine meds Add prednisone 10 mg, # 20, 4 X 2 DAYS, 3 X 2 DAYS, 2 X 2 DAYS, 1 X 2 DAYS

## 2015-06-08 ENCOUNTER — Telehealth: Payer: Self-pay | Admitting: Internal Medicine

## 2015-06-08 NOTE — Telephone Encounter (Signed)
Spoke with the pt She wanted to review how pred taper should be taken  I have explained to her how to take med-4 X 2 DAYS, 3 X 2 DAYS, 2 X 2 DAYS, 1 X 2 DAYS She verbalized understanding and nothing further needed

## 2015-06-25 DIAGNOSIS — J410 Simple chronic bronchitis: Secondary | ICD-10-CM | POA: Diagnosis not present

## 2015-07-11 ENCOUNTER — Ambulatory Visit: Payer: Self-pay | Admitting: Internal Medicine

## 2015-07-12 ENCOUNTER — Encounter: Payer: Self-pay | Admitting: Internal Medicine

## 2015-07-12 ENCOUNTER — Ambulatory Visit (INDEPENDENT_AMBULATORY_CARE_PROVIDER_SITE_OTHER): Payer: Medicare Other | Admitting: Internal Medicine

## 2015-07-12 VITALS — BP 148/70 | HR 85 | Ht 62.0 in | Wt 132.6 lb

## 2015-07-12 DIAGNOSIS — R220 Localized swelling, mass and lump, head: Secondary | ICD-10-CM

## 2015-07-12 DIAGNOSIS — J479 Bronchiectasis, uncomplicated: Secondary | ICD-10-CM | POA: Diagnosis not present

## 2015-07-12 NOTE — Progress Notes (Signed)
Subjective:    Patient ID: [name], female    DOB: [birth year], 75 y.o.   MRN: [id number]  HPI 10/04/10-69 yoF never smoker with hx of asthma, left hilar mass/ PET negative.. Last here in 2008 after hospital stay, when she saw [name] noting issues of hypertension and asthma meds. Notes reviewed. Seen now on referral by [name] after recent flare with shortness of breath, productive cough. She tends to feel better as long as she is on an antibiotic or steroids. Within a couple of weeks off, she will start to cough and get tight again. Had bronchitis as a child. In the last 5-6 years wheezing dyspnea has been more perennial. Occasionally wakes with cough. Sputum often yellow. Has had pneumonia x 2 with pneumovax. Little GERD or acid indigestion recognized. Triggers have included strong odors, seasonal pollens, chest colds, but not house dust. Had a sinus infection during the winter. Has been treated for heart rhythm and BP, but denies MI/ angina. She is using Symbicort 160 now and considers it sufficient used twice every day. She did not fill a script for a Proair rescue inhaler. 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  11/10/10- 69 yoF never smoker with hx of asthma, left hilar mass/ PET neg in 2008/ now progressive, RML atelectasis. CT 11/03/10- showed change progression since prior studies of 2008, when abnormalities / PET negative, were attributed to scarring. -Now 2.6 x 2,2 cm left hilar mass, slowly growing. Post obstructive LLL opacity. New RML collapse. Scattered bronchiectasis. No effusion. Denies fever, night sweat, nodes. Cough has been a little less, with brownish to clear mucus, no blood. Has to chew deliberately to avoid choking. Has rarely awakened with hard cough. Symbicort has helped cough. No weight loss. No chest pain, but some shifting back pain. We reviewed her images and the technique and risks of bronchoscopy.   12/12/10- 69 yoF never smoker with hx of asthma, left hilar mass/ PET  neg in 2008/ now progressive, RML atelectasis. PET neg 2012. Husband here  Post bronchoscopy f/u - Bronch 11/30/10- necrotic appearing mass occluding left lower lobe bronchus.  Path- small lymphocytes and eosinophils, neg for malignancy or granuloma. Cultures negative so far.  She still coughs up phlegm, but less than before bronchoscopy.  Aware of a variable nodule in left axilla- comes and goes, otherwise no lumps, nodes or etc. Occasional sweat on morning waking, otherwise no fever or sweat and no pain or blood. She has been aware "something is going on" but not toxic.   11/23/11-  25 yoF never smoker with hx of asthma, left hilar mass/ PET neg in 2008/ now progressive, RML atelectasis. PET neg 2012.  She says she is doing "fair". She has had bronchoscopy/ [name] after me ."Benign inflammatory"  Coughs more with stress. She had gone to emergency room June 8 with anxiety and shortness of breath. We subsequently called in prednisone taper. She denies fever, sweat, adenopathy or chest pain. Often short of breath if anxious. She avoids exertion such as climbing stairs. Prednisone helped cough more productive and she feels better having taken it. Phlegm now is yellow, sometimes darker with no blood. She says that "a bug bite escalates my anxiety". Lab-eosinophils 8%, always high. CXR 10/06/11 IMPRESSION:  No acute infiltrate or pulmonary edema. There is a cavitary lesion  in the left lower lobe retrocardiac region measures at least 2 cm.  Further evaluation with enhanced CT of the chest is recommended.  Original Report Authenticated By: [name]  POP, M.D.    12/25/11-  77 yoF never smoker with hx of asthma, left hilar mass/ PET neg in 2008/ now progressive, RML atelectasis. PET neg 2012.  States breathing has gotten better since last ov; review CT with patient in more detail Off prednisone since July 26. Did not need Symbicort. Cough produces some light yellow sputum but she denies fever, sweat, chest  pain. She wakes coughing occasionally. CT chest 12/25/11- images reviewed with her: IMPRESSION:  Interval decrease in size of the left infrahilar lesion which has  become air-filled in the interval. It is possible that this could  represent some central cystic bronchiectasis or decompression of  the previous fluid density nodule.  Interval re-expansion of the right middle lobe.  No new or progressive findings.  Original Report Authenticated By: [name], M.D.   06/24/12- 83 yoF never smoker with hx of asthma, left hilar mass/ PET neg in 2008/ then progressive, RML atelectasis. PET neg 2012.  FOLLOWS FOR: was doing pretty good until today-started having increased SOB ; unsure if wheezing. Family here With persistent questioning we determined that for the past month she had been noticing orthopnea, propping up to sleep. No palpitation ankle edema. Over the last 3 or 4 days she began noting dry throat and some mild low back ache. She had had diarrhea for a few days after church parking in mid February but that had resolved except for some residual urgency. No fever, chills. Sputum usually white, rarely brown. No chest pain, blood or swollen glands  07/08/12 71 yoF never smoker with hx of asthma, left hilar mass/ PET neg in 2008/ then progressive, RML atelectasis. PET neg 2012.    Son here FOLLOWS FOR: states breathing is slightly better since 2 weeks ago; ROOM AIR O2 of 90% upon arrival She feels she is doing better she comes in looking well dressed, stronger and comfortable occasional productive cough, scant clear sputum or little yellow-brown. No blood. Denies chest pain, fever or sweat. She is carrying her oxygen. Labs 06/24/12- CBC normaal except eosinophilia 9%. LFTs normal. Chemistry normal except CO2 34 suggesting compensation for respiratory acidosis. CT chest 06/26/12- images were reviewed with her and her son. IMPRESSION:  1. No evidence for acute pulmonary embolus.  2. Ground-glass  attenuation and peripheral nodular consolidation  within the right lower lobe consistent with pneumonitis. Findings  likely sequela of aspiration and/or pneumonia.  3. Cavitary lesion versus cystic bronchiectasis within the  perihilar left lower lobe. That this is unchanged in size from  previous exam but now contains a central soft tissue attenuating  filling defect which may represent a mycetoma.  4. Diffuse, bilateral bronchial wall thickening which is favored  to represent the sequela of chronic aspiration and bronchitis.  Original Report Authenticated By: Kerby Moors, M.D.  08/12/12- 16 yoF never smoker with hx of asthma, left hilar mass/ PET neg in 2008/ then progressive, RML atelectasis. PET neg 2012.    Son here FOLLOWS FOR: review Barium swallow results in detail with patient; having SOB and wheezing-worse with activity She reports feeling much better compared with last winter. Still some cough and shortness of breath white or slightly brown sputum. Dyspnea with vigorous exertion/stairs. Denies sweats or fever, chest pain or palpitation. Steroids make her feel better. Sleeps with O2 2L/ [facility]. Barium swallow 07/15/12- negative for evidence of reflux or aspiration.  09/19/12- 55 yoF never smoker with hx of asthma/ bronchiectasis, left hilar mass/cyst PET neg in 2008/ then progressive, RML  atelectasis. PET neg 2012.     FOLLOWS FOR: dry nasal area, watery eyes, cough that just started today. Strong perfume made her nose run. Eyes fdry CXR 08/22/12 IMPRESSION:  No edema or consolidation. The lesions seen recently on CT in the  left infrahilar region is not seen on chest radiography. In this  regard, suggest correlation with chest CT to further assess this  area on the left.  Original Report Authenticated By: Lowella Grip, M.D.  07/08/14- 58 yoF never smoker with hx of asthma/ bronchiectasis, left hilar mass/cyst PET neg in 2008/ then progressive, RML atelectasis. PET neg 2012.  Female companion here FOLLOWS FOR/ACUTE VISIT: Last seen 2014; having increased mucus-hard to get up; SOB and wheezing-worse with activity. No fevers however slight warmness at times. Waking more frequently with cough and choking that sounds like reflux events. Difficult for her to cough out phlegm. Denies fever or sweat. Admits shortness of breath particularly with exertion. Sleeps with head up on pillows. CT chest 06/26/12- reviewed with her IMPRESSION: 1. No evidence for acute pulmonary embolus. 2. Ground-glass attenuation and peripheral nodular consolidation within the right lower lobe consistent with pneumonitis. Findings likely sequela of aspiration and/or pneumonia. 3. Cavitary lesion versus cystic bronchiectasis within the perihilar left lower lobe. That this is unchanged in size from previous exam but now contains a central soft tissue attenuating filling defect which may represent a mycetoma. 4. Diffuse, bilateral bronchial wall thickening which is favored to represent the sequela of chronic aspiration and bronchitis. Original Report Authenticated By: Kerby Moors, M.D. CXR 02/18/14 IMPRESSION: Nodular opacities and persistent collapse of right middle lung. Suggest CT to exclude neoplasm. Diffuse emphysematous changes in the lungs. Electronically Signed  By: Lucienne Capers M.D.  On: 02/18/2014 04:51 Office spirometry 07/08/2014 Pre and postbronchodilator: Very severe obstructive airways disease with severe restriction of FVC and insignificant response to bronchodilator. FVC 0.90/41%, FEV1 0.44/27%, FEV1/FVC 0.49  ROS-see HPI Constitutional:   No-   weight loss, night sweats, fevers, chills, fatigue, lassitude. HEENT:   No-  headaches, difficulty swallowing, tooth/dental problems, sore throat,       No-  sneezing, itching, ear ache, nasal congestion, post nasal drip,  CV:  No-   chest pain, +orthopnea, no-PND, swelling in lower extremities, anasarca,  dizziness,  palpitations Resp: +   shortness of breath with exertion or at rest.              +  productive cough,  + non-productive cough,  No- coughing up of blood.              No-change in color of mucus.  No- wheezing.   Skin: No-   rash or lesions. GI:  No-   heartburn, indigestion, abdominal pain, nausea, vomiting,  GU: . MS:  No-   joint pain or swelling.   Neuro-     nothing unusual Psych:  No- change in mood or affect. No depression or anxiety.  No memory loss.  OBJ- Physical Exam General- Alert, Oriented, Affect-appropriate, Distress- none acute. Thin Skin- rash-none, lesions- none, excoriation- none   Lymphadenopathy- none Head- atraumatic            Eyes- Gross vision intact, PERRLA, conjunctivae and secretions clear            Ears- +somewhat hard of hearing            Nose- Clear, no-Septal dev, mucus, polyps, erosion, perforation  Throat- Mallampati II , mucosa clear , drainage- none, tonsils- atrophic Neck- flexible , trachea midline, no stridor , thyroid nl, carotid no bruit Chest - symmetrical excursion , unlabored           Heart/CV- RRR , no murmur , no gallop  , no rub, nl s1 s2                           - JVD- none , edema- none, stasis changes- none, varices- none           Lung- + wheeze and squeaks, unlabored, cough- none , dullness-none, rub- none           Chest wall-  Abd- Br/ Gen/ Rectal- Not done, not indicated Extrem- cyanosis- none, clubbing, none, atrophy- none, strength- nl Neuro- grossly intact to observation     Subjective:    Patient ID: [name], female    DOB: Mar 08, 1941, 75 y.o.   MRN: [id number]  HPI 10/04/10-69 yoF never smoker with hx of asthma, left hilar mass/ PET negative.. Last here in 2008 after hospital stay, when she saw [name] noting issues of hypertension and asthma meds. Notes reviewed. Seen now on referral by [name] after recent flare with shortness of breath, productive cough. She tends to feel better as long as she  is on an antibiotic or steroids. Within a couple of weeks off, she will start to cough and get tight again. Had bronchitis as a child. In the last 5-6 years wheezing dyspnea has been more perennial. Occasionally wakes with cough. Sputum often yellow. Has had pneumonia x 2 with pneumovax. Little GERD or acid indigestion recognized. Triggers have included strong odors, seasonal pollens, chest colds, but not house dust. Had a sinus infection during the winter. Has been treated for heart rhythm and BP, but denies MI/ angina. She is using Symbicort 160 now and considers it sufficient used twice every day. She did not fill a script for a Proair rescue inhaler. 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  11/10/10- 69 yoF never smoker with hx of asthma, left hilar mass/ PET neg in 2008/ now progressive, RML atelectasis. CT 11/03/10- showed change progression since prior studies of 2008, when abnormalities / PET negative, were attributed to scarring. -Now 2.6 x 2,2 cm left hilar mass, slowly growing. Post obstructive LLL opacity. New RML collapse. Scattered bronchiectasis. No effusion. Denies fever, night sweat, nodes. Cough has been a little less, with brownish to clear mucus, no blood. Has to chew deliberately to avoid choking. Has rarely awakened with hard cough. Symbicort has helped cough. No weight loss. No chest pain, but some shifting back pain. We reviewed her images and the technique and risks of bronchoscopy.   12/12/10- 69 yoF never smoker with hx of asthma, left hilar mass/ PET neg in 2008/ now progressive, RML atelectasis. PET neg 2012. Husband here  Post bronchoscopy f/u - Bronch 11/30/10- necrotic appearing mass occluding left lower lobe bronchus.  Path- small lymphocytes and eosinophils, neg for malignancy or granuloma. Cultures negative so far.  She still coughs up phlegm, but less than before bronchoscopy.  Aware of a variable nodule in left axilla- comes and goes, otherwise no lumps, nodes or etc. Occasional  sweat on morning waking, otherwise no fever or sweat and no pain or blood. She has been aware "something is going on" but not toxic.   11/23/11-  41 yoF never smoker with hx of asthma, left hilar  mass/ PET neg in 2008/ now progressive, RML atelectasis. PET neg 2012.  She says she is doing "fair". She has had bronchoscopy/ [name] after me ."Benign inflammatory"  Coughs more with stress. She had gone to emergency room June 8 with anxiety and shortness of breath. We subsequently called in prednisone taper. She denies fever, sweat, adenopathy or chest pain. Often short of breath if anxious. She avoids exertion such as climbing stairs. Prednisone helped cough more productive and she feels better having taken it. Phlegm now is yellow, sometimes darker with no blood. She says that "a bug bite escalates my anxiety". Lab-eosinophils 8%, always high. CXR 10/06/11 IMPRESSION:  No acute infiltrate or pulmonary edema. There is a cavitary lesion  in the left lower lobe retrocardiac region measures at least 2 cm.  Further evaluation with enhanced CT of the chest is recommended.  Original Report Authenticated By: Lahoma Crocker, M.D.    12/25/11-  46 yoF never smoker with hx of asthma, left hilar mass/ PET neg in 2008/ now progressive, RML atelectasis. PET neg 2012.  States breathing has gotten better since last ov; review CT with patient in more detail Off prednisone since July 26. Did not need Symbicort. Cough produces some light yellow sputum but she denies fever, sweat, chest pain. She wakes coughing occasionally. CT chest 12/25/11- images reviewed with her: IMPRESSION:  Interval decrease in size of the left infrahilar lesion which has  become air-filled in the interval. It is possible that this could  represent some central cystic bronchiectasis or decompression of  the previous fluid density nodule.  Interval re-expansion of the right middle lobe.  No new or progressive findings.  Original Report  Authenticated By: [name], M.D.   06/24/12- 69 yoF never smoker with hx of asthma, left hilar mass/ PET neg in 2008/ then progressive, RML atelectasis. PET neg 2012.  FOLLOWS FOR: was doing pretty good until today-started having increased SOB ; unsure if wheezing. Family here With persistent questioning we determined that for the past month she had been noticing orthopnea, propping up to sleep. No palpitation ankle edema. Over the last 3 or 4 days she began noting dry throat and some mild low back ache. She had had diarrhea for a few days after church parking in mid February but that had resolved except for some residual urgency. No fever, chills. Sputum usually white, rarely brown. No chest pain, blood or swollen glands  07/08/12 71 yoF never smoker with hx of asthma, left hilar mass/ PET neg in 2008/ then progressive, RML atelectasis. PET neg 2012.    Son here FOLLOWS FOR: states breathing is slightly better since 2 weeks ago; ROOM AIR O2 of 90% upon arrival She feels she is doing better she comes in looking well dressed, stronger and comfortable occasional productive cough, scant clear sputum or little yellow-brown. No blood. Denies chest pain, fever or sweat. She is carrying her oxygen. Labs 06/24/12- CBC normaal except eosinophilia 9%. LFTs normal. Chemistry normal except CO2 34 suggesting compensation for respiratory acidosis. CT chest 06/26/12- images were reviewed with her and her son. IMPRESSION:  1. No evidence for acute pulmonary embolus.  2. Ground-glass attenuation and peripheral nodular consolidation  within the right lower lobe consistent with pneumonitis. Findings  likely sequela of aspiration and/or pneumonia.  3. Cavitary lesion versus cystic bronchiectasis within the  perihilar left lower lobe. That this is unchanged in size from  previous exam but now contains a central soft tissue attenuating  filling  defect which may represent a mycetoma.  4. Diffuse, bilateral bronchial  wall thickening which is favored  to represent the sequela of chronic aspiration and bronchitis.  Original Report Authenticated By: Kerby Moors, M.D.  08/12/12- 9 yoF never smoker with hx of asthma, left hilar mass/ PET neg in 2008/ then progressive, RML atelectasis. PET neg 2012.    Son here FOLLOWS FOR: review Barium swallow results in detail with patient; having SOB and wheezing-worse with activity She reports feeling much better compared with last winter. Still some cough and shortness of breath white or slightly brown sputum. Dyspnea with vigorous exertion/stairs. Denies sweats or fever, chest pain or palpitation. Steroids make her feel better. Sleeps with O2 2L/ [facility]. Barium swallow 07/15/12- negative for evidence of reflux or aspiration.  09/19/12- 81 yoF never smoker with hx of asthma/ bronchiectasis, left hilar mass/cyst PET neg in 2008/ then progressive, RML atelectasis. PET neg 2012.     FOLLOWS FOR: dry nasal area, watery eyes, cough that just started today. Strong perfume made her nose run. Eyes fdry CXR 08/22/12 IMPRESSION:  No edema or consolidation. The lesions seen recently on CT in the  left infrahilar region is not seen on chest radiography. In this  regard, suggest correlation with chest CT to further assess this  area on the left.  Original Report Authenticated By: Lowella Grip, M.D.  07/08/14- 81 yoF never smoker with hx of asthma/ bronchiectasis, left hilar mass/cyst PET neg in 2008/ then progressive, RML atelectasis. PET neg 2012. Female companion here FOLLOWS FOR/ACUTE VISIT: Last seen 2014; having increased mucus-hard to get up; SOB and wheezing-worse with activity. No fevers however slight warmness at times. Waking more frequently with cough and choking that sounds like reflux events. Difficult for her to cough out phlegm. Denies fever or sweat. Admits shortness of breath particularly with exertion. Sleeps with head up on pillows. CT chest 06/26/12- reviewed with  her IMPRESSION: 1. No evidence for acute pulmonary embolus. 2. Ground-glass attenuation and peripheral nodular consolidation within the right lower lobe consistent with pneumonitis. Findings likely sequela of aspiration and/or pneumonia. 3. Cavitary lesion versus cystic bronchiectasis within the perihilar left lower lobe. That this is unchanged in size from previous exam but now contains a central soft tissue attenuating filling defect which may represent a mycetoma. 4. Diffuse, bilateral bronchial wall thickening which is favored to represent the sequela of chronic aspiration and bronchitis. Original Report Authenticated By: Kerby Moors, M.D. CXR 02/18/14 IMPRESSION: Nodular opacities and persistent collapse of right middle lung. Suggest CT to exclude neoplasm. Diffuse emphysematous changes in the lungs. Electronically Signed  By: Lucienne Capers M.D.  On: 02/18/2014 04:51 Office spirometry 07/08/2014 Pre and postbronchodilator: Very severe obstructive airways disease with severe restriction of FVC and insignificant response to bronchodilator. FVC 0.90/41%, FEV1 0.44/27%, FEV1/FVC 0.49  09/06/14- 73 yoF never smoker with hx of asthma/ bronchiectasis, retained secretions, left hilar mass/cyst PET neg in 2008/ then progressive, RML atelectasis. PET neg 2012   husband here FOLLOWS FOR: was seen at [facility] ED 5/2 last week for bronchospasms.  pt today c/o prod cough with lots of yellow mucus. ER gave nebulizer treatment, prednisone and Z-Pak.  Got ? Chest cold. CXR 08/29/14- My review> note atelctatic areas have cleared  IMPRESSION: Hyperinflated lungs with chronic bronchitic markings. No acute findings. Electronically Signed  By: Suzy Bouchard M.D.  On: 08/29/2014 22:00  10/12/14- 73 yoF never smoker with hx of asthma/ bronchiectasis, retained secretions, left hilar mass/cyst PET neg in 2008/ then progressive,  RML atelectasis. PET neg 2012   husband here ACUTE VISIT: Pt  called stating she has used her nebulizer about 10 times since last night without relief; comes and goes. Pt having increased SOB and wheezing,cough-small amounts of phelgm-beige in color. Pt was "hot" but never checked for fever. She had called [place] June 7 reporting 3 days of wheeze and malaise. We sent doxycycline and 3 days of prednisone. She has been using her nebulizer regularly and stopped Symbicort. As soon as she ran out of prednisone  wheezing got worse. No fever, chills, chest pain or purulent sputum and no sudden choking events. Using home oxygen at 2 L.  01/06/15-  73 yoF never smoker with hx of asthma/ bronchiectasis, retained secretions, left hilar mass/cyst PET neg in 2008/ then progressive, RML atelectasis. PET neg 2012    ACUTE: pt c/o wheezing , coughing, heavy legs since monday.  increase in SOB.  pt c/o of productive cough clear in color.  We gave script for avelox to use if needed  CXR 10/12/14 There is peribronchial thickening particularly in the right middle lobe. No consolidative infiltrates or atelectasis or effusions. Heart size and vascularity are normal. No osseous abnormality. IMPRESSION: Bronchitic changes, most prominent in the right middle lobe. Electronically Signed  By: Lorriane Shire M.D.  On: 10/12/2014 13:55  01/26/15- 67 yoF never smoker with hx of asthma/ bronchiectasis, retained secretions, left hilar mass/cyst PET neg in 2008/ then progressive, RML atelectasis. PET neg 2012  Acute visit:  tougue swelling started after taking Doxycyline RX. Pt has non productive couhg-gags instead. No fevers as well. We gave Avelox to hold at last ov which she says she did not take. Called Rx doxy 9/26, but she called today saying swollen tongue, malaise, cough, sob after 2 doses of doxycycline. She has complained of swollen tongue with multiple medications. ComplaiFOLLOWS FOR: Pt states she is taking her meds correctly and eating right since last visit and feeling well. Only  had to call in 1 time for Prednisone and no other ED visits since 03-2015 Pt noted tongue swelling every morning recently.ns of "no energy" may be chills but no fever. Feels she has to keep her oxygen continuously. Nonproductive cough. Using nebulizer every 4 hours because she feels she needs it but relief is temporary.  07/12/2015-75 year old female never smoker followed for asthma/bronchiectasis, retained secretions, left hilar mass/cyst PET negative in 2008/then progressive RML atelectasis, PET neg 2012 FOLLOWS FOR: Pt states she is taking her meds correctly and eating right since last visit and feeling well. Only had to call in 1 time for Prednisone and no other ED visits since 03-2015 Pt noted tongue swelling every morning recently. ER visit for asthma 03/26/2015 with office follow-up 03/28/2015. Pulmonary Rehabilitation. No respiratory problems since exacerbation of asthma/bronchitis with ER visit in November. She is using flutter regularly and feels this prevents the accumulation of airway mucus. Also using Symbicort. Has a feeling of tongue swelling many mornings on waking over the last month. Has taken Benadryl a couple of times which seemed to help, but the feeling goes away anyway when she is up and around. No skin rash. No ACE inhibitors. CXR 03/26/2015 IMPRESSION: No acute cardiopulmonary disease. Electronically Signed  By: Marin Olp M.D.  On: 03/26/2015 08:28  ROS-see HPI Constitutional:   No-   weight loss, night sweats, fevers, + chills, fatigue, lassitude. HEENT:   No-  headaches, difficulty swallowing, tooth/dental problems, sore throat,       No-  sneezing, itching,  ear ache, nasal congestion, post nasal drip,  CV:  No-   chest pain, +orthopnea, no-PND, swelling in lower extremities, anasarca,  dizziness, palpitations Resp: +   shortness of breath with exertion or at rest.             + productive cough,  + non-productive cough,  No- coughing up of blood.               +change in color of mucus. +wheezing.   Skin: No-   rash or lesions. GI:  No-   heartburn, indigestion, abdominal pain, nausea, vomiting,  GU: . MS:  No-   joint pain or swelling.   Neuro-     nothing unusual Psych:  No- change in mood or affect. No depression or anxiety.  No memory loss.  OBJ- Physical Exam General- Alert, Oriented, Affect-appropriate, Distress- none acute. Thin Skin- rash-none, lesions- none, excoriation- none   Lymphadenopathy- none Head- atraumatic            Eyes- Gross vision intact, PERRLA, conjunctivae and secretions clear            Ears- +somewhat hard of hearing            Nose- Clear, no-Septal dev, mucus, polyps, erosion, perforation             Throat- Mallampati II , mucosa + slightly coated, drainage- none, tonsils- atrophic, tongue is not swollen Neck- flexible , trachea midline, no stridor , thyroid nl, carotid no bruit Chest - symmetrical excursion , unlabored           Heart/CV- RRR , no murmur , no gallop  , no rub, nl s1 s2                           - JVD- none , edema- none, stasis changes- none, varices- none           Lung-  wheeze -none ,  cough + raspy, dullness-none, rub- none, breathing unlabored           Chest wall-  Abd- Br/ Gen/ Rectal- Not done, not indicated Extrem- cyanosis- none, clubbing, none, atrophy- none, strength- wheelchair  Neuro- grossly intact to observation

## 2015-07-12 NOTE — Patient Instructions (Signed)
For the dry mouth in the morning-  Try otc Biotene mouth rinse at bedtime  Try benadryl at bedtime  If you keep having the tongue swelling, please let me know and I will try a treatment for yeast in the mouth

## 2015-07-12 NOTE — Assessment & Plan Note (Signed)
Only noticed in the mornings on waking and without urticaria. Swallowing and breathing not impaired. Tongue is slightly coated silicone question possibility she is feeling thrush from her Symbicort. May also be a problem of dryness if she is mouth breathing at night. Plan-emphasize mouth rinse carefully after Symbicort. Try OTC Biotene at bedtime. Benadryl when necessary. If it continues we may want to treat for thrush to see if that helps.

## 2015-07-12 NOTE — Assessment & Plan Note (Signed)
No exacerbation at this visit. Regular use of flutter definitely helps.

## 2015-07-23 DIAGNOSIS — J410 Simple chronic bronchitis: Secondary | ICD-10-CM | POA: Diagnosis not present

## 2015-08-04 ENCOUNTER — Telehealth: Payer: Self-pay | Admitting: Internal Medicine

## 2015-08-04 NOTE — Telephone Encounter (Signed)
Pt wants to know if Dr Annamaria Boots will sign her handicap form.  Please advise.

## 2015-08-04 NOTE — Telephone Encounter (Signed)
?   handicapped parking? yes

## 2015-08-04 NOTE — Telephone Encounter (Signed)
Pt aware that she can bring her Handicapped placard by and drop it off for Dr Annamaria Boots.   Pt plans to bring this by this week. Nothing further needed.

## 2015-08-05 ENCOUNTER — Telehealth: Payer: Self-pay | Admitting: Internal Medicine

## 2015-08-05 NOTE — Telephone Encounter (Signed)
[name] returned this form back to triage earlier in the day.   Called and spoke to pt. Informed her we received the form back from CY. Pt requesting the form be mailed to her home - address verified. Form placed in outgoing mail. Pt verbalized understanding and denied any further questions or concerns at this time.

## 2015-08-23 DIAGNOSIS — J410 Simple chronic bronchitis: Secondary | ICD-10-CM | POA: Diagnosis not present

## 2015-08-24 ENCOUNTER — Telehealth: Payer: Self-pay | Admitting: Internal Medicine

## 2015-08-24 MED ORDER — PREDNISONE 20 MG PO TABS: 20.0000 mg | ORAL_TABLET | Freq: Every day | ORAL | Status: DC

## 2015-08-24 MED ORDER — DOXYCYCLINE HYCLATE 100 MG PO TABS: 100.0000 mg | ORAL_TABLET | Freq: Two times a day (BID) | ORAL | Status: DC

## 2015-08-24 NOTE — Telephone Encounter (Signed)
Per CY:  Doxy 100mg  #14, 1 tab po BID and Prednisone 20mg  tabs, #5 1 tab po QD then stop.   Called and spoke to pt. Informed her of the recs per CY. Rx sent to preferred pharmacy. Pt verbalized understanding and denied any further questions or concerns at this time.

## 2015-08-24 NOTE — Telephone Encounter (Signed)
Called, spoke with pt.  C/o wheezing and feels "breathing is heavy" with activity x 10 days.  Has some chest tightness and minimal cough.  Cough with very small amount of clear mucus.  Denies SOB at rest, CP, or fever.  Was taking mucinex 600 mg qd and stopped this x 3-4 days ago d/t no relief.  Currently taking albuterol neb and tylenol with relief.  Requesting recs - Dr. Annamaria Boots, please advise.  Thank you.  Last OV with CY: 07/12/15; asked to f/u in 6 months. Pending OV with CY: 01/17/16  [facility]  Allergies  Allergen Reactions  . Penicillins Swelling  . Shellfish Allergy Anaphylaxis  . Sulfonamide Derivatives Swelling    Throat swelling  . Aspirin Other (See Comments)    States stomach bubbles, becomes gaseous and irritated  . Azithromycin     "makes me gag"  . Bee Venom   . Clindamycin     REACTION: Neck, tongue swelling, SOB \\T \ rash  . Fluticasone-Salmeterol     REACTION: hoarseness  . Fruit & Vegetable Daily [Nutritional Supplements]     Tongue swelling, vomiting  . Latex Itching and Swelling  . Montelukast Sodium     REACTION: hallucination  . Ventolin [Albuterol]     cough     Current Outpatient Prescriptions on File Prior to Visit  Medication Sig Dispense Refill  . acetaminophen (TYLENOL) 325 MG tablet Take 325 mg by mouth every 6 (six) hours as needed. For pain    . albuterol (PROAIR HFA) 108 (90 BASE) MCG/ACT inhaler Inhale 2 puffs into the lungs every 4 (four) hours as needed for wheezing or shortness of breath. For shortness of breath 1 Inhaler 0  . albuterol (PROVENTIL) (2.5 MG/3ML) 0.083% nebulizer solution Take 3 mLs (2.5 mg total) by nebulization every 4 (four) hours as needed for wheezing or shortness of breath. 75 mL 5  . Ascorbic Acid (VITAMIN C PO) Take 1 tablet by mouth daily.     . B Complex Vitamins (VITAMIN B COMPLEX PO) Take 1 tablet by mouth daily as needed (takes when she remembers to take it).     . budesonide-formoterol (SYMBICORT) 160-4.5  MCG/ACT inhaler Inhale 2 puffs into the lungs 2 (two) times daily. 1 Inhaler 5  . cholecalciferol (VITAMIN D) 1000 UNITS tablet Take 1,000 Units by mouth daily.    Marland Kitchen diltiazem (CARDIZEM) 120 MG tablet Take 1 tablet (120 mg total) by mouth 2 (two) times daily. 60 tablet 11  . diphenhydrAMINE (BENADRYL) 25 MG tablet Take 25 mg by mouth every 6 (six) hours as needed for allergies.    . Guaifenesin 1200 MG TB12 Take 1 tablet (1,200 mg total) by mouth 2 (two) times daily. 20 each 0  . loratadine (CLARITIN) 10 MG tablet Take 10 mg by mouth daily as needed for allergies.     . Multiple Vitamin (MULTIVITAMIN) tablet Take 1 tablet by mouth daily.      . promethazine-dextromethorphan (PROMETHAZINE-DM) 6.25-15 MG/5ML syrup Take 5 mLs by mouth 4 (four) times daily as needed for cough. 120 mL 0  . Respiratory Therapy Supplies (FLUTTER) DEVI Blow through 4 times per set and repeat 3 sets per day, to loosen lung secretions. 1 each 0   No current facility-administered medications on file prior to visit.

## 2015-09-22 DIAGNOSIS — J410 Simple chronic bronchitis: Secondary | ICD-10-CM | POA: Diagnosis not present

## 2015-10-23 DIAGNOSIS — J410 Simple chronic bronchitis: Secondary | ICD-10-CM | POA: Diagnosis not present

## 2015-11-22 DIAGNOSIS — J410 Simple chronic bronchitis: Secondary | ICD-10-CM | POA: Diagnosis not present

## 2015-12-05 ENCOUNTER — Other Ambulatory Visit: Payer: Self-pay | Admitting: Internal Medicine

## 2015-12-09 ENCOUNTER — Encounter (HOSPITAL_COMMUNITY): Payer: Self-pay | Admitting: Emergency Medicine

## 2015-12-09 ENCOUNTER — Emergency Department (HOSPITAL_COMMUNITY): Payer: Medicare Other

## 2015-12-09 ENCOUNTER — Emergency Department (HOSPITAL_COMMUNITY)
Admission: EM | Admit: 2015-12-09 | Discharge: 2015-12-09 | Disposition: A | Discharge status: Home / Self Care | Payer: Medicare Other | Attending: Emergency Medicine | Admitting: Emergency Medicine

## 2015-12-09 DIAGNOSIS — J45901 Unspecified asthma with (acute) exacerbation: Secondary | ICD-10-CM | POA: Diagnosis not present

## 2015-12-09 DIAGNOSIS — J189 Pneumonia, unspecified organism: Secondary | ICD-10-CM | POA: Insufficient documentation

## 2015-12-09 DIAGNOSIS — R079 Chest pain, unspecified: Secondary | ICD-10-CM | POA: Diagnosis not present

## 2015-12-09 DIAGNOSIS — Z91040 Latex allergy status: Secondary | ICD-10-CM | POA: Diagnosis not present

## 2015-12-09 DIAGNOSIS — I10 Essential (primary) hypertension: Secondary | ICD-10-CM | POA: Diagnosis not present

## 2015-12-09 DIAGNOSIS — R05 Cough: Secondary | ICD-10-CM | POA: Diagnosis not present

## 2015-12-09 DIAGNOSIS — R0789 Other chest pain: Secondary | ICD-10-CM | POA: Diagnosis not present

## 2015-12-09 LAB — CBC
HCT: 43.3 % (ref 36.0–46.0)
HEMOGLOBIN: 13.6 g/dL (ref 12.0–15.0)
MCH: 28.5 pg (ref 26.0–34.0)
MCHC: 31.4 g/dL (ref 30.0–36.0)
MCV: 90.8 fL (ref 78.0–100.0)
PLATELETS: 324 10*3/uL (ref 150–400)
RBC: 4.77 MIL/uL (ref 3.87–5.11)
RDW: 14 % (ref 11.5–15.5)
WBC: 8.3 10*3/uL (ref 4.0–10.5)

## 2015-12-09 LAB — BASIC METABOLIC PANEL
ANION GAP: 8 (ref 5–15)
BUN: 12 mg/dL (ref 6–20)
CALCIUM: 9.3 mg/dL (ref 8.9–10.3)
CO2: 31 mmol/L (ref 22–32)
CREATININE: 0.81 mg/dL (ref 0.44–1.00)
Chloride: 100 mmol/L — ABNORMAL LOW (ref 101–111)
GFR calc Af Amer: >60 mL/min (ref >60)
Glucose, Bld: 82 mg/dL (ref 65–99)
POTASSIUM: 3.4 mmol/L — AB (ref 3.5–5.1)
SODIUM: 139 mmol/L (ref 135–145)

## 2015-12-09 LAB — I-STAT TROPONIN, ED
TROPONIN I, POC: 0 ng/mL (ref 0.00–0.08)
TROPONIN I, POC: 0 ng/mL (ref 0.00–0.08)

## 2015-12-09 MED ORDER — LEVOFLOXACIN 500 MG PO TABS
500.0000 mg | ORAL_TABLET | Freq: Once | ORAL | Status: AC
  Administered 2015-12-09: 500 mg via ORAL
  Filled 2015-12-09: qty 1

## 2015-12-09 MED ORDER — IPRATROPIUM-ALBUTEROL 0.5-2.5 (3) MG/3ML IN SOLN
3.0000 mL | Freq: Once | RESPIRATORY_TRACT | Status: AC
  Administered 2015-12-09: 3 mL via RESPIRATORY_TRACT
  Filled 2015-12-09: qty 3

## 2015-12-09 MED ORDER — LEVOFLOXACIN 500 MG PO TABS: 500.0000 mg | ORAL_TABLET | Freq: Every day | ORAL | 0 refills | Status: AC

## 2015-12-09 MED ORDER — PREDNISONE 10 MG PO TABS: 40.0000 mg | ORAL_TABLET | Freq: Every day | ORAL | 0 refills | Status: AC

## 2015-12-09 NOTE — ED Notes (Signed)
Dr. Schlossman at bedside at this time.  

## 2015-12-09 NOTE — ED Triage Notes (Signed)
Pt. reports central chest pain radiating to left ribcage with SOB , emesis and productive cough onset this evening , denies fever or diaphoresis .

## 2015-12-09 NOTE — ED Provider Notes (Signed)
[facility] DEPT Provider Note   CSN: [id number] Arrival date & time: 12/09/15  0219  First Provider Contact:  None       History   Chief Complaint Chief Complaint  Patient presents with  . Chest Pain    HPI [name] is a 75 y.o. female.  HPI   Right sided chest pain, like annoying ache, there with coughing.  and left side pain started today.  Coughing and chest rattling, congestion all week, but not getting better. Cough is severe Took symbicort, after 1 hr woke up with feeling like difficult breathing but feels like coming from noise, like wheezing but louder noise.  Also feel like having pain on left side of abdomen.  Not sure if asthma this time.     Past Medical History:  Diagnosis Date  . Allergic rhinitis   . Anxiety   . Asthma   . HTN (hypertension)   . Pneumonia   . Shortness of breath dyspnea     Patient Active Problem List   Diagnosis Date Noted  . Tongue swelling 07/12/2015  . PNA (pneumonia) 01/27/2015  . Community acquired pneumonia 01/27/2015  . Reactive depression (situational) 10/05/2014  . Oral thrush 04/01/2014  . Pneumonia 07/25/2013  . Well adult exam 04/10/2013  . URI, acute 03/26/2012  . Bronchiectasis with retained secretions 11/10/2010  . COUGH 01/18/2010  . TACHYCARDIA 10/29/2008  . DYSPNEA 10/29/2008  . Chronic bronchitis NEC 07/12/2008  . CERUMEN IMPACTION 04/01/2008  . ALLERGIC RHINITIS 11/13/2007  . LEG PAIN 09/10/2007  . RASH AND OTHER NONSPECIFIC SKIN ERUPTION 09/10/2007  . HOARSENESS 09/10/2007  . CYSTITIS 05/09/2007  . ANXIETY 02/04/2007  . Essential hypertension 02/04/2007  . DEPRESSION 01/30/2007  . Asthma with bronchitis 11/27/2006    Past Surgical History:  Procedure Laterality Date  . TUBAL LIGATION      OB History    No data available       Home Medications    Prior to Admission medications   Medication Sig Start Date End Date Taking? Authorizing Provider  acetaminophen (TYLENOL) 325 MG  tablet Take 325 mg by mouth every 6 (six) hours as needed. For pain   Yes Historical Provider, MD  albuterol (PROAIR HFA) 108 (90 BASE) MCG/ACT inhaler Inhale 2 puffs into the lungs every 4 (four) hours as needed for wheezing or shortness of breath. For shortness of breath 08/30/14  Yes Jola Schmidt, MD  albuterol (PROVENTIL) (2.5 MG/3ML) 0.083% nebulizer solution Take 3 mLs (2.5 mg total) by nebulization every 4 (four) hours as needed for wheezing or shortness of breath. 10/05/14  Yes Deneise Lever, MD  Ascorbic Acid (VITAMIN C PO) Take 1 tablet by mouth daily.    Yes Historical Provider, MD  B Complex Vitamins (VITAMIN B COMPLEX PO) Take 1 tablet by mouth daily as needed (takes when she remembers to take it).    Yes Historical Provider, MD  budesonide-formoterol (SYMBICORT) 160-4.5 MCG/ACT inhaler Inhale 2 puffs into the lungs 2 (two) times daily. 03/28/15  Yes Deneise Lever, MD  cholecalciferol (VITAMIN D) 1000 UNITS tablet Take 1,000 Units by mouth daily.   Yes Historical Provider, MD  diltiazem (CARDIZEM) 120 MG tablet take 1 tablet by mouth twice a day 12/06/15  Yes Evie Lacks Plotnikov, MD  Guaifenesin 1200 MG TB12 Take 1 tablet (1,200 mg total) by mouth 2 (two) times daily. 03/26/15  Yes Christopher Lawyer, PA-C  loratadine (CLARITIN) 10 MG tablet Take 10 mg by mouth daily as needed  for allergies.  08/16/10  Yes Cassandria Anger, MD  Multiple Vitamin (MULTIVITAMIN) tablet Take 1 tablet by mouth daily.     Yes Historical Provider, MD  levofloxacin (LEVAQUIN) 500 MG tablet Take 1 tablet (500 mg total) by mouth daily. 12/09/15 12/16/15  Gareth Morgan, MD  predniSONE (DELTASONE) 10 MG tablet Take 4 tablets (40 mg total) by mouth daily. 12/09/15 12/13/15  Gareth Morgan, MD  promethazine-dextromethorphan (PROMETHAZINE-DM) 6.25-15 MG/5ML syrup Take 5 mLs by mouth 4 (four) times daily as needed for cough. 03/26/15   Dalia Heading, PA-C  Respiratory Therapy Supplies (FLUTTER) DEVI Blow through 4  times per set and repeat 3 sets per day, to loosen lung secretions. 07/16/14   Deneise Lever, MD    Family History Family History  Problem Relation Age of Onset  . Diabetes Father   . Asthma Father   . Allergies Father   . Mental illness Mother     alzheimer's  . Allergies Brother   . Allergies Brother   . Heart disease Maternal Aunt     Social History Social History  Substance Use Topics  . Smoking status: Never Smoker  . Smokeless tobacco: Never Used     Comment: father smoked, worked w/smokers  . Alcohol use No     Allergies   Penicillins; Shellfish allergy; Sulfonamide derivatives; Aspirin; Azithromycin; Bee venom; Clindamycin; Fluticasone-salmeterol; Fruit & vegetable daily [nutritional supplements]; Latex; Montelukast sodium; and Ventolin [albuterol]   Review of Systems Review of Systems  Constitutional: Negative for fever.  HENT: Positive for congestion (denies runny nose but notes congestion in back of throat). Negative for sore throat.   Eyes: Negative for visual disturbance.  Respiratory: Positive for cough, shortness of breath and wheezing.   Cardiovascular: Positive for chest pain. Negative for leg swelling.  Gastrointestinal: Positive for abdominal pain (left side). Negative for nausea and vomiting.  Genitourinary: Positive for flank pain. Negative for difficulty urinating.  Musculoskeletal: Negative for back pain and neck pain.  Skin: Negative for rash.  Neurological: Negative for syncope and headaches.     Physical Exam Updated Vital Signs BP 157/76   Pulse 77   Temp 97.8 F (36.6 C) (Oral)   Resp 23   Ht 5\' 2"  (1.575 m)   Wt 126 lb (57.2 kg)   SpO2 97%   BMI 23.05 kg/m   Physical Exam  Constitutional: She is oriented to person, place, and time. She appears well-developed and well-nourished. No distress.  HENT:  Head: Normocephalic and atraumatic.  Eyes: Conjunctivae and EOM are normal.  Neck: Normal range of motion. No JVD present.    Cardiovascular: Normal rate, regular rhythm, normal heart sounds and intact distal pulses.  Exam reveals no gallop and no friction rub.   No murmur heard. Pulmonary/Chest: Effort normal. No respiratory distress. She has wheezes (occasional). She has no rales.  Abdominal: Soft. She exhibits no distension. There is no tenderness. There is no guarding.  Musculoskeletal: She exhibits no edema or tenderness.  Neurological: She is alert and oriented to person, place, and time.  Skin: Skin is warm and dry. No rash noted. She is not diaphoretic. No erythema.  Nursing note and vitals reviewed.    ED Treatments / Results  Labs (all labs ordered are listed, but only abnormal results are displayed) Labs Reviewed  BASIC METABOLIC PANEL - Abnormal; Notable for the following:       Result Value   Potassium 3.4 (*)    Chloride 100 (*)  All other components within normal limits  CBC  I-STAT TROPOININ, ED  I-STAT TROPOININ, ED    EKG  EKG Interpretation  Date/Time:  Friday December 09 2015 02:26:30 EDT Ventricular Rate:  74 PR Interval:  162 QRS Duration: 100 QT Interval:  412 QTC Calculation: 457 R Axis:   -46 Text Interpretation:  Normal sinus rhythm Incomplete right bundle branch block Left anterior fascicular block Abnormal ECG No significant change since last tracing  Mar 26 2015 Confirmed by [facility] MD, Valen Mascaro ([id number]) on 12/09/2015 4:19:50 AM Also confirmed by [facility] MD, [place] ([id number]), editor [place], Leda Gauze [phone])  on 12/09/2015 7:18:33 AM       Radiology Dg Chest 2 View  Result Date: 12/09/2015 CLINICAL DATA:  Acute onset of right-sided chest pain and shortness of breath. Subacute onset of cough. Left upper quadrant abdominal pain. Initial encounter. EXAM: CHEST  2 VIEW COMPARISON:  Chest radiograph performed 03/26/2015 FINDINGS: The lungs are well-aerated. Mild left basilar opacity may reflect atelectasis or possibly mild infection. There is no evidence of pleural effusion or  pneumothorax. The heart is normal in size; the mediastinal contour is within normal limits. No acute osseous abnormalities are seen. IMPRESSION: Mild left basilar airspace opacity may reflect atelectasis or possibly mild infection. Electronically Signed   By: Garald Balding M.D.   On: 12/09/2015 03:29    Procedures Procedures (including critical care time)  Medications Ordered in ED Medications  ipratropium-albuterol (DUONEB) 0.5-2.5 (3) MG/3ML nebulizer solution 3 mL (3 mLs Nebulization Given 12/09/15 0531)  levofloxacin (LEVAQUIN) tablet 500 mg (500 mg Oral Given 12/09/15 0535)     Initial Impression / Assessment and Plan / ED Course  I have reviewed the triage vital signs and the nursing notes.  Pertinent labs & imaging results that were available during my care of the patient were reviewed by me and considered in my medical decision making (see chart for details).  Clinical Course   75 year old female with a history of hypertension, asthma, bronchiectasis and presents with concern for cough for 1 week, right-sided chest pain x-ray concerning for possible pneumonia. Feel history and exam are overall consistent with this. No pulmonary edema, no leg swelling, have low suspicion for CHF. Patient with occasional scattered wheezing on exam. Patient describes right-sided chest pain, and by history of cough, and pain with palpation, suspect most likely musculoskeletal etiology of pain. EKG shows no significant abnormalities, and delta troponins are negative. Patient with normal oxygenation on room air, is well-appearing, and is appropriate for outpatient treatment of pneumonia. She is given a prescription for Levaquin 500 mg to take daily for 7 days. In addition she was given prednisone 40 mg prescription to take for 4 days patient feels improved after DuoNeb in the emergency department.   Final Clinical Impressions(s) / ED Diagnoses   Final diagnoses:  Community acquired pneumonia  Asthma,  unspecified asthma severity, with acute exacerbation    New Prescriptions Discharge Medication List as of 12/09/2015  7:24 AM    START taking these medications   Details  levofloxacin (LEVAQUIN) 500 MG tablet Take 1 tablet (500 mg total) by mouth daily., Starting Fri 12/09/2015, Until Fri 12/16/2015, Print         Gareth Morgan, MD 12/09/15 [phone]

## 2015-12-23 DIAGNOSIS — J410 Simple chronic bronchitis: Secondary | ICD-10-CM | POA: Diagnosis not present

## 2016-01-17 ENCOUNTER — Encounter: Payer: Self-pay | Admitting: Internal Medicine

## 2016-01-17 ENCOUNTER — Ambulatory Visit (INDEPENDENT_AMBULATORY_CARE_PROVIDER_SITE_OTHER): Payer: Medicare Other | Admitting: Internal Medicine

## 2016-01-17 VITALS — BP 126/72 | HR 93 | Ht 62.0 in | Wt 130.8 lb

## 2016-01-17 DIAGNOSIS — J189 Pneumonia, unspecified organism: Secondary | ICD-10-CM | POA: Diagnosis not present

## 2016-01-17 DIAGNOSIS — J479 Bronchiectasis, uncomplicated: Secondary | ICD-10-CM

## 2016-01-17 MED ORDER — PROMETHAZINE-CODEINE 6.25-10 MG/5ML PO SYRP: 5.0000 mL | ORAL_SOLUTION | Freq: Four times a day (QID) | ORAL | 0 refills | Status: DC | PRN

## 2016-01-17 MED ORDER — PREDNISONE 10 MG PO TABS
ORAL_TABLET | ORAL | 0 refills | Status: DC
Start: 2016-01-17 — End: 2016-03-05

## 2016-01-17 MED ORDER — CEFDINIR 300 MG PO CAPS: 300.0000 mg | ORAL_CAPSULE | Freq: Two times a day (BID) | ORAL | 0 refills | Status: DC

## 2016-01-17 NOTE — Progress Notes (Signed)
Subjective:    Patient ID: [name], female    DOB: 30-Aug-1940, 75 y.o.   MRN: [id number]  HPI   01/26/15- 13 yoF never smoker with hx of asthma/ bronchiectasis, retained secretions, left hilar mass/cyst PET neg in 2008/ then progressive, RML atelectasis. PET neg 2012  Acute visit:  tougue swelling started after taking Doxycyline RX. Pt has non productive couhg-gags instead. No fevers as well. We gave Avelox to hold at last ov which she says she did not take. Called Rx doxy 9/26, but she called today saying swollen tongue, malaise, cough, sob after 2 doses of doxycycline. She has complained of swollen tongue with multiple medications. ComplaiFOLLOWS FOR: Pt states she is taking her meds correctly and eating right since last visit and feeling well. Only had to call in 1 time for Prednisone and no other ED visits since 03-2015 Pt noted tongue swelling every morning recently.ns of "no energy" may be chills but no fever. Feels she has to keep her oxygen continuously. Nonproductive cough. Using nebulizer every 4 hours because she feels she needs it but relief is temporary.  07/12/2015-75 year old female never smoker followed for asthma/bronchiectasis, retained secretions, left hilar mass/cyst PET negative in 2008/then progressive RML atelectasis, PET neg 2012 FOLLOWS FOR: Pt states she is taking her meds correctly and eating right since last visit and feeling well. Only had to call in 1 time for Prednisone and no other ED visits since 03-2015 Pt noted tongue swelling every morning recently. ER visit for asthma 03/26/2015 with office follow-up 03/28/2015. Pulmonary Rehabilitation. No respiratory problems since exacerbation of asthma/bronchitis with ER visit in November. She is using flutter regularly and feels this prevents the accumulation of airway mucus. Also using Symbicort. Has a feeling of tongue swelling many mornings on waking over the last month. Has taken Benadryl a couple of times which  seemed to help, but the feeling goes away anyway when she is up and around. No skin rash. No ACE inhibitors. CXR 03/26/2015 IMPRESSION: No acute cardiopulmonary disease. Electronically Signed  By: Marin Olp M.D.  On: 03/26/2015 08:28  01/17/2016-75 year old female never smoker followed for asthma/bronchiectasis, retained secretions with cystic retention/mass effect, recurrent atelectasis. Negative workup with bronchoscopies and PET scans 2012. FOLLOWS FOR: does report some increased DOE, PND, some prod cough with light yellow mucus with the weather change.  denies any wheezing, tightness, hemoptysis, chest pain.  requesting refill on Promethazine-DM cough syrup and prednisone ED visit 12/09/2015 treated for CAP with Levaquin Increasing cough again. Productive thick brownish clear without fever or chest pain. Some increased dyspnea on exertion with ADLs. Flutter has helped some but does feel that she is "clogging up" again in her chest. She hits herself on her back with a brush, trying to help her self cough. CXR 12/09/2015 IMPRESSION: Mild left basilar airspace opacity may reflect atelectasis or possibly mild infection. Electronically Signed   By: Garald Balding M.D.   On: 12/09/2015 03:29  ROS-see HPI Constitutional:   No-   weight loss, night sweats, fevers,  chills, fatigue, lassitude. HEENT:   No-  headaches, difficulty swallowing, tooth/dental problems, sore throat,       No-  sneezing, itching, ear ache, nasal congestion, post nasal drip,  CV:  No-   chest pain, +orthopnea, no-PND, swelling in lower extremities, anasarca,  dizziness, palpitations Resp: +   shortness of breath with exertion or at rest.             + productive cough,  + non-productive  cough,  No- coughing up of blood.              +change in color of mucus. +wheezing.   Skin: No-   rash or lesions. GI:  No-   heartburn, indigestion, abdominal pain, nausea, vomiting,  GU: . MS:  No-   joint pain or  swelling.   Neuro-     nothing unusual Psych:  No- change in mood or affect. No depression or anxiety.  No memory loss.  OBJ- Physical Exam General- Alert, Oriented, Affect-appropriate, Distress- none acute. Thin Skin- rash-none, lesions- none, excoriation- none   Lymphadenopathy- none Head- atraumatic            Eyes- Gross vision intact, PERRLA, conjunctivae and secretions clear            Ears- +somewhat hard of hearing            Nose- Clear, no-Septal dev, mucus, polyps, erosion, perforation             Throat- Mallampati II , mucosa + slightly coated, drainage- none, tonsils- atrophic, tongue is not swollen Neck- flexible , trachea midline, no stridor , thyroid nl, carotid no bruit Chest - symmetrical excursion , unlabored           Heart/CV- RRR , no murmur , no gallop  , no rub, nl s1 s2                           - JVD- none , edema- none, stasis changes- none, varices- none           Lung-  wheeze -none ,  cough + rattling, dullness-none, rub- none, breathing unlabored, + distant           Chest wall-  Abd- Br/ Gen/ Rectal- Not done, not indicated Extrem- cyanosis- none, clubbing, none, atrophy- none, strength- wheelchair  Neuro- grossly intact to observation

## 2016-01-17 NOTE — Assessment & Plan Note (Signed)
Another exacerbation with progressive secretion retention  She is going to need more help with pulmonary toilet. With long hx of productive cough, antibiotics and simpler airway clearance methods, it is time to try a pneumatic vest. Plan- pneumatic vest, cefdinir, prednisone taper, continue to use Flutter.

## 2016-01-17 NOTE — Assessment & Plan Note (Signed)
Required levaquin. Sputum still brown. Will use cefdinir.

## 2016-01-17 NOTE — Patient Instructions (Signed)
Script sent for prednisone taper and for cefdinir antibiotic  Script printed for cough syrup  Order- [place] pneumatic pressure VEST for clearing pulmonary secretions    Dx bronchiectasis with exacerbation

## 2016-01-18 ENCOUNTER — Other Ambulatory Visit: Payer: Self-pay | Admitting: Internal Medicine

## 2016-01-23 DIAGNOSIS — J410 Simple chronic bronchitis: Secondary | ICD-10-CM | POA: Diagnosis not present

## 2016-01-26 ENCOUNTER — Encounter: Payer: Self-pay | Admitting: Internal Medicine

## 2016-02-03 DIAGNOSIS — J410 Simple chronic bronchitis: Secondary | ICD-10-CM | POA: Diagnosis not present

## 2016-02-03 DIAGNOSIS — J471 Bronchiectasis with (acute) exacerbation: Secondary | ICD-10-CM | POA: Diagnosis not present

## 2016-02-14 ENCOUNTER — Ambulatory Visit (INDEPENDENT_AMBULATORY_CARE_PROVIDER_SITE_OTHER): Payer: Medicare Other | Admitting: Family

## 2016-02-14 ENCOUNTER — Encounter: Payer: Self-pay | Admitting: Family

## 2016-02-14 DIAGNOSIS — K2100 Gastro-esophageal reflux disease with esophagitis, without bleeding: Secondary | ICD-10-CM

## 2016-02-14 DIAGNOSIS — R05 Cough: Secondary | ICD-10-CM

## 2016-02-14 DIAGNOSIS — R059 Cough, unspecified: Secondary | ICD-10-CM

## 2016-02-14 DIAGNOSIS — K219 Gastro-esophageal reflux disease without esophagitis: Secondary | ICD-10-CM | POA: Insufficient documentation

## 2016-02-14 MED ORDER — OMEPRAZOLE 20 MG PO CPDR: 20.0000 mg | DELAYED_RELEASE_CAPSULE | Freq: Every day | ORAL | 3 refills | Status: DC

## 2016-02-14 NOTE — Progress Notes (Signed)
Subjective:    Patient ID: [name], female    DOB: June 28, 1940, 75 y.o.   MRN: [id number]  Chief Complaint  Patient presents with  . Cough    x2 weeks, productive cough and hoarse     HPI:  [name] is a 75 y.o. female who  has a past medical history of Allergic rhinitis; Anxiety; Asthma; HTN (hypertension); Pneumonia; and Shortness of breath dyspnea. and presents today for an acute office visit.   This is a new problem. Associated symptom of productive cough, sore throat, and congestion have been going on for about 2 weeks. Possible subjective fever. Modifying factors include promethazine with codeine which does help with her symptoms. Course of the symptoms are gradually improving. Recently completed a course of cefdinir. Has been experiencing increased symptoms belching and reflux. Severity of the cough does keep her up at night and worsened when lying down.   Allergies  Allergen Reactions  . Penicillins Swelling  . Shellfish Allergy Anaphylaxis  . Sulfonamide Derivatives Swelling    Throat swelling  . Aspirin Other (See Comments)    States stomach bubbles, becomes gaseous and irritated  . Azithromycin     "makes me gag"  . Bee Venom   . Clindamycin     REACTION: Neck, tongue swelling, SOB \\T \ rash  . Fluticasone-Salmeterol     REACTION: hoarseness  . Fruit & Vegetable Daily [Nutritional Supplements]     Tongue swelling, vomiting  . Latex Itching and Swelling  . Montelukast Sodium     REACTION: hallucination  . Ventolin [Albuterol]     cough      Outpatient Medications Prior to Visit  Medication Sig Dispense Refill  . acetaminophen (TYLENOL) 325 MG tablet Take 325 mg by mouth every 6 (six) hours as needed. For pain    . albuterol (PROAIR HFA) 108 (90 BASE) MCG/ACT inhaler Inhale 2 puffs into the lungs every 4 (four) hours as needed for wheezing or shortness of breath. For shortness of breath 1 Inhaler 0  . albuterol (PROVENTIL) (2.5 MG/3ML) 0.083% nebulizer  solution Take 3 mLs (2.5 mg total) by nebulization every 4 (four) hours as needed for wheezing or shortness of breath. 75 mL 5  . Ascorbic Acid (VITAMIN C PO) Take 1 tablet by mouth daily.     . B Complex Vitamins (VITAMIN B COMPLEX PO) Take 1 tablet by mouth daily as needed (takes when she remembers to take it).     . cefdinir (OMNICEF) 300 MG capsule Take 1 capsule (300 mg total) by mouth 2 (two) times daily. 14 capsule 0  . cholecalciferol (VITAMIN D) 1000 UNITS tablet Take 1,000 Units by mouth daily.    Marland Kitchen diltiazem (CARDIZEM) 120 MG tablet take 1 tablet by mouth twice a day 60 tablet 11  . Guaifenesin 1200 MG TB12 Take 1 tablet (1,200 mg total) by mouth 2 (two) times daily. 20 each 0  . loratadine (CLARITIN) 10 MG tablet Take 10 mg by mouth daily as needed for allergies.     . Multiple Vitamin (MULTIVITAMIN) tablet Take 1 tablet by mouth daily.      . predniSONE (DELTASONE) 10 MG tablet 4 X 2 DAYS, 3 X 2 DAYS, 2 X 2 DAYS, 1 X 2 DAYS 20 tablet 0  . promethazine-codeine (PHENERGAN WITH CODEINE) 6.25-10 MG/5ML syrup Take 5 mLs by mouth every 6 (six) hours as needed for cough. 200 mL 0  . promethazine-dextromethorphan (PROMETHAZINE-DM) 6.25-15 MG/5ML syrup Take 5 mLs by  mouth 4 (four) times daily as needed for cough. 120 mL 0  . Respiratory Therapy Supplies (FLUTTER) DEVI Blow through 4 times per set and repeat 3 sets per day, to loosen lung secretions. 1 each 0  . SYMBICORT 160-4.5 MCG/ACT inhaler inhale 2 puffs by mouth twice a day 10.2 g 5   No facility-administered medications prior to visit.       Past Surgical History:  Procedure Laterality Date  . TUBAL LIGATION        Past Medical History:  Diagnosis Date  . Allergic rhinitis   . Anxiety   . Asthma   . HTN (hypertension)   . Pneumonia   . Shortness of breath dyspnea       Review of Systems  Constitutional: Negative for chills and fever.  HENT: Positive for congestion, sore throat and voice change.   Respiratory:  Positive for cough and wheezing.   Neurological: Negative for headaches.      Objective:    BP 134/88 (BP Location: Left Arm, Patient Position: Sitting, Cuff Size: Normal)   Pulse 77   Temp 97.9 F (36.6 C) (Oral)   Resp 16   Ht 5\' 2"  (1.575 m)   Wt 135 lb 1.9 oz (61.3 kg)   SpO2 94%   BMI 24.71 kg/m  Nursing note and vital signs reviewed.  Physical Exam  Constitutional: She is oriented to person, place, and time. She appears well-developed and well-nourished. No distress.  HENT:  Right Ear: Hearing, tympanic membrane, external ear and ear canal normal.  Left Ear: Hearing, tympanic membrane, external ear and ear canal normal.  Nose: Nose normal. Right sinus exhibits no maxillary sinus tenderness and no frontal sinus tenderness. Left sinus exhibits no maxillary sinus tenderness and no frontal sinus tenderness.  Mouth/Throat: Uvula is midline, oropharynx is clear and moist and mucous membranes are normal.  Cardiovascular: Normal rate, regular rhythm, normal heart sounds and intact distal pulses.   Pulmonary/Chest: Effort normal. She has wheezes. She has no rales.  Neurological: She is alert and oriented to person, place, and time.  Skin: Skin is warm and dry.  Psychiatric: She has a normal mood and affect. Her behavior is normal. Judgment and thought content normal.       Assessment & Plan:   Problem List Items Addressed This Visit      Digestive   GERD (gastroesophageal reflux disease)    Symptoms and exam consistent with gastroesophageal reflux most likely contributing factor to hoarseness and cough in the setting of recent bronchiectasis. Start omeprazole. Information on food choices to reduce symptoms provided and after visit summary. Follow-up if symptoms worsen or do not improve.      Relevant Medications   omeprazole (PRILOSEC) 20 MG capsule     Other   COUGH    Cough does not appear to be infectious in nature with previous completion of Omnicef for bronchiectasis.  Continue current dosage of albuterol. Start sample of Breo. Continue over-the-counter medications as needed for symptom relief and supportive care. Continue previously prescribed dosage of promethazine and codeine. Follow-up if symptoms worsen or fail to improve.       Other Visit Diagnoses   None.      I am having Ms. Dorner start on omeprazole. I am also having her maintain her acetaminophen, multivitamin, loratadine, B Complex Vitamins (VITAMIN B COMPLEX PO), Ascorbic Acid (VITAMIN C PO), cholecalciferol, FLUTTER, albuterol, albuterol, Guaifenesin, promethazine-dextromethorphan, diltiazem, cefdinir, predniSONE, promethazine-codeine, and SYMBICORT.   Meds ordered this encounter  Medications  . omeprazole (PRILOSEC) 20 MG capsule    Sig: Take 1 capsule (20 mg total) by mouth daily.    Dispense:  30 capsule    Refill:  3    Order Specific Question:   Supervising Provider    Answer:   Pricilla Holm A [id number]     Follow-up: Return if symptoms worsen or fail to improve.  Mauricio Po, FNP

## 2016-02-14 NOTE — Assessment & Plan Note (Signed)
Symptoms and exam consistent with gastroesophageal reflux most likely contributing factor to hoarseness and cough in the setting of recent bronchiectasis. Start omeprazole. Information on food choices to reduce symptoms provided and after visit summary. Follow-up if symptoms worsen or do not improve.

## 2016-02-14 NOTE — Assessment & Plan Note (Signed)
Cough does not appear to be infectious in nature with previous completion of Omnicef for bronchiectasis. Continue current dosage of albuterol. Start sample of Breo. Continue over-the-counter medications as needed for symptom relief and supportive care. Continue previously prescribed dosage of promethazine and codeine. Follow-up if symptoms worsen or fail to improve.

## 2016-02-14 NOTE — Patient Instructions (Signed)
Thank you for choosing [facility].  SUMMARY AND INSTRUCTIONS:  Please start the omeprazole for the reflux symptoms.   Start the Marcus daily for wheezing and continue albuterol as needed.  Follow up if your symptoms do not improve.   Medication:  Your prescription(s) have been submitted to your pharmacy or been printed and provided for you. Please take as directed and contact our office if you believe you are having problem(s) with the medication(s) or have any questions.  Follow up:  If your symptoms worsen or fail to improve, please contact our office for further instruction, or in case of emergency go directly to the emergency room at the closest medical facility.   General Recommendations:    Please drink plenty of fluids.  Get plenty of rest   Sleep in humidified air  Use saline nasal sprays  Netti pot   OTC Medications:  Decongestants - helps relieve congestion   Flonase (generic fluticasone) or Nasacort (generic triamcinolone) - please make sure to use the "cross-over" technique at a 45 degree angle towards the opposite eye as opposed to straight up the nasal passageway.   Sudafed (generic pseudoephedrine - Note this is the one that is available behind the pharmacy counter); Products with phenylephrine (-PE) may also be used but is often not as effective as pseudoephedrine.   If you have HIGH BLOOD PRESSURE - Coricidin HBP; AVOID any product that is -D as this contains pseudoephedrine which may increase your blood pressure.  Afrin (oxymetazoline) every 6-8 hours for up to 3 days.   Allergies - helps relieve runny nose, itchy eyes and sneezing   Claritin (generic loratidine), Allegra (fexofenidine), or Zyrtec (generic cyrterizine) for runny nose. These medications should not cause drowsiness.  Note - Benadryl (generic diphenhydramine) may be used however may cause drowsiness  Cough -   Delsym or Robitussin (generic dextromethorphan)  Expectorants -  helps loosen mucus to ease removal   Mucinex (generic guaifenesin) as directed on the package.  Headaches / General Aches   Tylenol (generic acetaminophen) - DO NOT EXCEED 3 grams (3,000 mg) in a 24 hour time period  Advil/Motrin (generic ibuprofen)   Sore Throat -   Salt water gargle   Chloraseptic (generic benzocaine) spray or lozenges / Sucrets (generic dyclonine)

## 2016-02-22 DIAGNOSIS — J410 Simple chronic bronchitis: Secondary | ICD-10-CM | POA: Diagnosis not present

## 2016-03-05 ENCOUNTER — Ambulatory Visit (INDEPENDENT_AMBULATORY_CARE_PROVIDER_SITE_OTHER)
Admission: RE | Admit: 2016-03-05 | Discharge: 2016-03-05 | Disposition: A | Discharge status: Home / Self Care | Payer: Medicare Other | Source: Ambulatory Visit | Attending: Internal Medicine | Admitting: Internal Medicine

## 2016-03-05 ENCOUNTER — Encounter: Payer: Self-pay | Admitting: Internal Medicine

## 2016-03-05 ENCOUNTER — Telehealth: Payer: Self-pay | Admitting: Internal Medicine

## 2016-03-05 ENCOUNTER — Ambulatory Visit (INDEPENDENT_AMBULATORY_CARE_PROVIDER_SITE_OTHER): Payer: Medicare Other | Admitting: Internal Medicine

## 2016-03-05 VITALS — BP 140/80 | HR 89 | Temp 98.1°F | Resp 20 | Wt 132.0 lb

## 2016-03-05 DIAGNOSIS — J410 Simple chronic bronchitis: Secondary | ICD-10-CM | POA: Diagnosis not present

## 2016-03-05 DIAGNOSIS — I10 Essential (primary) hypertension: Secondary | ICD-10-CM

## 2016-03-05 DIAGNOSIS — R062 Wheezing: Secondary | ICD-10-CM | POA: Diagnosis not present

## 2016-03-05 DIAGNOSIS — R05 Cough: Secondary | ICD-10-CM | POA: Diagnosis not present

## 2016-03-05 DIAGNOSIS — R059 Cough, unspecified: Secondary | ICD-10-CM

## 2016-03-05 DIAGNOSIS — J471 Bronchiectasis with (acute) exacerbation: Secondary | ICD-10-CM | POA: Diagnosis not present

## 2016-03-05 MED ORDER — LEVOFLOXACIN 250 MG PO TABS: 250.0000 mg | ORAL_TABLET | Freq: Every day | ORAL | 0 refills | Status: DC

## 2016-03-05 MED ORDER — PROMETHAZINE-CODEINE 6.25-10 MG/5ML PO SYRP: 5.0000 mL | ORAL_SOLUTION | Freq: Four times a day (QID) | ORAL | 0 refills | Status: DC | PRN

## 2016-03-05 MED ORDER — ALBUTEROL SULFATE HFA 108 (90 BASE) MCG/ACT IN AERS: 2.0000 | INHALATION_SPRAY | RESPIRATORY_TRACT | 11 refills | Status: AC | PRN

## 2016-03-05 MED ORDER — ALBUTEROL SULFATE (2.5 MG/3ML) 0.083% IN NEBU
2.5000 mg | INHALATION_SOLUTION | RESPIRATORY_TRACT | 5 refills | Status: DC | PRN
Start: 2016-03-05 — End: 2017-04-25

## 2016-03-05 MED ORDER — PREDNISONE 10 MG PO TABS
ORAL_TABLET | ORAL | 0 refills | Status: DC
Start: 2016-03-05 — End: 2016-03-29

## 2016-03-05 NOTE — Progress Notes (Signed)
Subjective:    Patient ID: [name], female    DOB: 27-May-1940, 75 y.o.   MRN: [id number]  HPI  Pt here with acute onset 2 days worsening general weakness and malaise, with prod cough greenish sputum, sob and mild wheezes but Pt denies chest pain, orthopnea, PND, increased LE swelling, palpitations, dizziness or syncope.  Has hx of similar in feb 2017 with possible LLL infiltrate by cxr, but this time some worse.  Also noted hx per Dr Annamaria Boots: CT 10/02/06- stable confluent soft tissue LLL c/w scarring. PET 05/22/06 neg. CT -11/03/10- Progression L hilar mass 2.6 x 2.2 cm w/ post obstructive LLL density. New RML collapse. Scattered bronchiectasis. Bronch 11/30/10- benign material lymphocytes and eos, with necrotic appearing mass occluding LLL.   PET 2012 NEG CT 12/21/11- improvement with decreased size of left infrahilar lesion, now air-filled; reexpansion of right middle lobe CT 06/26/12- possible mycetoma left lower lobe cavity. Picture favors chronic aspiration. Office spirometry 07/08/2014 Pre and postbronchodilator: Very severe obstructive airways disease with severe restriction of FVC and insignificant response to bronchodilator. FVC 0.90/41%, FEV1 0.44/27%, FEV1/FVC 0.49 Past Medical History:  Diagnosis Date  . Allergic rhinitis   . Anxiety   . Asthma   . HTN (hypertension)   . Pneumonia   . Shortness of breath dyspnea    Past Surgical History:  Procedure Laterality Date  . TUBAL LIGATION      reports that she has never smoked. She has never used smokeless tobacco. She reports that she does not drink alcohol or use drugs. family history includes Allergies in her brother, brother, and father; Asthma in her father; Diabetes in her father; Heart disease in her maternal aunt; Mental illness in her mother. Allergies  Allergen Reactions  . Penicillins Swelling  . Shellfish Allergy Anaphylaxis  . Sulfonamide Derivatives Swelling    Throat swelling  . Aspirin Other (See Comments)    States  stomach bubbles, becomes gaseous and irritated  . Azithromycin     "makes me gag"  . Bee Venom   . Clindamycin     REACTION: Neck, tongue swelling, SOB \\T \ rash  . Fluticasone-Salmeterol     REACTION: hoarseness  . Fruit & Vegetable Daily [Nutritional Supplements]     Tongue swelling, vomiting  . Latex Itching and Swelling  . Montelukast Sodium     REACTION: hallucination  . Ventolin [Albuterol]     cough   Current Outpatient Prescriptions on File Prior to Visit  Medication Sig Dispense Refill  . acetaminophen (TYLENOL) 325 MG tablet Take 325 mg by mouth every 6 (six) hours as needed. For pain    . Ascorbic Acid (VITAMIN C PO) Take 1 tablet by mouth daily.     . B Complex Vitamins (VITAMIN B COMPLEX PO) Take 1 tablet by mouth daily as needed (takes when she remembers to take it).     . cefdinir (OMNICEF) 300 MG capsule Take 1 capsule (300 mg total) by mouth 2 (two) times daily. 14 capsule 0  . cholecalciferol (VITAMIN D) 1000 UNITS tablet Take 1,000 Units by mouth daily.    Marland Kitchen diltiazem (CARDIZEM) 120 MG tablet take 1 tablet by mouth twice a day 60 tablet 11  . Guaifenesin 1200 MG TB12 Take 1 tablet (1,200 mg total) by mouth 2 (two) times daily. 20 each 0  . loratadine (CLARITIN) 10 MG tablet Take 10 mg by mouth daily as needed for allergies.     . Multiple Vitamin (MULTIVITAMIN) tablet Take 1  tablet by mouth daily.      Marland Kitchen omeprazole (PRILOSEC) 20 MG capsule Take 1 capsule (20 mg total) by mouth daily. 30 capsule 3  . Respiratory Therapy Supplies (FLUTTER) DEVI Blow through 4 times per set and repeat 3 sets per day, to loosen lung secretions. 1 each 0  . SYMBICORT 160-4.5 MCG/ACT inhaler inhale 2 puffs by mouth twice a day 10.2 g 5   No current facility-administered medications on file prior to visit.    Review of Systems  Constitutional: Negative for unusual diaphoresis or night sweats HENT: Negative for ear swelling or discharge Eyes: Negative for worsening visual haziness    Respiratory: Negative for choking and stridor.   Gastrointestinal: Negative for distension or worsening eructation Genitourinary: Negative for retention or change in urine volume.  Musculoskeletal: Negative for other MSK pain or swelling Skin: Negative for color change and worsening wound Neurological: Negative for tremors and numbness other than noted  Psychiatric/Behavioral: Negative for decreased concentration or agitation other than above   All other system neg per pt    Objective:   Physical Exam BP 140/80   Pulse 89   Temp 98.1 F (36.7 C) (Oral)   Resp 20   Wt 132 lb (59.9 kg)   SpO2 94%   BMI 24.14 kg/m  VS noted, mild ill, non toxic Constitutional: Pt appears in no apparent distress HENT: Head: NCAT.  Right Ear: External ear normal.  Left Ear: External ear normal.  Eyes: . Pupils are equal, round, and reactive to light. Conjunctivae and EOM are normal Neck: Normal range of motion. Neck supple.  Cardiovascular: Normal rate and regular rhythm.   Pulmonary/Chest: Effort normal and breath sounds Few LLL crackles, bilat mild few insp wheeze and several rhonchi, no accessory muscle use Neurological: Pt is alert. Not confused , motor grossly intact Skin: Skin is warm. No rash, no LE edema Psychiatric: Pt behavior is normal. No agitation.   Last CXR: CHEST  2 VIEW 12/09/2015 IMPRESSION: Mild left basilar airspace opacity may reflect atelectasis or possibly mild infection.    Assessment & Plan:

## 2016-03-05 NOTE — Progress Notes (Signed)
Pre visit review using our clinic review tool, if applicable. No additional management support is needed unless otherwise documented below in the visit note. 

## 2016-03-05 NOTE — Telephone Encounter (Signed)
Got appt rescheduled for patient that was cancelled through automated system

## 2016-03-05 NOTE — Patient Instructions (Signed)
You had the steroid shot today  Please take all new medication as prescribed - the antibiotic, cough medicine, and prednisone  Please continue all other medications as before, including the breathing medications  Please have the pharmacy call with any other refills you may need.  Please keep your appointments with your specialists as you may have planned  Please go to the XRAY Department in the Basement (go straight as you get off the elevator) for the x-ray testing  You will be contacted by phone if any changes need to be made immediately.  Otherwise, you will receive a letter about your results with an explanation, but please check with MyChart first.  Please remember to sign up for MyChart if you have not done so, as this will be important to you in the future with finding out test results, communicating by private email, and scheduling acute appointments online when needed.

## 2016-03-06 ENCOUNTER — Encounter: Payer: Medicare Other | Admitting: Internal Medicine

## 2016-03-06 ENCOUNTER — Encounter: Payer: Self-pay | Admitting: Internal Medicine

## 2016-03-06 NOTE — Assessment & Plan Note (Signed)
C/w most likely copd exacerbation, doubt volume overload, for depomedrol IM, predpac asd,  to f/u any worsening symptoms or concerns

## 2016-03-06 NOTE — Assessment & Plan Note (Addendum)
Mild to mod, c/w bronchitis vs pna, for antibx course, cxr - r/o PNA, to f/u any worsening symptoms or concerns, with complicated hx would favor at least f/u with PCP within a few days vs Pulm

## 2016-03-06 NOTE — Assessment & Plan Note (Signed)
stable overall by history and exam, recent data reviewed with pt, and pt to continue medical treatment as before,  to f/u any worsening symptoms or concerns BP Readings from Last 3 Encounters:  03/05/16 140/80  02/14/16 134/88  01/17/16 126/72

## 2016-03-08 ENCOUNTER — Encounter: Payer: Self-pay | Admitting: Nurse Practitioner

## 2016-03-08 ENCOUNTER — Ambulatory Visit (INDEPENDENT_AMBULATORY_CARE_PROVIDER_SITE_OTHER): Payer: Medicare Other | Admitting: Nurse Practitioner

## 2016-03-08 VITALS — BP 142/82 | HR 72 | Temp 98.6°F | Ht 62.0 in | Wt 131.0 lb

## 2016-03-08 DIAGNOSIS — J181 Lobar pneumonia, unspecified organism: Principal | ICD-10-CM

## 2016-03-08 DIAGNOSIS — J45909 Unspecified asthma, uncomplicated: Secondary | ICD-10-CM

## 2016-03-08 DIAGNOSIS — J189 Pneumonia, unspecified organism: Secondary | ICD-10-CM | POA: Diagnosis not present

## 2016-03-08 MED ORDER — LEVOFLOXACIN 750 MG PO TABS: 750.0000 mg | ORAL_TABLET | Freq: Every day | ORAL | 0 refills | Status: AC

## 2016-03-08 MED ORDER — IPRATROPIUM-ALBUTEROL 0.5-2.5 (3) MG/3ML IN SOLN
3.0000 mL | Freq: Once | RESPIRATORY_TRACT | Status: AC
  Administered 2016-03-08: 3 mL via RESPIRATORY_TRACT

## 2016-03-08 NOTE — Progress Notes (Signed)
Subjective:  Patient ID: [name], female    DOB: 05/18/1940  Age: 75 y.o. MRN: [id number]  CC: Pneumonia (Pt stated doing much better but still couhing and fever at night.)  HPI  Pneumonia f/up: Taking levaquin 250mg  as prescribed. Does not use pulmonary vest as instructed. Treated with oral abx (azithromycin, cefdinir and levaquin) and oral prednisone 3times in last 65months. Reports some improvement in symptoms.  No fever.  Outpatient Medications Prior to Visit  Medication Sig Dispense Refill  . acetaminophen (TYLENOL) 325 MG tablet Take 325 mg by mouth every 6 (six) hours as needed. For pain    . albuterol (PROAIR HFA) 108 (90 Base) MCG/ACT inhaler Inhale 2 puffs into the lungs every 4 (four) hours as needed for wheezing or shortness of breath. For shortness of breath 1 Inhaler 11  . albuterol (PROVENTIL) (2.5 MG/3ML) 0.083% nebulizer solution Take 3 mLs (2.5 mg total) by nebulization every 4 (four) hours as needed for wheezing or shortness of breath. 75 mL 5  . Ascorbic Acid (VITAMIN C PO) Take 1 tablet by mouth daily.     . B Complex Vitamins (VITAMIN B COMPLEX PO) Take 1 tablet by mouth daily as needed (takes when she remembers to take it).     . cholecalciferol (VITAMIN D) 1000 UNITS tablet Take 1,000 Units by mouth daily.    Marland Kitchen diltiazem (CARDIZEM) 120 MG tablet take 1 tablet by mouth twice a day 60 tablet 11  . Guaifenesin 1200 MG TB12 Take 1 tablet (1,200 mg total) by mouth 2 (two) times daily. 20 each 0  . loratadine (CLARITIN) 10 MG tablet Take 10 mg by mouth daily as needed for allergies.     . Multiple Vitamin (MULTIVITAMIN) tablet Take 1 tablet by mouth daily.      Marland Kitchen omeprazole (PRILOSEC) 20 MG capsule Take 1 capsule (20 mg total) by mouth daily. 30 capsule 3  . predniSONE (DELTASONE) 10 MG tablet 3 tabs by mouth per day for 3 days,2tabs per day for 3 days,1tab per day for 3 days 18 tablet 0  . promethazine-codeine (PHENERGAN WITH CODEINE) 6.25-10 MG/5ML syrup Take 5  mLs by mouth every 6 (six) hours as needed for cough. 200 mL 0  . Respiratory Therapy Supplies (FLUTTER) DEVI Blow through 4 times per set and repeat 3 sets per day, to loosen lung secretions. 1 each 0  . SYMBICORT 160-4.5 MCG/ACT inhaler inhale 2 puffs by mouth twice a day 10.2 g 5  . cefdinir (OMNICEF) 300 MG capsule Take 1 capsule (300 mg total) by mouth 2 (two) times daily. 14 capsule 0  . levofloxacin (LEVAQUIN) 250 MG tablet Take 1 tablet (250 mg total) by mouth daily. 10 tablet 0   No facility-administered medications prior to visit.     ROS See HPI  Objective:  BP (!) 142/82 (BP Location: Left Arm, Patient Position: Sitting, Cuff Size: Normal)   Pulse 72   Temp 98.6 F (37 C)   Ht 5\' 2"  (1.575 m)   Wt 131 lb (59.4 kg)   SpO2 96%   BMI 23.96 kg/m   BP Readings from Last 3 Encounters:  03/08/16 (!) 142/82  03/05/16 140/80  02/14/16 134/88    Wt Readings from Last 3 Encounters:  03/08/16 131 lb (59.4 kg)  03/05/16 132 lb (59.9 kg)  02/14/16 135 lb 1.9 oz (61.3 kg)    Physical Exam  Constitutional: She is oriented to person, place, and time.  Neck: Normal range of motion.  Neck supple.  Cardiovascular: Normal rate and normal heart sounds.   Pulmonary/Chest: Effort normal. No respiratory distress. She has wheezes. She has rales. She exhibits no tenderness.  Diminished lung sounds in bilateral lung bases with rales and exp. wheezes  Abdominal: Soft.  Musculoskeletal: She exhibits no edema.  Neurological: She is alert and oriented to person, place, and time.  no improvement of lung sounds after nebulizer treatment.  Lab Results  Component Value Date   WBC 8.3 12/09/2015   HGB 13.6 12/09/2015   HCT 43.3 12/09/2015   PLT 324 12/09/2015   GLUCOSE 82 12/09/2015   CHOL 227 (H) 04/19/2014   TRIG 64.0 04/19/2014   HDL 58.50 04/19/2014   LDLDIRECT 138.6 01/19/2010   LDLCALC 156 (H) 04/19/2014   ALT 15 01/27/2015   AST 21 01/27/2015   NA 139 12/09/2015   K 3.4 (L)  12/09/2015   CL 100 (L) 12/09/2015   CREATININE 0.81 12/09/2015   BUN 12 12/09/2015   CO2 31 12/09/2015   TSH 1.04 04/19/2014   INR 1.02 01/17/2011   HGBA1C (H) 10/31/2008    6.2 (NOTE) The ADA recommends the following therapeutic goal for glycemic control related to Hgb A1c measurement: Goal of therapy: <6.5 Hgb A1c  Reference: American Diabetes Association: Clinical Practice Recommendations 2010, Diabetes Care, 2010, 33: (Suppl  1).    Dg Chest 2 View  Result Date: 03/06/2016 CLINICAL DATA:  75 year old female with a history of cough and wheezing for 2 weeks EXAM: CHEST  2 VIEW COMPARISON:  Chest x-ray 12/09/2015, 03/26/2015, 02/08/2015 FINDINGS: Cardiomediastinal silhouette unchanged. Right heart border partially obscured, with the lateral view demonstrating airspace opacity projecting over cardiac silhouette. Ill-defined interstitial/ airspace disease on the anterior view at the left base, overlying the cardiac silhouette. No pneumothorax or pleural effusion. Stigmata of emphysema, with increased retrosternal airspace, flattened hemidiaphragms, increased AP diameter, and hyperinflation on the AP view. No displaced fracture. Calcifications of the aortic arch. IMPRESSION: Airspace opacity of the right greater than left base, concerning for multifocal pneumonia given the history, likely involving middle lobe and the lingula. Followup PA and lateral chest X-ray is recommended in 3-4 weeks following therapy to assure resolution. Aortic atherosclerosis. Emphysema. Signed, Dulcy Fanny. Earleen Newport, DO [facility] Electronically Signed   By: Corrie Mckusick D.O.   On: 03/06/2016 07:57    Assessment & Plan:   Aemilia was seen today for pneumonia.  Diagnoses and all orders for this visit:  Pneumonia of both lower lobes due to infectious organism -     levofloxacin (LEVAQUIN) 750 MG tablet; Take 1 tablet (750 mg total) by mouth daily. -     DG Chest 2  View; Future  Asthma with bronchitis -     ipratropium-albuterol (DUONEB) 0.5-2.5 (3) MG/3ML nebulizer solution 3 mL; Take 3 mLs by nebulization once. -     levofloxacin (LEVAQUIN) 750 MG tablet; Take 1 tablet (750 mg total) by mouth daily.   I have discontinued Ms. Lauver's cefdinir and levofloxacin. I am also having her start on levofloxacin. Additionally, I am having her maintain her acetaminophen, multivitamin, loratadine, B Complex Vitamins (VITAMIN B COMPLEX PO), Ascorbic Acid (VITAMIN C PO), cholecalciferol, FLUTTER, Guaifenesin, diltiazem, SYMBICORT, omeprazole, albuterol, albuterol, promethazine-codeine, and predniSONE. We administered ipratropium-albuterol.  Meds ordered this encounter  Medications  . ipratropium-albuterol (DUONEB) 0.5-2.5 (3) MG/3ML nebulizer solution 3 mL  . levofloxacin (LEVAQUIN) 750 MG tablet    Sig: Take 1 tablet (750 mg total) by mouth  daily.    Dispense:  5 tablet    Refill:  0    Order Specific Question:   Supervising Provider    Answer:   Cassandria Anger [[id number]   Follow-up: Return if symptoms worsen or fail to improve.  Wilfred Lacy, NP

## 2016-03-08 NOTE — Progress Notes (Signed)
Pre visit review using our clinic review tool, if applicable. No additional management support is needed unless otherwise documented below in the visit note. 

## 2016-03-08 NOTE — Patient Instructions (Addendum)
Complete current dose pg levaquin by taking levaquin 250mg  2tabs once a day till complete. Then pick up new prescription and take 1tab  Once a day till complete  Return to basement for repeat CXR in 4weeks. Return to office sooner if symptoms do not continue to improve in the next 1week.  Take medications with food. Encourage adequate oral hydration.  Follow up with pulmonologist in next 3weeks. Use pulmonary vest as instructed by pulmonologist.

## 2016-03-24 DIAGNOSIS — J410 Simple chronic bronchitis: Secondary | ICD-10-CM | POA: Diagnosis not present

## 2016-03-29 ENCOUNTER — Other Ambulatory Visit (INDEPENDENT_AMBULATORY_CARE_PROVIDER_SITE_OTHER): Payer: Medicare Other

## 2016-03-29 ENCOUNTER — Ambulatory Visit (INDEPENDENT_AMBULATORY_CARE_PROVIDER_SITE_OTHER): Payer: Medicare Other | Admitting: Internal Medicine

## 2016-03-29 ENCOUNTER — Encounter: Payer: Self-pay | Admitting: Internal Medicine

## 2016-03-29 VITALS — BP 130/80 | HR 76 | Ht 62.0 in | Wt 134.0 lb

## 2016-03-29 DIAGNOSIS — K2100 Gastro-esophageal reflux disease with esophagitis, without bleeding: Secondary | ICD-10-CM

## 2016-03-29 DIAGNOSIS — Z0000 Encounter for general adult medical examination without abnormal findings: Secondary | ICD-10-CM

## 2016-03-29 DIAGNOSIS — I10 Essential (primary) hypertension: Secondary | ICD-10-CM | POA: Diagnosis not present

## 2016-03-29 DIAGNOSIS — E785 Hyperlipidemia, unspecified: Secondary | ICD-10-CM

## 2016-03-29 DIAGNOSIS — F411 Generalized anxiety disorder: Secondary | ICD-10-CM

## 2016-03-29 DIAGNOSIS — J479 Bronchiectasis, uncomplicated: Secondary | ICD-10-CM

## 2016-03-29 DIAGNOSIS — K219 Gastro-esophageal reflux disease without esophagitis: Secondary | ICD-10-CM

## 2016-03-29 DIAGNOSIS — J45909 Unspecified asthma, uncomplicated: Secondary | ICD-10-CM

## 2016-03-29 LAB — URINALYSIS, ROUTINE W REFLEX MICROSCOPIC
Bilirubin Urine: NEGATIVE
Hgb urine dipstick: NEGATIVE
Ketones, ur: NEGATIVE
NITRITE: NEGATIVE
PH: 5.5 (ref 5.0–8.0)
RBC / HPF: NONE SEEN (ref 0–?)
Specific Gravity, Urine: 1.025 (ref 1.000–1.030)
TOTAL PROTEIN, URINE-UPE24: NEGATIVE
URINE GLUCOSE: NEGATIVE
UROBILINOGEN UA: 0.2 (ref 0.0–1.0)

## 2016-03-29 LAB — LIPID PANEL
CHOLESTEROL: 219 mg/dL — AB (ref 0–200)
HDL: 79.2 mg/dL (ref >39.00)
LDL Cholesterol: 118 mg/dL — ABNORMAL HIGH (ref 0–99)
NONHDL: 139.98
TRIGLYCERIDES: 111 mg/dL (ref 0.0–149.0)
Total CHOL/HDL Ratio: 3
VLDL: 22.2 mg/dL (ref 0.0–40.0)

## 2016-03-29 LAB — CBC WITH DIFFERENTIAL/PLATELET
BASOS ABS: 0.1 10*3/uL (ref 0.0–0.1)
Basophils Relative: 1.4 % (ref 0.0–3.0)
EOS ABS: 0.5 10*3/uL (ref 0.0–0.7)
Eosinophils Relative: 6.1 % — ABNORMAL HIGH (ref 0.0–5.0)
HCT: 43.3 % (ref 36.0–46.0)
Hemoglobin: 14.4 g/dL (ref 12.0–15.0)
LYMPHS ABS: 2.4 10*3/uL (ref 0.7–4.0)
Lymphocytes Relative: 30.1 % (ref 12.0–46.0)
MCHC: 33.2 g/dL (ref 30.0–36.0)
MCV: 88.1 fl (ref 78.0–100.0)
MONO ABS: 0.7 10*3/uL (ref 0.1–1.0)
Monocytes Relative: 8.7 % (ref 3.0–12.0)
NEUTROS ABS: 4.3 10*3/uL (ref 1.4–7.7)
NEUTROS PCT: 53.7 % (ref 43.0–77.0)
PLATELETS: 320 10*3/uL (ref 150.0–400.0)
RBC: 4.91 Mil/uL (ref 3.87–5.11)
RDW: 15.7 % — ABNORMAL HIGH (ref 11.5–15.5)
WBC: 7.9 10*3/uL (ref 4.0–10.5)

## 2016-03-29 LAB — HEPATIC FUNCTION PANEL
ALBUMIN: 4.1 g/dL (ref 3.5–5.2)
ALT: 11 U/L (ref 0–35)
AST: 13 U/L (ref 0–37)
Alkaline Phosphatase: 75 U/L (ref 39–117)
Bilirubin, Direct: 0 mg/dL (ref 0.0–0.3)
Total Bilirubin: 0.5 mg/dL (ref 0.2–1.2)
Total Protein: 7.4 g/dL (ref 6.0–8.3)

## 2016-03-29 LAB — BASIC METABOLIC PANEL
BUN: 13 mg/dL (ref 6–23)
CALCIUM: 9.1 mg/dL (ref 8.4–10.5)
CO2: 31 meq/L (ref 19–32)
CREATININE: 0.84 mg/dL (ref 0.40–1.20)
Chloride: 101 mEq/L (ref 96–112)
GFR: 85.02 mL/min (ref >60.00)
GLUCOSE: 89 mg/dL (ref 70–99)
Potassium: 3.7 mEq/L (ref 3.5–5.1)
Sodium: 140 mEq/L (ref 135–145)

## 2016-03-29 LAB — TSH: TSH: 1 u[IU]/mL (ref 0.35–4.50)

## 2016-03-29 MED ORDER — FAMOTIDINE 40 MG PO TABS: 40.0000 mg | ORAL_TABLET | Freq: Every day | ORAL | 11 refills | Status: DC

## 2016-03-29 NOTE — Assessment & Plan Note (Signed)
[facility]

## 2016-03-29 NOTE — Progress Notes (Signed)
Subjective:  Patient ID: [name], female    DOB: 06-Apr-1941  Age: 75 y.o. MRN: [id number]  CC: No chief complaint on file.   HPI [name] presents for a well exam C/o GERD, a lot of saliva, mucus F/u bronchiectases   Outpatient Medications Prior to Visit  Medication Sig Dispense Refill  . acetaminophen (TYLENOL) 325 MG tablet Take 325 mg by mouth every 6 (six) hours as needed. For pain    . albuterol (PROAIR HFA) 108 (90 Base) MCG/ACT inhaler Inhale 2 puffs into the lungs every 4 (four) hours as needed for wheezing or shortness of breath. For shortness of breath 1 Inhaler 11  . albuterol (PROVENTIL) (2.5 MG/3ML) 0.083% nebulizer solution Take 3 mLs (2.5 mg total) by nebulization every 4 (four) hours as needed for wheezing or shortness of breath. 75 mL 5  . Ascorbic Acid (VITAMIN C PO) Take 1 tablet by mouth daily.     . B Complex Vitamins (VITAMIN B COMPLEX PO) Take 1 tablet by mouth daily as needed (takes when she remembers to take it).     . cholecalciferol (VITAMIN D) 1000 UNITS tablet Take 1,000 Units by mouth daily.    Marland Kitchen diltiazem (CARDIZEM) 120 MG tablet take 1 tablet by mouth twice a day 60 tablet 11  . Guaifenesin 1200 MG TB12 Take 1 tablet (1,200 mg total) by mouth 2 (two) times daily. 20 each 0  . loratadine (CLARITIN) 10 MG tablet Take 10 mg by mouth daily as needed for allergies.     . Multiple Vitamin (MULTIVITAMIN) tablet Take 1 tablet by mouth daily.      Marland Kitchen omeprazole (PRILOSEC) 20 MG capsule Take 1 capsule (20 mg total) by mouth daily. 30 capsule 3  . promethazine-codeine (PHENERGAN WITH CODEINE) 6.25-10 MG/5ML syrup Take 5 mLs by mouth every 6 (six) hours as needed for cough. 200 mL 0  . Respiratory Therapy Supplies (FLUTTER) DEVI Blow through 4 times per set and repeat 3 sets per day, to loosen lung secretions. 1 each 0  . SYMBICORT 160-4.5 MCG/ACT inhaler inhale 2 puffs by mouth twice a day 10.2 g 5  . predniSONE (DELTASONE) 10 MG tablet 3 tabs by mouth per  day for 3 days,2tabs per day for 3 days,1tab per day for 3 days (Patient not taking: Reported on 03/29/2016) 18 tablet 0   No facility-administered medications prior to visit.     ROS Review of Systems  Constitutional: Negative for activity change, appetite change, chills, fatigue and unexpected weight change.  HENT: Positive for postnasal drip, rhinorrhea and sore throat. Negative for congestion, mouth sores and sinus pressure.   Eyes: Negative for visual disturbance.  Respiratory: Positive for cough and wheezing. Negative for chest tightness.   Gastrointestinal: Negative for abdominal pain and nausea.  Genitourinary: Negative for difficulty urinating, frequency and vaginal pain.  Musculoskeletal: Negative for back pain and gait problem.  Skin: Negative for pallor and rash.  Neurological: Negative for dizziness, tremors, weakness, numbness and headaches.  Psychiatric/Behavioral: Negative for confusion and sleep disturbance.    Objective:  BP 130/80   Pulse 76   Ht 5\' 2"  (1.575 m)   Wt 134 lb (60.8 kg)   SpO2 99%   BMI 24.51 kg/m   BP Readings from Last 3 Encounters:  03/29/16 130/80  03/08/16 (!) 142/82  03/05/16 140/80    Wt Readings from Last 3 Encounters:  03/29/16 134 lb (60.8 kg)  03/08/16 131 lb (59.4 kg)  03/05/16 132  lb (59.9 kg)    Physical Exam  Constitutional: She appears well-developed. No distress.  HENT:  Head: Normocephalic.  Right Ear: External ear normal.  Left Ear: External ear normal.  Nose: Nose normal.  Mouth/Throat: Oropharynx is clear and moist.  Eyes: Conjunctivae are normal. Pupils are equal, round, and reactive to light. Right eye exhibits no discharge. Left eye exhibits no discharge.  Neck: Normal range of motion. Neck supple. No JVD present. No tracheal deviation present. No thyromegaly present.  Cardiovascular: Normal rate, regular rhythm and normal heart sounds.   Pulmonary/Chest: No stridor. No respiratory distress. She has no  wheezes.  Abdominal: Soft. Bowel sounds are normal. She exhibits no distension and no mass. There is no tenderness. There is no rebound and no guarding.  Musculoskeletal: She exhibits no edema or tenderness.  Lymphadenopathy:    She has no cervical adenopathy.  Neurological: She displays normal reflexes. No cranial nerve deficit. She exhibits normal muscle tone. Coordination normal.  Skin: No rash noted. No erythema.  Psychiatric: She has a normal mood and affect. Her behavior is normal. Judgment and thought content normal.    Lab Results  Component Value Date   WBC 8.3 12/09/2015   HGB 13.6 12/09/2015   HCT 43.3 12/09/2015   PLT 324 12/09/2015   GLUCOSE 82 12/09/2015   CHOL 227 (H) 04/19/2014   TRIG 64.0 04/19/2014   HDL 58.50 04/19/2014   LDLDIRECT 138.6 01/19/2010   LDLCALC 156 (H) 04/19/2014   ALT 15 01/27/2015   AST 21 01/27/2015   NA 139 12/09/2015   K 3.4 (L) 12/09/2015   CL 100 (L) 12/09/2015   CREATININE 0.81 12/09/2015   BUN 12 12/09/2015   CO2 31 12/09/2015   TSH 1.04 04/19/2014   INR 1.02 01/17/2011   HGBA1C (H) 10/31/2008    6.2 (NOTE) The ADA recommends the following therapeutic goal for glycemic control related to Hgb A1c measurement: Goal of therapy: <6.5 Hgb A1c  Reference: American Diabetes Association: Clinical Practice Recommendations 2010, Diabetes Care, 2010, 33: (Suppl  1).    Dg Chest 2 View  Result Date: 03/06/2016 CLINICAL DATA:  75 year old female with a history of cough and wheezing for 2 weeks EXAM: CHEST  2 VIEW COMPARISON:  Chest x-ray 12/09/2015, 03/26/2015, 02/08/2015 FINDINGS: Cardiomediastinal silhouette unchanged. Right heart border partially obscured, with the lateral view demonstrating airspace opacity projecting over cardiac silhouette. Ill-defined interstitial/ airspace disease on the anterior view at the left base, overlying the cardiac silhouette. No pneumothorax or pleural effusion. Stigmata of emphysema, with increased retrosternal  airspace, flattened hemidiaphragms, increased AP diameter, and hyperinflation on the AP view. No displaced fracture. Calcifications of the aortic arch. IMPRESSION: Airspace opacity of the right greater than left base, concerning for multifocal pneumonia given the history, likely involving middle lobe and the lingula. Followup PA and lateral chest X-ray is recommended in 3-4 weeks following therapy to assure resolution. Aortic atherosclerosis. Emphysema. Signed, Dulcy Fanny. Earleen Newport, DO [facility] Radiology Electronically Signed   By: Corrie Mckusick D.O.   On: 03/06/2016 07:57    Assessment & Plan:   There are no diagnoses linked to this encounter. I have discontinued Ms. Risinger's predniSONE. I am also having her maintain her acetaminophen, multivitamin, loratadine, B Complex Vitamins (VITAMIN B COMPLEX PO), Ascorbic Acid (VITAMIN C PO), cholecalciferol, FLUTTER, Guaifenesin, diltiazem, SYMBICORT, omeprazole, albuterol, albuterol, and promethazine-codeine.  No orders of the defined types were placed in this encounter.    Follow-up: No Follow-up on  file.  Walker Kehr, MD

## 2016-03-29 NOTE — Assessment & Plan Note (Signed)
GERD wedge pillow, contour pillow  Gluten free trial (no wheat products) for 4-6 weeks. OK to use gluten-free bread and gluten-free pasta.  Milk free trial (no milk, ice cream, cheese and yogurt) for 4-6 weeks. OK to use almond, coconut, rice or soy milk. "Almond breeze" brand tastes good.  Pepcid qhs

## 2016-03-29 NOTE — Progress Notes (Signed)
Pre visit review using our clinic review tool, if applicable. No additional management support is needed unless otherwise documented below in the visit note. 

## 2016-03-29 NOTE — Assessment & Plan Note (Addendum)
GERD wedge pillow, contour pillow  Gluten free trial (no wheat products) for 4-6 weeks. OK to use gluten-free bread and gluten-free pasta.  Milk free trial (no milk, ice cream, cheese and yogurt) for 4-6 weeks. OK to use almond, coconut, rice or soy milk. "Almond breeze" brand tastes good.  Added Pepcid qhs On Prilosec

## 2016-03-29 NOTE — Patient Instructions (Addendum)
GERD wedge pillow, contour pillow  Gluten free trial (no wheat products) for 4-6 weeks. OK to use gluten-free bread and gluten-free pasta.  Milk free trial (no milk, ice cream, cheese and yogurt) for 4-6 weeks. OK to use almond, coconut, rice or soy milk. "Almond breeze" brand tastes good.  Health Maintenance for Postmenopausal Women Introduction Menopause is a normal process in which your reproductive ability comes to an end. This process happens gradually over a span of months to years, usually between the ages of 54 and 3. Menopause is complete when you have missed 12 consecutive menstrual periods. It is important to talk with your health care provider about some of the most common conditions that affect postmenopausal women, such as heart disease, cancer, and bone loss (osteoporosis). Adopting a healthy lifestyle and getting preventive care can help to promote your health and wellness. Those actions can also lower your chances of developing some of these common conditions. What should I know about menopause? During menopause, you may experience a number of symptoms, such as:  Moderate-to-severe hot flashes.  Night sweats.  Decrease in sex drive.  Mood swings.  Headaches.  Tiredness.  Irritability.  Memory problems.  Insomnia. Choosing to treat or not to treat menopausal changes is an individual decision that you make with your health care provider. What should I know about hormone replacement therapy and supplements? Hormone therapy products are effective for treating symptoms that are associated with menopause, such as hot flashes and night sweats. Hormone replacement carries certain risks, especially as you become older. If you are thinking about using estrogen or estrogen with progestin treatments, discuss the benefits and risks with your health care provider. What should I know about heart disease and stroke? Heart disease, heart attack, and stroke become more likely as you  age. This may be due, in part, to the hormonal changes that your body experiences during menopause. These can affect how your body processes dietary fats, triglycerides, and cholesterol. Heart attack and stroke are both medical emergencies. There are many things that you can do to help prevent heart disease and stroke:  Have your blood pressure checked at least every 1-2 years. High blood pressure causes heart disease and increases the risk of stroke.  If you are 82-71 years old, ask your health care provider if you should take aspirin to prevent a heart attack or a stroke.  Do not use any tobacco products, including cigarettes, chewing tobacco, or electronic cigarettes. If you need help quitting, ask your health care provider.  It is important to eat a healthy diet and maintain a healthy weight.  Be sure to include plenty of vegetables, fruits, low-fat dairy products, and lean protein.  Avoid eating foods that are high in solid fats, added sugars, or salt (sodium).  Get regular exercise. This is one of the most important things that you can do for your health.  Try to exercise for at least 150 minutes each week. The type of exercise that you do should increase your heart rate and make you sweat. This is known as moderate-intensity exercise.  Try to do strengthening exercises at least twice each week. Do these in addition to the moderate-intensity exercise.  Know your numbers.Ask your health care provider to check your cholesterol and your blood glucose. Continue to have your blood tested as directed by your health care provider. What should I know about cancer screening? There are several types of cancer. Take the following steps to reduce your risk and to  catch any cancer development as early as possible. Breast Cancer  Practice breast self-awareness.  This means understanding how your breasts normally appear and feel.  It also means doing regular breast self-exams. Let your health  care provider know about any changes, no matter how small.  If you are 37 or older, have a clinician do a breast exam (clinical breast exam or CBE) every year. Depending on your age, family history, and medical history, it may be recommended that you also have a yearly breast X-ray (mammogram).  If you have a family history of breast cancer, talk with your health care provider about genetic screening.  If you are at high risk for breast cancer, talk with your health care provider about having an MRI and a mammogram every year.  Breast cancer (BRCA) gene test is recommended for women who have family members with BRCA-related cancers. Results of the assessment will determine the need for genetic counseling and BRCA1 and for BRCA2 testing. BRCA-related cancers include these types:  Breast. This occurs in males or females.  Ovarian.  Tubal. This may also be called fallopian tube cancer.  Cancer of the abdominal or pelvic lining (peritoneal cancer).  Prostate.  Pancreatic. Cervical, Uterine, and Ovarian Cancer  Your health care provider may recommend that you be screened regularly for cancer of the pelvic organs. These include your ovaries, uterus, and vagina. This screening involves a pelvic exam, which includes checking for microscopic changes to the surface of your cervix (Pap test).  For women ages 21-65, health care providers may recommend a pelvic exam and a Pap test every three years. For women ages 68-65, they may recommend the Pap test and pelvic exam, combined with testing for human papilloma virus (HPV), every five years. Some types of HPV increase your risk of cervical cancer. Testing for HPV may also be done on women of any age who have unclear Pap test results.  Other health care providers may not recommend any screening for nonpregnant women who are considered low risk for pelvic cancer and have no symptoms. Ask your health care provider if a screening pelvic exam is right for  you.  If you have had past treatment for cervical cancer or a condition that could lead to cancer, you need Pap tests and screening for cancer for at least 20 years after your treatment. If Pap tests have been discontinued for you, your risk factors (such as having a new sexual partner) need to be reassessed to determine if you should start having screenings again. Some women have medical problems that increase the chance of getting cervical cancer. In these cases, your health care provider may recommend that you have screening and Pap tests more often.  If you have a family history of uterine cancer or ovarian cancer, talk with your health care provider about genetic screening.  If you have vaginal bleeding after reaching menopause, tell your health care provider.  There are currently no reliable tests available to screen for ovarian cancer. Lung Cancer  Lung cancer screening is recommended for adults 73-71 years old who are at high risk for lung cancer because of a history of smoking. A yearly low-dose CT scan of the lungs is recommended if you:  Currently smoke.  Have a history of at least 30 pack-years of smoking and you currently smoke or have quit within the past 15 years. A pack-year is smoking an average of one pack of cigarettes per day for one year. Yearly screening should:  Continue  until it has been 15 years since you quit.  Stop if you develop a health problem that would prevent you from having lung cancer treatment. Colorectal Cancer  This type of cancer can be detected and can often be prevented.  Routine colorectal cancer screening usually begins at age 18 and continues through age 2.  If you have risk factors for colon cancer, your health care provider may recommend that you be screened at an earlier age.  If you have a family history of colorectal cancer, talk with your health care provider about genetic screening.  Your health care provider may also recommend using  home test kits to check for hidden blood in your stool.  A small camera at the end of a tube can be used to examine your colon directly (sigmoidoscopy or colonoscopy). This is done to check for the earliest forms of colorectal cancer.  Direct examination of the colon should be repeated every 5-10 years until age 39. However, if early forms of precancerous polyps or small growths are found or if you have a family history or genetic risk for colorectal cancer, you may need to be screened more often. Skin Cancer  Check your skin from head to toe regularly.  Monitor any moles. Be sure to tell your health care provider:  About any new moles or changes in moles, especially if there is a change in a mole's shape or color.  If you have a mole that is larger than the size of a pencil eraser.  If any of your family members has a history of skin cancer, especially at a young age, talk with your health care provider about genetic screening.  Always use sunscreen. Apply sunscreen liberally and repeatedly throughout the day.  Whenever you are outside, protect yourself by wearing long sleeves, pants, a wide-brimmed hat, and sunglasses. What should I know about osteoporosis? Osteoporosis is a condition in which bone destruction happens more quickly than new bone creation. After menopause, you may be at an increased risk for osteoporosis. To help prevent osteoporosis or the bone fractures that can happen because of osteoporosis, the following is recommended:  If you are 17-38 years old, get at least 1,000 mg of calcium and at least 600 mg of vitamin D per day.  If you are older than age 37 but younger than age 47, get at least 1,200 mg of calcium and at least 600 mg of vitamin D per day.  If you are older than age 51, get at least 1,200 mg of calcium and at least 800 mg of vitamin D per day. Smoking and excessive alcohol intake increase the risk of osteoporosis. Eat foods that are rich in calcium and  vitamin D, and do weight-bearing exercises several times each week as directed by your health care provider. What should I know about how menopause affects my mental health? Depression may occur at any age, but it is more common as you become older. Common symptoms of depression include:  Low or sad mood.  Changes in sleep patterns.  Changes in appetite or eating patterns.  Feeling an overall lack of motivation or enjoyment of activities that you previously enjoyed.  Frequent crying spells. Talk with your health care provider if you think that you are experiencing depression. What should I know about immunizations? It is important that you get and maintain your immunizations. These include:  Tetanus, diphtheria, and pertussis (Tdap) booster vaccine.  Influenza every year before the flu season begins.  Pneumonia vaccine.  Shingles vaccine. Your health care provider may also recommend other immunizations. This information is not intended to replace advice given to you by your health care provider. Make sure you discuss any questions you have with your health care provider. Document Released: 06/08/2005 Document Revised: 11/04/2015 Document Reviewed: 01/18/2015  2017 [employer]

## 2016-03-29 NOTE — Assessment & Plan Note (Signed)
Doing fair 

## 2016-03-29 NOTE — Assessment & Plan Note (Signed)

## 2016-04-04 DIAGNOSIS — J471 Bronchiectasis with (acute) exacerbation: Secondary | ICD-10-CM | POA: Diagnosis not present

## 2016-04-04 DIAGNOSIS — J410 Simple chronic bronchitis: Secondary | ICD-10-CM | POA: Diagnosis not present

## 2016-04-23 DIAGNOSIS — J410 Simple chronic bronchitis: Secondary | ICD-10-CM | POA: Diagnosis not present

## 2016-05-05 DIAGNOSIS — J410 Simple chronic bronchitis: Secondary | ICD-10-CM | POA: Diagnosis not present

## 2016-05-05 DIAGNOSIS — J471 Bronchiectasis with (acute) exacerbation: Secondary | ICD-10-CM | POA: Diagnosis not present

## 2016-05-12 ENCOUNTER — Observation Stay (HOSPITAL_COMMUNITY)
Admission: EM | Admit: 2016-05-12 | Discharge: 2016-05-14 | Disposition: A | Discharge status: Home / Self Care | Payer: Medicare Other | Attending: Internal Medicine | Admitting: Internal Medicine

## 2016-05-12 ENCOUNTER — Encounter (HOSPITAL_COMMUNITY): Payer: Self-pay | Admitting: Emergency Medicine

## 2016-05-12 ENCOUNTER — Emergency Department (HOSPITAL_COMMUNITY): Payer: Medicare Other

## 2016-05-12 DIAGNOSIS — J4541 Moderate persistent asthma with (acute) exacerbation: Secondary | ICD-10-CM | POA: Insufficient documentation

## 2016-05-12 DIAGNOSIS — J479 Bronchiectasis, uncomplicated: Secondary | ICD-10-CM | POA: Insufficient documentation

## 2016-05-12 DIAGNOSIS — Z79899 Other long term (current) drug therapy: Secondary | ICD-10-CM | POA: Insufficient documentation

## 2016-05-12 DIAGNOSIS — R0602 Shortness of breath: Secondary | ICD-10-CM | POA: Diagnosis not present

## 2016-05-12 DIAGNOSIS — J441 Chronic obstructive pulmonary disease with (acute) exacerbation: Secondary | ICD-10-CM | POA: Diagnosis not present

## 2016-05-12 DIAGNOSIS — I10 Essential (primary) hypertension: Secondary | ICD-10-CM | POA: Insufficient documentation

## 2016-05-12 DIAGNOSIS — K219 Gastro-esophageal reflux disease without esophagitis: Secondary | ICD-10-CM | POA: Diagnosis not present

## 2016-05-12 DIAGNOSIS — J45901 Unspecified asthma with (acute) exacerbation: Secondary | ICD-10-CM | POA: Insufficient documentation

## 2016-05-12 DIAGNOSIS — Z7951 Long term (current) use of inhaled steroids: Secondary | ICD-10-CM | POA: Insufficient documentation

## 2016-05-12 DIAGNOSIS — J45909 Unspecified asthma, uncomplicated: Secondary | ICD-10-CM | POA: Diagnosis present

## 2016-05-12 LAB — COMPREHENSIVE METABOLIC PANEL
ALBUMIN: 3.9 g/dL (ref 3.5–5.0)
ALK PHOS: 74 U/L (ref 38–126)
ALT: 13 U/L — AB (ref 14–54)
AST: 20 U/L (ref 15–41)
Anion gap: 12 (ref 5–15)
BUN: 9 mg/dL (ref 6–20)
CALCIUM: 9.4 mg/dL (ref 8.9–10.3)
CHLORIDE: 100 mmol/L — AB (ref 101–111)
CO2: 28 mmol/L (ref 22–32)
CREATININE: 0.94 mg/dL (ref 0.44–1.00)
GFR calc Af Amer: >60 mL/min (ref >60)
GFR calc non Af Amer: 58 mL/min — ABNORMAL LOW (ref >60)
GLUCOSE: 109 mg/dL — AB (ref 65–99)
Potassium: 3.7 mmol/L (ref 3.5–5.1)
SODIUM: 140 mmol/L (ref 135–145)
Total Bilirubin: 0.5 mg/dL (ref 0.3–1.2)
Total Protein: 7.7 g/dL (ref 6.5–8.1)

## 2016-05-12 LAB — CBC WITH DIFFERENTIAL/PLATELET
Basophils Absolute: 0.1 10*3/uL (ref 0.0–0.1)
Basophils Relative: 1 %
Eosinophils Absolute: 0.2 10*3/uL (ref 0.0–0.7)
Eosinophils Relative: 2 %
HCT: 45.3 % (ref 36.0–46.0)
Hemoglobin: 15.1 g/dL — ABNORMAL HIGH (ref 12.0–15.0)
Lymphocytes Relative: 20 %
Lymphs Abs: 2.1 10*3/uL (ref 0.7–4.0)
MCH: 29.8 pg (ref 26.0–34.0)
MCHC: 33.3 g/dL (ref 30.0–36.0)
MCV: 89.3 fL (ref 78.0–100.0)
Monocytes Absolute: 0.7 10*3/uL (ref 0.1–1.0)
Monocytes Relative: 7 %
Neutro Abs: 7.3 10*3/uL (ref 1.7–7.7)
Neutrophils Relative %: 70 %
Platelets: 332 10*3/uL (ref 150–400)
RBC: 5.07 MIL/uL (ref 3.87–5.11)
RDW: 14.4 % (ref 11.5–15.5)
WBC: 10.4 10*3/uL (ref 4.0–10.5)

## 2016-05-12 LAB — I-STAT TROPONIN, ED: Troponin i, poc: 0.01 ng/mL (ref 0.00–0.08)

## 2016-05-12 MED ORDER — MOMETASONE FURO-FORMOTEROL FUM 200-5 MCG/ACT IN AERO
2.0000 | INHALATION_SPRAY | Freq: Two times a day (BID) | RESPIRATORY_TRACT | Status: DC
  Administered 2016-05-12 – 2016-05-14 (×4): 2 via RESPIRATORY_TRACT
  Filled 2016-05-12: qty 8.8

## 2016-05-12 MED ORDER — METHYLPREDNISOLONE SODIUM SUCC 125 MG IJ SOLR
60.0000 mg | Freq: Three times a day (TID) | INTRAMUSCULAR | Status: DC
  Administered 2016-05-12 – 2016-05-14 (×5): 60 mg via INTRAVENOUS
  Filled 2016-05-12 (×5): qty 2

## 2016-05-12 MED ORDER — SODIUM CHLORIDE 0.9% FLUSH
3.0000 mL | Freq: Two times a day (BID) | INTRAVENOUS | Status: DC
  Administered 2016-05-13 – 2016-05-14 (×3): 3 mL via INTRAVENOUS

## 2016-05-12 MED ORDER — HYDROCODONE-ACETAMINOPHEN 5-325 MG PO TABS: 1.0000 | ORAL_TABLET | ORAL | Status: DC | PRN

## 2016-05-12 MED ORDER — SODIUM CHLORIDE 0.9 % IV SOLN: 250.0000 mL | INTRAVENOUS | Status: DC | PRN

## 2016-05-12 MED ORDER — SODIUM CHLORIDE 0.9% FLUSH: 3.0000 mL | INTRAVENOUS | Status: DC | PRN

## 2016-05-12 MED ORDER — LEVALBUTEROL HCL 0.63 MG/3ML IN NEBU
0.6300 mg | INHALATION_SOLUTION | RESPIRATORY_TRACT | Status: DC | PRN
  Administered 2016-05-12: 0.63 mg via RESPIRATORY_TRACT
  Filled 2016-05-12: qty 3

## 2016-05-12 MED ORDER — LEVALBUTEROL HCL 0.63 MG/3ML IN NEBU
INHALATION_SOLUTION | RESPIRATORY_TRACT | Status: AC
  Administered 2016-05-12: 0.63 mg via RESPIRATORY_TRACT
  Filled 2016-05-12: qty 3

## 2016-05-12 MED ORDER — LEVALBUTEROL HCL 0.63 MG/3ML IN NEBU: 0.6300 mg | INHALATION_SOLUTION | Freq: Once | RESPIRATORY_TRACT | Status: DC

## 2016-05-12 MED ORDER — ACETAMINOPHEN 500 MG PO TABS: 500.0000 mg | ORAL_TABLET | Freq: Four times a day (QID) | ORAL | Status: DC | PRN

## 2016-05-12 MED ORDER — SODIUM CHLORIDE 0.9 % IV BOLUS (SEPSIS)
1000.0000 mL | Freq: Once | INTRAVENOUS | Status: AC
  Administered 2016-05-12: 1000 mL via INTRAVENOUS

## 2016-05-12 MED ORDER — ONDANSETRON HCL 4 MG PO TABS: 4.0000 mg | ORAL_TABLET | Freq: Four times a day (QID) | ORAL | Status: DC | PRN

## 2016-05-12 MED ORDER — METHYLPREDNISOLONE SODIUM SUCC 125 MG IJ SOLR
125.0000 mg | Freq: Once | INTRAMUSCULAR | Status: AC
  Administered 2016-05-12: 125 mg via INTRAVENOUS
  Filled 2016-05-12: qty 2

## 2016-05-12 MED ORDER — IPRATROPIUM-ALBUTEROL 0.5-2.5 (3) MG/3ML IN SOLN
3.0000 mL | RESPIRATORY_TRACT | Status: AC
  Administered 2016-05-12: 3 mL via RESPIRATORY_TRACT
  Filled 2016-05-12: qty 3

## 2016-05-12 MED ORDER — ADULT MULTIVITAMIN W/MINERALS CH
1.0000 | ORAL_TABLET | Freq: Every day | ORAL | Status: DC
  Administered 2016-05-13 – 2016-05-14 (×2): 1 via ORAL
  Filled 2016-05-12 (×3): qty 1

## 2016-05-12 MED ORDER — PROMETHAZINE-CODEINE 6.25-10 MG/5ML PO SYRP
5.0000 mL | ORAL_SOLUTION | Freq: Four times a day (QID) | ORAL | Status: DC | PRN
  Filled 2016-05-12: qty 5

## 2016-05-12 MED ORDER — LEVALBUTEROL HCL 0.63 MG/3ML IN NEBU
0.6300 mg | INHALATION_SOLUTION | RESPIRATORY_TRACT | Status: DC | PRN
  Administered 2016-05-12: 0.63 mg via RESPIRATORY_TRACT

## 2016-05-12 MED ORDER — ALBUTEROL (5 MG/ML) CONTINUOUS INHALATION SOLN
10.0000 mg/h | INHALATION_SOLUTION | Freq: Once | RESPIRATORY_TRACT | Status: DC
  Filled 2016-05-12: qty 20

## 2016-05-12 MED ORDER — MAGNESIUM SULFATE 2 GM/50ML IV SOLN
2.0000 g | Freq: Once | INTRAVENOUS | Status: AC
  Administered 2016-05-12: 2 g via INTRAVENOUS
  Filled 2016-05-12: qty 50

## 2016-05-12 MED ORDER — ONDANSETRON HCL 4 MG/2ML IJ SOLN: 4.0000 mg | Freq: Four times a day (QID) | INTRAMUSCULAR | Status: DC | PRN

## 2016-05-12 MED ORDER — ENOXAPARIN SODIUM 40 MG/0.4ML ~~LOC~~ SOLN
40.0000 mg | SUBCUTANEOUS | Status: DC
  Administered 2016-05-12 – 2016-05-13 (×2): 40 mg via SUBCUTANEOUS
  Filled 2016-05-12 (×2): qty 0.4

## 2016-05-12 MED ORDER — DILTIAZEM HCL 60 MG PO TABS
120.0000 mg | ORAL_TABLET | Freq: Two times a day (BID) | ORAL | Status: DC
  Administered 2016-05-12 – 2016-05-14 (×4): 120 mg via ORAL
  Filled 2016-05-12 (×4): qty 2

## 2016-05-12 NOTE — H&P (Signed)
History and Physical    [name] [id number] DOB: [birth year] DOA: 05/12/2016  PCP: Walker Kehr, MD   Patient coming from: Home  Chief Complaint: Dyspnea, productive cough  HPI: [name] is a 76 y.o. female with medical history significant for chronic obstructive lung disease and bronchiectasis with chronic 2 L/m supplemental oxygen requirement, now presenting to the emergency department for evaluation of progressive dyspnea and productive cough over the past week. Patient reports that she had been in usual state of health until approximately one week ago when she noticed an insidious worsening in her exertional dyspnea. She had also developed worsening in her chronic cough around that same time and it has been productive of scant white sputum. She denies any accompanying fevers, chills, rhinorrhea, or sore throat. She denies any sick contacts and has not been on antibiotics recently. She has been using her home inhalers with no significant relief. She denies any leg swelling or tenderness, prolonged immobilization, long distance travel, chest pain, palpitations, or hemoptysis. She was not vaccinated against seasonal influenza this year.  ED Course: Upon arrival to the ED, patient is found to be afebrile, requiring 2 L per minute of supplemental oxygen to maintain adequate saturations, mildly tachycardic, and mildly hypertensive. EKG featured sinus tachycardia with rate 104 and left axis deviation. Chest x-ray features mild changes of COPD, but is negative for acute cardiopulmonary disease. Chemistry panel is unremarkable and CBC is also essentially normal. Troponin is negative. Patient was treated with 1 liter of normal saline, 125 mg IV Solu-Medrol, 2 g of IV magnesium, and Xopenex nebs. She enjoyed some mild improvement in her condition with these measures, but continues to be dyspneic at rest and will be observed on the medical/surgical unit for ongoing evaluation and  treatment.  Review of Systems:  All other systems reviewed and apart from HPI, are negative.  Past Medical History:  Diagnosis Date  . Allergic rhinitis   . Anxiety   . Asthma   . HTN (hypertension)   . Pneumonia   . Shortness of breath dyspnea     Past Surgical History:  Procedure Laterality Date  . TUBAL LIGATION       reports that she has never smoked. She has never used smokeless tobacco. She reports that she does not drink alcohol or use drugs.  Allergies  Allergen Reactions  . Clindamycin Other (See Comments)    REACTION: Neck, tongue swelling, SOB \\T \ rash  . Fruit & Vegetable Daily [Nutritional Supplements] Other (See Comments)    Tongue swelling, vomiting  . Penicillins Swelling  . Shellfish Allergy Anaphylaxis  . Sulfonamide Derivatives Swelling    Throat swelling  . Aspirin Other (See Comments)    States stomach bubbles, becomes gaseous and irritated  . Montelukast Sodium Other (See Comments)    REACTION: hallucination  . Bee Venom Other (See Comments)    Unknown  . Latex Itching and Swelling  . Azithromycin Other (See Comments)    "makes me gag"  . Fluticasone-Salmeterol Other (See Comments)    REACTION: hoarseness  . Ventolin [Albuterol] Other (See Comments)    cough    Family History  Problem Relation Age of Onset  . Diabetes Father   . Asthma Father   . Allergies Father   . Mental illness Mother     alzheimer's  . Allergies Brother   . Allergies Brother   . Heart disease Maternal Aunt      Prior to Admission medications   Medication  Sig Start Date End Date Taking? Authorizing Provider  acetaminophen (TYLENOL) 325 MG tablet Take 325 mg by mouth every 6 (six) hours as needed for moderate pain or headache.    Yes Historical Provider, MD  albuterol (PROAIR HFA) 108 (90 Base) MCG/ACT inhaler Inhale 2 puffs into the lungs every 4 (four) hours as needed for wheezing or shortness of breath. For shortness of breath 03/05/16  Yes Biagio Borg, MD   albuterol (PROVENTIL) (2.5 MG/3ML) 0.083% nebulizer solution Take 3 mLs (2.5 mg total) by nebulization every 4 (four) hours as needed for wheezing or shortness of breath. 03/05/16  Yes Biagio Borg, MD  Ascorbic Acid (VITAMIN C PO) Take 1 tablet by mouth daily.    Yes Historical Provider, MD  B Complex Vitamins (VITAMIN B COMPLEX PO) Take 1 tablet by mouth daily as needed (takes when she remembers to take it).    Yes Historical Provider, MD  diltiazem (CARDIZEM) 120 MG tablet take 1 tablet by mouth twice a day 12/06/15  Yes Cassandria Anger, MD  Multiple Vitamin (MULTIVITAMIN) tablet Take 1 tablet by mouth daily.     Yes Historical Provider, MD  promethazine-codeine (PHENERGAN WITH CODEINE) 6.25-10 MG/5ML syrup Take 5 mLs by mouth every 6 (six) hours as needed for cough. 03/05/16  Yes Biagio Borg, MD  SYMBICORT 160-4.5 MCG/ACT inhaler inhale 2 puffs by mouth twice a day 01/18/16  Yes Deneise Lever, MD  famotidine (PEPCID) 40 MG tablet Take 1 tablet (40 mg total) by mouth daily. Patient not taking: Reported on 05/12/2016 03/29/16   Cassandria Anger, MD  Guaifenesin 1200 MG TB12 Take 1 tablet (1,200 mg total) by mouth 2 (two) times daily. Patient not taking: Reported on 05/12/2016 03/26/15   Dalia Heading, PA-C  loratadine (CLARITIN) 10 MG tablet Take 10 mg by mouth daily as needed for allergies.  08/16/10   Evie Lacks Plotnikov, MD  omeprazole (PRILOSEC) 20 MG capsule Take 1 capsule (20 mg total) by mouth daily. Patient not taking: Reported on 05/12/2016 02/14/16   Golden Circle, FNP  Respiratory Therapy Supplies (FLUTTER) DEVI Blow through 4 times per set and repeat 3 sets per day, to loosen lung secretions. Patient not taking: Reported on 05/12/2016 07/16/14   Deneise Lever, MD    Physical Exam: Vitals:   05/12/16 1700 05/12/16 1715 05/12/16 1745 05/12/16 1800  BP: 171/92 166/76 157/82 150/82  Pulse: 108 113 95 93  Resp: 23 24 22 22   Temp:      TempSrc:      SpO2: 99% 96% 95% 93%   Weight:      Height:          Constitutional: Increased WOB, no pallor, no diaphoresis, calm, comfortable Eyes: PERTLA, lids and conjunctivae normal ENMT: Mucous membranes are moist. Posterior pharynx clear of any exudate or lesions.   Neck: normal, supple, no masses, no thyromegaly Respiratory: Breath sounds diminished bilaterally with prolonged expiratory phase and occasional expiratory wheeze. WOB mildly increased. Dyspnea with full sentence. No pallor or cyanosis.  Cardiovascular: Rate ~110 and regular. No extremity edema. No significant JVD. Abdomen: No distension, no tenderness, no masses palpated. Bowel sounds normal.  Musculoskeletal: no clubbing / cyanosis. No joint deformity upper and lower extremities. Normal muscle tone.  Skin: no significant rashes, lesions, ulcers. Warm, dry, well-perfused. Neurologic: CN 2-12 grossly intact. Sensation intact, DTR normal. Strength 5/5 in all 4 limbs.  Psychiatric: Normal judgment and insight. Alert and oriented x 3. Normal mood  and affect.     Labs on Admission: I have personally reviewed following labs and imaging studies  CBC:  Recent Labs Lab 05/12/16 1605  WBC 10.4  NEUTROABS 7.3  HGB 15.1*  HCT 45.3  MCV 89.3  PLT [id number]   Basic Metabolic Panel:  Recent Labs Lab 05/12/16 1605  NA 140  K 3.7  CL 100*  CO2 28  GLUCOSE 109*  BUN 9  CREATININE 0.94  CALCIUM 9.4   GFR: Estimated Creatinine Clearance: 42.8 mL/min (by C-G formula based on SCr of 0.94 mg/dL). Liver Function Tests:  Recent Labs Lab 05/12/16 1605  AST 20  ALT 13*  ALKPHOS 74  BILITOT 0.5  PROT 7.7  ALBUMIN 3.9   No results for input(s): LIPASE, AMYLASE in the last 168 hours. No results for input(s): AMMONIA in the last 168 hours. Coagulation Profile: No results for input(s): INR, PROTIME in the last 168 hours. Cardiac Enzymes: No results for input(s): CKTOTAL, CKMB, CKMBINDEX, TROPONINI in the last 168 hours. BNP (last 3 results) No results  for input(s): PROBNP in the last 8760 hours. HbA1C: No results for input(s): HGBA1C in the last 72 hours. CBG: No results for input(s): GLUCAP in the last 168 hours. Lipid Profile: No results for input(s): CHOL, HDL, LDLCALC, TRIG, CHOLHDL, LDLDIRECT in the last 72 hours. Thyroid Function Tests: No results for input(s): TSH, T4TOTAL, FREET4, T3FREE, THYROIDAB in the last 72 hours. Anemia Panel: No results for input(s): VITAMINB12, FOLATE, FERRITIN, TIBC, IRON, RETICCTPCT in the last 72 hours. Urine analysis:    Component Value Date/Time   COLORURINE YELLOW 03/29/2016 1050   APPEARANCEUR CLEAR 03/29/2016 1050   LABSPEC 1.025 03/29/2016 1050   PHURINE 5.5 03/29/2016 1050   GLUCOSEU NEGATIVE 03/29/2016 1050   HGBUR NEGATIVE 03/29/2016 1050   BILIRUBINUR NEGATIVE 03/29/2016 1050   KETONESUR NEGATIVE 03/29/2016 1050   PROTEINUR NEGATIVE 07/25/2013 1018   UROBILINOGEN 0.2 03/29/2016 1050   NITRITE NEGATIVE 03/29/2016 1050   LEUKOCYTESUR SMALL (A) 03/29/2016 1050   Sepsis Labs: @LABRCNTIP (procalcitonin:4,lacticidven:4) )No results found for this or any previous visit (from the past 240 hour(s)).   Radiological Exams on Admission: Dg Chest 2 View  Result Date: 05/12/2016 CLINICAL DATA:  Shortness of breath for the past week. History of asthma and hypertension. EXAM: CHEST  2 VIEW COMPARISON:  03/05/2016. FINDINGS: Normal sized heart. Clear lungs. The lungs are mildly hyperexpanded. Aortic arch calcification. Mild thoracic spine degenerative changes. Small right humeral head cysts. Diffuse osteopenia. IMPRESSION: No acute abnormality. Mild changes of COPD. Aortic atherosclerosis. Electronically Signed   By: Claudie Revering M.D.   On: 05/12/2016 15:30    EKG: Independently reviewed. Sinus tachycardia (rate 104), LAD  Assessment/Plan  1. COPD with acute exacerbation  - Pt presents with progressive dyspnea (now dyspneic at rest), increased cough, increased sputum production  - Uncertain  precipitant; no infectious s/s, clear CXR  - She improved some in ED with IV steroids, bronchodilators, and IV magnesium, but remains dyspneic at rest and wheezing  - Plan to check sputum culture and respiratory virus panel, continue scheduled ICS/LABA inhaler, systemic steroids, and prn Xoponex nebs  - Continue supplemental O2 as needed to keep sat >92%  - Supportive care with prn analgesia, mucolytics   2. Hypertension  - Mildly hypertensive on admission  - Continue diltiazem as tolerated    DVT prophylaxis: sq Lovenox  Code Status: Full  Family Communication: Husband updated at bedside Disposition Plan: Observe on med-surg Consults called: None Admission status: Observation  Vianne Bulls, MD [facility] Pager [phone]  If 7PM-7AM, please contact night-coverage [email or web address] Password [facility]  05/12/2016, 6:32 PM

## 2016-05-12 NOTE — ED Notes (Signed)
Hooked patient up to the monitor patient is resting waiting on provider 

## 2016-05-12 NOTE — ED Notes (Signed)
PT states, "I need to speak to my doctor. I'm having second thoughts about being admitted. Everyone here is sick and I don't want to expose myself." Elmyra Ricks, RN made aware and [facility] paged.

## 2016-05-12 NOTE — ED Triage Notes (Signed)
Pt. Stated. I have asthma and on 2L oxygen off and on and Im still having the SOB for over a week. I've tried my Inhaler and symicort and no help.

## 2016-05-12 NOTE — ED Notes (Signed)
Pt states that she will stay and be admitted to inpatient as previously discussed by provider.

## 2016-05-12 NOTE — ED Provider Notes (Signed)
[facility] DEPT Provider Note   CSN: [id number] Arrival date & time: 05/12/16  1303     History   Chief Complaint Chief Complaint  Patient presents with  . Asthma  . Shortness of Breath  . Cough    HPI [name] is a 76 y.o. female.  HPI 76 year old Female who presents with shortness of breath. She has a history of hypertension, allergic rhinitis, asthma, on 2 L home oxygen only as needed. Reports gradually worsening shortness of breath that has been ongoing for one week, and has been the worst over the past 24 hours. Has only been taking Symbicort for her symptoms, with no effect. Has had mild cough that is nonproductive with runny nose. No fevers or chills, myalgias or arthralgias, headaches, vomiting or diarrhea, or abdominal pain.Has had chest tightness and fullness. No lower extremity edema or swelling or pain. No history of PE/DVT, recent immobilization, or exogenous hormones.  Past Medical History:  Diagnosis Date  . Allergic rhinitis   . Anxiety   . Asthma   . HTN (hypertension)   . Pneumonia   . Shortness of breath dyspnea     Patient Active Problem List   Diagnosis Date Noted  . Wheezing 03/05/2016  . GERD (gastroesophageal reflux disease) 02/14/2016  . Tongue swelling 07/12/2015  . PNA (pneumonia) 01/27/2015  . Community acquired pneumonia 01/27/2015  . Reactive depression (situational) 10/05/2014  . Oral thrush 04/01/2014  . Pneumonia 07/25/2013  . Well adult exam 04/10/2013  . URI, acute 03/26/2012  . Bronchiectasis with retained secretions 11/10/2010  . COUGH 01/18/2010  . TACHYCARDIA 10/29/2008  . DYSPNEA 10/29/2008  . Chronic bronchitis NEC 07/12/2008  . CERUMEN IMPACTION 04/01/2008  . ALLERGIC RHINITIS 11/13/2007  . LEG PAIN 09/10/2007  . Rash and other nonspecific skin eruption 09/10/2007  . HOARSENESS 09/10/2007  . CYSTITIS 05/09/2007  . Anxiety state 02/04/2007  . Essential hypertension 02/04/2007  . DEPRESSION 01/30/2007  . Asthma  with bronchitis 11/27/2006    Past Surgical History:  Procedure Laterality Date  . TUBAL LIGATION      OB History    No data available       Home Medications    Prior to Admission medications   Medication Sig Start Date End Date Taking? Authorizing Provider  acetaminophen (TYLENOL) 325 MG tablet Take 325 mg by mouth every 6 (six) hours as needed for moderate pain or headache.    Yes Historical Provider, MD  albuterol (PROAIR HFA) 108 (90 Base) MCG/ACT inhaler Inhale 2 puffs into the lungs every 4 (four) hours as needed for wheezing or shortness of breath. For shortness of breath 03/05/16  Yes Biagio Borg, MD  albuterol (PROVENTIL) (2.5 MG/3ML) 0.083% nebulizer solution Take 3 mLs (2.5 mg total) by nebulization every 4 (four) hours as needed for wheezing or shortness of breath. 03/05/16  Yes Biagio Borg, MD  Ascorbic Acid (VITAMIN C PO) Take 1 tablet by mouth daily.    Yes Historical Provider, MD  B Complex Vitamins (VITAMIN B COMPLEX PO) Take 1 tablet by mouth daily as needed (takes when she remembers to take it).    Yes Historical Provider, MD  diltiazem (CARDIZEM) 120 MG tablet take 1 tablet by mouth twice a day 12/06/15  Yes Cassandria Anger, MD  Multiple Vitamin (MULTIVITAMIN) tablet Take 1 tablet by mouth daily.     Yes Historical Provider, MD  promethazine-codeine (PHENERGAN WITH CODEINE) 6.25-10 MG/5ML syrup Take 5 mLs by mouth every 6 (  six) hours as needed for cough. 03/05/16  Yes Biagio Borg, MD  SYMBICORT 160-4.5 MCG/ACT inhaler inhale 2 puffs by mouth twice a day 01/18/16  Yes Deneise Lever, MD  famotidine (PEPCID) 40 MG tablet Take 1 tablet (40 mg total) by mouth daily. Patient not taking: Reported on 05/12/2016 03/29/16   Cassandria Anger, MD  Guaifenesin 1200 MG TB12 Take 1 tablet (1,200 mg total) by mouth 2 (two) times daily. Patient not taking: Reported on 05/12/2016 03/26/15   Dalia Heading, PA-C  loratadine (CLARITIN) 10 MG tablet Take 10 mg by mouth daily as  needed for allergies.  08/16/10   Evie Lacks Plotnikov, MD  omeprazole (PRILOSEC) 20 MG capsule Take 1 capsule (20 mg total) by mouth daily. Patient not taking: Reported on 05/12/2016 02/14/16   Golden Circle, FNP  Respiratory Therapy Supplies (FLUTTER) DEVI Blow through 4 times per set and repeat 3 sets per day, to loosen lung secretions. Patient not taking: Reported on 05/12/2016 07/16/14   Deneise Lever, MD    Family History Family History  Problem Relation Age of Onset  . Diabetes Father   . Asthma Father   . Allergies Father   . Mental illness Mother     alzheimer's  . Allergies Brother   . Allergies Brother   . Heart disease Maternal Aunt     Social History Social History  Substance Use Topics  . Smoking status: Never Smoker  . Smokeless tobacco: Never Used     Comment: father smoked, worked w/smokers  . Alcohol use No     Allergies   Clindamycin; Fruit & vegetable daily [nutritional supplements]; Penicillins; Shellfish allergy; Sulfonamide derivatives; Aspirin; Montelukast sodium; Bee venom; Latex; Azithromycin; Fluticasone-salmeterol; and Ventolin [albuterol]   Review of Systems Review of Systems 10/14 systems reviewed and are negative other than those stated in the HPI  Physical Exam Updated Vital Signs BP 166/76   Pulse 113   Temp 98 F (36.7 C) (Oral)   Resp 24   Ht 5\' 3"  (1.6 m)   Wt 130 lb (59 kg)   SpO2 96%   BMI 23.03 kg/m   Physical Exam Physical Exam  Nursing note and vitals reviewed. Constitutional: frail appearing, elderly , non-toxic, and in no acute distress Head: Normocephalic and atraumatic.  Mouth/Throat: Oropharynx is clear and dry.  Neck: Normal range of motion. Neck supple.  Cardiovascular: Tachycardic rate and regular rhythm.  no edema Pulmonary/Chest: Tachypnea present. Diffuse inspiratory and expiratory wheezing in all lung fields. Speaks in short sentences, mild accessory muscle usage. Abdominal: Soft. There is no tenderness.  There is no rebound and no guarding.  Musculoskeletal: Normal range of motion.  Neurological: Alert, no facial droop, fluent speech, moves all extremities symmetrically Skin: Skin is warm and dry.  Psychiatric: Cooperative   ED Treatments / Results  Labs (all labs ordered are listed, but only abnormal results are displayed) Labs Reviewed  CBC WITH DIFFERENTIAL/PLATELET - Abnormal; Notable for the following:       Result Value   Hemoglobin 15.1 (*)    All other components within normal limits  COMPREHENSIVE METABOLIC PANEL - Abnormal; Notable for the following:    Chloride 100 (*)    Glucose, Bld 109 (*)    ALT 13 (*)    GFR calc non Af Amer 58 (*)    All other components within normal limits  Randolm Idol, ED    EKG  EKG Interpretation  Date/Time:  Saturday May 12 2016  16:01:16 EST Ventricular Rate:  104 PR Interval:    QRS Duration: 97 QT Interval:  350 QTC Calculation: 461 R Axis:   -42 Text Interpretation:  Sinus tachycardia Right atrial enlargement Left axis deviation Nonspecific T abnormalities, lateral leads SINCE LAST TRACING HEART RATE HAS INCREASED Confirmed by Winfred Leeds  MD, SAM [phone]) on 05/12/2016 4:23:39 PM       Radiology Dg Chest 2 View  Result Date: 05/12/2016 CLINICAL DATA:  Shortness of breath for the past week. History of asthma and hypertension. EXAM: CHEST  2 VIEW COMPARISON:  03/05/2016. FINDINGS: Normal sized heart. Clear lungs. The lungs are mildly hyperexpanded. Aortic arch calcification. Mild thoracic spine degenerative changes. Small right humeral head cysts. Diffuse osteopenia. IMPRESSION: No acute abnormality. Mild changes of COPD. Aortic atherosclerosis. Electronically Signed   By: Claudie Revering M.D.   On: 05/12/2016 15:30    Procedures Procedures (including critical care time)  Medications Ordered in ED Medications  ipratropium-albuterol (DUONEB) 0.5-2.5 (3) MG/3ML nebulizer solution 3 mL (0 mLs Nebulization Hold 05/12/16 1610)    magnesium sulfate IVPB 2 g 50 mL (2 g Intravenous New Bag/Given 05/12/16 1711)  levalbuterol (XOPENEX) nebulizer solution 0.63 mg (not administered)  sodium chloride 0.9 % bolus 1,000 mL (0 mLs Intravenous Stopped 05/12/16 1718)  methylPREDNISolone sodium succinate (SOLU-MEDROL) 125 mg/2 mL injection 125 mg (125 mg Intravenous Given 05/12/16 1600)     Initial Impression / Assessment and Plan / ED Course  I have reviewed the triage vital signs and the nursing notes.  Pertinent labs & imaging results that were available during my care of the patient were reviewed by me and considered in my medical decision making (see chart for details).  Clinical Course      With moderate respiratory distress on presentation. Lungs with poor air movement, Inspiratory and expiratory wheezing. Presentation seems consistent with that of an acute asthma exacerbation. I received a dual neb, Solu-Medrol, magnesium. Symptomatically improved, but still having persistent wheezing and shortness of breath. Discuss potential continuous albuterol, but she states that she cannot handle large persistent doses of albuterol as it makes her tachycardic and jittery. Requesting to have only intermittent breathing treatments. Did give dose of Xopenex here in the ED which she tolerated well. Still having chest tightness although air movement has improved. On room air pulse ox is 90-91%, and she remains on nasal cannula here in the ED. She has responded to treatments although not fully stable yet to be discharged home. We'll plan on admission to the hospital for observation and treatments overnight.  Final Clinical Impressions(s) / ED Diagnoses   Final diagnoses:  Moderate persistent asthma with acute exacerbation    New Prescriptions New Prescriptions   No medications on file     Forde Dandy, MD 05/12/16 1805

## 2016-05-13 DIAGNOSIS — K219 Gastro-esophageal reflux disease without esophagitis: Secondary | ICD-10-CM

## 2016-05-13 DIAGNOSIS — I10 Essential (primary) hypertension: Secondary | ICD-10-CM

## 2016-05-13 DIAGNOSIS — J441 Chronic obstructive pulmonary disease with (acute) exacerbation: Secondary | ICD-10-CM | POA: Diagnosis not present

## 2016-05-13 LAB — BASIC METABOLIC PANEL
ANION GAP: 9 (ref 5–15)
BUN: 8 mg/dL (ref 6–20)
CHLORIDE: 101 mmol/L (ref 101–111)
CO2: 28 mmol/L (ref 22–32)
Calcium: 9 mg/dL (ref 8.9–10.3)
Creatinine, Ser: 0.81 mg/dL (ref 0.44–1.00)
GFR calc Af Amer: >60 mL/min (ref >60)
Glucose, Bld: 181 mg/dL — ABNORMAL HIGH (ref 65–99)
POTASSIUM: 3.6 mmol/L (ref 3.5–5.1)
SODIUM: 138 mmol/L (ref 135–145)

## 2016-05-13 LAB — RESPIRATORY PANEL BY PCR
ADENOVIRUS-RVPPCR: NOT DETECTED
Bordetella pertussis: NOT DETECTED
CHLAMYDOPHILA PNEUMONIAE-RVPPCR: NOT DETECTED
CORONAVIRUS 229E-RVPPCR: NOT DETECTED
Coronavirus HKU1: NOT DETECTED
Coronavirus NL63: NOT DETECTED
Coronavirus OC43: NOT DETECTED
INFLUENZA A-RVPPCR: NOT DETECTED
Influenza B: NOT DETECTED
Metapneumovirus: NOT DETECTED
Mycoplasma pneumoniae: NOT DETECTED
PARAINFLUENZA VIRUS 2-RVPPCR: NOT DETECTED
PARAINFLUENZA VIRUS 3-RVPPCR: NOT DETECTED
PARAINFLUENZA VIRUS 4-RVPPCR: NOT DETECTED
Parainfluenza Virus 1: NOT DETECTED
Respiratory Syncytial Virus: NOT DETECTED
Rhinovirus / Enterovirus: NOT DETECTED

## 2016-05-13 LAB — GLUCOSE, CAPILLARY: GLUCOSE-CAPILLARY: 168 mg/dL — AB (ref 65–99)

## 2016-05-13 MED ORDER — PANTOPRAZOLE SODIUM 40 MG PO TBEC: 40.0000 mg | DELAYED_RELEASE_TABLET | Freq: Every day | ORAL | Status: DC

## 2016-05-13 NOTE — Progress Notes (Signed)
PROGRESS NOTE        PATIENT DETAILS Name: [name] Age: 76 y.o. Sex: female Date of Birth: 01-Feb-1941 Admit Date: 05/12/2016 Admitting Physician Vianne Bulls, MD [id number] Plotnikov, MD  Brief Narrative: Patient is a 76 y.o. female with hx of Asthmatic Bronchitis, chronic hypoxemic resp failure presented to the ED with worsening SOB-found to have Asthma exacerbation and admitted for further evaluation and treatment  Subjective: Feels better-still wheezing-not yet back to her baseline.  Assessment/Plan: Exacerbation of Asthmatic Bronchitis: improved-still wheezing. Moving air well-appears comfortable.Continue IV Solumedrol, bronchodilators. CXR neg for PNA. Resp virus panel negative. Suspect that if improvement continues, patient should be stable to be discharged on 1/15  Hypertension:controlled-continue Diltiazem  GERD:continue PPI  DVT Prophylaxis: Prophylactic Lovenox   Code Status: Full code  Family Communication: None at bedside  Disposition Plan: Remain inpatient-likely home on 1/15  Antimicrobial agents: Anti-infectives    None      Procedures: None  CONSULTS:  None  Time spent: 25- minutes-Greater than 50% of this time was spent in counseling, explanation of diagnosis, planning of further management, and coordination of care.  MEDICATIONS: Scheduled Meds: . diltiazem  120 mg Oral BID  . enoxaparin (LOVENOX) injection  40 mg Subcutaneous Q24H  . methylPREDNISolone (SOLU-MEDROL) injection  60 mg Intravenous Q8H  . mometasone-formoterol  2 puff Inhalation BID  . multivitamin with minerals  1 tablet Oral Daily  . sodium chloride flush  3 mL Intravenous Q12H   Continuous Infusions: PRN Meds:.sodium chloride, acetaminophen, HYDROcodone-acetaminophen, levalbuterol, ondansetron **OR** ondansetron (ZOFRAN) IV, promethazine-codeine, sodium chloride flush   PHYSICAL EXAM: Vital signs: Vitals:   05/13/16 0959 05/13/16 1116  05/13/16 1119 05/13/16 1442  BP:  137/61 137/61 127/64  Pulse:  72  88  Resp:  20  18  Temp:    98.3 F (36.8 C)  TempSrc:    Oral  SpO2: 91% 92%  91%  Weight:      Height:       Filed Weights   05/12/16 1311 05/12/16 2005  Weight: 59 kg (130 lb) 54.7 kg (120 lb 9.6 oz)   Body mass index is 21.36 kg/m.   General appearance :Awake, alert, not in any distress. Speech Clear. Not toxic Looking Eyes:, pupils equally reactive to light and accomodation,no scleral icterus.Pink conjunctiva HEENT: Atraumatic and Normocephalic Neck: supple, no JVD. No cervical lymphadenopathy. No thyromegaly Resp:Good air entry bilaterally, +scattered rhonchi CVS: S1 S2 regular, no murmurs.  GI: Bowel sounds present, Non tender and not distended with no gaurding, rigidity or rebound.No organomegaly Extremities: B/L Lower Ext shows no edema, both legs are warm to touch Neurology:  speech clear,Non focal, sensation is grossly intact. Psychiatric: Normal judgment and insight. Alert and oriented x 3. Normal mood. Musculoskeletal:No digital cyanosis Skin:No Rash, warm and dry Wounds:N/A  I have personally reviewed following labs and imaging studies  LABORATORY DATA: CBC:  Recent Labs Lab 05/12/16 1605  WBC 10.4  NEUTROABS 7.3  HGB 15.1*  HCT 45.3  MCV 89.3  PLT [id number]    Basic Metabolic Panel:  Recent Labs Lab 05/12/16 1605 05/13/16 0345  NA 140 138  K 3.7 3.6  CL 100* 101  CO2 28 28  GLUCOSE 109* 181*  BUN 9 8  CREATININE 0.94 0.81  CALCIUM 9.4 9.0    GFR: Estimated Creatinine Clearance: 49.6 mL/min (by C-G  formula based on SCr of 0.81 mg/dL).  Liver Function Tests:  Recent Labs Lab 05/12/16 1605  AST 20  ALT 13*  ALKPHOS 74  BILITOT 0.5  PROT 7.7  ALBUMIN 3.9   No results for input(s): LIPASE, AMYLASE in the last 168 hours. No results for input(s): AMMONIA in the last 168 hours.  Coagulation Profile: No results for input(s): INR, PROTIME in the last 168  hours.  Cardiac Enzymes: No results for input(s): CKTOTAL, CKMB, CKMBINDEX, TROPONINI in the last 168 hours.  BNP (last 3 results) No results for input(s): PROBNP in the last 8760 hours.  HbA1C: No results for input(s): HGBA1C in the last 72 hours.  CBG:  Recent Labs Lab 05/13/16 0815  GLUCAP 168*    Lipid Profile: No results for input(s): CHOL, HDL, LDLCALC, TRIG, CHOLHDL, LDLDIRECT in the last 72 hours.  Thyroid Function Tests: No results for input(s): TSH, T4TOTAL, FREET4, T3FREE, THYROIDAB in the last 72 hours.  Anemia Panel: No results for input(s): VITAMINB12, FOLATE, FERRITIN, TIBC, IRON, RETICCTPCT in the last 72 hours.  Urine analysis:    Component Value Date/Time   COLORURINE YELLOW 03/29/2016 1050   APPEARANCEUR CLEAR 03/29/2016 1050   LABSPEC 1.025 03/29/2016 1050   PHURINE 5.5 03/29/2016 1050   GLUCOSEU NEGATIVE 03/29/2016 1050   HGBUR NEGATIVE 03/29/2016 1050   BILIRUBINUR NEGATIVE 03/29/2016 1050   KETONESUR NEGATIVE 03/29/2016 1050   PROTEINUR NEGATIVE 07/25/2013 1018   UROBILINOGEN 0.2 03/29/2016 1050   NITRITE NEGATIVE 03/29/2016 1050   LEUKOCYTESUR SMALL (A) 03/29/2016 1050    Sepsis Labs: Lactic Acid, Venous    Component Value Date/Time   LATICACIDVEN 1.42 07/25/2013 0842    MICROBIOLOGY: Recent Results (from the past 240 hour(s))  Respiratory Panel by PCR     Status: None   Collection Time: 05/13/16  6:14 AM  Result Value Ref Range Status   Adenovirus NOT DETECTED NOT DETECTED Final   Coronavirus 229E NOT DETECTED NOT DETECTED Final   Coronavirus HKU1 NOT DETECTED NOT DETECTED Final   Coronavirus NL63 NOT DETECTED NOT DETECTED Final   Coronavirus OC43 NOT DETECTED NOT DETECTED Final   Metapneumovirus NOT DETECTED NOT DETECTED Final   Rhinovirus / Enterovirus NOT DETECTED NOT DETECTED Final   Influenza A NOT DETECTED NOT DETECTED Final   Influenza B NOT DETECTED NOT DETECTED Final   Parainfluenza Virus 1 NOT DETECTED NOT DETECTED  Final   Parainfluenza Virus 2 NOT DETECTED NOT DETECTED Final   Parainfluenza Virus 3 NOT DETECTED NOT DETECTED Final   Parainfluenza Virus 4 NOT DETECTED NOT DETECTED Final   Respiratory Syncytial Virus NOT DETECTED NOT DETECTED Final   Bordetella pertussis NOT DETECTED NOT DETECTED Final   Chlamydophila pneumoniae NOT DETECTED NOT DETECTED Final   Mycoplasma pneumoniae NOT DETECTED NOT DETECTED Final    RADIOLOGY STUDIES/RESULTS: Dg Chest 2 View  Result Date: 05/12/2016 CLINICAL DATA:  Shortness of breath for the past week. History of asthma and hypertension. EXAM: CHEST  2 VIEW COMPARISON:  03/05/2016. FINDINGS: Normal sized heart. Clear lungs. The lungs are mildly hyperexpanded. Aortic arch calcification. Mild thoracic spine degenerative changes. Small right humeral head cysts. Diffuse osteopenia. IMPRESSION: No acute abnormality. Mild changes of COPD. Aortic atherosclerosis. Electronically Signed   By: Claudie Revering M.D.   On: 05/12/2016 15:30     LOS: 0 days   Oren Binet, MD  [facility] Pager:[phone]  If 7PM-7AM, please contact night-coverage [email or web address] Password [facility] 05/13/2016, 4:35 PM

## 2016-05-14 ENCOUNTER — Telehealth: Payer: Self-pay

## 2016-05-14 DIAGNOSIS — K219 Gastro-esophageal reflux disease without esophagitis: Secondary | ICD-10-CM | POA: Diagnosis not present

## 2016-05-14 DIAGNOSIS — J441 Chronic obstructive pulmonary disease with (acute) exacerbation: Secondary | ICD-10-CM | POA: Diagnosis not present

## 2016-05-14 DIAGNOSIS — I10 Essential (primary) hypertension: Secondary | ICD-10-CM | POA: Diagnosis not present

## 2016-05-14 LAB — GLUCOSE, CAPILLARY: Glucose-Capillary: 142 mg/dL — ABNORMAL HIGH (ref 65–99)

## 2016-05-14 MED ORDER — PREDNISONE 10 MG PO TABS: ORAL_TABLET | ORAL | 0 refills | Status: DC

## 2016-05-14 NOTE — Progress Notes (Signed)
Pt discharged home in stable condition after going over discharge instructions with no concerns voiced.AVS and discharge script given before leaving unit.husband provided transportation

## 2016-05-14 NOTE — Discharge Summary (Signed)
PATIENT DETAILS Name: [name] Age: 76 y.o. Sex: female Date of Birth: 1940/09/14 MRN: [id number]. Admitting Physician: Vianne Bulls, MD [id number] Plotnikov, MD  Admit Date: 05/12/2016 Discharge date: 05/14/2016  Recommendations for Outpatient Follow-up:  1. Follow up with PCP in 1-2 weeks 2. Please obtain BMP/CBC in one week 3. Please ensure  follow up with Pulmonolgy  Admitted From:  Home  Disposition: [place]: No  Equipment/Devices: None  Discharge Condition: Stable  CODE STATUS: FULL CODE  Diet recommendation:  Heart Healthy   Brief Summary: See H&P, Labs, Consult and Test reports for all details in brief, Patient is a 76 y.o. female with hx of Asthmatic Bronchitis, chronic hypoxemic resp failure presented to the ED with worsening SOB-found to have Asthma exacerbation and admitted for further evaluation and treatment  Brief Hospital Course: Exacerbation of Asthmatic Bronchitis:Much improved-no longer wheezing, feels that she is back to her usual baseline.  CXR neg for PNA. Resp virus panel negative.-does not require Abx. Since clinically improved, stable to be discharged home with a prednisone taper. Continue usual inhaler regimen, and follow with primary MD and PCCM.  Hypertension:controlled-continue Diltiazem  GERD:continue PPI  Procedures/Studies: None  Discharge Diagnoses:  Principal Problem:   Acute exacerbation of chronic obstructive pulmonary disease (HCC) Active Problems:   Hypertension   Asthma with bronchitis   Bronchiectasis with retained secretions   Acute exacerbation of chronic obstructive pulmonary disease (COPD) ([place])  Discharge Instructions:  Activity:  As tolerated with Full fall precautions use walker/cane & assistance as needed   Discharge Instructions    Call MD for:  difficulty breathing, headache or visual disturbances    Complete by:  As directed    Diet - low sodium heart healthy    Complete by:  As  directed    Discharge instructions    Complete by:  As directed    Follow with Primary MD  Walker Kehr, MD  and Dr Annamaria Boots in 1 week  Please get a complete blood count and chemistry panel checked by your Primary MD at your next visit, and again as instructed by your Primary MD.  Get Medicines reviewed and adjusted: Please take all your medications with you for your next visit with your Primary MD  Laboratory/radiological data: Please request your Primary MD to go over all hospital tests and procedure/radiological results at the follow up, please ask your Primary MD to get all Hospital records sent to his/her office.  In some cases, they will be blood work, cultures and biopsy results pending at the time of your discharge. Please request that your primary care M.D. follows up on these results.  Also Note the following: If you experience worsening of your admission symptoms, develop shortness of breath, life threatening emergency, suicidal or homicidal thoughts you must seek medical attention immediately by calling 911 or calling your MD immediately  if symptoms less severe.  You must read complete instructions/literature along with all the possible adverse reactions/side effects for all the Medicines you take and that have been prescribed to you. Take any new Medicines after you have completely understood and accpet all the possible adverse reactions/side effects.   Do not drive when taking Pain medications or sleeping medications (Benzodaizepines)  Do not take more than prescribed Pain, Sleep and Anxiety Medications. It is not advisable to combine anxiety,sleep and pain medications without talking with your primary care practitioner  Special Instructions: If you have smoked or chewed Tobacco  in the last  2 yrs please stop smoking, stop any regular Alcohol  and or any Recreational drug use.  Wear Seat belts while driving.  Please note: You were cared for by a hospitalist during your  hospital stay. Once you are discharged, your primary care physician will handle any further medical issues. Please note that NO REFILLS for any discharge medications will be authorized once you are discharged, as it is imperative that you return to your primary care physician (or establish a relationship with a primary care physician if you do not have one) for your post hospital discharge needs so that they can reassess your need for medications and monitor your lab values.   Increase activity slowly    Complete by:  As directed      Allergies as of 05/14/2016      Reactions   Clindamycin Other (See Comments)   REACTION: Neck, tongue swelling, SOB \\T \ rash   Fruit & Vegetable Daily [nutritional Supplements] Other (See Comments)   Tongue swelling, vomiting   Penicillins Swelling   Shellfish Allergy Anaphylaxis   Sulfonamide Derivatives Swelling   Throat swelling   Aspirin Other (See Comments)   States stomach bubbles, becomes gaseous and irritated   Montelukast Sodium Other (See Comments)   REACTION: hallucination   Bee Venom Other (See Comments)   Unknown   Latex Itching, Swelling   Azithromycin Other (See Comments)   "makes me gag"   Fluticasone-salmeterol Other (See Comments)   REACTION: hoarseness   Ventolin [albuterol] Other (See Comments)   cough      Medication List    TAKE these medications   acetaminophen 325 MG tablet Commonly known as:  TYLENOL Take 325 mg by mouth every 6 (six) hours as needed for moderate pain or headache.   albuterol 108 (90 Base) MCG/ACT inhaler Commonly known as:  PROAIR HFA Inhale 2 puffs into the lungs every 4 (four) hours as needed for wheezing or shortness of breath. For shortness of breath   albuterol (2.5 MG/3ML) 0.083% nebulizer solution Commonly known as:  PROVENTIL Take 3 mLs (2.5 mg total) by nebulization every 4 (four) hours as needed for wheezing or shortness of breath.   diltiazem 120 MG tablet Commonly known as:   CARDIZEM take 1 tablet by mouth twice a day   famotidine 40 MG tablet Commonly known as:  PEPCID Take 1 tablet (40 mg total) by mouth daily.   FLUTTER Devi Blow through 4 times per set and repeat 3 sets per day, to loosen lung secretions.   Guaifenesin 1200 MG Tb12 Take 1 tablet (1,200 mg total) by mouth 2 (two) times daily.   loratadine 10 MG tablet Commonly known as:  CLARITIN Take 10 mg by mouth daily as needed for allergies.   multivitamin tablet Take 1 tablet by mouth daily.   omeprazole 20 MG capsule Commonly known as:  PRILOSEC Take 1 capsule (20 mg total) by mouth daily.   predniSONE 10 MG tablet Commonly known as:  DELTASONE Take 4 tablets (40 mg) daily for 2 days, then, Take 3 tablets (30 mg) daily for 2 days, then, Take 2 tablets (20 mg) daily for 2 days, then, Take 1 tablets (10 mg) daily for 1 days, then stop   promethazine-codeine 6.25-10 MG/5ML syrup Commonly known as:  PHENERGAN with CODEINE Take 5 mLs by mouth every 6 (six) hours as needed for cough.   SYMBICORT 160-4.5 MCG/ACT inhaler Generic drug:  budesonide-formoterol inhale 2 puffs by mouth twice a day   VITAMIN  B COMPLEX PO Take 1 tablet by mouth daily as needed (takes when she remembers to take it).   VITAMIN C PO Take 1 tablet by mouth daily.      Follow-up Information    Walker Kehr, MD. Schedule an appointment as soon as possible for a visit in 1 week(s).   Specialty:  Internal Medicine Contact information: [address] [address] [phone]        Deneise Lever, MD. Schedule an appointment as soon as possible for a visit in 1 week(s).   Specialty:  Pulmonary Disease Contact information: [place] [address] [phone]          Allergies  Allergen Reactions  . Clindamycin Other (See Comments)    REACTION: Neck, tongue swelling, SOB \\T \ rash  . Fruit & Vegetable Daily [Nutritional Supplements] Other (See Comments)    Tongue swelling,  vomiting  . Penicillins Swelling  . Shellfish Allergy Anaphylaxis  . Sulfonamide Derivatives Swelling    Throat swelling  . Aspirin Other (See Comments)    States stomach bubbles, becomes gaseous and irritated  . Montelukast Sodium Other (See Comments)    REACTION: hallucination  . Bee Venom Other (See Comments)    Unknown  . Latex Itching and Swelling  . Azithromycin Other (See Comments)    "makes me gag"  . Fluticasone-Salmeterol Other (See Comments)    REACTION: hoarseness  . Ventolin [Albuterol] Other (See Comments)    cough    Consultations:   None   Other Procedures/Studies: Dg Chest 2 View  Result Date: 05/12/2016 CLINICAL DATA:  Shortness of breath for the past week. History of asthma and hypertension. EXAM: CHEST  2 VIEW COMPARISON:  03/05/2016. FINDINGS: Normal sized heart. Clear lungs. The lungs are mildly hyperexpanded. Aortic arch calcification. Mild thoracic spine degenerative changes. Small right humeral head cysts. Diffuse osteopenia. IMPRESSION: No acute abnormality. Mild changes of COPD. Aortic atherosclerosis. Electronically Signed   By: Claudie Revering M.D.   On: 05/12/2016 15:30      TODAY-DAY OF DISCHARGE:  Subjective:   [name] today has no headache,no chest abdominal pain,no new weakness tingling or numbness, feels much better wants to go home today.   Objective:   Blood pressure 126/61, pulse 64, temperature 98 F (36.7 C), resp. rate 17, height 5\' 3"  (1.6 m), weight 54.7 kg (120 lb 9.6 oz), SpO2 94 %.  Intake/Output Summary (Last 24 hours) at 05/14/16 0742 Last data filed at 05/14/16 0645  Gross per 24 hour  Intake             1380 ml  Output             1950 ml  Net             -570 ml   Filed Weights   05/12/16 1311 05/12/16 2005  Weight: 59 kg (130 lb) 54.7 kg (120 lb 9.6 oz)    Exam: Awake Alert, Oriented *3, No new F.N deficits, Normal affect [place].AT,PERRAL Supple Neck,No JVD, No cervical lymphadenopathy appriciated.   Symmetrical Chest wall movement, Good air movement bilaterally, CTAB RRR,No Gallops,Rubs or new Murmurs, No Parasternal Heave +ve B.Sounds, Abd Soft, Non tender, No organomegaly appriciated, No rebound -guarding or rigidity. No Cyanosis, Clubbing or edema, No new Rash or bruise   PERTINENT RADIOLOGIC STUDIES: Dg Chest 2 View  Result Date: 05/12/2016 CLINICAL DATA:  Shortness of breath for the past week. History of asthma and hypertension. EXAM: CHEST  2 VIEW COMPARISON:  03/05/2016. FINDINGS: Normal sized heart. Clear lungs. The lungs are mildly hyperexpanded. Aortic arch calcification. Mild thoracic spine degenerative changes. Small right humeral head cysts. Diffuse osteopenia. IMPRESSION: No acute abnormality. Mild changes of COPD. Aortic atherosclerosis. Electronically Signed   By: Claudie Revering M.D.   On: 05/12/2016 15:30     PERTINENT LAB RESULTS: CBC:  Recent Labs  05/12/16 1605  WBC 10.4  HGB 15.1*  HCT 45.3  PLT 332   CMET CMP     Component Value Date/Time   NA 138 05/13/2016 0345   K 3.6 05/13/2016 0345   CL 101 05/13/2016 0345   CO2 28 05/13/2016 0345   GLUCOSE 181 (H) 05/13/2016 0345   BUN 8 05/13/2016 0345   CREATININE 0.81 05/13/2016 0345   CALCIUM 9.0 05/13/2016 0345   PROT 7.7 05/12/2016 1605   ALBUMIN 3.9 05/12/2016 1605   AST 20 05/12/2016 1605   ALT 13 (L) 05/12/2016 1605   ALKPHOS 74 05/12/2016 1605   BILITOT 0.5 05/12/2016 1605   GFRNONAA >60 05/13/2016 0345   GFRAA >60 05/13/2016 0345    GFR Estimated Creatinine Clearance: 49.6 mL/min (by C-G formula based on SCr of 0.81 mg/dL). No results for input(s): LIPASE, AMYLASE in the last 72 hours. No results for input(s): CKTOTAL, CKMB, CKMBINDEX, TROPONINI in the last 72 hours. Invalid input(s): POCBNP No results for input(s): DDIMER in the last 72 hours. No results for input(s): HGBA1C in the last 72 hours. No results for input(s): CHOL, HDL, LDLCALC, TRIG, CHOLHDL, LDLDIRECT in the last 72  hours. No results for input(s): TSH, T4TOTAL, T3FREE, THYROIDAB in the last 72 hours.  Invalid input(s): FREET3 No results for input(s): VITAMINB12, FOLATE, FERRITIN, TIBC, IRON, RETICCTPCT in the last 72 hours. Coags: No results for input(s): INR in the last 72 hours.  Invalid input(s): PT Microbiology: Recent Results (from the past 240 hour(s))  Respiratory Panel by PCR     Status: None   Collection Time: 05/13/16  6:14 AM  Result Value Ref Range Status   Adenovirus NOT DETECTED NOT DETECTED Final   Coronavirus 229E NOT DETECTED NOT DETECTED Final   Coronavirus HKU1 NOT DETECTED NOT DETECTED Final   Coronavirus NL63 NOT DETECTED NOT DETECTED Final   Coronavirus OC43 NOT DETECTED NOT DETECTED Final   Metapneumovirus NOT DETECTED NOT DETECTED Final   Rhinovirus / Enterovirus NOT DETECTED NOT DETECTED Final   Influenza A NOT DETECTED NOT DETECTED Final   Influenza B NOT DETECTED NOT DETECTED Final   Parainfluenza Virus 1 NOT DETECTED NOT DETECTED Final   Parainfluenza Virus 2 NOT DETECTED NOT DETECTED Final   Parainfluenza Virus 3 NOT DETECTED NOT DETECTED Final   Parainfluenza Virus 4 NOT DETECTED NOT DETECTED Final   Respiratory Syncytial Virus NOT DETECTED NOT DETECTED Final   Bordetella pertussis NOT DETECTED NOT DETECTED Final   Chlamydophila pneumoniae NOT DETECTED NOT DETECTED Final   Mycoplasma pneumoniae NOT DETECTED NOT DETECTED Final    FURTHER DISCHARGE INSTRUCTIONS:  Get Medicines reviewed and adjusted: Please take all your medications with you for your next visit with your Primary MD  Laboratory/radiological data: Please request your Primary MD to go over all hospital tests and procedure/radiological results at the follow up, please ask your Primary MD to get all Hospital records sent to his/her office.  In some cases, they will be blood work, cultures and biopsy results pending at the time of your discharge. Please request that your primary care M.D. goes  through  all the records of your hospital data and follows up on these results.  Also Note the following: If you experience worsening of your admission symptoms, develop shortness of breath, life threatening emergency, suicidal or homicidal thoughts you must seek medical attention immediately by calling 911 or calling your MD immediately  if symptoms less severe.  You must read complete instructions/literature along with all the possible adverse reactions/side effects for all the Medicines you take and that have been prescribed to you. Take any new Medicines after you have completely understood and accpet all the possible adverse reactions/side effects.   Do not drive when taking Pain medications or sleeping medications (Benzodaizepines)  Do not take more than prescribed Pain, Sleep and Anxiety Medications. It is not advisable to combine anxiety,sleep and pain medications without talking with your primary care practitioner  Special Instructions: If you have smoked or chewed Tobacco  in the last 2 yrs please stop smoking, stop any regular Alcohol  and or any Recreational drug use.  Wear Seat belts while driving.  Please note: You were cared for by a hospitalist during your hospital stay. Once you are discharged, your primary care physician will handle any further medical issues. Please note that NO REFILLS for any discharge medications will be authorized once you are discharged, as it is imperative that you return to your primary care physician (or establish a relationship with a primary care physician if you do not have one) for your post hospital discharge needs so that they can reassess your need for medications and monitor your lab values.  Total Time spent coordinating discharge including counseling, education and face to face time equals 25 minutes.  SignedOren Binet 05/14/2016 7:42 AM

## 2016-05-14 NOTE — Telephone Encounter (Signed)
Pt is on TCM list.  DC'ed on 05/14/2016 for moderat/persistent asthma with acute exacerbation.   LVM for pt to call back as soon as possible.

## 2016-05-24 DIAGNOSIS — J410 Simple chronic bronchitis: Secondary | ICD-10-CM | POA: Diagnosis not present

## 2016-06-05 DIAGNOSIS — J471 Bronchiectasis with (acute) exacerbation: Secondary | ICD-10-CM | POA: Diagnosis not present

## 2016-06-05 DIAGNOSIS — J410 Simple chronic bronchitis: Secondary | ICD-10-CM | POA: Diagnosis not present

## 2016-06-12 ENCOUNTER — Ambulatory Visit (INDEPENDENT_AMBULATORY_CARE_PROVIDER_SITE_OTHER): Payer: Medicare Other | Admitting: Internal Medicine

## 2016-06-12 ENCOUNTER — Encounter: Payer: Self-pay | Admitting: Internal Medicine

## 2016-06-12 VITALS — BP 118/70 | HR 84 | Temp 97.9°F | Resp 16 | Ht 63.0 in | Wt 134.1 lb

## 2016-06-12 DIAGNOSIS — K5900 Constipation, unspecified: Secondary | ICD-10-CM | POA: Diagnosis not present

## 2016-06-12 DIAGNOSIS — R05 Cough: Secondary | ICD-10-CM | POA: Diagnosis not present

## 2016-06-12 DIAGNOSIS — J069 Acute upper respiratory infection, unspecified: Secondary | ICD-10-CM | POA: Diagnosis not present

## 2016-06-12 DIAGNOSIS — R059 Cough, unspecified: Secondary | ICD-10-CM

## 2016-06-12 DIAGNOSIS — R7303 Prediabetes: Secondary | ICD-10-CM | POA: Diagnosis not present

## 2016-06-12 LAB — GLUCOSE, POCT (MANUAL RESULT ENTRY): POC GLUCOSE: 92 mg/dL (ref 70–99)

## 2016-06-12 MED ORDER — AZITHROMYCIN 250 MG PO TABS: ORAL_TABLET | ORAL | 0 refills | Status: DC

## 2016-06-12 MED ORDER — HYDROCOD POLST-CPM POLST ER 10-8 MG/5ML PO SUER: 5.0000 mL | Freq: Two times a day (BID) | ORAL | 0 refills | Status: DC | PRN

## 2016-06-12 NOTE — Progress Notes (Signed)
Subjective:  Patient ID: [name], female    DOB: [birth year]  Age: 76 y.o. MRN: [id number]  CC: Diabetes (possible, thirsty, ) and Chest Pain (right side, nagging, coughing makes better, constipation )   HPI [name] presents for asthma, depression, HTN f/u. C/o cough. F/u high sugar 1 mo ago C/o cough and heaviness under R chest x 1 wk, constipated...  Outpatient Medications Prior to Visit  Medication Sig Dispense Refill  . acetaminophen (TYLENOL) 325 MG tablet Take 325 mg by mouth every 6 (six) hours as needed for moderate pain or headache.     . albuterol (PROAIR HFA) 108 (90 Base) MCG/ACT inhaler Inhale 2 puffs into the lungs every 4 (four) hours as needed for wheezing or shortness of breath. For shortness of breath 1 Inhaler 11  . albuterol (PROVENTIL) (2.5 MG/3ML) 0.083% nebulizer solution Take 3 mLs (2.5 mg total) by nebulization every 4 (four) hours as needed for wheezing or shortness of breath. 75 mL 5  . Ascorbic Acid (VITAMIN C PO) Take 1 tablet by mouth daily.     . B Complex Vitamins (VITAMIN B COMPLEX PO) Take 1 tablet by mouth daily as needed (takes when she remembers to take it).     Marland Kitchen diltiazem (CARDIZEM) 120 MG tablet take 1 tablet by mouth twice a day 60 tablet 11  . Guaifenesin 1200 MG TB12 Take 1 tablet (1,200 mg total) by mouth 2 (two) times daily. 20 each 0  . loratadine (CLARITIN) 10 MG tablet Take 10 mg by mouth daily as needed for allergies.     . Multiple Vitamin (MULTIVITAMIN) tablet Take 1 tablet by mouth daily.      . promethazine-codeine (PHENERGAN WITH CODEINE) 6.25-10 MG/5ML syrup Take 5 mLs by mouth every 6 (six) hours as needed for cough. 200 mL 0  . Respiratory Therapy Supplies (FLUTTER) DEVI Blow through 4 times per set and repeat 3 sets per day, to loosen lung secretions. 1 each 0  . SYMBICORT 160-4.5 MCG/ACT inhaler inhale 2 puffs by mouth twice a day 10.2 g 5  . famotidine (PEPCID) 40 MG tablet Take 1 tablet (40 mg total) by mouth daily. 30  tablet 11  . omeprazole (PRILOSEC) 20 MG capsule Take 1 capsule (20 mg total) by mouth daily. 30 capsule 3  . predniSONE (DELTASONE) 10 MG tablet Take 4 tablets (40 mg) daily for 2 days, then, Take 3 tablets (30 mg) daily for 2 days, then, Take 2 tablets (20 mg) daily for 2 days, then, Take 1 tablets (10 mg) daily for 1 days, then stop 19 tablet 0   No facility-administered medications prior to visit.     ROS Review of Systems  Constitutional: Negative for activity change, appetite change, chills, fatigue and unexpected weight change.  HENT: Negative for congestion, mouth sores and sinus pressure.   Eyes: Negative for visual disturbance.  Respiratory: Positive for cough. Negative for chest tightness.   Gastrointestinal: Positive for constipation. Negative for abdominal pain and nausea.  Genitourinary: Negative for difficulty urinating, frequency and vaginal pain.  Musculoskeletal: Negative for back pain and gait problem.  Skin: Negative for pallor and rash.  Neurological: Negative for dizziness, tremors, weakness, numbness and headaches.  Psychiatric/Behavioral: Negative for confusion and sleep disturbance.    Objective:  BP 118/70   Pulse 84   Temp 97.9 F (36.6 C) (Oral)   Resp 16   Ht 5\' 3"  (1.6 m)   Wt 134 lb 1.9 oz (60.8 kg)  SpO2 94%   BMI 23.76 kg/m   BP Readings from Last 3 Encounters:  06/12/16 118/70  05/14/16 126/61  03/29/16 130/80    Wt Readings from Last 3 Encounters:  06/12/16 134 lb 1.9 oz (60.8 kg)  05/12/16 120 lb 9.6 oz (54.7 kg)  03/29/16 134 lb (60.8 kg)    Physical Exam  Constitutional: She appears well-developed. No distress.  HENT:  Head: Normocephalic.  Right Ear: External ear normal.  Left Ear: External ear normal.  Nose: Nose normal.  Mouth/Throat: Oropharynx is clear and moist.  Eyes: Conjunctivae are normal. Pupils are equal, round, and reactive to light. Right eye exhibits no discharge. Left eye exhibits no discharge.  Neck:  Normal range of motion. Neck supple. No JVD present. No tracheal deviation present. No thyromegaly present.  Cardiovascular: Normal rate, regular rhythm and normal heart sounds.   Pulmonary/Chest: No stridor. No respiratory distress. She has no wheezes.  Abdominal: Soft. Bowel sounds are normal. She exhibits no distension and no mass. There is no tenderness. There is no rebound and no guarding.  Musculoskeletal: She exhibits no edema or tenderness.  Lymphadenopathy:    She has no cervical adenopathy.  Neurological: She displays normal reflexes. No cranial nerve deficit. She exhibits normal muscle tone. Coordination normal.  Skin: No rash noted. No erythema.  Psychiatric: She has a normal mood and affect. Her behavior is normal. Judgment and thought content normal.  B rhonchi  Lab Results  Component Value Date   WBC 10.4 05/12/2016   HGB 15.1 (H) 05/12/2016   HCT 45.3 05/12/2016   PLT 332 05/12/2016   GLUCOSE 181 (H) 05/13/2016   CHOL 219 (H) 03/29/2016   TRIG 111.0 03/29/2016   HDL 79.20 03/29/2016   LDLDIRECT 138.6 01/19/2010   LDLCALC 118 (H) 03/29/2016   ALT 13 (L) 05/12/2016   AST 20 05/12/2016   NA 138 05/13/2016   K 3.6 05/13/2016   CL 101 05/13/2016   CREATININE 0.81 05/13/2016   BUN 8 05/13/2016   CO2 28 05/13/2016   TSH 1.00 03/29/2016   INR 1.02 01/17/2011   HGBA1C (H) 10/31/2008    6.2 (NOTE) The ADA recommends the following therapeutic goal for glycemic control related to Hgb A1c measurement: Goal of therapy: <6.5 Hgb A1c  Reference: American Diabetes Association: Clinical Practice Recommendations 2010, Diabetes Care, 2010, 33: (Suppl  1).    Dg Chest 2 View  Result Date: 05/12/2016 CLINICAL DATA:  Shortness of breath for the past week. History of asthma and hypertension. EXAM: CHEST  2 VIEW COMPARISON:  03/05/2016. FINDINGS: Normal sized heart. Clear lungs. The lungs are mildly hyperexpanded. Aortic arch calcification. Mild thoracic spine degenerative changes.  Small right humeral head cysts. Diffuse osteopenia. IMPRESSION: No acute abnormality. Mild changes of COPD. Aortic atherosclerosis. Electronically Signed   By: Claudie Revering M.D.   On: 05/12/2016 15:30    Assessment & Plan:   Abri was seen today for diabetes and chest pain.  Diagnoses and all orders for this visit:  Prediabetes -     POCT Glucose (CBG)   I have discontinued Ms. Nembhard's omeprazole, famotidine, and predniSONE. I am also having her maintain her acetaminophen, multivitamin, loratadine, B Complex Vitamins (VITAMIN B COMPLEX PO), Ascorbic Acid (VITAMIN C PO), FLUTTER, Guaifenesin, diltiazem, SYMBICORT, albuterol, albuterol, and promethazine-codeine.  No orders of the defined types were placed in this encounter.    Follow-up: No Follow-up on file.  Walker Kehr, MD

## 2016-06-12 NOTE — Assessment & Plan Note (Signed)
?  bronchitis Z pac 

## 2016-06-12 NOTE — Assessment & Plan Note (Signed)
Zpac 

## 2016-06-12 NOTE — Assessment & Plan Note (Signed)
Miralax  Linzess samples

## 2016-06-12 NOTE — Progress Notes (Signed)
Pre-visit discussion using our clinic review tool. No additional management support is needed unless otherwise documented below in the visit note.  

## 2016-06-12 NOTE — Patient Instructions (Signed)
Try Miralax 1 scoop and/or Linzess 145 mcg samples for constipation as needed 1-2 a day

## 2016-06-14 ENCOUNTER — Telehealth: Payer: Self-pay | Admitting: Internal Medicine

## 2016-06-14 NOTE — Telephone Encounter (Signed)
Patient Name: [name] DOB: February 24, 1941 Initial Comment Caller States she has some questions about if she should continue taking a medication, she is having heavy breathing Nurse Assessment Nurse: Ronnald Ramp, RN, Miranda Date/Time (Eastern Time): 06/14/2016 8:56:09 AM Confirm and document reason for call. If symptomatic, describe symptoms. ---Caller states she was prescribed a z-pac for chest congestion \\cough  and took first dose yesterday. She had diarrhea last night. She has asthma and having some labored breathing/heaviness. Has not taken rescue inhaler. Does the patient have any new or worsening symptoms? ---Yes Will a triage be completed? ---Yes Related visit to physician within the last 2 weeks? ---Yes Does the PT have any chronic conditions? (i.e. diabetes, asthma, etc.) ---Yes List chronic conditions. ---Asthma Is this a behavioral health or substance abuse call? ---No Guidelines Guideline Title Affirmed Question Affirmed Notes Asthma Attack MILD asthma attack (e.g., no SOB at rest, mild SOB with walking, speaks normally in sentences, mild wheezing) (all triage questions negative) Final Disposition User Ashley, RN, Miranda Disagree/Comply: Leta Baptist

## 2016-06-24 DIAGNOSIS — J410 Simple chronic bronchitis: Secondary | ICD-10-CM | POA: Diagnosis not present

## 2016-06-25 ENCOUNTER — Telehealth: Payer: Self-pay | Admitting: Internal Medicine

## 2016-06-25 MED ORDER — PREDNISONE 10 MG PO TABS: ORAL_TABLET | ORAL | 0 refills | Status: DC

## 2016-06-25 NOTE — Telephone Encounter (Signed)
[id number] x1 

## 2016-06-25 NOTE — Telephone Encounter (Signed)
Offer prednisone 10 mg, # 20, 4 X 2 DAYS, 3 X 2 DAYS, 2 X 2 DAYS, 1 X 2 DAYS  

## 2016-06-25 NOTE — Telephone Encounter (Signed)
Pt c/o lots of coughing, and lots wheezing, and increase sob,  Denies chest congestion,fever,chest tigtness. Did a breathing treatment but has no relief   CY-  Please Advise, pt main concern is the lots of wheezing    Allergies  Allergen Reactions  . Clindamycin Other (See Comments)    REACTION: Neck, tongue swelling, SOB \\T \ rash  . Fruit & Vegetable Daily [Nutritional Supplements] Other (See Comments)    Tongue swelling, vomiting  . Penicillins Swelling  . Shellfish Allergy Anaphylaxis  . Sulfonamide Derivatives Swelling    Throat swelling  . Aspirin Other (See Comments)    States stomach bubbles, becomes gaseous and irritated  . Montelukast Sodium Other (See Comments)    REACTION: hallucination  . Bee Venom Other (See Comments)    Unknown  . Latex Itching and Swelling  . Azithromycin Other (See Comments)    "makes me gag"  . Fluticasone-Salmeterol Other (See Comments)    REACTION: hoarseness  . Ventolin [Albuterol] Other (See Comments)    cough

## 2016-06-25 NOTE — Telephone Encounter (Signed)
Pt aware of recs.  rx sent to preferred pharmacy.  Nothing further needed.  

## 2016-07-03 DIAGNOSIS — J410 Simple chronic bronchitis: Secondary | ICD-10-CM | POA: Diagnosis not present

## 2016-07-03 DIAGNOSIS — J471 Bronchiectasis with (acute) exacerbation: Secondary | ICD-10-CM | POA: Diagnosis not present

## 2016-07-22 DIAGNOSIS — J410 Simple chronic bronchitis: Secondary | ICD-10-CM | POA: Diagnosis not present

## 2016-08-03 DIAGNOSIS — J410 Simple chronic bronchitis: Secondary | ICD-10-CM | POA: Diagnosis not present

## 2016-08-03 DIAGNOSIS — J471 Bronchiectasis with (acute) exacerbation: Secondary | ICD-10-CM | POA: Diagnosis not present

## 2016-08-14 ENCOUNTER — Encounter: Payer: Self-pay | Admitting: Adult Health

## 2016-08-14 ENCOUNTER — Ambulatory Visit (INDEPENDENT_AMBULATORY_CARE_PROVIDER_SITE_OTHER): Payer: Medicare Other | Admitting: Adult Health

## 2016-08-14 ENCOUNTER — Telehealth: Payer: Self-pay | Admitting: Internal Medicine

## 2016-08-14 VITALS — BP 146/88 | HR 102 | Temp 98.7°F | Ht 62.0 in | Wt 128.4 lb

## 2016-08-14 DIAGNOSIS — R062 Wheezing: Secondary | ICD-10-CM | POA: Diagnosis not present

## 2016-08-14 DIAGNOSIS — J4541 Moderate persistent asthma with (acute) exacerbation: Secondary | ICD-10-CM | POA: Diagnosis not present

## 2016-08-14 DIAGNOSIS — J441 Chronic obstructive pulmonary disease with (acute) exacerbation: Secondary | ICD-10-CM

## 2016-08-14 MED ORDER — DOXYCYCLINE HYCLATE 100 MG PO TABS: 100.0000 mg | ORAL_TABLET | Freq: Two times a day (BID) | ORAL | 0 refills | Status: DC

## 2016-08-14 MED ORDER — LEVALBUTEROL HCL 0.63 MG/3ML IN NEBU
0.6300 mg | INHALATION_SOLUTION | Freq: Once | RESPIRATORY_TRACT | Status: AC
  Administered 2016-08-14: 0.63 mg via RESPIRATORY_TRACT

## 2016-08-14 MED ORDER — PREDNISONE 10 MG PO TABS: ORAL_TABLET | ORAL | 0 refills | Status: DC

## 2016-08-14 NOTE — Telephone Encounter (Signed)
NP is able to see today. Case discussed with her.
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
## 2016-08-14 NOTE — Telephone Encounter (Signed)
Pt added to TP's schedule for nurse visit Will sign off

## 2016-08-14 NOTE — Patient Instructions (Signed)
Doxycycline 100mg  Twice daily  For 1 week . Take w/ food .  Mucinex DM Twice daily  As needed  Cough/congestion  Prednisone taper over next week .  Follow up Dr. Annamaria Boots  In 2 months and As needed   Please contact office for sooner follow up if symptoms do not improve or worsen or seek emergency care

## 2016-08-14 NOTE — Assessment & Plan Note (Signed)
Exacerbation  xopenex neb x 1   Plan  Patient Instructions  Doxycycline 100mg  Twice daily  For 1 week . Take w/ food .  Mucinex DM Twice daily  As needed  Cough/congestion  Prednisone taper over next week .  Follow up Dr. Annamaria Boots  In 2 months and As needed   Please contact office for sooner follow up if symptoms do not improve or worsen or seek emergency care

## 2016-08-22 DIAGNOSIS — J410 Simple chronic bronchitis: Secondary | ICD-10-CM | POA: Diagnosis not present

## 2016-09-02 DIAGNOSIS — J471 Bronchiectasis with (acute) exacerbation: Secondary | ICD-10-CM | POA: Diagnosis not present

## 2016-09-02 DIAGNOSIS — J410 Simple chronic bronchitis: Secondary | ICD-10-CM | POA: Diagnosis not present

## 2016-09-21 ENCOUNTER — Encounter: Payer: Self-pay | Admitting: Internal Medicine

## 2016-09-21 ENCOUNTER — Telehealth: Payer: Self-pay | Admitting: *Deleted

## 2016-09-21 ENCOUNTER — Ambulatory Visit (INDEPENDENT_AMBULATORY_CARE_PROVIDER_SITE_OTHER)
Admission: RE | Admit: 2016-09-21 | Discharge: 2016-09-21 | Disposition: A | Discharge status: Home / Self Care | Payer: Medicare Other | Source: Ambulatory Visit | Attending: Internal Medicine | Admitting: Internal Medicine

## 2016-09-21 ENCOUNTER — Ambulatory Visit (INDEPENDENT_AMBULATORY_CARE_PROVIDER_SITE_OTHER): Payer: Medicare Other | Admitting: Internal Medicine

## 2016-09-21 VITALS — BP 144/90 | HR 89 | Ht 62.5 in | Wt 125.0 lb

## 2016-09-21 DIAGNOSIS — J441 Chronic obstructive pulmonary disease with (acute) exacerbation: Secondary | ICD-10-CM | POA: Diagnosis not present

## 2016-09-21 DIAGNOSIS — J410 Simple chronic bronchitis: Secondary | ICD-10-CM | POA: Diagnosis not present

## 2016-09-21 DIAGNOSIS — J9611 Chronic respiratory failure with hypoxia: Secondary | ICD-10-CM

## 2016-09-21 DIAGNOSIS — R05 Cough: Secondary | ICD-10-CM | POA: Diagnosis not present

## 2016-09-21 DIAGNOSIS — I10 Essential (primary) hypertension: Secondary | ICD-10-CM

## 2016-09-21 MED ORDER — PREDNISONE 10 MG PO TABS: 10.0000 mg | ORAL_TABLET | Freq: Every day | ORAL | 0 refills | Status: AC

## 2016-09-21 MED ORDER — HYDROCOD POLST-CPM POLST ER 10-8 MG/5ML PO SUER: 5.0000 mL | Freq: Two times a day (BID) | ORAL | 0 refills | Status: DC | PRN

## 2016-09-21 MED ORDER — LEVOFLOXACIN 250 MG PO TABS: 250.0000 mg | ORAL_TABLET | Freq: Every day | ORAL | 0 refills | Status: AC

## 2016-09-21 MED ORDER — METHYLPREDNISOLONE ACETATE 80 MG/ML IJ SUSP
80.0000 mg | Freq: Once | INTRAMUSCULAR | Status: AC
  Administered 2016-09-21: 80 mg via INTRAMUSCULAR

## 2016-09-21 NOTE — Telephone Encounter (Signed)
Left msg on triage stating she is needing rx for some prednisone. Every spring she get to the point where she can;t breathe. Called pt back inform will need appt for evaluation. Made appt w/dr. Jenny Reichmann @ 2:45...[name]
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
## 2016-09-21 NOTE — Patient Instructions (Signed)
You had the steroid shot today  Please take all new medication as prescribed - the prednisone, and the antibiotic  Please continue all other medications as before, and refills have been done if requested - the cough medicine  Please have the pharmacy call with any other refills you may need.  Please keep your appointments with your specialists as you may have planned  Please go to the XRAY Department in the Basement (go straight as you get off the elevator) for the x-ray testing  You will be contacted by phone if any changes need to be made immediately.  Otherwise, you will receive a letter about your results with an explanation, but please check with MyChart first.  Please remember to sign up for MyChart if you have not done so, as this will be important to you in the future with finding out test results, communicating by private email, and scheduling acute appointments online when needed.

## 2016-09-22 DIAGNOSIS — J9611 Chronic respiratory failure with hypoxia: Secondary | ICD-10-CM | POA: Insufficient documentation

## 2016-09-22 NOTE — Assessment & Plan Note (Signed)
Mild elevated, likely reactive,m o/w stable overall by history and exam, recent data reviewed with pt, and pt to continue medical treatment as before,  to f/u any worsening symptoms or concerns' BP Readings from Last 3 Encounters:  09/21/16 (!) 144/90  08/14/16 (!) 146/88  06/12/16 118/70

## 2016-09-22 NOTE — Assessment & Plan Note (Signed)
afeb but with worsening symptoms, no evidence for CHF and doubt PE or other; for depomedrol 80 IM, predpac asd, cough med prn, and empiric levaquin asd, also for cxr - r/o pna

## 2016-09-22 NOTE — Assessment & Plan Note (Signed)
O/w stable, cont home o2 as is, cont to monitor

## 2016-09-26 ENCOUNTER — Telehealth: Payer: Self-pay

## 2016-09-26 ENCOUNTER — Encounter: Payer: Self-pay | Admitting: Internal Medicine

## 2016-09-26 NOTE — Telephone Encounter (Signed)
-----   Message from Biagio Borg, MD sent at 09/26/2016  2:53 PM EDT ----- Letter sent, cont same tx but   The test results show that your current treatment is OK, except there was pneumonia seen on the xray.  You have been treated with an antibiotic, and hopefully you are feeling better.  We do recommend follow up with your PCP [name] at 5-7 days.Redmond Baseman to please inform pt, and help make f/u appt

## 2016-09-26 NOTE — Telephone Encounter (Signed)
Called pt, LVM.   

## 2016-09-26 NOTE — Telephone Encounter (Signed)
Patient called back. I informed of notes, she understood. She states she sees her Pulmonology on June 12. She would like to just follow up with him on this. If she is no better she will see Dr. Alain Marion next week.

## 2016-09-26 NOTE — Telephone Encounter (Signed)
K. Noted.

## 2016-10-03 DIAGNOSIS — J410 Simple chronic bronchitis: Secondary | ICD-10-CM | POA: Diagnosis not present

## 2016-10-03 DIAGNOSIS — J471 Bronchiectasis with (acute) exacerbation: Secondary | ICD-10-CM | POA: Diagnosis not present

## 2016-10-09 ENCOUNTER — Other Ambulatory Visit: Payer: Self-pay

## 2016-10-09 ENCOUNTER — Encounter: Payer: Self-pay | Admitting: Internal Medicine

## 2016-10-09 ENCOUNTER — Ambulatory Visit (INDEPENDENT_AMBULATORY_CARE_PROVIDER_SITE_OTHER)
Admission: RE | Admit: 2016-10-09 | Discharge: 2016-10-09 | Disposition: A | Discharge status: Home / Self Care | Payer: Medicare Other | Source: Ambulatory Visit | Attending: Internal Medicine | Admitting: Internal Medicine

## 2016-10-09 ENCOUNTER — Ambulatory Visit (INDEPENDENT_AMBULATORY_CARE_PROVIDER_SITE_OTHER): Payer: Medicare Other | Admitting: Internal Medicine

## 2016-10-09 VITALS — BP 116/66 | HR 66 | Ht 62.5 in | Wt 128.0 lb

## 2016-10-09 DIAGNOSIS — J449 Chronic obstructive pulmonary disease, unspecified: Secondary | ICD-10-CM | POA: Diagnosis not present

## 2016-10-09 DIAGNOSIS — J181 Lobar pneumonia, unspecified organism: Secondary | ICD-10-CM

## 2016-10-09 DIAGNOSIS — J189 Pneumonia, unspecified organism: Secondary | ICD-10-CM

## 2016-10-09 DIAGNOSIS — J45901 Unspecified asthma with (acute) exacerbation: Secondary | ICD-10-CM

## 2016-10-09 DIAGNOSIS — J479 Bronchiectasis, uncomplicated: Secondary | ICD-10-CM | POA: Diagnosis not present

## 2016-10-09 NOTE — Assessment & Plan Note (Signed)
Chronic problem, waxes and wanes. Continued emphasis on pulmonary toilet. She had not wanted a pneumatic vest but we will keep that idea in mind for the future. Plan-CXR

## 2016-10-09 NOTE — Patient Instructions (Signed)
Order- CXR    Dx bronchiectasis, asthmatic bronchitis  Be careful to stick with your reflux prevention techniques  Please call if we can help

## 2016-10-09 NOTE — Assessment & Plan Note (Signed)
Slowly clearing pneumonia. Now that she admits being aware of reflux, I have to suspect recurrent aspiration as the underlying problem for her bronchiectasis and retained secretions.
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
## 2016-10-22 DIAGNOSIS — J410 Simple chronic bronchitis: Secondary | ICD-10-CM | POA: Diagnosis not present

## 2016-11-02 DIAGNOSIS — J471 Bronchiectasis with (acute) exacerbation: Secondary | ICD-10-CM | POA: Diagnosis not present

## 2016-11-02 DIAGNOSIS — J410 Simple chronic bronchitis: Secondary | ICD-10-CM | POA: Diagnosis not present

## 2016-11-19 ENCOUNTER — Other Ambulatory Visit: Payer: Self-pay | Admitting: Internal Medicine

## 2016-11-21 DIAGNOSIS — J410 Simple chronic bronchitis: Secondary | ICD-10-CM | POA: Diagnosis not present

## 2016-11-22 DIAGNOSIS — H1852 Epithelial (juvenile) corneal dystrophy: Secondary | ICD-10-CM | POA: Diagnosis not present

## 2016-11-22 DIAGNOSIS — H2513 Age-related nuclear cataract, bilateral: Secondary | ICD-10-CM | POA: Diagnosis not present

## 2016-11-22 DIAGNOSIS — H25013 Cortical age-related cataract, bilateral: Secondary | ICD-10-CM | POA: Diagnosis not present

## 2016-11-22 DIAGNOSIS — H40013 Open angle with borderline findings, low risk, bilateral: Secondary | ICD-10-CM | POA: Diagnosis not present

## 2016-12-22 DIAGNOSIS — J410 Simple chronic bronchitis: Secondary | ICD-10-CM | POA: Diagnosis not present

## 2016-12-27 ENCOUNTER — Ambulatory Visit (INDEPENDENT_AMBULATORY_CARE_PROVIDER_SITE_OTHER): Payer: Medicare Other | Admitting: Internal Medicine

## 2016-12-27 ENCOUNTER — Other Ambulatory Visit: Payer: Self-pay | Admitting: Internal Medicine

## 2016-12-27 ENCOUNTER — Encounter: Payer: Self-pay | Admitting: Internal Medicine

## 2016-12-27 ENCOUNTER — Ambulatory Visit (INDEPENDENT_AMBULATORY_CARE_PROVIDER_SITE_OTHER)
Admission: RE | Admit: 2016-12-27 | Discharge: 2016-12-27 | Disposition: A | Discharge status: Home / Self Care | Payer: Medicare Other | Source: Ambulatory Visit | Attending: Internal Medicine | Admitting: Internal Medicine

## 2016-12-27 VITALS — BP 134/82 | HR 93 | Temp 97.9°F | Ht 62.5 in | Wt 128.0 lb

## 2016-12-27 DIAGNOSIS — R05 Cough: Secondary | ICD-10-CM

## 2016-12-27 DIAGNOSIS — J189 Pneumonia, unspecified organism: Secondary | ICD-10-CM

## 2016-12-27 DIAGNOSIS — J181 Lobar pneumonia, unspecified organism: Secondary | ICD-10-CM

## 2016-12-27 DIAGNOSIS — J441 Chronic obstructive pulmonary disease with (acute) exacerbation: Secondary | ICD-10-CM

## 2016-12-27 DIAGNOSIS — I10 Essential (primary) hypertension: Secondary | ICD-10-CM | POA: Diagnosis not present

## 2016-12-27 DIAGNOSIS — R059 Cough, unspecified: Secondary | ICD-10-CM

## 2016-12-27 MED ORDER — PREDNISONE 10 MG PO TABS: ORAL_TABLET | ORAL | 0 refills | Status: DC

## 2016-12-27 MED ORDER — LEVOFLOXACIN 250 MG PO TABS: 250.0000 mg | ORAL_TABLET | Freq: Every day | ORAL | 0 refills | Status: AC

## 2016-12-27 MED ORDER — METHYLPREDNISOLONE ACETATE 80 MG/ML IJ SUSP
80.0000 mg | Freq: Once | INTRAMUSCULAR | Status: AC
  Administered 2016-12-27: 80 mg via INTRAMUSCULAR

## 2016-12-27 MED ORDER — HYDROCOD POLST-CPM POLST ER 10-8 MG/5ML PO SUER: 5.0000 mL | Freq: Two times a day (BID) | ORAL | 0 refills | Status: DC | PRN

## 2016-12-27 NOTE — Progress Notes (Signed)
Subjective:    Patient ID: [name], female    DOB: Jul 15, 1940, 76 y.o.   MRN: [id number]  HPI  Here with acute onset mild to mod 2-3 days ST, HA, general weakness and malaise, with prod cough greenish sputum, but Pt denies chest pain, increased sob or doe, wheezing, orthopnea, PND, increased LE swelling, palpitations, dizziness or syncope, except for onset mild wheezing and sob since last PM.  / Pt denies polydipsia, polyuria,. Pt denies new neurological symptoms such as new headache, or facial or extremity weakness or numbness Past Medical History:  Diagnosis Date  . Allergic rhinitis   . Anxiety   . Asthma   . HTN (hypertension)   . Pneumonia   . Shortness of breath dyspnea    Past Surgical History:  Procedure Laterality Date  . TUBAL LIGATION      reports that she has never smoked. She has never used smokeless tobacco. She reports that she does not drink alcohol or use drugs. family history includes Allergies in her brother, brother, and father; Asthma in her father; Diabetes in her father; Heart disease in her maternal aunt; Mental illness in her mother. Allergies  Allergen Reactions  . Clindamycin Other (See Comments)    REACTION: Neck, tongue swelling, SOB \\T \ rash  . Fruit & Vegetable Daily [Nutritional Supplements] Other (See Comments)    Tongue swelling, vomiting  . Penicillins Swelling  . Shellfish Allergy Anaphylaxis  . Sulfonamide Derivatives Swelling    Throat swelling  . Aspirin Other (See Comments)    States stomach bubbles, becomes gaseous and irritated  . Montelukast Sodium Other (See Comments)    REACTION: hallucination  . Bee Venom Other (See Comments)    Unknown  . Latex Itching and Swelling  . Azithromycin Other (See Comments)    "makes me gag"  . Fluticasone-Salmeterol Other (See Comments)    REACTION: hoarseness  . Ventolin [Albuterol] Other (See Comments)    cough   Current Outpatient Prescriptions on File Prior to Visit  Medication Sig  Dispense Refill  . acetaminophen (TYLENOL) 325 MG tablet Take 325 mg by mouth every 6 (six) hours as needed for moderate pain or headache.     . albuterol (PROAIR HFA) 108 (90 Base) MCG/ACT inhaler Inhale 2 puffs into the lungs every 4 (four) hours as needed for wheezing or shortness of breath. For shortness of breath 1 Inhaler 11  . albuterol (PROVENTIL) (2.5 MG/3ML) 0.083% nebulizer solution Take 3 mLs (2.5 mg total) by nebulization every 4 (four) hours as needed for wheezing or shortness of breath. 75 mL 5  . Ascorbic Acid (VITAMIN C PO) Take 1 tablet by mouth daily.     . B Complex Vitamins (VITAMIN B COMPLEX PO) Take 1 tablet by mouth daily as needed (takes when she remembers to take it).     Marland Kitchen diltiazem (CARDIZEM) 120 MG tablet Take 1 tablet (120 mg total) by mouth 2 (two) times daily. 60 tablet 9  . Guaifenesin 1200 MG TB12 Take 1 tablet (1,200 mg total) by mouth 2 (two) times daily. 20 each 0  . loratadine (CLARITIN) 10 MG tablet Take 10 mg by mouth daily as needed for allergies.     . Multiple Vitamin (MULTIVITAMIN) tablet Take 1 tablet by mouth daily.      Marland Kitchen Respiratory Therapy Supplies (FLUTTER) DEVI Blow through 4 times per set and repeat 3 sets per day, to loosen lung secretions. 1 each 0  . SYMBICORT 160-4.5 MCG/ACT inhaler inhale  2 puffs by mouth twice a day 10.2 g 5   No current facility-administered medications on file prior to visit.    Review of Systems  Constitutional: Negative for other unusual diaphoresis or sweats HENT: Negative for ear discharge or swelling Eyes: Negative for other worsening visual disturbances Respiratory: Negative for stridor or other swelling  Gastrointestinal: Negative for worsening distension or other blood Genitourinary: Negative for retention or other urinary change Musculoskeletal: Negative for other MSK pain or swelling Skin: Negative for color change or other new lesions Neurological: Negative for worsening tremors and other numbness    Psychiatric/Behavioral: Negative for worsening agitation or other fatigue All other system neg per pt    Objective:   Physical Exam BP 134/82   Pulse 93   Temp 97.9 F (36.6 C) (Oral)   Ht 5' 2.5" (1.588 m)   Wt 128 lb (58.1 kg)   SpO2 100%   BMI 23.04 kg/m  VS noted, mild ill Constitutional: Pt appears in NAD HENT: Head: NCAT.  Right Ear: External ear normal.  Left Ear: External ear normal.  Eyes: . Pupils are equal, round, and reactive to light. Conjunctivae and EOM are normal Nose: without d/c or deformity Bilat tm's with mild erythema.  Max sinus areas non tender.  Pharynx with mild erythema, no exudate Neck: Neck supple. Gross normal ROM Cardiovascular: Normal rate and regular rhythm.   Pulmonary/Chest: Effort normal and breath sounds decreased without rales but with mild scattered insp wheezing.  Neurological: Pt is alert. At baseline orientation, motor grossly intact Skin: Skin is warm. No rashes, other new lesions, no LE edema Psychiatric: Pt behavior is normal without agitation  No other exam findings    Assessment & Plan:

## 2016-12-27 NOTE — Patient Instructions (Addendum)
You had the steroid shot today  Please take all new medication as prescribed - the antibiotic, prednisone, and cough medicine if needed  Please continue all other medications as before, and refills have been done if requested.  Please have the pharmacy call with any other refills you may need.  Please keep your appointments with your specialists as you may have planned

## 2016-12-29 NOTE — Assessment & Plan Note (Signed)
Mild to mod, for depomedrol IM, predpac asd, cont inhaler prn, to f/u any worsening symptoms or concerns

## 2016-12-29 NOTE — Assessment & Plan Note (Signed)
Mild to mod, c/w bronchitis vs pna, had cxr recent,, for antibx course, cough med prn, to f/u any worsening symptoms or concerns

## 2016-12-29 NOTE — Assessment & Plan Note (Signed)
stable overall by history and exam, recent data reviewed with pt, and pt to continue medical treatment as before,  to f/u any worsening symptoms or concerns BP Readings from Last 3 Encounters:  12/27/16 134/82  10/09/16 116/66  09/21/16 (!) 144/90

## 2017-01-04 ENCOUNTER — Other Ambulatory Visit: Payer: Self-pay

## 2017-01-04 DIAGNOSIS — J42 Unspecified chronic bronchitis: Secondary | ICD-10-CM

## 2017-01-22 DIAGNOSIS — J410 Simple chronic bronchitis: Secondary | ICD-10-CM | POA: Diagnosis not present

## 2017-01-24 ENCOUNTER — Other Ambulatory Visit: Payer: Self-pay

## 2017-01-28 ENCOUNTER — Telehealth: Payer: Self-pay | Admitting: Emergency Medicine

## 2017-01-28 NOTE — Telephone Encounter (Signed)
[facility] called and asked if patient can get a refill on her SYMBICORT 160-4.5 MCG/ACT inhaler and diltiazem (CARDIZEM) 120 MG tablet. Please advise thanks.

## 2017-01-28 NOTE — Telephone Encounter (Signed)
[facility] is not in the patients pharmacy list, unable to reach patient to see if this is where she wants the medication to go to.

## 2017-02-11 ENCOUNTER — Ambulatory Visit (INDEPENDENT_AMBULATORY_CARE_PROVIDER_SITE_OTHER)
Admission: RE | Admit: 2017-02-11 | Discharge: 2017-02-11 | Disposition: A | Discharge status: Home / Self Care | Payer: Medicare Other | Source: Ambulatory Visit | Attending: Internal Medicine | Admitting: Internal Medicine

## 2017-02-11 DIAGNOSIS — J42 Unspecified chronic bronchitis: Secondary | ICD-10-CM | POA: Diagnosis not present

## 2017-02-11 DIAGNOSIS — R062 Wheezing: Secondary | ICD-10-CM | POA: Diagnosis not present

## 2017-02-12 ENCOUNTER — Encounter: Payer: Self-pay | Admitting: Internal Medicine

## 2017-02-12 ENCOUNTER — Ambulatory Visit (INDEPENDENT_AMBULATORY_CARE_PROVIDER_SITE_OTHER): Payer: Medicare Other | Admitting: Internal Medicine

## 2017-02-12 DIAGNOSIS — K219 Gastro-esophageal reflux disease without esophagitis: Secondary | ICD-10-CM

## 2017-02-12 DIAGNOSIS — J479 Bronchiectasis, uncomplicated: Secondary | ICD-10-CM

## 2017-02-12 NOTE — Assessment & Plan Note (Signed)
Concerning that she recognizes heartburn but is not taking anything currently is an acid blocker or following previously educated reflux precautions. We reviewed these with her. Recommended elevation head of bed.

## 2017-02-12 NOTE — Assessment & Plan Note (Signed)
I reviewed chest x-rays back several years with her. Persistent soft density through the right middle lobe and right hilar areas consistent with previously demonstrated retained secretions. No progressive process otherwise. Plan-reflux precautions. Emphasize use of pneumatic vest to help with airway clearance. Careful discussion of flu vaccine.

## 2017-02-12 NOTE — Patient Instructions (Signed)
I suggest you try using the pneumatic Vest again for at least a couple of days to see if it will help you clear that congested feeling.  We suggested raising the head of your bed- maybe by putting a brick under each of the head legs. This reduces the tendency for stomach acid to slide back up while you sleep.  Try taking an otc acid-blocker like Pepcid once daily before breakfast or before supper, to cut down the acidity in your stomach juice.   Please call if we can help
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
## 2017-02-20 ENCOUNTER — Telehealth: Payer: Self-pay | Admitting: Internal Medicine

## 2017-02-20 MED ORDER — CLARITHROMYCIN 250 MG PO TABS: 250.0000 mg | ORAL_TABLET | Freq: Two times a day (BID) | ORAL | 0 refills | Status: DC

## 2017-02-20 NOTE — Telephone Encounter (Signed)
Offer biaxin 250 mg, # 14, 1 twice daily

## 2017-02-20 NOTE — Telephone Encounter (Signed)
Spoke with patient. She stated that she has had a cough for the past week. It is a productive cough with clear/yellowish phlegm. She is also experiencing increased SOB. She denied any body aches or chest pains, but did state that she will occasionally get chills which is not like her.   She was seen by CY on 02/12/17 and has gotten worse since then.   Patient wishes to use [facility] on [address].   CY, please advise. Thanks!

## 2017-02-20 NOTE — Telephone Encounter (Signed)
Spoke with patient. She is aware of CY's recs. Medication has been called in. Nothing else needed at time of call.

## 2017-02-21 ENCOUNTER — Telehealth: Payer: Self-pay | Admitting: Internal Medicine

## 2017-02-21 DIAGNOSIS — J410 Simple chronic bronchitis: Secondary | ICD-10-CM | POA: Diagnosis not present

## 2017-02-21 MED ORDER — DOXYCYCLINE HYCLATE 100 MG PO TABS: 100.0000 mg | ORAL_TABLET | Freq: Two times a day (BID) | ORAL | 0 refills | Status: DC

## 2017-02-21 NOTE — Telephone Encounter (Signed)
Pt called in yesterday with c/o productive cough with clear/yellowish phlegm. She is also experiencing increased SOB. Pt denies body aches, but did state that she will occasionally get chills. CY prescribed Biaxin 250 bid. Per pt this medication was not available at [facility]. I have spoken to at [facility], who states this medication had to be ordered and is scheduled to be delivered today by 5pm. I spoke with pt and made her aware of this information. Pt states if she has to wait until 5 to start abx, she may have to go to ED. Pt is requesting that CY prescribe a different abx.  CY please advise. Thanks.   Current Outpatient Prescriptions on File Prior to Visit  Medication Sig Dispense Refill  . acetaminophen (TYLENOL) 325 MG tablet Take 325 mg by mouth every 6 (six) hours as needed for moderate pain or headache.     . albuterol (PROAIR HFA) 108 (90 Base) MCG/ACT inhaler Inhale 2 puffs into the lungs every 4 (four) hours as needed for wheezing or shortness of breath. For shortness of breath 1 Inhaler 11  . albuterol (PROVENTIL) (2.5 MG/3ML) 0.083% nebulizer solution Take 3 mLs (2.5 mg total) by nebulization every 4 (four) hours as needed for wheezing or shortness of breath. 75 mL 5  . Ascorbic Acid (VITAMIN C PO) Take 1 tablet by mouth daily.     . B Complex Vitamins (VITAMIN B COMPLEX PO) Take 1 tablet by mouth daily as needed (takes when she remembers to take it).     . chlorpheniramine-HYDROcodone (TUSSIONEX PENNKINETIC ER) 10-8 MG/5ML SUER Take 5 mLs by mouth every 12 (twelve) hours as needed for cough. 115 mL 0  . clarithromycin (BIAXIN) 250 MG tablet Take 1 tablet (250 mg total) by mouth 2 (two) times daily. 14 tablet 0  . diltiazem (CARDIZEM) 120 MG tablet Take 1 tablet (120 mg total) by mouth 2 (two) times daily. 60 tablet 9  . Guaifenesin 1200 MG TB12 Take 1 tablet (1,200 mg total) by mouth 2 (two) times daily. 20 each 0  . loratadine (CLARITIN) 10 MG tablet Take 10 mg by mouth daily as  needed for allergies.     . Multiple Vitamin (MULTIVITAMIN) tablet Take 1 tablet by mouth daily.      . predniSONE (DELTASONE) 10 MG tablet 3 tabs by mouth per day for 3 days,2tabs per day for 3 days,1tab per day for 3 days 18 tablet 0  . Respiratory Therapy Supplies (FLUTTER) DEVI Blow through 4 times per set and repeat 3 sets per day, to loosen lung secretions. 1 each 0  . SYMBICORT 160-4.5 MCG/ACT inhaler inhale 2 puffs by mouth twice a day 10.2 g 5   No current facility-administered medications on file prior to visit.     Allergies  Allergen Reactions  . Clindamycin Other (See Comments)    REACTION: Neck, tongue swelling, SOB \\T \ rash  . Fruit & Vegetable Daily [Nutritional Supplements] Other (See Comments)    Tongue swelling, vomiting  . Penicillins Swelling  . Shellfish Allergy Anaphylaxis  . Sulfonamide Derivatives Swelling    Throat swelling  . Aspirin Other (See Comments)    States stomach bubbles, becomes gaseous and irritated  . Montelukast Sodium Other (See Comments)    REACTION: hallucination  . Bee Venom Other (See Comments)    Unknown  . Latex Itching and Swelling  . Azithromycin Other (See Comments)    "makes me gag"  . Fluticasone-Salmeterol Other (See Comments)  REACTION: hoarseness  . Ventolin [Albuterol] Other (See Comments)    cough

## 2017-02-21 NOTE — Telephone Encounter (Signed)
Ok change script to doxycycline 100 mg, # 20, 1 twice daily

## 2017-02-21 NOTE — Telephone Encounter (Signed)
Called pt and advised message from the provider. Pt understood and verbalized understanding. Nothing further is needed.   Spoke with pt and advised rx sent to pharmacy   

## 2017-02-25 ENCOUNTER — Ambulatory Visit (INDEPENDENT_AMBULATORY_CARE_PROVIDER_SITE_OTHER): Payer: Medicare Other | Admitting: Adult Health

## 2017-02-25 ENCOUNTER — Other Ambulatory Visit: Payer: Self-pay | Admitting: Internal Medicine

## 2017-02-25 ENCOUNTER — Ambulatory Visit (INDEPENDENT_AMBULATORY_CARE_PROVIDER_SITE_OTHER)
Admission: RE | Admit: 2017-02-25 | Discharge: 2017-02-25 | Disposition: A | Discharge status: Home / Self Care | Payer: Medicare Other | Source: Ambulatory Visit | Attending: Adult Health | Admitting: Adult Health

## 2017-02-25 ENCOUNTER — Inpatient Hospital Stay (HOSPITAL_COMMUNITY)
Admission: AD | Admit: 2017-02-25 | Discharge: 2017-02-28 | DRG: 191 | Disposition: A | Discharge status: Home / Self Care | Payer: Medicare Other | Source: Ambulatory Visit | Attending: Internal Medicine | Admitting: Internal Medicine

## 2017-02-25 ENCOUNTER — Telehealth: Payer: Self-pay | Admitting: Internal Medicine

## 2017-02-25 ENCOUNTER — Encounter: Payer: Self-pay | Admitting: Adult Health

## 2017-02-25 DIAGNOSIS — J9611 Chronic respiratory failure with hypoxia: Secondary | ICD-10-CM | POA: Diagnosis present

## 2017-02-25 DIAGNOSIS — J440 Chronic obstructive pulmonary disease with acute lower respiratory infection: Secondary | ICD-10-CM | POA: Diagnosis present

## 2017-02-25 DIAGNOSIS — Z881 Allergy status to other antibiotic agents status: Secondary | ICD-10-CM

## 2017-02-25 DIAGNOSIS — Z8249 Family history of ischemic heart disease and other diseases of the circulatory system: Secondary | ICD-10-CM | POA: Diagnosis not present

## 2017-02-25 DIAGNOSIS — J470 Bronchiectasis with acute lower respiratory infection: Secondary | ICD-10-CM | POA: Diagnosis present

## 2017-02-25 DIAGNOSIS — Z886 Allergy status to analgesic agent status: Secondary | ICD-10-CM

## 2017-02-25 DIAGNOSIS — Z825 Family history of asthma and other chronic lower respiratory diseases: Secondary | ICD-10-CM | POA: Diagnosis not present

## 2017-02-25 DIAGNOSIS — I10 Essential (primary) hypertension: Secondary | ICD-10-CM | POA: Diagnosis not present

## 2017-02-25 DIAGNOSIS — Z880 Allergy status to penicillin: Secondary | ICD-10-CM | POA: Diagnosis not present

## 2017-02-25 DIAGNOSIS — Z833 Family history of diabetes mellitus: Secondary | ICD-10-CM

## 2017-02-25 DIAGNOSIS — Z888 Allergy status to other drugs, medicaments and biological substances status: Secondary | ICD-10-CM

## 2017-02-25 DIAGNOSIS — Z91013 Allergy to seafood: Secondary | ICD-10-CM | POA: Diagnosis not present

## 2017-02-25 DIAGNOSIS — Z91040 Latex allergy status: Secondary | ICD-10-CM

## 2017-02-25 DIAGNOSIS — J479 Bronchiectasis, uncomplicated: Secondary | ICD-10-CM

## 2017-02-25 DIAGNOSIS — R05 Cough: Secondary | ICD-10-CM | POA: Diagnosis present

## 2017-02-25 DIAGNOSIS — Z91030 Bee allergy status: Secondary | ICD-10-CM

## 2017-02-25 DIAGNOSIS — J449 Chronic obstructive pulmonary disease, unspecified: Secondary | ICD-10-CM | POA: Diagnosis not present

## 2017-02-25 DIAGNOSIS — Z882 Allergy status to sulfonamides status: Secondary | ICD-10-CM | POA: Diagnosis not present

## 2017-02-25 DIAGNOSIS — T17990A Other foreign object in respiratory tract, part unspecified in causing asphyxiation, initial encounter: Secondary | ICD-10-CM | POA: Diagnosis present

## 2017-02-25 DIAGNOSIS — J189 Pneumonia, unspecified organism: Secondary | ICD-10-CM | POA: Diagnosis present

## 2017-02-25 DIAGNOSIS — J309 Allergic rhinitis, unspecified: Secondary | ICD-10-CM | POA: Diagnosis present

## 2017-02-25 DIAGNOSIS — J471 Bronchiectasis with (acute) exacerbation: Principal | ICD-10-CM | POA: Diagnosis present

## 2017-02-25 DIAGNOSIS — Z91018 Allergy to other foods: Secondary | ICD-10-CM | POA: Diagnosis not present

## 2017-02-25 DIAGNOSIS — J181 Lobar pneumonia, unspecified organism: Secondary | ICD-10-CM | POA: Diagnosis present

## 2017-02-25 LAB — CBC WITH DIFFERENTIAL/PLATELET
BASOS PCT: 1 %
Basophils Absolute: 0.1 10*3/uL (ref 0.0–0.1)
EOS ABS: 0.4 10*3/uL (ref 0.0–0.7)
Eosinophils Relative: 5 %
HCT: 45.3 % (ref 36.0–46.0)
Hemoglobin: 15.2 g/dL — ABNORMAL HIGH (ref 12.0–15.0)
LYMPHS ABS: 2.6 10*3/uL (ref 0.7–4.0)
Lymphocytes Relative: 30 %
MCH: 30.5 pg (ref 26.0–34.0)
MCHC: 33.6 g/dL (ref 30.0–36.0)
MCV: 91 fL (ref 78.0–100.0)
MONO ABS: 1 10*3/uL (ref 0.1–1.0)
MONOS PCT: 12 %
NEUTROS PCT: 52 %
Neutro Abs: 4.5 10*3/uL (ref 1.7–7.7)
PLATELETS: 330 10*3/uL (ref 150–400)
RBC: 4.98 MIL/uL (ref 3.87–5.11)
RDW: 13.5 % (ref 11.5–15.5)
WBC: 8.6 10*3/uL (ref 4.0–10.5)

## 2017-02-25 LAB — COMPREHENSIVE METABOLIC PANEL
ALK PHOS: 76 U/L (ref 38–126)
ALT: 14 U/L (ref 14–54)
AST: 21 U/L (ref 15–41)
Albumin: 4.1 g/dL (ref 3.5–5.0)
Anion gap: 12 (ref 5–15)
BUN: 11 mg/dL (ref 6–20)
CALCIUM: 9.4 mg/dL (ref 8.9–10.3)
CHLORIDE: 97 mmol/L — AB (ref 101–111)
CO2: 30 mmol/L (ref 22–32)
CREATININE: 0.82 mg/dL (ref 0.44–1.00)
Glucose, Bld: 99 mg/dL (ref 65–99)
Potassium: 3.5 mmol/L (ref 3.5–5.1)
SODIUM: 139 mmol/L (ref 135–145)
Total Bilirubin: 0.8 mg/dL (ref 0.3–1.2)
Total Protein: 8 g/dL (ref 6.5–8.1)

## 2017-02-25 LAB — BRAIN NATRIURETIC PEPTIDE: B NATRIURETIC PEPTIDE 5: 90.9 pg/mL (ref 0.0–100.0)

## 2017-02-25 MED ORDER — LEVOFLOXACIN IN D5W 500 MG/100ML IV SOLN
500.0000 mg | INTRAVENOUS | Status: DC
  Administered 2017-02-25: 500 mg via INTRAVENOUS
  Filled 2017-02-25 (×2): qty 100

## 2017-02-25 MED ORDER — ACETAMINOPHEN 650 MG RE SUPP: 650.0000 mg | Freq: Four times a day (QID) | RECTAL | Status: DC | PRN

## 2017-02-25 MED ORDER — SODIUM CHLORIDE 0.9% FLUSH: 3.0000 mL | INTRAVENOUS | Status: DC | PRN

## 2017-02-25 MED ORDER — DILTIAZEM HCL 60 MG PO TABS
120.0000 mg | ORAL_TABLET | Freq: Two times a day (BID) | ORAL | Status: DC
  Administered 2017-02-25 – 2017-02-28 (×6): 120 mg via ORAL
  Filled 2017-02-25 (×8): qty 2

## 2017-02-25 MED ORDER — ONDANSETRON HCL 4 MG PO TABS: 4.0000 mg | ORAL_TABLET | Freq: Four times a day (QID) | ORAL | Status: DC | PRN

## 2017-02-25 MED ORDER — GUAIFENESIN ER 1200 MG PO TB12: 1.0000 | ORAL_TABLET | Freq: Two times a day (BID) | ORAL | Status: DC

## 2017-02-25 MED ORDER — ALBUTEROL SULFATE (2.5 MG/3ML) 0.083% IN NEBU
2.5000 mg | INHALATION_SOLUTION | Freq: Four times a day (QID) | RESPIRATORY_TRACT | Status: DC
  Administered 2017-02-25 – 2017-02-28 (×9): 2.5 mg via RESPIRATORY_TRACT
  Filled 2017-02-25 (×10): qty 3

## 2017-02-25 MED ORDER — ALBUTEROL SULFATE (2.5 MG/3ML) 0.083% IN NEBU: 2.5000 mg | INHALATION_SOLUTION | RESPIRATORY_TRACT | Status: DC | PRN

## 2017-02-25 MED ORDER — METHYLPREDNISOLONE SODIUM SUCC 40 MG IJ SOLR
40.0000 mg | Freq: Two times a day (BID) | INTRAMUSCULAR | Status: DC
  Administered 2017-02-25 – 2017-02-27 (×4): 40 mg via INTRAVENOUS
  Filled 2017-02-25 (×4): qty 1

## 2017-02-25 MED ORDER — ACETAMINOPHEN 325 MG PO TABS
650.0000 mg | ORAL_TABLET | Freq: Four times a day (QID) | ORAL | Status: DC | PRN
  Filled 2017-02-25: qty 2

## 2017-02-25 MED ORDER — SODIUM CHLORIDE 0.9% FLUSH
3.0000 mL | Freq: Two times a day (BID) | INTRAVENOUS | Status: DC
  Administered 2017-02-26 – 2017-02-28 (×5): 3 mL via INTRAVENOUS

## 2017-02-25 MED ORDER — SENNOSIDES-DOCUSATE SODIUM 8.6-50 MG PO TABS: 1.0000 | ORAL_TABLET | Freq: Every evening | ORAL | Status: DC | PRN

## 2017-02-25 MED ORDER — GUAIFENESIN ER 600 MG PO TB12
1200.0000 mg | ORAL_TABLET | Freq: Two times a day (BID) | ORAL | Status: DC
  Administered 2017-02-25 – 2017-02-28 (×6): 1200 mg via ORAL
  Filled 2017-02-25 (×6): qty 2

## 2017-02-25 MED ORDER — HEPARIN SODIUM (PORCINE) 5000 UNIT/ML IJ SOLN
5000.0000 [IU] | Freq: Three times a day (TID) | INTRAMUSCULAR | Status: DC
  Administered 2017-02-25 – 2017-02-27 (×4): 5000 [IU] via SUBCUTANEOUS
  Filled 2017-02-25 (×6): qty 1

## 2017-02-25 MED ORDER — SODIUM CHLORIDE 0.9 % IV SOLN: 250.0000 mL | INTRAVENOUS | Status: DC | PRN

## 2017-02-25 MED ORDER — PANTOPRAZOLE SODIUM 40 MG PO TBEC
40.0000 mg | DELAYED_RELEASE_TABLET | Freq: Every day | ORAL | Status: DC
  Administered 2017-02-26 – 2017-02-28 (×3): 40 mg via ORAL
  Filled 2017-02-25 (×3): qty 1

## 2017-02-25 MED ORDER — IOPAMIDOL (ISOVUE-300) INJECTION 61%
INTRAVENOUS | Status: AC
  Filled 2017-02-25: qty 75

## 2017-02-25 MED ORDER — ONDANSETRON HCL 4 MG/2ML IJ SOLN: 4.0000 mg | Freq: Four times a day (QID) | INTRAMUSCULAR | Status: DC | PRN

## 2017-02-25 NOTE — Assessment & Plan Note (Signed)
Flare with RML collapse   Admit for further evaluation and tx  See H/P

## 2017-02-25 NOTE — H&P (Signed)
Name: [name] MRN: [id number] DOB: [birth year]    ADMISSION DATE:  02/25/17   CHIEF COMPLAINT:  Cough and wheezing   BRIEF PATIENT DESCRIPTION:  76 year old female never smoker followed for asthma and bronchiectasis admitted from the office with a bronchiectatic exacerbation and possible pneumonia with  right middle lobe collapse on chest x-ray.  SIGNIFICANT EVENTS   STUDIES:  CT chest 10/29>  HISTORY OF PRESENT ILLNESS:   76 year old female never smoker followed for asthma and bronchiectasis Abnormal CT chest, with retained secretion with cystic retention, mass effect, recurrent atelectasis negative workup with bronchoscopy and PET scan in 2012 She is on oxygen 2l/m . Pt complains over the last week that she has had increased cough congestion chest tightness shortness of breath and wheezing.  She has been coughing up thick yellow brown mucus.  She is very weak and is having pain into her back.  Patient says she was too weak to walk into the office today and had to have a wheelchair.  She says her appetite is down.  She has significant nausea.  No vomiting or diarrhea.  Chest x-ray shows worsening right-sided density with a right middle lobe collapse. Patient would be admitted for further evaluation and treatment options.   PAST MEDICAL HISTORY :   has a past medical history of Allergic rhinitis; Anxiety; Asthma; HTN (hypertension); Pneumonia; and Shortness of breath dyspnea.  has a past surgical history that includes Tubal ligation. Prior to Admission medications   Medication Sig Start Date End Date Taking? Authorizing Provider  acetaminophen (TYLENOL) 325 MG tablet Take 325 mg by mouth every 6 (six) hours as needed for moderate pain or headache.     [provider]  albuterol (PROAIR HFA) 108 (90 Base) MCG/ACT inhaler Inhale 2 puffs into the lungs every 4 (four) hours as needed for wheezing or shortness of breath. For shortness of breath 03/05/16   Biagio Borg, MD    albuterol (PROVENTIL) (2.5 MG/3ML) 0.083% nebulizer solution Take 3 mLs (2.5 mg total) by nebulization every 4 (four) hours as needed for wheezing or shortness of breath. 03/05/16   Biagio Borg, MD  Ascorbic Acid (VITAMIN C PO) Take 1 tablet by mouth daily.     [provider]  B Complex Vitamins (VITAMIN B COMPLEX PO) Take 1 tablet by mouth daily as needed (takes when she remembers to take it).     [provider]  chlorpheniramine-HYDROcodone (TUSSIONEX PENNKINETIC ER) 10-8 MG/5ML SUER Take 5 mLs by mouth every 12 (twelve) hours as needed for cough. 12/27/16   Biagio Borg, MD  diltiazem (CARDIZEM) 120 MG tablet Take 1 tablet (120 mg total) by mouth 2 (two) times daily. 12/27/16   Plotnikov, Evie Lacks, MD  doxycycline (VIBRA-TABS) 100 MG tablet Take 1 tablet (100 mg total) by mouth 2 (two) times daily. 02/21/17   Deneise Lever, MD  Guaifenesin 1200 MG TB12 Take 1 tablet (1,200 mg total) by mouth 2 (two) times daily. 03/26/15   Lawyer, Harrell Gave, PA-C  loratadine (CLARITIN) 10 MG tablet Take 10 mg by mouth daily as needed for allergies.  08/16/10   Plotnikov, Evie Lacks, MD  Multiple Vitamin (MULTIVITAMIN) tablet Take 1 tablet by mouth daily.      [provider]  Respiratory Therapy Supplies (FLUTTER) DEVI Blow through 4 times per set and repeat 3 sets per day, to loosen lung secretions. 07/16/14   Deneise Lever, MD  SYMBICORT 160-4.5 MCG/ACT inhaler inhale 2 puffs by  mouth twice a day 11/19/16   Deneise Lever, MD   Allergies  Allergen Reactions  . Clindamycin Other (See Comments)    REACTION: Neck, tongue swelling, SOB \\T \ rash  . Fruit & Vegetable Daily [Nutritional Supplements] Other (See Comments)    Tongue swelling, vomiting  . Penicillins Swelling  . Shellfish Allergy Anaphylaxis  . Sulfonamide Derivatives Swelling    Throat swelling  . Aspirin Other (See Comments)    States stomach bubbles, becomes gaseous and irritated  . Montelukast Sodium Other  (See Comments)    REACTION: hallucination  . Bee Venom Other (See Comments)    Unknown  . Latex Itching and Swelling  . Azithromycin Other (See Comments)    "makes me gag"  . Fluticasone-Salmeterol Other (See Comments)    REACTION: hoarseness  . Ventolin [Albuterol] Other (See Comments)    cough    FAMILY HISTORY:  family history includes Allergies in her brother, brother, and father; Asthma in her father; Diabetes in her father; Heart disease in her maternal aunt; Mental illness in her mother. SOCIAL HISTORY:  reports that she has never smoked. She has never used smokeless tobacco. She reports that she does not drink alcohol or use drugs.  REVIEW OF SYSTEMS:  Constitutional:   No  weight loss, night sweats,  Fevers, chills, + fatigue, or  lassitude.  HEENT:   No headaches,  Difficulty swallowing,  Tooth/dental problems, or  Sore throat,                No sneezing, itching, ear ache, + nasal congestion, post nasal drip,   CV:  No chest pain,  Orthopnea, PND, swelling in lower extremities, anasarca, dizziness, palpitations, syncope.   GI  No heartburn, indigestion, abdominal pain,   vomiting, diarrhea, change in bowel habits, loss of appetite, bloody stools.   Resp:    No chest wall deformity  Skin: no rash or lesions.  GU: no dysuria, change in color of urine, no urgency or frequency.  No flank pain, no hematuria   MS:  No joint pain or swelling.  No decreased range of motion.  No back pain.  Psych:  No change in mood or affect. No depression or anxiety.  No memory loss.      SUBJECTIVE:   VITAL SIGNS: Pulse Rate:  [82] 82 (10/29 1524) BP: (132)/(64) 132/64 (10/29 1524) SpO2:  [95 %] 95 % (10/29 1524) Weight:  [125 lb 3.2 oz (56.8 kg)] 125 lb 3.2 oz (56.8 kg) (10/29 1524)  PHYSICAL EXAMINATION: GEN: A/Ox3; pleasant , NAD, elderly , on O2 , in wc    HEENT:  [place]/AT,  EACs-clear, TMs-wnl, NOSE-clear, THROAT-clear, no lesions, no postnasal drip or exudate noted.    NECK:  Supple w/ fair ROM; no JVD; normal carotid impulses w/o bruits; no thyromegaly or nodules palpated; no lymphadenopathy.    RESP few scattered rhonchi . no accessory muscle use, no dullness to percussion  CARD:  RRR, no m/r/g  , no peripheral edema, pulses intact, no cyanosis or clubbing.  GI:   Soft & nt; nml bowel sounds; no organomegaly or masses detected.   Musco: Warm bil, no deformities or joint swelling noted.   Neuro: alert, no focal deficits noted.    Skin: Warm, no lesions or rashes   No results for input(s): NA, K, CL, CO2, BUN, CREATININE, GLUCOSE in the last 168 hours. No results for input(s): HGB, HCT, WBC, PLT in the last 168 hours. Dg Chest 2 View  Result Date: 02/25/2017 CLINICAL DATA:  Right posterior back pain. History of hypertension and asthma. EXAM: CHEST  2 VIEW COMPARISON:  Chest x-ray of February 11, 2017 and May 12, 2016. FINDINGS: Further increased density is developed in the right infrahilar region which corresponds to right middle lobe collapse today. The right upper lobe and lower lobe are clear. The left lung is clear. The heart and mediastinal structures are normal. There is calcification in the wall of the aortic arch. There is no pleural effusion or pneumothorax. The bony thorax exhibits no acute abnormality. IMPRESSION: Hyperinflation consistent with known reactive airway disease. Right middle lobe collapse likely secondary to a central obstructing lesion or less likely mucous plugging. Given the worsening chest x-ray appearance, chest CT scanning is recommended. Thoracic aortic atherosclerosis. Electronically Signed   By: David  Martinique M.D.   On: 02/25/2017 14:48    ASSESSMENT / PLAN: 1. Acute Bronchiectatic Exacerbation with possible post obstruction PNA /RML collapse.  Will need CT chest . Begin IV abx . Increase pulmonary hygiene regimen with flutter and nebs.   Plan  Admit to [facility] med surg bed  Begin Levaquin (mulitple allergies )   Begin Albuterol Neb every 6hr .  Solumedrol 40mg  q12 IV  Continue on Symbicort  Add Flutter valve  Check CT chest w/ contrast .  Sputum cx .    2. Chronic hypoxic Respiratory Failure   Plan  O2 to keep sats >90% .          Rexene Edison NP -C  Pulmonary and [place] Pager: [phone]  Reviewed and agree  02/25/2017, 4:12 PM

## 2017-02-25 NOTE — Patient Instructions (Signed)
Admit  

## 2017-02-25 NOTE — Telephone Encounter (Signed)
Called spoke with patient - appt scheduled w/ TP at 3pm, pt aware to arrive early for cxr prior to visit.  Order placed STAT.  Pt aware to call the office or seek emergency care if needed for worsening symptoms prior to appt.  Nothing further needed; will sign off.

## 2017-02-25 NOTE — Progress Notes (Signed)
@Patient  ID: [name], female    DOB: [birth year], 76 y.o.   MRN: [id number]  Chief Complaint  Patient presents with  . Acute Visit    cough     Referring provider: Cassandria Anger, MD  HPI: 76 year old female never smoker followed for asthma and bronchiectasis Abnormal CT chest, with retained secretion with cystic retention, mass effect, recurrent atelectasis negative workup with bronchoscopy and PET scan in 2012 On oxygen 2l/m .    02/25/2017 Acute OV : Cough  Patient presents for an acute office visit.  She complains of 1 week of cough , congestion , tightness, sob, wheezing . Coughing up thick yellow brown mucus. Says she is very weak and is having pain into back . Chest x-ray shows worsening right-sided density with right middle lobe collapse.Appetite is down but no v/d.  She has nausea.  Too weak to walk in today , brought in by wheelchair.  She denies hemoptysis , fever, chest pain or edema.  Remains on symbicort Twice daily  .      Allergies  Allergen Reactions  . Clindamycin Other (See Comments)    REACTION: Neck, tongue swelling, SOB \\T \ rash  . Fruit & Vegetable Daily [Nutritional Supplements] Other (See Comments)    Tongue swelling, vomiting  . Penicillins Swelling  . Shellfish Allergy Anaphylaxis  . Sulfonamide Derivatives Swelling    Throat swelling  . Aspirin Other (See Comments)    States stomach bubbles, becomes gaseous and irritated  . Montelukast Sodium Other (See Comments)    REACTION: hallucination  . Bee Venom Other (See Comments)    Unknown  . Latex Itching and Swelling  . Azithromycin Other (See Comments)    "makes me gag"  . Fluticasone-Salmeterol Other (See Comments)    REACTION: hoarseness  . Ventolin [Albuterol] Other (See Comments)    cough    Immunization History  Administered Date(s) Administered  . Influenza Split 04/30/2010  . Influenza Whole 01/19/2009  . Pneumococcal Conjugate-13 04/10/2013  . Pneumococcal  Polysaccharide-23 04/30/2006    Past Medical History:  Diagnosis Date  . Allergic rhinitis   . Anxiety   . Asthma   . HTN (hypertension)   . Pneumonia   . Shortness of breath dyspnea     Tobacco History: History  Smoking Status  . Never Smoker  Smokeless Tobacco  . Never Used    Comment: father smoked, worked w/smokers   Counseling given: Not Answered   Outpatient Encounter Prescriptions as of 02/25/2017  Medication Sig  . acetaminophen (TYLENOL) 325 MG tablet Take 325 mg by mouth every 6 (six) hours as needed for moderate pain or headache.   . albuterol (PROAIR HFA) 108 (90 Base) MCG/ACT inhaler Inhale 2 puffs into the lungs every 4 (four) hours as needed for wheezing or shortness of breath. For shortness of breath  . albuterol (PROVENTIL) (2.5 MG/3ML) 0.083% nebulizer solution Take 3 mLs (2.5 mg total) by nebulization every 4 (four) hours as needed for wheezing or shortness of breath.  . Ascorbic Acid (VITAMIN C PO) Take 1 tablet by mouth daily.   . B Complex Vitamins (VITAMIN B COMPLEX PO) Take 1 tablet by mouth daily as needed (takes when she remembers to take it).   . chlorpheniramine-HYDROcodone (TUSSIONEX PENNKINETIC ER) 10-8 MG/5ML SUER Take 5 mLs by mouth every 12 (twelve) hours as needed for cough.  . diltiazem (CARDIZEM) 120 MG tablet Take 1 tablet (120 mg total) by mouth 2 (two) times daily.  Marland Kitchen doxycycline (  VIBRA-TABS) 100 MG tablet Take 1 tablet (100 mg total) by mouth 2 (two) times daily.  . Guaifenesin 1200 MG TB12 Take 1 tablet (1,200 mg total) by mouth 2 (two) times daily.  Marland Kitchen loratadine (CLARITIN) 10 MG tablet Take 10 mg by mouth daily as needed for allergies.   . Multiple Vitamin (MULTIVITAMIN) tablet Take 1 tablet by mouth daily.    Marland Kitchen Respiratory Therapy Supplies (FLUTTER) DEVI Blow through 4 times per set and repeat 3 sets per day, to loosen lung secretions.  . SYMBICORT 160-4.5 MCG/ACT inhaler inhale 2 puffs by mouth twice a day  . [DISCONTINUED]  clarithromycin (BIAXIN) 250 MG tablet Take 1 tablet (250 mg total) by mouth 2 (two) times daily. (Patient not taking: Reported on 02/25/2017)  . [DISCONTINUED] predniSONE (DELTASONE) 10 MG tablet 3 tabs by mouth per day for 3 days,2tabs per day for 3 days,1tab per day for 3 days (Patient not taking: Reported on 02/25/2017)   No facility-administered encounter medications on file as of 02/25/2017.      Review of Systems  Constitutional:   No  weight loss, night sweats,  Fevers, chills, + fatigue, or  lassitude.  HEENT:   No headaches,  Difficulty swallowing,  Tooth/dental problems, or  Sore throat,                No sneezing, itching, ear ache,  +nasal congestion, post nasal drip,   CV:  No chest pain,  Orthopnea, PND, swelling in lower extremities, anasarca, dizziness, palpitations, syncope.   GI  No heartburn, indigestion, abdominal pain, nausea, vomiting, diarrhea, change in bowel habits, loss of appetite, bloody stools.   Resp:    No chest wall deformity  Skin: no rash or lesions.  GU: no dysuria, change in color of urine, no urgency or frequency.  No flank pain, no hematuria   MS:  No joint pain or swelling.  No decreased range of motion.  No back pain.    Physical Exam  BP 132/64 (BP Location: Left Arm, Cuff Size: Normal)   Pulse 82   Ht 5\' 2"  (1.575 m)   Wt 125 lb 3.2 oz (56.8 kg)   SpO2 95%   BMI 22.90 kg/m   GEN: A/Ox3; pleasant , NAD, elderly , in wc , on o2    HEENT:  [place]/AT,  EACs-clear, TMs-wnl, NOSE-clear, THROAT-clear, no lesions, no postnasal drip or exudate noted.   NECK:  Supple w/ fair ROM; no JVD; normal carotid impulses w/o bruits; no thyromegaly or nodules palpated; no lymphadenopathy.    RESP  Few rhonchi noted R>L ., . no accessory muscle use, no dullness to percussion  CARD:  RRR, no m/r/g, no peripheral edema, pulses intact, no cyanosis or clubbing.  GI:   Soft & nt; nml bowel sounds; no organomegaly or masses detected.   Musco: Warm bil, no  deformities or joint swelling noted.   Neuro: alert, no focal deficits noted.    Skin: Warm, no lesions or rashes    Lab Results:  CBC   BNP No results found for: BNP  Imaging: Dg Chest 2 View  Result Date: 02/25/2017 CLINICAL DATA:  Right posterior back pain. History of hypertension and asthma. EXAM: CHEST  2 VIEW COMPARISON:  Chest x-ray of February 11, 2017 and May 12, 2016. FINDINGS: Further increased density is developed in the right infrahilar region which corresponds to right middle lobe collapse today. The right upper lobe and lower lobe are clear. The left lung is clear. The  heart and mediastinal structures are normal. There is calcification in the wall of the aortic arch. There is no pleural effusion or pneumothorax. The bony thorax exhibits no acute abnormality. IMPRESSION: Hyperinflation consistent with known reactive airway disease. Right middle lobe collapse likely secondary to a central obstructing lesion or less likely mucous plugging. Given the worsening chest x-ray appearance, chest CT scanning is recommended. Thoracic aortic atherosclerosis. Electronically Signed   By: David  Martinique M.D.   On: 02/25/2017 14:48   Dg Chest 2 View  Result Date: 02/11/2017 CLINICAL DATA:  Wheezing. EXAM: CHEST  2 VIEW COMPARISON:  Chest x-ray dated December 27, 2016. FINDINGS: The cardiomediastinal silhouette is normal in size. Normal pulmonary vascularity. Atherosclerotic calcification of the aortic arch. The lungs remain mildly hyperinflated. Chronic bronchitic changes. Unchanged right infrahilar hazy density. No pleural effusion or pneumothorax. No acute osseous abnormality. IMPRESSION: 1. Unchanged right infrahilar hazy opacity. This is been relatively stable since August 2017. Consider chest CT for further evaluation. 2. Chronic bronchitic changes and hyperinflation. No new acute cardiopulmonary disease. Electronically Signed   By: Titus Dubin M.D.   On: 02/11/2017 16:38      Assessment & Plan:   Bronchiectasis with retained secretions Flare with RML collapse   Admit for further evaluation and tx  See H/P       Rexene Edison, NP 02/25/2017

## 2017-02-25 NOTE — Telephone Encounter (Signed)
She needs to be seen. Suggest CXR before seen. Chronic retained secretions/ bronchiectasis, probable intermittent aspiration.

## 2017-02-25 NOTE — Telephone Encounter (Signed)
Pt states she is not having any improvement with doxycycline that was prescribed on 10/25. Pt reports of cont nausea, dizziness, increased sob & and chest discomfort into middle back.  CY please advise. Thanks.   Current Outpatient Prescriptions on File Prior to Visit  Medication Sig Dispense Refill  . acetaminophen (TYLENOL) 325 MG tablet Take 325 mg by mouth every 6 (six) hours as needed for moderate pain or headache.     . albuterol (PROAIR HFA) 108 (90 Base) MCG/ACT inhaler Inhale 2 puffs into the lungs every 4 (four) hours as needed for wheezing or shortness of breath. For shortness of breath 1 Inhaler 11  . albuterol (PROVENTIL) (2.5 MG/3ML) 0.083% nebulizer solution Take 3 mLs (2.5 mg total) by nebulization every 4 (four) hours as needed for wheezing or shortness of breath. 75 mL 5  . Ascorbic Acid (VITAMIN C PO) Take 1 tablet by mouth daily.     . B Complex Vitamins (VITAMIN B COMPLEX PO) Take 1 tablet by mouth daily as needed (takes when she remembers to take it).     . chlorpheniramine-HYDROcodone (TUSSIONEX PENNKINETIC ER) 10-8 MG/5ML SUER Take 5 mLs by mouth every 12 (twelve) hours as needed for cough. 115 mL 0  . clarithromycin (BIAXIN) 250 MG tablet Take 1 tablet (250 mg total) by mouth 2 (two) times daily. 14 tablet 0  . diltiazem (CARDIZEM) 120 MG tablet Take 1 tablet (120 mg total) by mouth 2 (two) times daily. 60 tablet 9  . doxycycline (VIBRA-TABS) 100 MG tablet Take 1 tablet (100 mg total) by mouth 2 (two) times daily. 20 tablet 0  . Guaifenesin 1200 MG TB12 Take 1 tablet (1,200 mg total) by mouth 2 (two) times daily. 20 each 0  . loratadine (CLARITIN) 10 MG tablet Take 10 mg by mouth daily as needed for allergies.     . Multiple Vitamin (MULTIVITAMIN) tablet Take 1 tablet by mouth daily.      . predniSONE (DELTASONE) 10 MG tablet 3 tabs by mouth per day for 3 days,2tabs per day for 3 days,1tab per day for 3 days 18 tablet 0  . Respiratory Therapy Supplies (FLUTTER) DEVI Blow  through 4 times per set and repeat 3 sets per day, to loosen lung secretions. 1 each 0  . SYMBICORT 160-4.5 MCG/ACT inhaler inhale 2 puffs by mouth twice a day 10.2 g 5   No current facility-administered medications on file prior to visit.     Allergies  Allergen Reactions  . Clindamycin Other (See Comments)    REACTION: Neck, tongue swelling, SOB \\T \ rash  . Fruit & Vegetable Daily [Nutritional Supplements] Other (See Comments)    Tongue swelling, vomiting  . Penicillins Swelling  . Shellfish Allergy Anaphylaxis  . Sulfonamide Derivatives Swelling    Throat swelling  . Aspirin Other (See Comments)    States stomach bubbles, becomes gaseous and irritated  . Montelukast Sodium Other (See Comments)    REACTION: hallucination  . Bee Venom Other (See Comments)    Unknown  . Latex Itching and Swelling  . Azithromycin Other (See Comments)    "makes me gag"  . Fluticasone-Salmeterol Other (See Comments)    REACTION: hoarseness  . Ventolin [Albuterol] Other (See Comments)    cough

## 2017-02-26 ENCOUNTER — Encounter (HOSPITAL_COMMUNITY): Payer: Self-pay

## 2017-02-26 ENCOUNTER — Inpatient Hospital Stay (HOSPITAL_COMMUNITY): Payer: Medicare Other

## 2017-02-26 DIAGNOSIS — J181 Lobar pneumonia, unspecified organism: Secondary | ICD-10-CM

## 2017-02-26 LAB — BASIC METABOLIC PANEL
ANION GAP: 11 (ref 5–15)
BUN: 14 mg/dL (ref 6–20)
CO2: 25 mmol/L (ref 22–32)
Calcium: 8.9 mg/dL (ref 8.9–10.3)
Chloride: 102 mmol/L (ref 101–111)
Creatinine, Ser: 0.83 mg/dL (ref 0.44–1.00)
GFR calc Af Amer: >60 mL/min (ref >60)
Glucose, Bld: 150 mg/dL — ABNORMAL HIGH (ref 65–99)
POTASSIUM: 3.9 mmol/L (ref 3.5–5.1)
SODIUM: 138 mmol/L (ref 135–145)

## 2017-02-26 LAB — URINALYSIS, ROUTINE W REFLEX MICROSCOPIC
Bilirubin Urine: NEGATIVE
Glucose, UA: NEGATIVE mg/dL
HGB URINE DIPSTICK: NEGATIVE
Ketones, ur: NEGATIVE mg/dL
Nitrite: NEGATIVE
PROTEIN: NEGATIVE mg/dL
Specific Gravity, Urine: 1.014 (ref 1.005–1.030)
pH: 5 (ref 5.0–8.0)

## 2017-02-26 LAB — CBC
HEMATOCRIT: 43.5 % (ref 36.0–46.0)
Hemoglobin: 14.2 g/dL (ref 12.0–15.0)
MCH: 29.6 pg (ref 26.0–34.0)
MCHC: 32.6 g/dL (ref 30.0–36.0)
MCV: 90.6 fL (ref 78.0–100.0)
Platelets: 302 10*3/uL (ref 150–400)
RBC: 4.8 MIL/uL (ref 3.87–5.11)
RDW: 13.4 % (ref 11.5–15.5)
WBC: 7.1 10*3/uL (ref 4.0–10.5)

## 2017-02-26 MED ORDER — IOPAMIDOL (ISOVUE-300) INJECTION 61%
75.0000 mL | Freq: Once | INTRAVENOUS | Status: AC | PRN
Start: 2017-02-26 — End: 2017-02-26
  Administered 2017-02-26: 75 mL via INTRAVENOUS

## 2017-02-26 MED ORDER — DEXTROSE 5 % IV SOLN
2.0000 g | Freq: Two times a day (BID) | INTRAVENOUS | Status: DC
  Administered 2017-02-26 – 2017-02-27 (×3): 2 g via INTRAVENOUS
  Filled 2017-02-26 (×3): qty 2

## 2017-02-26 MED ORDER — IOPAMIDOL (ISOVUE-300) INJECTION 61%
INTRAVENOUS | Status: AC
  Filled 2017-02-26: qty 75

## 2017-02-26 NOTE — Progress Notes (Signed)
Pharmacy Antibiotic Note  [name] is a 76 y.o. female admitted on 02/25/2017 with Acute Bronchiectatic Exacerbation with possible post obstruction PNA /RML collapse.  .    Pt with multiple abx allergies and had SOB event 10/29 PM with levaquin dose - see RN note, pt improved after stopping abx, neb treatment, steroids and increased oxygen.  Pharmacy consulted to dose ceftazidime for Acute Bronchiectatic Exacerbation with possible post obstruction PNA /RML collapse. She has taken ceftriaxone in the past per EPIC chart review (07/25/2013).  Pt with anaphylaxis to PCN, sulfa, clinda, possible levaquin.   WBC WNL, AF, Creat 0.83, Creat Cl 46 ml/min.   Plan: Ceftazidime 2 gm IV q12h F/u renal fxn, wbc, temp, culture data F/u tolerance to abx  Height: 5\' 2"  (157.5 cm) Weight: 125 lb 3.2 oz (56.8 kg) IBW/kg (Calculated) : 50.1  Temp (24hrs), Avg:97.9 F (36.6 C), Min:97.7 F (36.5 C), Max:98.1 F (36.7 C)   Recent Labs Lab 02/25/17 1806 02/26/17 0553  WBC 8.6 7.1  CREATININE 0.82 0.83    Estimated Creatinine Clearance: 46.3 mL/min (by C-G formula based on SCr of 0.83 mg/dL).    Allergies  Allergen Reactions  . Aspirin Other (See Comments)    Other reaction(s): Other (See Comments) STOMACH ULCERS. States stomach bubbles, becomes gaseous and irritated  . Clindamycin Other (See Comments)    REACTION: Neck, tongue swelling, SOB \\T \ rash  . Fruit & Vegetable Daily [Nutritional Supplements] Other (See Comments)    Tongue swelling, vomiting  . Levofloxacin Shortness Of Breath  . Penicillin G Anaphylaxis  . Penicillins Swelling  . Shellfish Allergy Anaphylaxis  . Sulfa Antibiotics Anaphylaxis  . Sulfonamide Derivatives Swelling    Throat swelling  . Montelukast Sodium Other (See Comments)    REACTION: hallucination  . Bee Venom Other (See Comments)    Unknown  . Latex Itching and Swelling  . Azithromycin Other (See Comments)    "makes me gag"  . Fluticasone-Salmeterol  Other (See Comments)    REACTION: hoarseness  . Ventolin [Albuterol] Other (See Comments)    cough    Antimicrobials this admission: 10/29 levaquin x 1 dose 10/30 ceftazidime Dose adjustments this admission:  Microbiology results: 10/29 sputum ordered, not yet collected 10/30 UA rare bacteria, nitrate neg  Thank you for allowing pharmacy to be a part of this patient's care.  Eudelia Bunch, Pharm.D. [phone] 02/26/2017 1:13 PM

## 2017-02-26 NOTE — Care Management Note (Signed)
Case Management Note  Patient Details  Name: EVALINE WALTMAN MRN: [id number] Date of Birth: Jun 23, 1940  Subjective/Objective:                  asthma  Action/Plan: Date:  February 26, 2017 Chart reviewed for concurrent status and case management needs.  Will continue to follow patient progress.  Discharge Planning: following for needs  Expected discharge date: [id number]  Velva Harman, BSN, [place], [place]   Expected Discharge Date:                  Expected Discharge Plan:  Home/Self Care  In-House Referral:     Discharge planning Services  CM Consult  Post Acute Care Choice:    Choice offered to:     DME Arranged:    DME Agency:     HH Arranged:    HH Agency:     Status of Service:  In process, will continue to follow  If discussed at Long Length of Stay Meetings, dates discussed:    Additional Comments:  Leeroy Cha, RN 02/26/2017, 8:42 AM

## 2017-02-26 NOTE — Progress Notes (Signed)
Pt c/o having a hard time catching her breath after administering first dose of levaquin.  Stated she's allergic to many other antibiotics and requested a breathing treatment.   Immediately stopped levaquin, gave breathing tx, steroids, and increased O2 to 3L.  Md on call paged at number in notes. Pt felt better after stopping antibiotics, also added med to allergy list., Will continue to monitor.

## 2017-02-26 NOTE — Progress Notes (Addendum)
Name: [name] MRN: [id number] DOB: [birth year]    ADMISSION DATE:  02/25/17   CHIEF COMPLAINT:  Cough and wheezing   BRIEF PATIENT DESCRIPTION:  76 year old female never smoker followed for asthma and bronchiectasis admitted from the office with a bronchiectatic exacerbation and possible pneumonia with  right middle lobe collapse on chest x-ray.  SIGNIFICANT EVENTS   STUDIES:  CT chest 10/29> Near complete left lower lobe consolidation and significant left lower lobe volume loss with patchy right lower lobe groundglass opacities stable chronic complete right middle lobe atelectasis diffuse bronchiectasis left main 1 vessel coronary arthrosclerosis   SUBJECTIVE:  Feels a little better   VITAL SIGNS: Temp:  [97.7 F (36.5 C)-98.1 F (36.7 C)] 97.7 F (36.5 C) (10/30 0415) Pulse Rate:  [73-95] 73 (10/30 0415) Resp:  [20] 20 (10/30 0415) BP: (118-150)/(64-81) 118/71 (10/30 0415) SpO2:  [92 %-98 %] 98 % (10/30 0847) Weight:  [125 lb 3.2 oz (56.8 kg)] 125 lb 3.2 oz (56.8 kg) (10/29 1802)  PHYSICAL EXAMINATION:  General: 76 year old female patient sitting up in the chair in no acute distress HENT: Normocephalic atraumatic no JVD Pulm: No accessory muscle use crackles right base Card: Regular rate and rhythm Ext: No edema brisk cap refill Abd: Soft nontender no organomegaly Neuro: Awake oriented no focal deficits    Recent Labs Lab 02/25/17 1806 02/26/17 0553  NA 139 138  K 3.5 3.9  CL 97* 102  CO2 30 25  BUN 11 14  CREATININE 0.82 0.83  GLUCOSE 99 150*    Recent Labs Lab 02/25/17 1806 02/26/17 0553  HGB 15.2* 14.2  HCT 45.3 43.5  WBC 8.6 7.1  PLT 330 302   Dg Chest 2 View  Result Date: 02/25/2017 CLINICAL DATA:  Right posterior back pain. History of hypertension and asthma. EXAM: CHEST  2 VIEW COMPARISON:  Chest x-ray of February 11, 2017 and May 12, 2016. FINDINGS: Further increased density is developed in the right infrahilar region which  corresponds to right middle lobe collapse today. The right upper lobe and lower lobe are clear. The left lung is clear. The heart and mediastinal structures are normal. There is calcification in the wall of the aortic arch. There is no pleural effusion or pneumothorax. The bony thorax exhibits no acute abnormality. IMPRESSION: Hyperinflation consistent with known reactive airway disease. Right middle lobe collapse likely secondary to a central obstructing lesion or less likely mucous plugging. Given the worsening chest x-ray appearance, chest CT scanning is recommended. Thoracic aortic atherosclerosis. Electronically Signed   By: David  Martinique M.D.   On: 02/25/2017 14:48    ASSESSMENT / PLAN: Acute Bronchiectatic Exacerbation with possible post obstruction PNA /RML collapse.  - Plan  Change abx to ceftaz she has tolerated cephalosporins in the past,(got acutely short of breath w/ Levaquin) Cont Albuterol Neb Cont solumedrol every 12 hrs Cont Flutter  F/u sputum    Chronic hypoxic Respiratory Failure  Plan Cont supplemental oxygen   Erick Colace ACNP-BC [place] Pager # [phone] OR # [phone] if no answer 02/26/2017, 11:27 AM    Attending Note:  I have examined patient, reviewed labs, studies and notes. I have discussed the case with Jerrye Bushy, and I agree with the data and plans as amended above.   76 year old woman with a history of bronchiectasis, chronic right middle lobe collapse, previously evaluated by bronchoscopy and PET scan.  She presents with increased cough and dyspnea, yellow brown mucus and back discomfort.  Admitted  for a flare of her bronchiectasis.  CT scan of the chest was reviewed by me from earlier today.  Shows cylindrical mucus impaction and bronchiectasis, right middle lobe collapse (chronic) and new left lower lobe consolidation.  She was admitted and treated with Levaquin.  She developed dyspnea after this medication was started last night  and so it was discontinued, considered to be a possible allergic reaction.  Vitals:   02/26/17 0415 02/26/17 0847 02/26/17 1344 02/26/17 1355  BP: 118/71  (!) 142/69   Pulse: 73  76   Resp: 20  16   Temp: 97.7 F (36.5 C)  97.6 F (36.4 C)   TempSrc: Oral  Oral   SpO2: 98% 98% 96% 92%  Weight:      Height:       On my evaluation she is a comfortable thin woman in no respiratory distress.  Her lungs are coarse bilaterally.  Her heart is regular without a murmur.  Abdomen is soft, nontender positive bowel sounds.  No notable edema.   We will change the Levaquin to ceftazidime to continue pseudomonal coverage.  Continue corticosteroids, albuterol and attempt to get a good sputum culture.  Continue flutter valve and guaifenesin.  She may need further pulmonary hygiene such as chest vest.  Will discuss with her if she is unable to start bringing up secretions   Baltazar Apo, MD, PhD 02/26/2017, 2:03 PM [facility] [phone] or if no answer [phone]

## 2017-02-27 LAB — EXPECTORATED SPUTUM ASSESSMENT W REFEX TO RESP CULTURE

## 2017-02-27 LAB — EXPECTORATED SPUTUM ASSESSMENT W GRAM STAIN, RFLX TO RESP C

## 2017-02-27 MED ORDER — PREDNISONE 20 MG PO TABS
40.0000 mg | ORAL_TABLET | Freq: Every day | ORAL | Status: DC
  Administered 2017-02-28: 40 mg via ORAL
  Filled 2017-02-27: qty 2

## 2017-02-27 MED ORDER — CEFIXIME 400 MG PO CAPS
400.0000 mg | ORAL_CAPSULE | Freq: Every day | ORAL | Status: DC
  Administered 2017-02-28: 400 mg via ORAL
  Filled 2017-02-27: qty 1

## 2017-02-27 MED ORDER — ALUM & MAG HYDROXIDE-SIMETH 200-200-20 MG/5ML PO SUSP
30.0000 mL | Freq: Four times a day (QID) | ORAL | Status: DC | PRN
  Administered 2017-02-27: 30 mL via ORAL
  Filled 2017-02-27 (×2): qty 30

## 2017-02-27 NOTE — Progress Notes (Signed)
Name: [name] MRN: [id number] DOB: Sep 25, 1940    ADMISSION DATE:  02/25/17   CHIEF COMPLAINT:  Cough and wheezing   BRIEF PATIENT DESCRIPTION:  76 year old female never smoker followed for asthma and bronchiectasis admitted from the office with a bronchiectatic exacerbation and possible pneumonia with  right middle lobe collapse on chest x-ray.  SIGNIFICANT EVENTS   STUDIES:  CT chest 10/29> Near complete left lower lobe consolidation and significant left lower lobe volume loss with patchy right lower lobe groundglass opacities stable chronic complete right middle lobe atelectasis diffuse bronchiectasis left main 1 vessel coronary arthrosclerosis   SUBJECTIVE:  Continues to feel better   VITAL SIGNS: Temp:  [97.6 F (36.4 C)-98.8 F (37.1 C)] 98.8 F (37.1 C) (10/30 2101) Pulse Rate:  [69-78] 78 (10/31 0347) Resp:  [15-16] 15 (10/30 2101) BP: (118-142)/(57-69) 123/57 (10/31 0347) SpO2:  [92 %-100 %] 100 % (10/30 2101) FiO2 (%):  [21 %] 21 % (10/30 1355)  PHYSICAL EXAMINATION:  General: Pleasant 76 year old female currently sitting up next to the bed she is not in any distress HEENT: Normocephalic atraumatic no jugular venous distention mucous membranes are moist Cardiac: Regular rate and rhythm without murmur rub or gallop Lungs: Scattered rhonchi, basilar rales, no accessory muscle use Abdomen: Soft nontender no organomegaly positive bowel sounds tolerating diet no pain Extremities/musculoskeletal: Equal strength and bulk warm and dry brisk cap refill no edema Neuro: Awake alert no focal deficits   Recent Labs Lab 02/25/17 1806 02/26/17 0553  NA 139 138  K 3.5 3.9  CL 97* 102  CO2 30 25  BUN 11 14  CREATININE 0.82 0.83  GLUCOSE 99 150*    Recent Labs Lab 02/25/17 1806 02/26/17 0553  HGB 15.2* 14.2  HCT 45.3 43.5  WBC 8.6 7.1  PLT 330 302   Dg Chest 2 View  Result Date: 02/25/2017 CLINICAL DATA:  Right posterior back pain. History of  hypertension and asthma. EXAM: CHEST  2 VIEW COMPARISON:  Chest x-ray of February 11, 2017 and May 12, 2016. FINDINGS: Further increased density is developed in the right infrahilar region which corresponds to right middle lobe collapse today. The right upper lobe and lower lobe are clear. The left lung is clear. The heart and mediastinal structures are normal. There is calcification in the wall of the aortic arch. There is no pleural effusion or pneumothorax. The bony thorax exhibits no acute abnormality. IMPRESSION: Hyperinflation consistent with known reactive airway disease. Right middle lobe collapse likely secondary to a central obstructing lesion or less likely mucous plugging. Given the worsening chest x-ray appearance, chest CT scanning is recommended. Thoracic aortic atherosclerosis. Electronically Signed   By: David  Martinique M.D.   On: 02/25/2017 14:48   Ct Chest W Contrast  Result Date: 02/26/2017 CLINICAL DATA:  Inpatient.  Pneumonia.  Abnormal chest radiograph. EXAM: CT CHEST WITH CONTRAST TECHNIQUE: Multidetector CT imaging of the chest was performed during intravenous contrast administration. CONTRAST:  35mL ISOVUE-300 IOPAMIDOL (ISOVUE-300) INJECTION 61% COMPARISON:  Chest radiograph from one day prior. 01/28/2015 chest CT. FINDINGS: Cardiovascular: Normal heart size. No significant pericardial fluid/thickening. Left main and left anterior descending coronary atherosclerosis. Atherosclerotic nonaneurysmal thoracic aorta. Normal caliber pulmonary arteries. No central pulmonary emboli. Mediastinum/Nodes: Subcentimeter hypodense left thyroid lobe nodules. Unremarkable esophagus. No pathologically enlarged axillary, mediastinal or hilar lymph nodes. Lungs/Pleura: No pneumothorax. No pleural effusion. There is stable chronic complete right middle lobe atelectasis. Patchy ground-glass opacities are noted throughout the right lower lobe. There is near  complete consolidation of the left lower lobe  with associated significant left lower lobe volume loss, new since 01/28/2015 chest CT. There is stable mild cylindrical and varicoid bronchiectasis scattered throughout both lungs with associated scattered foci of mucoid impaction throughout the dilated airways and in the central right middle lobe and left lower lobe airways. No discrete lung masses, central endobronchial lesions or significant pulmonary nodules in the aerated portions of the lungs. Upper abdomen: Small hiatal hernia. Musculoskeletal: No aggressive appearing focal osseous lesions. Moderate thoracic spondylosis. IMPRESSION: 1. Near complete left lower lobe consolidation with associated significant left lower lobe volume loss, new since 01/28/2015 chest CT. Patchy right lower lobe ground-glass opacities. Favor a multilobar pneumonia with near complete left lower lobe atelectasis. 2. Stable chronic complete right middle lobe atelectasis. 3. Stable mild cylindrical and varicoid bronchiectasis throughout both lungs with associated scattered mucoid impaction. Recurrent aspiration is a consideration. 4. Left main and 1 vessel coronary atherosclerosis. 5. Small hiatal hernia. Aortic Atherosclerosis (ICD10-I70.0). Electronically Signed   By: Ilona Sorrel M.D.   On: 02/26/2017 11:56    ASSESSMENT / PLAN: Acute Bronchiectatic Exacerbation with chronic RML collapse as well as new left lower lobe pneumonia  -Clinically improved, tolerating cephalosporin therapy without difficulty Plan  We will transition her to oral antibiotics today Discontinue Solu-Medrol start prednisone Continue bronchodilator Continue flutter valve, she has a chest PT vest at home Send sputum for evaluation  Chronic hypoxic Respiratory Failure  Plan Oxygen as needed   Erick Colace ACNP-BC [place] Pager # [phone] OR # [phone] if no answer 02/27/2017, 12:16 PM

## 2017-02-27 NOTE — Progress Notes (Signed)
PT demonstrated hands on understanding of Flutter device. 

## 2017-02-27 NOTE — Plan of Care (Signed)
Problem: Safety: Goal: Ability to remain free from injury will improve Outcome: Progressing Bed alarm; assist as needed

## 2017-02-28 DIAGNOSIS — J471 Bronchiectasis with (acute) exacerbation: Principal | ICD-10-CM

## 2017-02-28 MED ORDER — PREDNISONE 10 MG PO TABS: ORAL_TABLET | ORAL | 0 refills | Status: DC

## 2017-02-28 MED ORDER — CEFIXIME 400 MG PO CAPS: 400.0000 mg | ORAL_CAPSULE | Freq: Every day | ORAL | 0 refills | Status: DC

## 2017-02-28 NOTE — Progress Notes (Signed)
[facility] P[facility]  S: feels better Walked a few times yesterday, said she was able to walk the hallways without stopping Cough improved Still feels some dyspnea compared to baseline but definitely improved  Past Medical History:  Diagnosis Date  . Allergic rhinitis   . Anxiety   . Asthma   . HTN (hypertension)   . Pneumonia   . Shortness of breath dyspnea    ROS: Gen: Denies fever, chills, weight change, fatigue, night sweats CV: Denies chest pain, edema, orthopnea, paroxysmal nocturnal dyspnea, palpitations GI: Denies abdominal pain, nausea, vomiting, diarrhea, hematochezia, melena, constipation, change in bowel habits    O:  Vitals:   02/27/17 2039 02/27/17 2055 02/28/17 0313 02/28/17 0736  BP:  (!) 131/58 (!) 123/51   Pulse:  70 62 75  Resp:  18 18 18   Temp:  (!) 97.4 F (36.3 C) 98 F (36.7 C)   TempSrc:  Axillary Oral   SpO2: 97% 96% 94% (!) 88%  Weight:      Height:       RA  General:  Resting comfortably in bed HENT: NCAT OP clear PULM: Crackles, diminised L base B, normal effort CV: RRR, no mgr GI: BS+, soft, nontender MSK: normal bulk and tone Neuro: awake, alert, no distress, MAEW  CBC    Component Value Date/Time   WBC 7.1 02/26/2017 0553   RBC 4.80 02/26/2017 0553   HGB 14.2 02/26/2017 0553   HCT 43.5 02/26/2017 0553   PLT 302 02/26/2017 0553   MCV 90.6 02/26/2017 0553   MCH 29.6 02/26/2017 0553   MCHC 32.6 02/26/2017 0553   RDW 13.4 02/26/2017 0553   LYMPHSABS 2.6 02/25/2017 1806   MONOABS 1.0 02/25/2017 1806   EOSABS 0.4 02/25/2017 1806   BASOSABS 0.1 02/25/2017 1806   BMET    Component Value Date/Time   NA 138 02/26/2017 0553   K 3.9 02/26/2017 0553   CL 102 02/26/2017 0553   CO2 25 02/26/2017 0553   GLUCOSE 150 (H) 02/26/2017 0553   BUN 14 02/26/2017 0553   CREATININE 0.83 02/26/2017 0553   CALCIUM 8.9 02/26/2017 0553   GFRNONAA >60 02/26/2017 0553   GFRAA >60 02/26/2017 0553   10/30 CT chest images reviewed:   10/31 Resp culture>  few GNR, moderate GPC  Impression/Plan:  Bronchiectasis exacerbation: improving, counseled today on the importance of adherence to her mucociliary clearance regimen at home (vest) twice a day for the next several weeks, in 3-4 weeks she could go back to using it once daily as long as she exercises additionally as it is important to keep 2 sessions of mucociliary clearance per day.   Will follow up sputum cultures but given response to cefixime can continue this as an outpatient to complete a 10 day course Wean off prednisone Continue flutter valve as ordered She is requesting assistance at home, I explained we could ask for a PT and home health eval but that if she needs help with cooking meals she may need to go to rehab first.  Roselie Awkward, MD [place] [facility] Pager: [phone] Cell: [phone] After 3pm or if no response, call [phone]

## 2017-02-28 NOTE — Discharge Summary (Signed)
Physician Discharge Summary       Patient ID: [name] MRN: [id number] DOB/AGE: [birth year] 76 y.o.  Admit date: 02/25/2017 Discharge date: 02/28/2017  Discharge Diagnoses:   Acute bronchiectasis exacerbation w/ chronic RML collapse  New Left Lower Lobe Pneumonia  Chronic Respiratory Failure   Detailed Hospital Course:  76 year old female never smoker followed for asthma and bronchiectasis Abnormal CT chest, with retained secretion with cystic retention, mass effect, recurrent atelectasis negative workup with bronchoscopy and PET scan in 2012 She is on oxygen 2l/m . Pt complains over the last week that she has had increased cough congestion chest tightness shortness of breath and wheezing.  She has been coughing up thick yellow brown mucus.  She is very weak and is having pain into her back.  Patient says she was too weak to walk into the office today and had to have a wheelchair.  She says her appetite is down.  She has significant nausea.  No vomiting or diarrhea.  Chest x-ray shows worsening right-sided density with a right middle lobe collapse. She was admitted for IV antibiotics and supportive care. Was initially placed on levaquin but got short of breath w/ this so she was changed to [facility] which she tolerated well. Sputum culture was sent. Prelim data at time of dc was Mod GPC and few GNR. She made gradual clinical improvement. On 10/31 we transitioned her to oral antibiotics and encouraged her to increase her activity.    Discharge Plan by active problems  CAP (NOS) w/ acute bronchiectasis flare & mucous plugging -LLL consolidation is new. Also concerning for possible cavitation  Plan Discharge to home 10d rx w/  suprax F/u sputum culture (pending) Taper pred to off Resume symbicort  Encouraged chest vest at home Will get her in for f/u next week w/ NP Will need f/u CT scan after completing antibiotics.      Significant Hospital tests/ studies   CT chest 10/29> Near  complete left lower lobe consolidation and significant left lower lobe volume loss with patchy right lower lobe groundglass opacities stable chronic complete right middle lobe atelectasis diffuse bronchiectasis left main 1 vessel coronary arthrosclerosis    Discharge Exam: BP (!) 123/51 (BP Location: Right Arm)   Pulse 75   Temp 98 F (36.7 C) (Oral)   Resp 18   Ht 5\' 2"  (1.575 m)   Wt 125 lb 3.2 oz (56.8 kg)   SpO2 (!) 88%   BMI 22.90 kg/m  RA  General:  Resting comfortably in bed HENT: NCAT OP clear PULM: Crackles, diminised L base B, normal effort CV: RRR, no mgr GI: BS+, soft, nontender MSK: normal bulk and tone Neuro: awake, alert, no distress, MAEW  Labs at discharge Lab Results  Component Value Date   CREATININE 0.83 02/26/2017   BUN 14 02/26/2017   NA 138 02/26/2017   K 3.9 02/26/2017   CL 102 02/26/2017   CO2 25 02/26/2017   Lab Results  Component Value Date   WBC 7.1 02/26/2017   HGB 14.2 02/26/2017   HCT 43.5 02/26/2017   MCV 90.6 02/26/2017   PLT 302 02/26/2017   Lab Results  Component Value Date   ALT 14 02/25/2017   AST 21 02/25/2017   ALKPHOS 76 02/25/2017   BILITOT 0.8 02/25/2017   Lab Results  Component Value Date   INR 1.02 01/17/2011   INR 1.2 (H) 11/10/2010   INR 1.0 10/29/2008    Current radiology studies Ct Chest W Contrast  Result Date: 02/26/2017 CLINICAL DATA:  Inpatient.  Pneumonia.  Abnormal chest radiograph. EXAM: CT CHEST WITH CONTRAST TECHNIQUE: Multidetector CT imaging of the chest was performed during intravenous contrast administration. CONTRAST:  9mL ISOVUE-300 IOPAMIDOL (ISOVUE-300) INJECTION 61% COMPARISON:  Chest radiograph from one day prior. 01/28/2015 chest CT. FINDINGS: Cardiovascular: Normal heart size. No significant pericardial fluid/thickening. Left main and left anterior descending coronary atherosclerosis. Atherosclerotic nonaneurysmal thoracic aorta. Normal caliber pulmonary arteries. No central pulmonary  emboli. Mediastinum/Nodes: Subcentimeter hypodense left thyroid lobe nodules. Unremarkable esophagus. No pathologically enlarged axillary, mediastinal or hilar lymph nodes. Lungs/Pleura: No pneumothorax. No pleural effusion. There is stable chronic complete right middle lobe atelectasis. Patchy ground-glass opacities are noted throughout the right lower lobe. There is near complete consolidation of the left lower lobe with associated significant left lower lobe volume loss, new since 01/28/2015 chest CT. There is stable mild cylindrical and varicoid bronchiectasis scattered throughout both lungs with associated scattered foci of mucoid impaction throughout the dilated airways and in the central right middle lobe and left lower lobe airways. No discrete lung masses, central endobronchial lesions or significant pulmonary nodules in the aerated portions of the lungs. Upper abdomen: Small hiatal hernia. Musculoskeletal: No aggressive appearing focal osseous lesions. Moderate thoracic spondylosis. IMPRESSION: 1. Near complete left lower lobe consolidation with associated significant left lower lobe volume loss, new since 01/28/2015 chest CT. Patchy right lower lobe ground-glass opacities. Favor a multilobar pneumonia with near complete left lower lobe atelectasis. 2. Stable chronic complete right middle lobe atelectasis. 3. Stable mild cylindrical and varicoid bronchiectasis throughout both lungs with associated scattered mucoid impaction. Recurrent aspiration is a consideration. 4. Left main and 1 vessel coronary atherosclerosis. 5. Small hiatal hernia. Aortic Atherosclerosis (ICD10-I70.0). Electronically Signed   By: Ilona Sorrel M.D.   On: 02/26/2017 11:56    Disposition:  01-Home or Self Care   Allergies as of 02/28/2017      Reactions   Aspirin Other (See Comments)   Other reaction(s): Other (See Comments) STOMACH ULCERS. States stomach bubbles, becomes gaseous and irritated   Clindamycin Other (See  Comments)   REACTION: Neck, tongue swelling, SOB \\T \ rash   Fruit & Vegetable Daily [nutritional Supplements] Other (See Comments)   Tongue swelling, vomiting   Levofloxacin Shortness Of Breath   Penicillin G Anaphylaxis   Penicillins Swelling   Shellfish Allergy Anaphylaxis   Sulfa Antibiotics Anaphylaxis   Sulfonamide Derivatives Swelling   Throat swelling   Montelukast Sodium Other (See Comments)   REACTION: hallucination   Bee Venom Other (See Comments)   Unknown   Latex Itching, Swelling   Azithromycin Other (See Comments)   "makes me gag"   Fluticasone-salmeterol Other (See Comments)   REACTION: hoarseness   Ventolin [albuterol] Other (See Comments)   cough      Medication List    STOP taking these medications   chlorpheniramine-HYDROcodone 10-8 MG/5ML Suer Commonly known as:  TUSSIONEX PENNKINETIC ER   doxycycline 100 MG tablet Commonly known as:  VIBRA-TABS     TAKE these medications   acetaminophen 325 MG tablet Commonly known as:  TYLENOL Take 325 mg by mouth every 6 (six) hours as needed for moderate pain or headache.   albuterol 108 (90 Base) MCG/ACT inhaler Commonly known as:  PROAIR HFA Inhale 2 puffs into the lungs every 4 (four) hours as needed for wheezing or shortness of breath. For shortness of breath   albuterol (2.5 MG/3ML) 0.083% nebulizer solution Commonly known as:  PROVENTIL Take 3 mLs (2.5  mg total) by nebulization every 4 (four) hours as needed for wheezing or shortness of breath.   Cefixime 400 MG Caps capsule Commonly known as:  SUPRAX Take 1 capsule (400 mg total) by mouth daily.   diltiazem 120 MG tablet Commonly known as:  CARDIZEM Take 1 tablet (120 mg total) by mouth 2 (two) times daily.   FLUTTER Devi Blow through 4 times per set and repeat 3 sets per day, to loosen lung secretions.   Guaifenesin 1200 MG Tb12 Take 1 tablet (1,200 mg total) by mouth 2 (two) times daily.   loratadine 10 MG tablet Commonly known as:   CLARITIN Take 10 mg by mouth daily as needed for allergies.   multivitamin tablet Take 1 tablet by mouth daily.   predniSONE 10 MG tablet Commonly known as:  DELTASONE 4 tabs qd w/ food x 4 days, then 3 tabs qd x 4 days, then 2 tabs qd x 4 days, then 1 tab qd x4 days then stop.   SYMBICORT 160-4.5 MCG/ACT inhaler Generic drug:  budesonide-formoterol inhale 2 puffs by mouth twice a day   VITAMIN B COMPLEX PO Take 1 tablet by mouth daily as needed (takes when she remembers to take it).   VITAMIN C PO Take 1 tablet by mouth daily.      Follow-up Information    Parrett, Fonnie Mu, NP Follow up on 03/07/2017.   Specialty:  Pulmonary Disease Why:  at 1130am  Contact information: [address] [place] [address] [phone]           Discharged Condition: good  Physician Statement:   The Patient was personally examined, the discharge assessment and plan has been personally reviewed and I agree with ACNP Sakeenah Valcarcel's assessment and plan. 32 minutes of time have been dedicated to discharge assessment, planning and discharge instructions.   Signed: Clementeen Graham 02/28/2017, 10:50 AM

## 2017-02-28 NOTE — Progress Notes (Signed)
Discharge instructions including medication discussed to patient and a copy was provided to patient by RN; patient has no question; family member is at bedside and takes patient home.

## 2017-03-01 ENCOUNTER — Telehealth: Payer: Self-pay | Admitting: *Deleted

## 2017-03-01 LAB — CULTURE, RESPIRATORY: CULTURE: NORMAL

## 2017-03-01 LAB — CULTURE, RESPIRATORY W GRAM STAIN

## 2017-03-01 NOTE — Telephone Encounter (Signed)
Pt was on TCM list admitted 02/25/17 for Acute bronchiectasis exacerbation w/ chronic RML collapse, New Left Lower Lobe Pneumonia, and Chronic Respiratory Failure. Pt was D/C 02/28/17, and will f/u w/specialist Pulmonologist Parrett, Fonnie Mu, NP Follow up on 03/07/2017...[name]

## 2017-03-07 ENCOUNTER — Ambulatory Visit (INDEPENDENT_AMBULATORY_CARE_PROVIDER_SITE_OTHER): Payer: Medicare Other | Admitting: Adult Health

## 2017-03-07 ENCOUNTER — Encounter: Payer: Self-pay | Admitting: Adult Health

## 2017-03-07 DIAGNOSIS — J479 Bronchiectasis, uncomplicated: Secondary | ICD-10-CM

## 2017-03-07 MED ORDER — BUDESONIDE-FORMOTEROL FUMARATE 160-4.5 MCG/ACT IN AERO: 2.0000 | INHALATION_SPRAY | Freq: Two times a day (BID) | RESPIRATORY_TRACT | 0 refills | Status: DC

## 2017-03-07 NOTE — Patient Instructions (Addendum)
Finish Suprax as directed.  Use VEST several times a day .  Use flutter valve several times a day .  Finish prednisone taper.  Restart Symbicort 2 puffs Twice daily  , rinse after use.  Follow up in 2 weeks with chest xray Dr. Annamaria Boots  Or Maxden Naji NP  Please contact office for sooner follow up if symptoms do not improve or worsen or seek emergency care

## 2017-03-07 NOTE — Assessment & Plan Note (Signed)
Clinically improving  Finish abx  Check cxr on return  Will need CT chest in 6 weeks   Plan  Patient Instructions  Finish Suprax as directed.  Use VEST several times a day .  Use flutter valve several times a day .  Finish prednisone taper.  Restart Symbicort 2 puffs Twice daily  , rinse after use.  Follow up in 2 weeks with chest xray Dr. Annamaria Boots  Or Parrett NP  Please contact office for sooner follow up if symptoms do not improve or worsen or seek emergency care

## 2017-03-07 NOTE — Progress Notes (Signed)
@Patient  ID: [name], female    DOB: [birth year], 76 y.o.   MRN: [id number]  Chief Complaint  Patient presents with  . Follow-up    Bronchiectasis     Referring provider: Cassandria Anger, MD  HPI: 76 year old female never smoker followed for asthma and bronchiectasis Abnormal CT chest, with retained secretion with cystic retention, mass effect, recurrent atelectasis negative workup with bronchoscopy and PET scan in 2012 On oxygen 2l/m  As needed  And At bedtime    03/07/2017 Follow up : Bronchiectasis and PNA  Pt returns or a post hospital follow up . She was admitted 02/25/17 for Bronchectasis exacerbation and PNA . CT chest showed LLL consolidation with volume loss and RLL GGO . Chronic RML atx.  She was started on IV abx and steroids . Pulmonary hygiene with flutter and vest were emphasized. She was discharged on Suprax and Prednisone taper . She returns today feeling much better.  Cough and congestion are less. She is actually able to cough up some mucus .  We discussed the importance of using her flutter and vest on regular basis .  Appetite is okay , no fever, chest pain . Orthopnea, or edema.  Suppose to be taking symbicort . Encouraged to restart      Allergies  Allergen Reactions  . Aspirin Other (See Comments)    Other reaction(s): Other (See Comments) STOMACH ULCERS. States stomach bubbles, becomes gaseous and irritated  . Clindamycin Other (See Comments)    REACTION: Neck, tongue swelling, SOB \\T \ rash  . Fruit & Vegetable Daily [Nutritional Supplements] Other (See Comments)    Tongue swelling, vomiting  . Levofloxacin Shortness Of Breath  . Penicillin G Anaphylaxis  . Penicillins Swelling  . Shellfish Allergy Anaphylaxis  . Sulfa Antibiotics Anaphylaxis  . Sulfonamide Derivatives Swelling    Throat swelling  . Montelukast Sodium Other (See Comments)    REACTION: hallucination  . Bee Venom Other (See Comments)    Unknown  . Latex Itching and  Swelling  . Azithromycin Other (See Comments)    "makes me gag"  . Fluticasone-Salmeterol Other (See Comments)    REACTION: hoarseness  . Ventolin [Albuterol] Other (See Comments)    cough    Immunization History  Administered Date(s) Administered  . Influenza Split 04/30/2010  . Influenza Whole 01/19/2009  . Pneumococcal Conjugate-13 04/10/2013  . Pneumococcal Polysaccharide-23 04/30/2006    Past Medical History:  Diagnosis Date  . Allergic rhinitis   . Anxiety   . Asthma   . HTN (hypertension)   . Pneumonia   . Shortness of breath dyspnea     Tobacco History: Social History   Tobacco Use  Smoking Status Never Smoker  Smokeless Tobacco Never Used  Tobacco Comment   father smoked, worked w/smokers   Counseling given: Not Answered Comment: father smoked, worked w/smokers   Outpatient Encounter Medications as of 03/07/2017  Medication Sig  . acetaminophen (TYLENOL) 325 MG tablet Take 325 mg by mouth every 6 (six) hours as needed for moderate pain or headache.   . albuterol (PROAIR HFA) 108 (90 Base) MCG/ACT inhaler Inhale 2 puffs into the lungs every 4 (four) hours as needed for wheezing or shortness of breath. For shortness of breath  . albuterol (PROVENTIL) (2.5 MG/3ML) 0.083% nebulizer solution Take 3 mLs (2.5 mg total) by nebulization every 4 (four) hours as needed for wheezing or shortness of breath.  . Ascorbic Acid (VITAMIN C PO) Take 1 tablet by mouth daily.   Marland Kitchen  B Complex Vitamins (VITAMIN B COMPLEX PO) Take 1 tablet by mouth daily as needed (takes when she remembers to take it).   . Cefixime (SUPRAX) 400 MG CAPS capsule Take 1 capsule (400 mg total) by mouth daily.  Marland Kitchen diltiazem (CARDIZEM) 120 MG tablet Take 1 tablet (120 mg total) by mouth 2 (two) times daily.  . Guaifenesin 1200 MG TB12 Take 1 tablet (1,200 mg total) by mouth 2 (two) times daily.  Marland Kitchen loratadine (CLARITIN) 10 MG tablet Take 10 mg by mouth daily as needed for allergies.   . Multiple Vitamin  (MULTIVITAMIN) tablet Take 1 tablet by mouth daily.    . predniSONE (DELTASONE) 10 MG tablet 4 tabs qd w/ food x 4 days, then 3 tabs qd x 4 days, then 2 tabs qd x 4 days, then 1 tab qd x4 days then stop.  Marland Kitchen Respiratory Therapy Supplies (FLUTTER) DEVI Blow through 4 times per set and repeat 3 sets per day, to loosen lung secretions.  . SYMBICORT 160-4.5 MCG/ACT inhaler inhale 2 puffs by mouth twice a day  . budesonide-formoterol (SYMBICORT) 160-4.5 MCG/ACT inhaler Inhale 2 puffs 2 (two) times daily into the lungs.   No facility-administered encounter medications on file as of 03/07/2017.      Review of Systems  Constitutional:   No  weight loss, night sweats,  Fevers, chills, fatigue, or  lassitude.  HEENT:   No headaches,  Difficulty swallowing,  Tooth/dental problems, or  Sore throat,                No sneezing, itching, ear ache, nasal congestion, post nasal drip,   CV:  No chest pain,  Orthopnea, PND, swelling in lower extremities, anasarca, dizziness, palpitations, syncope.   GI  No heartburn, indigestion, abdominal pain, nausea, vomiting, diarrhea, change in bowel habits, loss of appetite, bloody stools.   Resp: No shortness of breath with exertion or at rest.  No excess mucus, no productive cough,  No non-productive cough,  No coughing up of blood.  No change in color of mucus.  No wheezing.  No chest wall deformity  Skin: no rash or lesions.  GU: no dysuria, change in color of urine, no urgency or frequency.  No flank pain, no hematuria   MS:  No joint pain or swelling.  No decreased range of motion.  No back pain.    Physical Exam  BP 114/64 (BP Location: Left Arm, Cuff Size: Normal)   Pulse 65   Wt 129 lb 6.4 oz (58.7 kg)   SpO2 96%   BMI 23.67 kg/m   GEN: A/Ox3; pleasant , NAD , elderly    HEENT:  [place]/AT,  EACs-clear, TMs-wnl, NOSE-clear, THROAT-clear, no lesions, no postnasal drip or exudate noted.   NECK:  Supple w/ fair ROM; no JVD; normal carotid impulses w/o  bruits; no thyromegaly or nodules palpated; no lymphadenopathy.    RESP  diminshed BS in bases . no accessory muscle use, no dullness to percussion  CARD:  RRR, no m/r/g, no peripheral edema, pulses intact, no cyanosis or clubbing.  GI:   Soft & nt; nml bowel sounds; no organomegaly or masses detected.   Musco: Warm bil, no deformities or joint swelling noted.   Neuro: alert, no focal deficits noted.    Skin: Warm, no lesions or rashes    Lab Results:   BMET  Dg Chest 2 View  Result Date: 02/25/2017 CLINICAL DATA:  Right posterior back pain. History of hypertension and asthma. EXAM: CHEST  2 VIEW COMPARISON:  Chest x-ray of February 11, 2017 and May 12, 2016. FINDINGS: Further increased density is developed in the right infrahilar region which corresponds to right middle lobe collapse today. The right upper lobe and lower lobe are clear. The left lung is clear. The heart and mediastinal structures are normal. There is calcification in the wall of the aortic arch. There is no pleural effusion or pneumothorax. The bony thorax exhibits no acute abnormality. IMPRESSION: Hyperinflation consistent with known reactive airway disease. Right middle lobe collapse likely secondary to a central obstructing lesion or less likely mucous plugging. Given the worsening chest x-ray appearance, chest CT scanning is recommended. Thoracic aortic atherosclerosis. Electronically Signed   By: David  Martinique M.D.   On: 02/25/2017 14:48   Dg Chest 2 View  Result Date: 02/11/2017 CLINICAL DATA:  Wheezing. EXAM: CHEST  2 VIEW COMPARISON:  Chest x-ray dated December 27, 2016. FINDINGS: The cardiomediastinal silhouette is normal in size. Normal pulmonary vascularity. Atherosclerotic calcification of the aortic arch. The lungs remain mildly hyperinflated. Chronic bronchitic changes. Unchanged right infrahilar hazy density. No pleural effusion or pneumothorax. No acute osseous abnormality. IMPRESSION: 1. Unchanged  right infrahilar hazy opacity. This is been relatively stable since August 2017. Consider chest CT for further evaluation. 2. Chronic bronchitic changes and hyperinflation. No new acute cardiopulmonary disease. Electronically Signed   By: Titus Dubin M.D.   On: 02/11/2017 16:38   Ct Chest W Contrast  Result Date: 02/26/2017 CLINICAL DATA:  Inpatient.  Pneumonia.  Abnormal chest radiograph. EXAM: CT CHEST WITH CONTRAST TECHNIQUE: Multidetector CT imaging of the chest was performed during intravenous contrast administration. CONTRAST:  51mL ISOVUE-300 IOPAMIDOL (ISOVUE-300) INJECTION 61% COMPARISON:  Chest radiograph from one day prior. 01/28/2015 chest CT. FINDINGS: Cardiovascular: Normal heart size. No significant pericardial fluid/thickening. Left main and left anterior descending coronary atherosclerosis. Atherosclerotic nonaneurysmal thoracic aorta. Normal caliber pulmonary arteries. No central pulmonary emboli. Mediastinum/Nodes: Subcentimeter hypodense left thyroid lobe nodules. Unremarkable esophagus. No pathologically enlarged axillary, mediastinal or hilar lymph nodes. Lungs/Pleura: No pneumothorax. No pleural effusion. There is stable chronic complete right middle lobe atelectasis. Patchy ground-glass opacities are noted throughout the right lower lobe. There is near complete consolidation of the left lower lobe with associated significant left lower lobe volume loss, new since 01/28/2015 chest CT. There is stable mild cylindrical and varicoid bronchiectasis scattered throughout both lungs with associated scattered foci of mucoid impaction throughout the dilated airways and in the central right middle lobe and left lower lobe airways. No discrete lung masses, central endobronchial lesions or significant pulmonary nodules in the aerated portions of the lungs. Upper abdomen: Small hiatal hernia. Musculoskeletal: No aggressive appearing focal osseous lesions. Moderate thoracic spondylosis. IMPRESSION:  1. Near complete left lower lobe consolidation with associated significant left lower lobe volume loss, new since 01/28/2015 chest CT. Patchy right lower lobe ground-glass opacities. Favor a multilobar pneumonia with near complete left lower lobe atelectasis. 2. Stable chronic complete right middle lobe atelectasis. 3. Stable mild cylindrical and varicoid bronchiectasis throughout both lungs with associated scattered mucoid impaction. Recurrent aspiration is a consideration. 4. Left main and 1 vessel coronary atherosclerosis. 5. Small hiatal hernia. Aortic Atherosclerosis (ICD10-I70.0). Electronically Signed   By: Ilona Sorrel M.D.   On: 02/26/2017 11:56     Assessment & Plan:   Bronchiectasis with retained secretions Recent flare now improving  Will need follow up CT chest in 6 weeks Cont w/ aggressive pulmonary hygiene   Plan  Patient Instructions  Finish Suprax  as directed.  Use VEST several times a day .  Use flutter valve several times a day .  Finish prednisone taper.  Restart Symbicort 2 puffs Twice daily  , rinse after use.  Follow up in 2 weeks with chest xray Dr. Annamaria Boots  Or Scotty Weigelt NP  Please contact office for sooner follow up if symptoms do not improve or worsen or seek emergency care      PNA (pneumonia) Clinically improving  Finish abx  Check cxr on return  Will need CT chest in 6 weeks   Plan  Patient Instructions  Finish Suprax as directed.  Use VEST several times a day .  Use flutter valve several times a day .  Finish prednisone taper.  Restart Symbicort 2 puffs Twice daily  , rinse after use.  Follow up in 2 weeks with chest xray Dr. Annamaria Boots  Or Dessie Tatem NP  Please contact office for sooner follow up if symptoms do not improve or worsen or seek emergency care         Rexene Edison, NP 03/07/2017

## 2017-03-07 NOTE — Assessment & Plan Note (Signed)
Recent flare now improving  Will need follow up CT chest in 6 weeks Cont w/ aggressive pulmonary hygiene   Plan  Patient Instructions  Finish Suprax as directed.  Use VEST several times a day .  Use flutter valve several times a day .  Finish prednisone taper.  Restart Symbicort 2 puffs Twice daily  , rinse after use.  Follow up in 2 weeks with chest xray Dr. Annamaria Boots  Or Parrett NP  Please contact office for sooner follow up if symptoms do not improve or worsen or seek emergency care

## 2017-03-24 DIAGNOSIS — J410 Simple chronic bronchitis: Secondary | ICD-10-CM | POA: Diagnosis not present

## 2017-03-25 ENCOUNTER — Ambulatory Visit: Payer: Medicare Other | Admitting: Adult Health

## 2017-03-26 ENCOUNTER — Encounter: Payer: Self-pay | Admitting: Adult Health

## 2017-03-26 ENCOUNTER — Ambulatory Visit (INDEPENDENT_AMBULATORY_CARE_PROVIDER_SITE_OTHER): Payer: Medicare Other | Admitting: Adult Health

## 2017-03-26 ENCOUNTER — Ambulatory Visit (INDEPENDENT_AMBULATORY_CARE_PROVIDER_SITE_OTHER)
Admission: RE | Admit: 2017-03-26 | Discharge: 2017-03-26 | Disposition: A | Discharge status: Home / Self Care | Payer: Medicare Other | Source: Ambulatory Visit | Attending: Adult Health | Admitting: Adult Health

## 2017-03-26 VITALS — BP 110/58 | HR 91 | Ht 62.0 in | Wt 131.8 lb

## 2017-03-26 DIAGNOSIS — J4541 Moderate persistent asthma with (acute) exacerbation: Secondary | ICD-10-CM

## 2017-03-26 DIAGNOSIS — J181 Lobar pneumonia, unspecified organism: Secondary | ICD-10-CM | POA: Diagnosis not present

## 2017-03-26 DIAGNOSIS — J189 Pneumonia, unspecified organism: Secondary | ICD-10-CM

## 2017-03-26 DIAGNOSIS — J479 Bronchiectasis, uncomplicated: Secondary | ICD-10-CM | POA: Diagnosis not present

## 2017-03-26 NOTE — Assessment & Plan Note (Signed)
Recent flare now improving  Cont on Symbicort.

## 2017-03-26 NOTE — Progress Notes (Signed)
@Patient  ID: [name], female    DOB: Dec 16, 1940, 76 y.o.   MRN: [id number]  Chief Complaint  Patient presents with  . Follow-up    Bronchiectasis     Referring provider: Cassandria Anger, MD  HPI: 76 year old female never smoker followed for asthma and bronchiectasis Abnormal CT chest, with retained secretion with cystic retention, mass effect, recurrent atelectasis negative workup with bronchoscopy and PET scan in 2012 On oxygen 2l/m  As needed  And At bedtime     03/26/2017 Follow up : Bronchiectasis and PNA  Pt returns for 2 week follow up she was admitted end of Oct for Bronchiectais exacerbation and PNA . CT chest showed LLL consolidation with volum loss and RLL GGO . Chronic RML atx. She was tx w/ IV abx and steroids . Continue on VEST and flutter.  She is feeling better with decreased congestion . Breathing has improved. No fever, chest pain, orthopnea or edema. Appetite is improving .  CXR today shows gradual improvement bilateral consolidation . Last visit we restarted Symbicort twice daily.  Continue on vest and flutter valve.  Patient says her breathing has improved.  Declines flu shot today .      Allergies  Allergen Reactions  . Aspirin Other (See Comments)    Other reaction(s): Other (See Comments) STOMACH ULCERS. States stomach bubbles, becomes gaseous and irritated  . Clindamycin Other (See Comments)    REACTION: Neck, tongue swelling, SOB \\T \ rash  . Fruit & Vegetable Daily [Nutritional Supplements] Other (See Comments)    Tongue swelling, vomiting  . Levofloxacin Shortness Of Breath  . Penicillin G Anaphylaxis  . Penicillins Swelling  . Shellfish Allergy Anaphylaxis  . Sulfa Antibiotics Anaphylaxis  . Sulfonamide Derivatives Swelling    Throat swelling  . Montelukast Sodium Other (See Comments)    REACTION: hallucination  . Bee Venom Other (See Comments)    Unknown  . Latex Itching and Swelling  . Azithromycin Other (See Comments)   "makes me gag"  . Fluticasone-Salmeterol Other (See Comments)    REACTION: hoarseness  . Ventolin [Albuterol] Other (See Comments)    cough    Immunization History  Administered Date(s) Administered  . Influenza Split 04/30/2010  . Influenza Whole 01/19/2009  . Pneumococcal Conjugate-13 04/10/2013  . Pneumococcal Polysaccharide-23 04/30/2006    Past Medical History:  Diagnosis Date  . Allergic rhinitis   . Anxiety   . Asthma   . HTN (hypertension)   . Pneumonia   . Shortness of breath dyspnea     Tobacco History: Social History   Tobacco Use  Smoking Status Never Smoker  Smokeless Tobacco Never Used  Tobacco Comment   father smoked, worked w/smokers   Counseling given: Not Answered Comment: father smoked, worked w/smokers   Outpatient Encounter Medications as of 03/26/2017  Medication Sig  . acetaminophen (TYLENOL) 325 MG tablet Take 325 mg by mouth every 6 (six) hours as needed for moderate pain or headache.   . albuterol (PROAIR HFA) 108 (90 Base) MCG/ACT inhaler Inhale 2 puffs into the lungs every 4 (four) hours as needed for wheezing or shortness of breath. For shortness of breath  . albuterol (PROVENTIL) (2.5 MG/3ML) 0.083% nebulizer solution Take 3 mLs (2.5 mg total) by nebulization every 4 (four) hours as needed for wheezing or shortness of breath.  . Ascorbic Acid (VITAMIN C PO) Take 1 tablet by mouth daily.   . B Complex Vitamins (VITAMIN B COMPLEX PO) Take 1 tablet by mouth daily as  needed (takes when she remembers to take it).   Marland Kitchen diltiazem (CARDIZEM) 120 MG tablet Take 1 tablet (120 mg total) by mouth 2 (two) times daily.  . Guaifenesin 1200 MG TB12 Take 1 tablet (1,200 mg total) by mouth 2 (two) times daily.  Marland Kitchen loratadine (CLARITIN) 10 MG tablet Take 10 mg by mouth daily as needed for allergies.   . Multiple Vitamin (MULTIVITAMIN) tablet Take 1 tablet by mouth daily.    Marland Kitchen Respiratory Therapy Supplies (FLUTTER) DEVI Blow through 4 times per set and repeat  3 sets per day, to loosen lung secretions.  . SYMBICORT 160-4.5 MCG/ACT inhaler inhale 2 puffs by mouth twice a day  . [DISCONTINUED] budesonide-formoterol (SYMBICORT) 160-4.5 MCG/ACT inhaler Inhale 2 puffs 2 (two) times daily into the lungs. (Patient not taking: Reported on 03/26/2017)  . [DISCONTINUED] Cefixime (SUPRAX) 400 MG CAPS capsule Take 1 capsule (400 mg total) by mouth daily. (Patient not taking: Reported on 03/26/2017)  . [DISCONTINUED] predniSONE (DELTASONE) 10 MG tablet 4 tabs qd w/ food x 4 days, then 3 tabs qd x 4 days, then 2 tabs qd x 4 days, then 1 tab qd x4 days then stop. (Patient not taking: Reported on 03/26/2017)   No facility-administered encounter medications on file as of 03/26/2017.      Review of Systems  Constitutional:   No  weight loss, night sweats,  Fevers, chills,  +fatigue, or  lassitude.  HEENT:   No headaches,  Difficulty swallowing,  Tooth/dental problems, or  Sore throat,                No sneezing, itching, ear ache,  +nasal congestion, post nasal drip,   CV:  No chest pain,  Orthopnea, PND, swelling in lower extremities, anasarca, dizziness, palpitations, syncope.   GI  No heartburn, indigestion, abdominal pain, nausea, vomiting, diarrhea, change in bowel habits, loss of appetite, bloody stools.   Resp:    No chest wall deformity  Skin: no rash or lesions.  GU: no dysuria, change in color of urine, no urgency or frequency.  No flank pain, no hematuria   MS:  No joint pain or swelling.  No decreased range of motion.  No back pain.    Physical Exam  BP (!) 110/58 (BP Location: Left Arm, Cuff Size: Normal)   Pulse 91   Ht 5\' 2"  (1.575 m)   Wt 131 lb 12.8 oz (59.8 kg)   SpO2 94%   BMI 24.11 kg/m   GEN: A/Ox3; pleasant , NAD,elderly    HEENT:  [place]/AT,  EACs-clear, TMs-wnl, NOSE-clear, THROAT-clear, no lesions, no postnasal drip or exudate noted.   NECK:  Supple w/ fair ROM; no JVD; normal carotid impulses w/o bruits; no thyromegaly or  nodules palpated; no lymphadenopathy.    RESP  Few rhonchi , no accessory muscle use, no dullness to percussion  CARD:  RRR, no m/r/g, no peripheral edema, pulses intact, no cyanosis or clubbing.  GI:   Soft & nt; nml bowel sounds; no organomegaly or masses detected.   Musco: Warm bil, no deformities or joint swelling noted.   Neuro: alert, no focal deficits noted.    Skin: Warm, no lesions or rashes    Lab Results:   BNP    Component Value Date/Time   BNP 90.9 02/25/2017 1806    ProBNP  Imaging: Dg Chest 2 View  Result Date: 03/26/2017 CLINICAL DATA:  Follow-up pneumonia. EXAM: CHEST  2 VIEW COMPARISON:  Chest CT 02/26/2017 FINDINGS: Cardiomediastinal silhouette  is normal. Mediastinal contours appear intact. Mild peribronchial airspace consolidation in bilateral lower lobes and middle lobes. Osseous structures are without acute abnormality. Soft tissues are grossly normal. IMPRESSION: Mild residual peribronchial airspace consolidation in bilateral lower lobes and middle lobes. Electronically Signed   By: Fidela Salisbury M.D.   On: 03/26/2017 11:20   Dg Chest 2 View  Result Date: 02/25/2017 CLINICAL DATA:  Right posterior back pain. History of hypertension and asthma. EXAM: CHEST  2 VIEW COMPARISON:  Chest x-ray of February 11, 2017 and May 12, 2016. FINDINGS: Further increased density is developed in the right infrahilar region which corresponds to right middle lobe collapse today. The right upper lobe and lower lobe are clear. The left lung is clear. The heart and mediastinal structures are normal. There is calcification in the wall of the aortic arch. There is no pleural effusion or pneumothorax. The bony thorax exhibits no acute abnormality. IMPRESSION: Hyperinflation consistent with known reactive airway disease. Right middle lobe collapse likely secondary to a central obstructing lesion or less likely mucous plugging. Given the worsening chest x-ray appearance, chest  CT scanning is recommended. Thoracic aortic atherosclerosis. Electronically Signed   By: David  Martinique M.D.   On: 02/25/2017 14:48   Ct Chest W Contrast  Result Date: 02/26/2017 CLINICAL DATA:  Inpatient.  Pneumonia.  Abnormal chest radiograph. EXAM: CT CHEST WITH CONTRAST TECHNIQUE: Multidetector CT imaging of the chest was performed during intravenous contrast administration. CONTRAST:  19mL ISOVUE-300 IOPAMIDOL (ISOVUE-300) INJECTION 61% COMPARISON:  Chest radiograph from one day prior. 01/28/2015 chest CT. FINDINGS: Cardiovascular: Normal heart size. No significant pericardial fluid/thickening. Left main and left anterior descending coronary atherosclerosis. Atherosclerotic nonaneurysmal thoracic aorta. Normal caliber pulmonary arteries. No central pulmonary emboli. Mediastinum/Nodes: Subcentimeter hypodense left thyroid lobe nodules. Unremarkable esophagus. No pathologically enlarged axillary, mediastinal or hilar lymph nodes. Lungs/Pleura: No pneumothorax. No pleural effusion. There is stable chronic complete right middle lobe atelectasis. Patchy ground-glass opacities are noted throughout the right lower lobe. There is near complete consolidation of the left lower lobe with associated significant left lower lobe volume loss, new since 01/28/2015 chest CT. There is stable mild cylindrical and varicoid bronchiectasis scattered throughout both lungs with associated scattered foci of mucoid impaction throughout the dilated airways and in the central right middle lobe and left lower lobe airways. No discrete lung masses, central endobronchial lesions or significant pulmonary nodules in the aerated portions of the lungs. Upper abdomen: Small hiatal hernia. Musculoskeletal: No aggressive appearing focal osseous lesions. Moderate thoracic spondylosis. IMPRESSION: 1. Near complete left lower lobe consolidation with associated significant left lower lobe volume loss, new since 01/28/2015 chest CT. Patchy right  lower lobe ground-glass opacities. Favor a multilobar pneumonia with near complete left lower lobe atelectasis. 2. Stable chronic complete right middle lobe atelectasis. 3. Stable mild cylindrical and varicoid bronchiectasis throughout both lungs with associated scattered mucoid impaction. Recurrent aspiration is a consideration. 4. Left main and 1 vessel coronary atherosclerosis. 5. Small hiatal hernia. Aortic Atherosclerosis (ICD10-I70.0). Electronically Signed   By: Ilona Sorrel M.D.   On: 02/26/2017 11:56     Assessment & Plan:   Bronchiectasis with retained secretions Recent flare with PNA  Clinically improving CXR shows gradual improvmement   Plan  Patient Instructions  Use VEST several times a day .  Use flutter valve several times a day .  Mucinex DM Twice daily  As needed  Cough/congestion .  Continue Symbicort 2 puffs Twice daily  , rinse after use.  Follow up  in 6 weeks with chest xray Dr. Annamaria Boots and As needed  Please contact office for sooner follow up if symptoms do not improve or worsen or seek emergency care      PNA (pneumonia) Clinically improving  Check cxr in 6 weeks   Acute asthma exacerbation Recent flare now improving  Cont on Symbicort.      Rexene Edison, NP 03/26/2017

## 2017-03-26 NOTE — Assessment & Plan Note (Signed)
Clinically improving  Check cxr in 6 weeks

## 2017-03-26 NOTE — Assessment & Plan Note (Signed)
Recent flare with PNA  Clinically improving CXR shows gradual improvmement   Plan  Patient Instructions  Use VEST several times a day .  Use flutter valve several times a day .  Mucinex DM Twice daily  As needed  Cough/congestion .  Continue Symbicort 2 puffs Twice daily  , rinse after use.  Follow up in 6 weeks with chest xray Dr. Annamaria Boots and As needed  Please contact office for sooner follow up if symptoms do not improve or worsen or seek emergency care

## 2017-03-26 NOTE — Patient Instructions (Addendum)
Use VEST several times a day .  Use flutter valve several times a day .  Mucinex DM Twice daily  As needed  Cough/congestion .  Continue Symbicort 2 puffs Twice daily  , rinse after use.  Follow up in 6 weeks with chest xray Dr. Annamaria Boots and As needed  Please contact office for sooner follow up if symptoms do not improve or worsen or seek emergency care

## 2017-04-22 ENCOUNTER — Ambulatory Visit: Payer: Self-pay | Admitting: *Deleted

## 2017-04-22 NOTE — Telephone Encounter (Signed)
   Reason for Disposition . Wheezing can be heard across the room  Answer Assessment - Initial Assessment Questions Also have moderate abdominal pain in lower abdomen that moves from side to side, this has been going on at least a week. Taking mylanta with no relief.  1. RESPIRATORY STATUS: "Describe your breathing?" (e.g., wheezing, shortness of breath, unable to speak, severe coughing)      Shortness of breath longer than a week, some wheezing. Audible wheezing heard over phone call. 2. ONSET: "When did this breathing problem begin?"      About a week ago. 3. PATTERN "Does the difficult breathing come and go, or has it been constant since it started?"      Intermittent SOB and abdominal pain. 4. SEVERITY: "How bad is your breathing?" (e.g., mild, moderate, severe)    - MILD: No SOB at rest, mild SOB with walking, speaks normally in sentences, can lay down, no retractions, pulse < 100.    - MODERATE: SOB at rest, SOB with minimal exertion and prefers to sit, cannot lie down flat, speaks in phrases, mild retractions, audible wheezing, pulse 100-120.    - SEVERE: Very SOB at rest, speaks in single words, struggling to breathe, sitting hunched forward, retractions, pulse > 120     5 5. RECURRENT SYMPTOM: "Have you had difficulty breathing before?" If so, ask: "When was the last time?" and "What happened that time?"     Yes, chronic bronchitis she reports. 6. CARDIAC HISTORY: "Do you have any history of heart disease?" (e.g., heart attack, angina, bypass surgery, angioplasty)     no 7. LUNG HISTORY: "Do you have any history of lung disease?"  (e.g., pulmonary embolus, asthma, emphysema)   Asthma, hx of bronchitis often 8. CAUSE: "What do you think is causing the breathing problem?"    unsure 9. OTHER SYMPTOMS: "Do you have any other symptoms? (e.g., dizziness, runny nose, cough, chest pain, fever)     no 10. PREGNANCY: "Is there any chance you are pregnant?" "When was your last menstrual  period?"     no 11. TRAVEL: "Have you traveled out of the country in the last month?" (e.g., travel history, exposures)    no  Protocols used: BREATHING DIFFICULTY-A-AH

## 2017-04-22 NOTE — Telephone Encounter (Signed)
FYI

## 2017-04-23 DIAGNOSIS — J410 Simple chronic bronchitis: Secondary | ICD-10-CM | POA: Diagnosis not present

## 2017-04-24 ENCOUNTER — Telehealth: Payer: Self-pay | Admitting: Internal Medicine

## 2017-04-24 ENCOUNTER — Telehealth: Payer: Self-pay | Admitting: Pharmacy Technician

## 2017-04-24 NOTE — Telephone Encounter (Signed)
Noted  

## 2017-04-25 ENCOUNTER — Ambulatory Visit: Payer: Medicare Other | Admitting: Internal Medicine

## 2017-04-25 ENCOUNTER — Encounter: Payer: Self-pay | Admitting: Internal Medicine

## 2017-04-25 ENCOUNTER — Ambulatory Visit (INDEPENDENT_AMBULATORY_CARE_PROVIDER_SITE_OTHER)
Admission: RE | Admit: 2017-04-25 | Discharge: 2017-04-25 | Disposition: A | Discharge status: Home / Self Care | Payer: Medicare Other | Source: Ambulatory Visit | Attending: Internal Medicine | Admitting: Internal Medicine

## 2017-04-25 VITALS — BP 120/78 | HR 84 | Ht 63.0 in | Wt 124.2 lb

## 2017-04-25 DIAGNOSIS — J471 Bronchiectasis with (acute) exacerbation: Secondary | ICD-10-CM

## 2017-04-25 DIAGNOSIS — J9611 Chronic respiratory failure with hypoxia: Secondary | ICD-10-CM | POA: Diagnosis not present

## 2017-04-25 DIAGNOSIS — J479 Bronchiectasis, uncomplicated: Secondary | ICD-10-CM

## 2017-04-25 DIAGNOSIS — K219 Gastro-esophageal reflux disease without esophagitis: Secondary | ICD-10-CM | POA: Diagnosis not present

## 2017-04-25 DIAGNOSIS — R05 Cough: Secondary | ICD-10-CM | POA: Diagnosis not present

## 2017-04-25 MED ORDER — FAMOTIDINE 20 MG PO TABS: ORAL_TABLET | ORAL | 12 refills | Status: AC

## 2017-04-25 MED ORDER — ALBUTEROL SULFATE (2.5 MG/3ML) 0.083% IN NEBU
2.5000 mg | INHALATION_SOLUTION | RESPIRATORY_TRACT | 12 refills | Status: AC | PRN
Start: 2017-04-25 — End: ?

## 2017-04-25 MED ORDER — PREDNISONE 10 MG PO TABS: ORAL_TABLET | ORAL | 0 refills | Status: DC

## 2017-04-25 NOTE — Progress Notes (Signed)
Subjective:    Patient ID: [name], female    DOB: 29-Mar-1941, 76 y.o.   MRN: [id number]  HPI female never smoker followed for asthma/bronchiectasis, retained secretions with cystic retention/mass effect, recurrent atelectasis( Negative workup with bronchoscopies and PET scans 4098), complicated by GERD with suspected recurrent aspiration. PFT 09/06/10 Bronchoscopy 11/10/10 Allergy skin test 10/04/10 Office Spirometry 07/08/14  -----------------------------------------------------------------------------------  02/12/17-  76 year old female never smoker followed for asthma/bronchiectasis, retained secretions with cystic retention/mass effect, recurrent atelectasis. Negative workup with bronchoscopies and PET scans 1191, Complicated by GERD O2 2 L sleep and prn  Advanced (POC Inogen) SOB w/exertion, productive cough, coughing up yellow mucus.  Stable cough varies some with weather and from day to day. Bothersome sensation that she can't cough her self clear and usually not productive. Flutter device didn't work well. She has a pneumatic vest which she hasn't used in quite a while but is still paying for. She does recognize reflux from time to time now. We discussed reflux precautions. Did pulmonary rehabilitation. Declines flu vaccine "makes me sick". Up-to-date on pneumonia vaccine. CXR 02/11/17- IMPRESSION: 1. Unchanged right infrahilar hazy opacity. This is been relatively stable since August 2017. Consider chest CT for further evaluation. 2. Chronic bronchitic changes and hyperinflation. No new acute cardiopulmonary disease.  04/25/17- 76 year old female never smoker followed for asthma/bronchiectasis, retained secretions with cystic retention/mass effect, recurrent atelectasis. Negative workup with bronchoscopies and PET scans 4782, Complicated by GERD O2 2 L sleep and prn  Advanced (POC Inogen) -----increased acid reflux, increased shortness of breath (unable to walk 10  steps) Symbicort 160, flutter device, nebulizer albuterol, albuterol HFA, VEST and Flutter. More aware of heartburn, and has only been taking Mylanta.  We discussed role of reflux in causing her bronchiectasis/retained secretions. CXR 03/26/17 IMPRESSION: Mild residual peribronchial airspace consolidation in bilateral lower lobes and middle lobes.  ROS-see HPI  + = positive Constitutional:   No-   weight loss, night sweats, fevers,  chills, fatigue, lassitude. HEENT:   No-  headaches, difficulty swallowing, tooth/dental problems, sore throat,       No-  sneezing, itching, ear ache, nasal congestion, post nasal drip,  CV:  No-   chest pain, +orthopnea, no-PND, swelling in lower extremities, anasarca,  dizziness, palpitations Resp: +   shortness of breath with exertion or at rest.             + productive cough,  + non-productive cough,  No- coughing up of blood.              change in color of mucus. wheezing.   Skin: No-   rash or lesions. GI:  + heartburn, indigestion, abdominal pain, nausea, vomiting,  GU: . MS:  No-   joint pain or swelling.   Neuro-     nothing unusual Psych:  No- change in mood or affect. No depression or anxiety.  No memory loss.  OBJ- Physical Exam General- Alert, Oriented, Affect-appropriate, Distress- none acute. O2 2L Skin- rash-none, lesions- none, excoriation- none   Lymphadenopathy- none Head- atraumatic            Eyes- Gross vision intact, PERRLA, conjunctivae and secretions clear            Ears- +somewhat hard of hearing            Nose- Clear, no-Septal dev, mucus, polyps, erosion, perforation             Throat- Mallampati II , mucosa + slightly coated, drainage- none,  tonsils- atrophic,  Neck- flexible , trachea midline, no stridor , thyroid nl, carotid no bruit Chest - symmetrical excursion , unlabored           Heart/CV- RRR , no murmur , no gallop  , no rub, nl s1 s2                           - JVD- none , edema- none, stasis changes- none,  varices- none           Lung-  wheeze -none ,  cough + light, dullness-none, rub- none, breathing unlabored, + distant           Chest wall-  Abd- Br/ Gen/ Rectal- Not done, not indicated Extrem- cyanosis- none, clubbing, none, atrophy- none, strength- wheelchair  Neuro- grossly intact to observation

## 2017-04-25 NOTE — Patient Instructions (Signed)
Scripts sent to drug store  Order- CXR    Dx exacerbation bronchiectasis  Please call as needed

## 2017-04-26 ENCOUNTER — Telehealth: Payer: Self-pay | Admitting: Internal Medicine

## 2017-04-26 NOTE — Telephone Encounter (Signed)
Notes recorded by Deneise Lever, MD on 04/26/2017 at 11:05 AM EST CXR looks the same, still with retained secretions and probably some chronic scarring. Not worse than before and nothing new, so no clear reason for her to feel more short of breath   Called and spoke with pt and she is aware of results per CY. Nothing further is needed.

## 2017-04-26 NOTE — Telephone Encounter (Signed)
Duplicate msg.

## 2017-04-27 NOTE — Assessment & Plan Note (Signed)
I recommended she start Pepcid rather than depending on symptom control with Mylanta.

## 2017-04-27 NOTE — Assessment & Plan Note (Signed)
And used to need oxygen and she is encouraged to use it more during the daytime as needed.

## 2017-04-27 NOTE — Assessment & Plan Note (Signed)
Mild exacerbation.  No definite event and I suspect she has had some increase in retained secretions. Plan-CXR, prednisone taper, emphasized use of vest and flutter device.

## 2017-05-08 ENCOUNTER — Telehealth: Payer: Self-pay | Admitting: Internal Medicine

## 2017-05-08 MED ORDER — CLARITHROMYCIN 500 MG PO TABS: 500.0000 mg | ORAL_TABLET | Freq: Two times a day (BID) | ORAL | 0 refills | Status: DC

## 2017-05-08 NOTE — Telephone Encounter (Signed)
Spoke with pt. States that she is not feeling well. Reports increased chest tightness, coughing and wheezing. Cough is producing yellow mucus. Denies SOB, fever or body aches. Symptoms started a few weeks ago and have progressively gotten worse. She has been using her albuterol HFA and nebs and Symbicort as prescribed. Pt would like CY's recommendations.  CY - please advise. Thanks.  Allergies  Allergen Reactions  . Aspirin Other (See Comments)    Other reaction(s): Other (See Comments) STOMACH ULCERS. States stomach bubbles, becomes gaseous and irritated  . Clindamycin Other (See Comments)    REACTION: Neck, tongue swelling, SOB \\T \ rash  . Fruit & Vegetable Daily [Nutritional Supplements] Other (See Comments)    Tongue swelling, vomiting  . Levofloxacin Shortness Of Breath  . Penicillin G Anaphylaxis  . Penicillins Swelling  . Shellfish Allergy Anaphylaxis  . Sulfa Antibiotics Anaphylaxis  . Sulfonamide Derivatives Swelling    Throat swelling  . Montelukast Sodium Other (See Comments)    REACTION: hallucination  . Bee Venom Other (See Comments)    Unknown  . Latex Itching and Swelling  . Azithromycin Other (See Comments)    "makes me gag"  . Fluticasone-Salmeterol Other (See Comments)    REACTION: hoarseness  . Ventolin [Albuterol] Other (See Comments)    cough   Current Outpatient Medications on File Prior to Visit  Medication Sig Dispense Refill  . acetaminophen (TYLENOL) 325 MG tablet Take 325 mg by mouth every 6 (six) hours as needed for moderate pain or headache.     . albuterol (PROAIR HFA) 108 (90 Base) MCG/ACT inhaler Inhale 2 puffs into the lungs every 4 (four) hours as needed for wheezing or shortness of breath. For shortness of breath 1 Inhaler 11  . albuterol (PROVENTIL) (2.5 MG/3ML) 0.083% nebulizer solution Take 3 mLs (2.5 mg total) by nebulization every 4 (four) hours as needed for wheezing or shortness of breath. 75 mL 12  . alum & mag hydroxide-simeth  (MAALOX PLUS) 400-400-40 MG/5ML suspension Take 10 mLs by mouth every 6 (six) hours as needed for indigestion.    . Ascorbic Acid (VITAMIN C PO) Take 1 tablet by mouth daily.     . B Complex Vitamins (VITAMIN B COMPLEX PO) Take 1 tablet by mouth daily as needed (takes when she remembers to take it).     Marland Kitchen diltiazem (CARDIZEM) 120 MG tablet Take 1 tablet (120 mg total) by mouth 2 (two) times daily. 60 tablet 9  . famotidine (PEPCID) 20 MG tablet 1 tablet once daily before meal 30 tablet 12  . Guaifenesin 1200 MG TB12 Take 1 tablet (1,200 mg total) by mouth 2 (two) times daily. 20 each 0  . loratadine (CLARITIN) 10 MG tablet Take 10 mg by mouth daily as needed for allergies.     . Multiple Vitamin (MULTIVITAMIN) tablet Take 1 tablet by mouth daily.      . predniSONE (DELTASONE) 10 MG tablet 4 X 2 DAYS, 3 X 2 DAYS, 2 X 2 DAYS, 1 X 2 DAYS 20 tablet 0  . Respiratory Therapy Supplies (FLUTTER) DEVI Blow through 4 times per set and repeat 3 sets per day, to loosen lung secretions. 1 each 0  . SYMBICORT 160-4.5 MCG/ACT inhaler inhale 2 puffs by mouth twice a day 10.2 g 5   No current facility-administered medications on file prior to visit.

## 2017-05-08 NOTE — Telephone Encounter (Signed)
Spoke with pt. She is aware of CY's recommendations. Rx has been sent in. Nothing further was needed. 

## 2017-05-08 NOTE — Telephone Encounter (Signed)
Offer biaxin 500 mg, # 14, 1 twice daily with food  Be sure to use VEST twice daily every day now, to help loosen mucus plugs

## 2017-05-09 ENCOUNTER — Ambulatory Visit: Payer: Medicare Other | Admitting: Internal Medicine

## 2017-05-10 ENCOUNTER — Telehealth: Payer: Self-pay | Admitting: Internal Medicine

## 2017-05-10 NOTE — Telephone Encounter (Signed)
Can she reduce the biaxin to one tab daily ?

## 2017-05-10 NOTE — Telephone Encounter (Signed)
Spoke with pt, she states after taking Biaxin she had severe dry mouth but her tongue is white and swollen. This has gone down but she states this happens every time she takes it. She is unable to walk without being out of breath.. She wants to know if she should keep taking it because  her stomach is feeling better. She wants to continue taking it but maybe do every other day because she is allergic to many antibiotics. CY please advise   [facility]  Current Outpatient Medications on File Prior to Visit  Medication Sig Dispense Refill  . acetaminophen (TYLENOL) 325 MG tablet Take 325 mg by mouth every 6 (six) hours as needed for moderate pain or headache.     . albuterol (PROAIR HFA) 108 (90 Base) MCG/ACT inhaler Inhale 2 puffs into the lungs every 4 (four) hours as needed for wheezing or shortness of breath. For shortness of breath 1 Inhaler 11  . albuterol (PROVENTIL) (2.5 MG/3ML) 0.083% nebulizer solution Take 3 mLs (2.5 mg total) by nebulization every 4 (four) hours as needed for wheezing or shortness of breath. 75 mL 12  . alum & mag hydroxide-simeth (MAALOX PLUS) 400-400-40 MG/5ML suspension Take 10 mLs by mouth every 6 (six) hours as needed for indigestion.    . Ascorbic Acid (VITAMIN C PO) Take 1 tablet by mouth daily.     . B Complex Vitamins (VITAMIN B COMPLEX PO) Take 1 tablet by mouth daily as needed (takes when she remembers to take it).     . clarithromycin (BIAXIN) 500 MG tablet Take 1 tablet (500 mg total) by mouth 2 (two) times daily. 14 tablet 0  . diltiazem (CARDIZEM) 120 MG tablet Take 1 tablet (120 mg total) by mouth 2 (two) times daily. 60 tablet 9  . famotidine (PEPCID) 20 MG tablet 1 tablet once daily before meal 30 tablet 12  . Guaifenesin 1200 MG TB12 Take 1 tablet (1,200 mg total) by mouth 2 (two) times daily. 20 each 0  . loratadine (CLARITIN) 10 MG tablet Take 10 mg by mouth daily as needed for allergies.     . Multiple Vitamin (MULTIVITAMIN) tablet Take 1 tablet  by mouth daily.      . predniSONE (DELTASONE) 10 MG tablet 4 X 2 DAYS, 3 X 2 DAYS, 2 X 2 DAYS, 1 X 2 DAYS 20 tablet 0  . Respiratory Therapy Supplies (FLUTTER) DEVI Blow through 4 times per set and repeat 3 sets per day, to loosen lung secretions. 1 each 0  . SYMBICORT 160-4.5 MCG/ACT inhaler inhale 2 puffs by mouth twice a day 10.2 g 5   No current facility-administered medications on file prior to visit.    Allergies  Allergen Reactions  . Aspirin Other (See Comments)    Other reaction(s): Other (See Comments) STOMACH ULCERS. States stomach bubbles, becomes gaseous and irritated  . Clindamycin Other (See Comments)    REACTION: Neck, tongue swelling, SOB \\T \ rash  . Fruit & Vegetable Daily [Nutritional Supplements] Other (See Comments)    Tongue swelling, vomiting  . Levofloxacin Shortness Of Breath  . Penicillin G Anaphylaxis  . Penicillins Swelling  . Shellfish Allergy Anaphylaxis  . Sulfa Antibiotics Anaphylaxis  . Sulfonamide Derivatives Swelling    Throat swelling  . Montelukast Sodium Other (See Comments)    REACTION: hallucination  . Bee Venom Other (See Comments)    Unknown  . Latex Itching and Swelling  . Azithromycin Other (See Comments)    "makes me  gag"  . Fluticasone-Salmeterol Other (See Comments)    REACTION: hoarseness  . Ventolin [Albuterol] Other (See Comments)    cough

## 2017-05-10 NOTE — Telephone Encounter (Signed)
Spoke with patient. She is aware of CY's recs. Advised her to call back if she continues to have any problems.   Nothing else needed at time of call.

## 2017-05-13 ENCOUNTER — Telehealth: Payer: Self-pay | Admitting: Internal Medicine

## 2017-05-13 NOTE — Telephone Encounter (Signed)
Stop Biaxin and wait for stomach to settle down.  Asked her to let us know how she is doing Wednesday or Thursday please.

## 2017-05-13 NOTE — Telephone Encounter (Signed)
Called pt who stated she continued to have reactions to the Biaxin when she began to have diarrhea beginning Friday, 05/10/17 and it did have some blood in it due to having a flare-up with her hemorrhoids.  Pt is still having diarrhea but states that it no longer is bloody as of today, 05/13/17.  Pt stated that her appetite did increase over the weekend. Pt states that due to the diarrhea, she did not take the pill Sunday and also has not taken it yet today.  Dr. Annamaria Boots, please advise on this and what we can do for pt.  Thanks!   Allergies  Allergen Reactions  . Aspirin Other (See Comments)    Other reaction(s): Other (See Comments) STOMACH ULCERS. States stomach bubbles, becomes gaseous and irritated  . Clindamycin Other (See Comments)    REACTION: Neck, tongue swelling, SOB \\T \ rash  . Fruit & Vegetable Daily [Nutritional Supplements] Other (See Comments)    Tongue swelling, vomiting  . Levofloxacin Shortness Of Breath  . Penicillin G Anaphylaxis  . Penicillins Swelling  . Shellfish Allergy Anaphylaxis  . Sulfa Antibiotics Anaphylaxis  . Sulfonamide Derivatives Swelling    Throat swelling  . Montelukast Sodium Other (See Comments)    REACTION: hallucination  . Bee Venom Other (See Comments)    Unknown  . Latex Itching and Swelling  . Azithromycin Other (See Comments)    "makes me gag"  . Fluticasone-Salmeterol Other (See Comments)    REACTION: hoarseness  . Ventolin [Albuterol] Other (See Comments)    cough     Current Outpatient Medications:  .  acetaminophen (TYLENOL) 325 MG tablet, Take 325 mg by mouth every 6 (six) hours as needed for moderate pain or headache. , Disp: , Rfl:  .  albuterol (PROAIR HFA) 108 (90 Base) MCG/ACT inhaler, Inhale 2 puffs into the lungs every 4 (four) hours as needed for wheezing or shortness of breath. For shortness of breath, Disp: 1 Inhaler, Rfl: 11 .  albuterol (PROVENTIL) (2.5 MG/3ML) 0.083% nebulizer solution, Take 3 mLs (2.5 mg total) by  nebulization every 4 (four) hours as needed for wheezing or shortness of breath., Disp: 75 mL, Rfl: 12 .  alum & mag hydroxide-simeth (MAALOX PLUS) 400-400-40 MG/5ML suspension, Take 10 mLs by mouth every 6 (six) hours as needed for indigestion., Disp: , Rfl:  .  Ascorbic Acid (VITAMIN C PO), Take 1 tablet by mouth daily. , Disp: , Rfl:  .  B Complex Vitamins (VITAMIN B COMPLEX PO), Take 1 tablet by mouth daily as needed (takes when she remembers to take it). , Disp: , Rfl:  .  clarithromycin (BIAXIN) 500 MG tablet, Take 1 tablet (500 mg total) by mouth 2 (two) times daily., Disp: 14 tablet, Rfl: 0 .  diltiazem (CARDIZEM) 120 MG tablet, Take 1 tablet (120 mg total) by mouth 2 (two) times daily., Disp: 60 tablet, Rfl: 9 .  famotidine (PEPCID) 20 MG tablet, 1 tablet once daily before meal, Disp: 30 tablet, Rfl: 12 .  Guaifenesin 1200 MG TB12, Take 1 tablet (1,200 mg total) by mouth 2 (two) times daily., Disp: 20 each, Rfl: 0 .  loratadine (CLARITIN) 10 MG tablet, Take 10 mg by mouth daily as needed for allergies. , Disp: , Rfl:  .  Multiple Vitamin (MULTIVITAMIN) tablet, Take 1 tablet by mouth daily.  , Disp: , Rfl:  .  predniSONE (DELTASONE) 10 MG tablet, 4 X 2 DAYS, 3 X 2 DAYS, 2 X 2 DAYS, 1 X 2 DAYS, Disp:  20 tablet, Rfl: 0 .  Respiratory Therapy Supplies (FLUTTER) DEVI, Blow through 4 times per set and repeat 3 sets per day, to loosen lung secretions., Disp: 1 each, Rfl: 0 .  SYMBICORT 160-4.5 MCG/ACT inhaler, inhale 2 puffs by mouth twice a day, Disp: 10.2 g, Rfl: 5

## 2017-05-13 NOTE — Telephone Encounter (Signed)
Called and spoke to pt. Informed her of the recs per CY. Pt verbalized understanding and denied any further questions or concerns at this time.   

## 2017-05-17 ENCOUNTER — Other Ambulatory Visit: Payer: Self-pay

## 2017-05-17 ENCOUNTER — Encounter (HOSPITAL_COMMUNITY): Payer: Self-pay | Admitting: *Deleted

## 2017-05-17 ENCOUNTER — Inpatient Hospital Stay (HOSPITAL_COMMUNITY): Payer: Medicare Other

## 2017-05-17 ENCOUNTER — Emergency Department (HOSPITAL_COMMUNITY): Payer: Medicare Other

## 2017-05-17 ENCOUNTER — Inpatient Hospital Stay (HOSPITAL_COMMUNITY)
Admission: EM | Admit: 2017-05-17 | Discharge: 2017-05-31 | DRG: 208 | Disposition: E | Payer: Medicare Other | Attending: Pulmonary Disease | Admitting: Pulmonary Disease

## 2017-05-17 DIAGNOSIS — Z91013 Allergy to seafood: Secondary | ICD-10-CM | POA: Diagnosis not present

## 2017-05-17 DIAGNOSIS — Z833 Family history of diabetes mellitus: Secondary | ICD-10-CM | POA: Diagnosis not present

## 2017-05-17 DIAGNOSIS — Z4589 Encounter for adjustment and management of other implanted devices: Secondary | ICD-10-CM | POA: Diagnosis not present

## 2017-05-17 DIAGNOSIS — J9622 Acute and chronic respiratory failure with hypercapnia: Secondary | ICD-10-CM | POA: Diagnosis present

## 2017-05-17 DIAGNOSIS — J441 Chronic obstructive pulmonary disease with (acute) exacerbation: Secondary | ICD-10-CM | POA: Diagnosis present

## 2017-05-17 DIAGNOSIS — Z880 Allergy status to penicillin: Secondary | ICD-10-CM | POA: Diagnosis not present

## 2017-05-17 DIAGNOSIS — Z881 Allergy status to other antibiotic agents status: Secondary | ICD-10-CM | POA: Diagnosis not present

## 2017-05-17 DIAGNOSIS — K219 Gastro-esophageal reflux disease without esophagitis: Secondary | ICD-10-CM | POA: Diagnosis present

## 2017-05-17 DIAGNOSIS — R739 Hyperglycemia, unspecified: Secondary | ICD-10-CM | POA: Diagnosis not present

## 2017-05-17 DIAGNOSIS — Z4682 Encounter for fitting and adjustment of non-vascular catheter: Secondary | ICD-10-CM | POA: Diagnosis not present

## 2017-05-17 DIAGNOSIS — Z66 Do not resuscitate: Secondary | ICD-10-CM | POA: Diagnosis present

## 2017-05-17 DIAGNOSIS — Z882 Allergy status to sulfonamides status: Secondary | ICD-10-CM | POA: Diagnosis not present

## 2017-05-17 DIAGNOSIS — J9611 Chronic respiratory failure with hypoxia: Secondary | ICD-10-CM | POA: Diagnosis present

## 2017-05-17 DIAGNOSIS — Z8249 Family history of ischemic heart disease and other diseases of the circulatory system: Secondary | ICD-10-CM

## 2017-05-17 DIAGNOSIS — I248 Other forms of acute ischemic heart disease: Secondary | ICD-10-CM | POA: Diagnosis not present

## 2017-05-17 DIAGNOSIS — G253 Myoclonus: Secondary | ICD-10-CM | POA: Diagnosis not present

## 2017-05-17 DIAGNOSIS — Z91030 Bee allergy status: Secondary | ICD-10-CM

## 2017-05-17 DIAGNOSIS — J9621 Acute and chronic respiratory failure with hypoxia: Principal | ICD-10-CM | POA: Diagnosis present

## 2017-05-17 DIAGNOSIS — Z681 Body mass index (BMI) 19 or less, adult: Secondary | ICD-10-CM

## 2017-05-17 DIAGNOSIS — I469 Cardiac arrest, cause unspecified: Secondary | ICD-10-CM | POA: Diagnosis not present

## 2017-05-17 DIAGNOSIS — E440 Moderate protein-calorie malnutrition: Secondary | ICD-10-CM | POA: Diagnosis not present

## 2017-05-17 DIAGNOSIS — Z91040 Latex allergy status: Secondary | ICD-10-CM

## 2017-05-17 DIAGNOSIS — E876 Hypokalemia: Secondary | ICD-10-CM | POA: Diagnosis not present

## 2017-05-17 DIAGNOSIS — I6782 Cerebral ischemia: Secondary | ICD-10-CM | POA: Diagnosis not present

## 2017-05-17 DIAGNOSIS — J9602 Acute respiratory failure with hypercapnia: Secondary | ICD-10-CM

## 2017-05-17 DIAGNOSIS — R40243 Glasgow coma scale score 3-8, unspecified time: Secondary | ICD-10-CM | POA: Diagnosis present

## 2017-05-17 DIAGNOSIS — Z515 Encounter for palliative care: Secondary | ICD-10-CM | POA: Diagnosis present

## 2017-05-17 DIAGNOSIS — R0602 Shortness of breath: Secondary | ICD-10-CM | POA: Diagnosis present

## 2017-05-17 DIAGNOSIS — I10 Essential (primary) hypertension: Secondary | ICD-10-CM | POA: Diagnosis not present

## 2017-05-17 DIAGNOSIS — E872 Acidosis: Secondary | ICD-10-CM | POA: Diagnosis not present

## 2017-05-17 DIAGNOSIS — J9601 Acute respiratory failure with hypoxia: Secondary | ICD-10-CM | POA: Diagnosis not present

## 2017-05-17 DIAGNOSIS — G931 Anoxic brain damage, not elsewhere classified: Secondary | ICD-10-CM | POA: Diagnosis not present

## 2017-05-17 DIAGNOSIS — J9600 Acute respiratory failure, unspecified whether with hypoxia or hypercapnia: Secondary | ICD-10-CM

## 2017-05-17 DIAGNOSIS — Z4659 Encounter for fitting and adjustment of other gastrointestinal appliance and device: Secondary | ICD-10-CM

## 2017-05-17 DIAGNOSIS — R578 Other shock: Secondary | ICD-10-CM | POA: Diagnosis present

## 2017-05-17 LAB — I-STAT CHEM 8, ED
BUN: 4 mg/dL — AB (ref 6–20)
CHLORIDE: 97 mmol/L — AB (ref 101–111)
Calcium, Ion: 1.12 mmol/L — ABNORMAL LOW (ref 1.15–1.40)
Creatinine, Ser: 0.8 mg/dL (ref 0.44–1.00)
Glucose, Bld: 242 mg/dL — ABNORMAL HIGH (ref 65–99)
HEMATOCRIT: 47 % — AB (ref 36.0–46.0)
Hemoglobin: 16 g/dL — ABNORMAL HIGH (ref 12.0–15.0)
Potassium: 3.2 mmol/L — ABNORMAL LOW (ref 3.5–5.1)
Sodium: 139 mmol/L (ref 135–145)
TCO2: 24 mmol/L (ref 22–32)

## 2017-05-17 LAB — GLUCOSE, CAPILLARY
GLUCOSE-CAPILLARY: 292 mg/dL — AB (ref 65–99)
GLUCOSE-CAPILLARY: 339 mg/dL — AB (ref 65–99)
Glucose-Capillary: 250 mg/dL — ABNORMAL HIGH (ref 65–99)

## 2017-05-17 LAB — I-STAT ARTERIAL BLOOD GAS, ED
ACID-BASE DEFICIT: 3 mmol/L — AB (ref 0.0–2.0)
BICARBONATE: 23.8 mmol/L (ref 20.0–28.0)
O2 SAT: 100 %
PCO2 ART: 51 mmHg — AB (ref 32.0–48.0)
PO2 ART: 440 mmHg — AB (ref 83.0–108.0)
Patient temperature: 98
TCO2: 25 mmol/L (ref 22–32)
pH, Arterial: 7.275 — ABNORMAL LOW (ref 7.350–7.450)

## 2017-05-17 LAB — COMPREHENSIVE METABOLIC PANEL
ALT: 31 U/L (ref 14–54)
AST: 74 U/L — AB (ref 15–41)
Albumin: 3.5 g/dL (ref 3.5–5.0)
Alkaline Phosphatase: 68 U/L (ref 38–126)
Anion gap: 19 — ABNORMAL HIGH (ref 5–15)
BUN: <5 mg/dL — ABNORMAL LOW (ref 6–20)
CHLORIDE: 99 mmol/L — AB (ref 101–111)
CO2: 20 mmol/L — AB (ref 22–32)
CREATININE: 1.15 mg/dL — AB (ref 0.44–1.00)
Calcium: 8.9 mg/dL (ref 8.9–10.3)
GFR, EST AFRICAN AMERICAN: 52 mL/min — AB (ref >60)
GFR, EST NON AFRICAN AMERICAN: 45 mL/min — AB (ref >60)
Glucose, Bld: 244 mg/dL — ABNORMAL HIGH (ref 65–99)
POTASSIUM: 3.3 mmol/L — AB (ref 3.5–5.1)
SODIUM: 138 mmol/L (ref 135–145)
Total Bilirubin: 0.8 mg/dL (ref 0.3–1.2)
Total Protein: 6.7 g/dL (ref 6.5–8.1)

## 2017-05-17 LAB — URINALYSIS, ROUTINE W REFLEX MICROSCOPIC
BILIRUBIN URINE: NEGATIVE
GLUCOSE, UA: 150 mg/dL — AB
Ketones, ur: 5 mg/dL — AB
LEUKOCYTES UA: NEGATIVE
Nitrite: NEGATIVE
Protein, ur: 100 mg/dL — AB
SPECIFIC GRAVITY, URINE: 1.006 (ref 1.005–1.030)
pH: 6 (ref 5.0–8.0)

## 2017-05-17 LAB — CBC WITH DIFFERENTIAL/PLATELET
Basophils Absolute: 0.1 10*3/uL (ref 0.0–0.1)
Basophils Relative: 0 %
Eosinophils Absolute: 0.3 10*3/uL (ref 0.0–0.7)
Eosinophils Relative: 3 %
HEMATOCRIT: 46.4 % — AB (ref 36.0–46.0)
HEMOGLOBIN: 14.7 g/dL (ref 12.0–15.0)
LYMPHS ABS: 4.3 10*3/uL — AB (ref 0.7–4.0)
LYMPHS PCT: 33 %
MCH: 29.8 pg (ref 26.0–34.0)
MCHC: 31.7 g/dL (ref 30.0–36.0)
MCV: 93.9 fL (ref 78.0–100.0)
MONOS PCT: 5 %
Monocytes Absolute: 0.7 10*3/uL (ref 0.1–1.0)
NEUTROS PCT: 59 %
Neutro Abs: 7.8 10*3/uL — ABNORMAL HIGH (ref 1.7–7.7)
Platelets: 267 10*3/uL (ref 150–400)
RBC: 4.94 MIL/uL (ref 3.87–5.11)
RDW: 14.1 % (ref 11.5–15.5)
WBC: 13.2 10*3/uL — AB (ref 4.0–10.5)

## 2017-05-17 LAB — POCT I-STAT 3, ART BLOOD GAS (G3+)
ACID-BASE DEFICIT: 4 mmol/L — AB (ref 0.0–2.0)
Bicarbonate: 24.5 mmol/L (ref 20.0–28.0)
O2 SAT: 100 %
PCO2 ART: 53.5 mmHg — AB (ref 32.0–48.0)
PO2 ART: 481 mmHg — AB (ref 83.0–108.0)
Patient temperature: 34.4
TCO2: 26 mmol/L (ref 22–32)
pH, Arterial: 7.255 — ABNORMAL LOW (ref 7.350–7.450)

## 2017-05-17 LAB — TRIGLYCERIDES: Triglycerides: 54 mg/dL (ref <150)

## 2017-05-17 LAB — STREP PNEUMONIAE URINARY ANTIGEN: STREP PNEUMO URINARY ANTIGEN: NEGATIVE

## 2017-05-17 LAB — TROPONIN I: Troponin I: 0.03 ng/mL (ref <0.03)

## 2017-05-17 LAB — I-STAT TROPONIN, ED: TROPONIN I, POC: 0.01 ng/mL (ref 0.00–0.08)

## 2017-05-17 LAB — MRSA PCR SCREENING: MRSA by PCR: NEGATIVE

## 2017-05-17 LAB — CBG MONITORING, ED: Glucose-Capillary: 215 mg/dL — ABNORMAL HIGH (ref 65–99)

## 2017-05-17 LAB — MAGNESIUM: Magnesium: 2.1 mg/dL (ref 1.7–2.4)

## 2017-05-17 LAB — I-STAT CG4 LACTIC ACID, ED: Lactic Acid, Venous: 9.04 mmol/L (ref 0.5–1.9)

## 2017-05-17 LAB — PROCALCITONIN

## 2017-05-17 LAB — PHOSPHORUS: PHOSPHORUS: 6.9 mg/dL — AB (ref 2.5–4.6)

## 2017-05-17 MED ORDER — METOPROLOL TARTRATE 5 MG/5ML IV SOLN
INTRAVENOUS | Status: AC
  Filled 2017-05-17: qty 5

## 2017-05-17 MED ORDER — METHYLPREDNISOLONE SODIUM SUCC 125 MG IJ SOLR
125.0000 mg | Freq: Once | INTRAMUSCULAR | Status: AC
  Administered 2017-05-17: 125 mg via INTRAVENOUS
  Filled 2017-05-17: qty 2

## 2017-05-17 MED ORDER — MAGNESIUM SULFATE 2 GM/50ML IV SOLN
2.0000 g | Freq: Once | INTRAVENOUS | Status: AC
  Administered 2017-05-17: 2 g via INTRAVENOUS
  Filled 2017-05-17: qty 50

## 2017-05-17 MED ORDER — VANCOMYCIN HCL IN DEXTROSE 1-5 GM/200ML-% IV SOLN
1000.0000 mg | Freq: Once | INTRAVENOUS | Status: AC
  Administered 2017-05-17: 1000 mg via INTRAVENOUS
  Filled 2017-05-17: qty 200

## 2017-05-17 MED ORDER — LACTATED RINGERS IV BOLUS (SEPSIS)
250.0000 mL | Freq: Once | INTRAVENOUS | Status: AC
  Administered 2017-05-17: 250 mL via INTRAVENOUS

## 2017-05-17 MED ORDER — ORAL CARE MOUTH RINSE
15.0000 mL | Freq: Four times a day (QID) | OROMUCOSAL | Status: DC
  Administered 2017-05-17 – 2017-05-19 (×6): 15 mL via OROMUCOSAL

## 2017-05-17 MED ORDER — HEPARIN SODIUM (PORCINE) 5000 UNIT/ML IJ SOLN
5000.0000 [IU] | Freq: Three times a day (TID) | INTRAMUSCULAR | Status: DC
  Administered 2017-05-17 – 2017-05-19 (×5): 5000 [IU] via SUBCUTANEOUS
  Filled 2017-05-17 (×6): qty 1

## 2017-05-17 MED ORDER — MIDAZOLAM HCL 2 MG/2ML IJ SOLN: 1.0000 mg | INTRAMUSCULAR | Status: DC | PRN

## 2017-05-17 MED ORDER — MIDAZOLAM HCL 2 MG/2ML IJ SOLN: 2.0000 mg | INTRAMUSCULAR | Status: DC | PRN

## 2017-05-17 MED ORDER — BUDESONIDE 0.5 MG/2ML IN SUSP
0.5000 mg | Freq: Two times a day (BID) | RESPIRATORY_TRACT | Status: DC
  Administered 2017-05-18 – 2017-05-19 (×3): 0.5 mg via RESPIRATORY_TRACT
  Filled 2017-05-17 (×4): qty 2

## 2017-05-17 MED ORDER — MIDAZOLAM HCL 2 MG/2ML IJ SOLN
1.0000 mg | INTRAMUSCULAR | Status: AC | PRN
  Administered 2017-05-18 (×3): 1 mg via INTRAVENOUS
  Filled 2017-05-17 (×3): qty 2

## 2017-05-17 MED ORDER — ALBUTEROL SULFATE (2.5 MG/3ML) 0.083% IN NEBU
2.5000 mg | INHALATION_SOLUTION | RESPIRATORY_TRACT | Status: DC
  Administered 2017-05-17 – 2017-05-19 (×14): 2.5 mg via RESPIRATORY_TRACT
  Filled 2017-05-17 (×13): qty 3

## 2017-05-17 MED ORDER — METOPROLOL TARTRATE 5 MG/5ML IV SOLN
2.5000 mg | INTRAVENOUS | Status: AC | PRN
  Administered 2017-05-17 (×3): 2.5 mg via INTRAVENOUS
  Filled 2017-05-17 (×2): qty 5

## 2017-05-17 MED ORDER — ALBUTEROL (5 MG/ML) CONTINUOUS INHALATION SOLN
10.0000 mg/h | INHALATION_SOLUTION | Freq: Once | RESPIRATORY_TRACT | Status: AC
  Administered 2017-05-17: 10 mg/h via RESPIRATORY_TRACT
  Filled 2017-05-17: qty 20

## 2017-05-17 MED ORDER — MIDAZOLAM HCL 2 MG/2ML IJ SOLN
INTRAMUSCULAR | Status: AC
  Administered 2017-05-17: 2 mg
  Filled 2017-05-17: qty 2

## 2017-05-17 MED ORDER — DOXYCYCLINE HYCLATE 100 MG IV SOLR
100.0000 mg | Freq: Two times a day (BID) | INTRAVENOUS | Status: DC
  Administered 2017-05-17 – 2017-05-19 (×4): 100 mg via INTRAVENOUS
  Filled 2017-05-17 (×5): qty 100

## 2017-05-17 MED ORDER — IPRATROPIUM BROMIDE 0.02 % IN SOLN
0.5000 mg | Freq: Four times a day (QID) | RESPIRATORY_TRACT | Status: DC
  Administered 2017-05-17 – 2017-05-19 (×8): 0.5 mg via RESPIRATORY_TRACT
  Filled 2017-05-17 (×7): qty 2.5

## 2017-05-17 MED ORDER — VANCOMYCIN HCL IN DEXTROSE 1-5 GM/200ML-% IV SOLN
1000.0000 mg | INTRAVENOUS | Status: DC
  Administered 2017-05-18: 1000 mg via INTRAVENOUS
  Filled 2017-05-17 (×3): qty 200

## 2017-05-17 MED ORDER — METHYLPREDNISOLONE SODIUM SUCC 125 MG IJ SOLR
60.0000 mg | Freq: Four times a day (QID) | INTRAMUSCULAR | Status: DC
  Administered 2017-05-17 – 2017-05-19 (×7): 60 mg via INTRAVENOUS
  Filled 2017-05-17 (×5): qty 0.96
  Filled 2017-05-17: qty 2
  Filled 2017-05-17 (×2): qty 0.96

## 2017-05-17 MED ORDER — POTASSIUM CHLORIDE 10 MEQ/100ML IV SOLN
10.0000 meq | INTRAVENOUS | Status: AC
  Administered 2017-05-17 (×5): 10 meq via INTRAVENOUS
  Filled 2017-05-17 (×6): qty 100

## 2017-05-17 MED ORDER — METOPROLOL TARTRATE 5 MG/5ML IV SOLN: 2.5000 mg | INTRAVENOUS | Status: DC | PRN

## 2017-05-17 MED ORDER — PANTOPRAZOLE SODIUM 40 MG IV SOLR
40.0000 mg | Freq: Every day | INTRAVENOUS | Status: DC
  Administered 2017-05-17 – 2017-05-19 (×3): 40 mg via INTRAVENOUS
  Filled 2017-05-17 (×3): qty 40

## 2017-05-17 MED ORDER — SODIUM CHLORIDE 0.9 % IV SOLN
1000.0000 mg | Freq: Two times a day (BID) | INTRAVENOUS | Status: DC
  Administered 2017-05-17 – 2017-05-19 (×4): 1000 mg via INTRAVENOUS
  Filled 2017-05-17 (×4): qty 10

## 2017-05-17 MED ORDER — GUAIFENESIN ER 1200 MG PO TB12: 1.0000 | ORAL_TABLET | Freq: Two times a day (BID) | ORAL | Status: DC

## 2017-05-17 MED ORDER — LACTATED RINGERS IV SOLN
INTRAVENOUS | Status: DC
  Administered 2017-05-17 – 2017-05-18 (×3): via INTRAVENOUS
  Filled 2017-05-17: qty 1000

## 2017-05-17 MED ORDER — SODIUM CHLORIDE 0.9 % IV BOLUS (SEPSIS)
1000.0000 mL | Freq: Once | INTRAVENOUS | Status: AC
  Administered 2017-05-17: 1000 mL via INTRAVENOUS

## 2017-05-17 MED ORDER — FENTANYL BOLUS VIA INFUSION
25.0000 ug | INTRAVENOUS | Status: DC | PRN
  Filled 2017-05-17: qty 25

## 2017-05-17 MED ORDER — SODIUM CHLORIDE 0.9 % IV SOLN: 1000.0000 mg | Freq: Two times a day (BID) | INTRAVENOUS | Status: DC

## 2017-05-17 MED ORDER — CHLORHEXIDINE GLUCONATE 0.12% ORAL RINSE (MEDLINE KIT)
15.0000 mL | Freq: Two times a day (BID) | OROMUCOSAL | Status: DC
  Administered 2017-05-17 – 2017-05-19 (×4): 15 mL via OROMUCOSAL

## 2017-05-17 MED ORDER — DEXTROSE 5 % IV SOLN
1.0000 g | Freq: Three times a day (TID) | INTRAVENOUS | Status: DC
  Administered 2017-05-18 – 2017-05-19 (×5): 1 g via INTRAVENOUS
  Filled 2017-05-17 (×6): qty 1

## 2017-05-17 MED ORDER — PROPOFOL 1000 MG/100ML IV EMUL
5.0000 ug/kg/min | INTRAVENOUS | Status: DC
  Administered 2017-05-17: 5 ug/kg/min via INTRAVENOUS
  Administered 2017-05-18: 15 ug/kg/min via INTRAVENOUS
  Administered 2017-05-18: 5 ug/kg/min via INTRAVENOUS
  Administered 2017-05-18: 35 ug/kg/min via INTRAVENOUS
  Administered 2017-05-19: 30 ug/kg/min via INTRAVENOUS
  Filled 2017-05-17 (×4): qty 100

## 2017-05-17 MED ORDER — SODIUM CHLORIDE 0.9 % IV SOLN: 250.0000 mL | INTRAVENOUS | Status: DC | PRN

## 2017-05-17 MED ORDER — FENTANYL CITRATE (PF) 100 MCG/2ML IJ SOLN: 50.0000 ug | Freq: Once | INTRAMUSCULAR | Status: DC

## 2017-05-17 MED ORDER — MIDAZOLAM HCL 2 MG/2ML IJ SOLN: 2.0000 mg | Freq: Once | INTRAMUSCULAR | Status: AC

## 2017-05-17 MED ORDER — INSULIN ASPART 100 UNIT/ML ~~LOC~~ SOLN
0.0000 [IU] | SUBCUTANEOUS | Status: DC
  Administered 2017-05-17: 11 [IU] via SUBCUTANEOUS
  Administered 2017-05-17: 5 [IU] via SUBCUTANEOUS
  Administered 2017-05-18: 2 [IU] via SUBCUTANEOUS
  Administered 2017-05-18 – 2017-05-19 (×4): 3 [IU] via SUBCUTANEOUS

## 2017-05-17 MED ORDER — DOCUSATE SODIUM 50 MG/5ML PO LIQD: 100.0000 mg | Freq: Two times a day (BID) | ORAL | Status: DC | PRN

## 2017-05-17 MED ORDER — IPRATROPIUM BROMIDE 0.02 % IN SOLN
1.0000 mg | Freq: Once | RESPIRATORY_TRACT | Status: AC
  Administered 2017-05-17: 1 mg via RESPIRATORY_TRACT
  Filled 2017-05-17: qty 5

## 2017-05-17 MED ORDER — DEXTROSE 5 % IV SOLN
2.0000 g | Freq: Once | INTRAVENOUS | Status: AC
  Administered 2017-05-17: 2 g via INTRAVENOUS

## 2017-05-17 MED ORDER — FENTANYL 2500MCG IN NS 250ML (10MCG/ML) PREMIX INFUSION
25.0000 ug/h | INTRAVENOUS | Status: DC
  Filled 2017-05-17: qty 250

## 2017-05-17 MED ORDER — CEFEPIME HCL 2 G IJ SOLR
2.0000 g | Freq: Once | INTRAMUSCULAR | Status: DC
  Filled 2017-05-17: qty 2

## 2017-05-17 MED ORDER — NOREPINEPHRINE BITARTRATE 1 MG/ML IV SOLN
0.0000 ug/min | Freq: Once | INTRAVENOUS | Status: AC
  Administered 2017-05-17: 5 ug/min via INTRAVENOUS
  Filled 2017-05-17: qty 4

## 2017-05-17 NOTE — Progress Notes (Signed)
Called to room by [place] attending. Patient experiencing myoclonic jerks.  BP stable, tachycardic with irregular rhythm, SpO2 >95%.  Head CT negative earlier today  Stat EEG ordered  Neurology consulted Propofol ordered  Nurse instructed to give VERSED until propofol initiated   Kathi Ludwig, MD Pager # [phone]

## 2017-05-17 NOTE — Progress Notes (Signed)
Pharmacy Antibiotic Note  [name] is a 77 y.o. female admitted on 05/10/2017 with sepsis. Previously on Biaxin for respiratory infection but stopped 1/14 due to diarrhea. CrCl estimated to be 53 mL/min based on weight of 53.6 from  previous encounter 05/01/25.   Pharmacy has been consulted for vancomycin and cefepime dosing.  Plan: Vancomycin load of 1 gram and then 1 gram every 24 hours.  Cefepime load of 2 grams and then 1 gram every 8 hours.  Monitor for clinical progression, renal function, and trough as needed.  Height: 5\' 5"  (165.1 cm) IBW/kg (Calculated) : 57  Temp (24hrs), Avg:96.7 F (35.9 C), Min:96.7 F (35.9 C), Max:96.7 F (35.9 C)  Recent Labs  Lab 05/12/2017 1425 05/18/2017 1439 05/11/2017 1440  WBC 13.2*  --   --   CREATININE  --  0.80  --   LATICACIDVEN  --   --  9.04*    CrCl cannot be calculated (Unknown ideal weight.).    Allergies  Allergen Reactions  . Aspirin Other (See Comments)    Other reaction(s): Other (See Comments) STOMACH ULCERS. States stomach bubbles, becomes gaseous and irritated  . Clindamycin Other (See Comments)    REACTION: Neck, tongue swelling, SOB \\T \ rash  . Fruit & Vegetable Daily [Nutritional Supplements] Other (See Comments)    Tongue swelling, vomiting  . Levofloxacin Shortness Of Breath  . Penicillin G Anaphylaxis  . Penicillins Swelling  . Shellfish Allergy Anaphylaxis  . Sulfa Antibiotics Anaphylaxis  . Sulfonamide Derivatives Swelling    Throat swelling  . Montelukast Sodium Other (See Comments)    REACTION: hallucination  . Bee Venom Other (See Comments)    Unknown  . Latex Itching and Swelling  . Azithromycin Other (See Comments)    "makes me gag"  . Fluticasone-Salmeterol Other (See Comments)    REACTION: hoarseness  . Ventolin [Albuterol] Other (See Comments)    cough    Antimicrobials this admission: Vancomycin 1/18>>  Cefepime 1/18 >>   Dose adjustments this admission: None  Microbiology  results: 1/18 BCx: x 2 pending 1/18 UCx: pending 1/18 Sputum: pending  Thank you for allowing pharmacy to be a part of this patient's care.  Melana Hingle C Roiza Wiedel 05/11/2017 3:05 PM

## 2017-05-17 NOTE — H&P (Signed)
PULMONARY / CRITICAL CARE MEDICINE   Name: [name] MRN: [id number] DOB: [birth year]    ADMISSION DATE:  05/26/2017 CONSULTATION DATE: 1/18  REFERRING MD: Ellender Hose  CHIEF COMPLAINT: Cardiac arrest and acute respiratory failure HISTORY OF PRESENT ILLNESS:   This is 77 year old female patient followed by Dr. Annamaria [name] in our clinic she has a significant history of bronchiectasis, mucous plugging, and reactive airway disease.  He was recently hospitalized back in November 2018 for bronchiectasis cavitary pneumonia she was treated and improved.  She is since been treated once around Christmas again for acute exacerbation of underlying lung disease. On 1/9 she called our office reporting once again increasing chest tightness, cough, weakness, and sputum that was yellow in color.  The symptoms and initiated approximately a week to 10 days prior.  They have been progressive in nature she was offered Biaxin and encouraged to use her chest vest at home.  Over the following days her symptoms continued to worsen.  She had trouble taking the antibiotic siding complications such as nausea, tongue swelling, and dry mouth.  Ultimately she discontinued taking the antibiotic.  Shortness of breath continued to worsen.  On 1/18 she had collapsed in front of her husband.  She was pulseless and he started CPR.  She received CPR for an estimated 15 minutes before review of spontaneous circulation she did require 2 doses of epinephrine during ACLS.  She was intubated initially with a King airway, this was transitioned to oral endotracheal tube on arrival to the ER.  Critical care was asked to admit.  On arrival the patient's GCS is 3 however she had received rocuronium and etomidate to facilitate intubation approximately 60 minutes prior  PAST MEDICAL HISTORY :  She  has a past medical history of Allergic rhinitis, Anxiety, Asthma, HTN (hypertension), Pneumonia, and Shortness of breath dyspnea.  PAST SURGICAL HISTORY: She   has a past surgical history that includes Tubal ligation.  Allergies  Allergen Reactions  . Aspirin Other (See Comments)    Other reaction(s): Other (See Comments) STOMACH ULCERS. States stomach bubbles, becomes gaseous and irritated  . Clindamycin Other (See Comments)    REACTION: Neck, tongue swelling, SOB \\T \ rash  . Fruit & Vegetable Daily [Nutritional Supplements] Other (See Comments)    Tongue swelling, vomiting  . Levofloxacin Shortness Of Breath  . Penicillin G Anaphylaxis  . Penicillins Swelling  . Shellfish Allergy Anaphylaxis  . Sulfa Antibiotics Anaphylaxis  . Sulfonamide Derivatives Swelling    Throat swelling  . Montelukast Sodium Other (See Comments)    REACTION: hallucination  . Bee Venom Other (See Comments)    Unknown  . Latex Itching and Swelling  . Azithromycin Other (See Comments)    "makes me gag"  . Fluticasone-Salmeterol Other (See Comments)    REACTION: hoarseness  . Ventolin [Albuterol] Other (See Comments)    cough    No current facility-administered medications on file prior to encounter.    Current Outpatient Medications on File Prior to Encounter  Medication Sig  . acetaminophen (TYLENOL) 325 MG tablet Take 325 mg by mouth every 6 (six) hours as needed for moderate pain or headache.   . albuterol (PROAIR HFA) 108 (90 Base) MCG/ACT inhaler Inhale 2 puffs into the lungs every 4 (four) hours as needed for wheezing or shortness of breath. For shortness of breath  . albuterol (PROVENTIL) (2.5 MG/3ML) 0.083% nebulizer solution Take 3 mLs (2.5 mg total) by nebulization every 4 (four) hours as needed for wheezing or shortness  of breath.  Marland Kitchen alum & mag hydroxide-simeth (MAALOX PLUS) 400-400-40 MG/5ML suspension Take 10 mLs by mouth every 6 (six) hours as needed for indigestion.  . Ascorbic Acid (VITAMIN C PO) Take 1 tablet by mouth daily.   . B Complex Vitamins (VITAMIN B COMPLEX PO) Take 1 tablet by mouth daily as needed (takes when she remembers to take  it).   . clarithromycin (BIAXIN) 500 MG tablet Take 1 tablet (500 mg total) by mouth 2 (two) times daily.  Marland Kitchen diltiazem (CARDIZEM) 120 MG tablet Take 1 tablet (120 mg total) by mouth 2 (two) times daily.  . famotidine (PEPCID) 20 MG tablet 1 tablet once daily before meal  . Guaifenesin 1200 MG TB12 Take 1 tablet (1,200 mg total) by mouth 2 (two) times daily.  Marland Kitchen loratadine (CLARITIN) 10 MG tablet Take 10 mg by mouth daily as needed for allergies.   . Multiple Vitamin (MULTIVITAMIN) tablet Take 1 tablet by mouth daily.    . predniSONE (DELTASONE) 10 MG tablet 4 X 2 DAYS, 3 X 2 DAYS, 2 X 2 DAYS, 1 X 2 DAYS  . Respiratory Therapy Supplies (FLUTTER) DEVI Blow through 4 times per set and repeat 3 sets per day, to loosen lung secretions.  . SYMBICORT 160-4.5 MCG/ACT inhaler inhale 2 puffs by mouth twice a day    FAMILY HISTORY:  Her indicated that her mother is deceased. She indicated that her father is deceased. She indicated that two of her seven brothers are alive. She indicated that the status of her maternal aunt is unknown.   SOCIAL HISTORY: She  reports that  has never smoked. she has never used smokeless tobacco. She reports that she does not drink alcohol or use drugs.  REVIEW OF SYSTEMS:   Unable  SUBJECTIVE:  Unresponsive  VITAL SIGNS: BP 126/66   Pulse 74   Temp (!) 96.7 F (35.9 C) (Temporal)   Resp 14   Ht 5\' 5"  (1.651 m)   SpO2 100%   BMI 20.67 kg/m    HEMODYNAMICS:    VENTILATOR SETTINGS: Vent Mode: PRVC FiO2 (%):  [100 %] 100 % Set Rate:  [20 bmp] 20 bmp Vt Set:  [440 mL] 440 mL PEEP:  [5 cmH20] 5 cmH20 Plateau Pressure:  [29 cmH20] 29 cmH20  INTAKE / OUTPUT: No intake/output data recorded.  PHYSICAL EXAMINATION: General: [name] unresponsive on ventilator Neuro: GCS 3 has received neuromuscular blockade in the last 60 minutes HEENT: Normocephalic atraumatic no jugular venous distention mucous membranes moist orally  intubated Cardiovascular: Regular rate and rhythm bradycardic on telemetry Lungs: Few's expiratory wheeze with prolonged exhale time marked air trapping with respiratory frequency set at 20 with auto PEEP in excess of 15 cmH2O Abdomen: Soft nontender positive bowel sounds Musculoskeletal: Equal strength and bulk Skin: Warm and dry  LABS:  BMET Recent Labs  Lab 05/18/2017 1425 05/26/2017 1439  NA 138 139  K 3.3* 3.2*  CL 99* 97*  CO2 20*  --   BUN <5* 4*  CREATININE 1.15* 0.80  GLUCOSE 244* 242*    Electrolytes Recent Labs  Lab 05/29/2017 1425  CALCIUM 8.9    CBC Recent Labs  Lab 05/02/2017 1425 05/04/2017 1439  WBC 13.2*  --   HGB 14.7 16.0*  HCT 46.4* 47.0*  PLT 267  --     Coag's No results for input(s): APTT, INR in the last 168 hours.  Sepsis Markers Recent Labs  Lab 05/04/2017 1440  LATICACIDVEN 9.04*  ABG Recent Labs  Lab 05/08/2017 1512  PHART 7.275*  PCO2ART 51.0*  PO2ART 440.0*    Liver Enzymes Recent Labs  Lab 05/25/2017 1425  AST 74*  ALT 31  ALKPHOS 68  BILITOT 0.8  ALBUMIN 3.5    Cardiac Enzymes No results for input(s): TROPONINI, PROBNP in the last 168 hours.  Glucose Recent Labs  Lab 05/10/2017 1422  GLUCAP 215*    Imaging Dg Chest Portable 1 View  Result Date: 05/23/2017 CLINICAL DATA:  Status post intubation. EXAM: PORTABLE CHEST 1 VIEW COMPARISON:  Radiographs of April 25, 2017. FINDINGS: The heart size and mediastinal contours are within normal limits. Both lungs are clear. Endotracheal tube is seen projected over tracheal air shadow with distal tip approximately 4 cm above the carina. Atherosclerosis of thoracic aorta is noted. No pneumothorax or pleural effusion is noted. The visualized skeletal structures are unremarkable. IMPRESSION: Aortic atherosclerosis. Endotracheal tube in grossly good position. No acute cardiopulmonary abnormality seen. Electronically Signed   By: Marijo Conception, M.D.   On: 05/12/2017 14:49      STUDIES:  CT head 1/18  CULTURES: Blood cultures 1/18 Urine culture 1/18 Respiratory viral panel 1/18 Urine strep 1/18 Urine Legionella panel 1/18 Sputum culture 1/18  ANTIBIOTICS: Vancomycin 1/18 Cefepime 1/18 Doxy 1/18>>>  SIGNIFICANT EVENTS:   LINES/TUBES: Oral endotracheal doing 1/18  DISCUSSION: This is a 77 year old female with a history of chronic respiratory failure in the setting of bronchiectasis and resultant obstructive lung disease.  Now admitted status post cardiopulmonary arrest in the setting of what appeared to be progressive respiratory failure from underlying respiratory infection.  She has significant bronchospasm, with marked air trapping requiring adjustment of minute ventilation down to a frequency of 14.  She remains unresponsive with a GCS of 3.  The primary concern here is for an anoxic injury with.  They estimated time to [place] was 15 minutes.  We will admit her to the intensive care, continue mechanical ventilation, provide steroids, bronchodilators, empiric antibiotics, and supportive care.  ASSESSMENT / PLAN:  Acute on chronic hypercarbic and hypoxic respiratory failure in the setting of acute exacerbation chronic obstructive lung disease Severe bronchospasm with air trapping History of bronchiectasis -Endotracheal tube in satisfactory position no clear infiltrate on film Plan Continue full ventilator support, have adjusted minute ventilation given  severe air trapping Repeat arterial blood gas, given degree of air trapping we will have to accept some level of permissive hypercarbia Scheduled bronchodilators: Starting with albuterol every 3, Atrovent every 6 Scheduled systemic steroids: Solu-Medrol 60 every 6, also inhaled budesonide twice daily Will send: Sputum, respiratory viral panel, and urinary antigens Cycle pro calcitonin Empiric antibiotics: Day #1 vancomycin, Maxipime, and add doxycycline for atypical coverage.  She has had  sensitivities to macrolides in the past as well as sensitivity to Levaquin  Circulatory shock: Status post cardiac arrest Seemingly secondary to by respiratory event.  She is rapidly weaning off pressors Plan Cycle cardiac enzymes Continue IV fluids She is currently weaning off from Levophed, will hold off from central access however this may be required  Acute encephalopathy.  Highly concerning for anoxic brain injury Downtime was estimated at 15 minutes prior to review of spontaneous circulation Her GCS is currently 3 however she has recently received neuromuscular blockade within the last hour Plan CT head Continue supportive care Minimize sedating medications RASS goal -2 assuming her ventilator mechanics will support this Consider EEG in a.m. if no improvement in mental status Consider neurology consultation for prognostics  if mental status does not improve  Hypokalemia Plan Replace and recheck  Hyperglycemia Plan Sliding scale insulin  FAMILY  - Updates:   - Inter-disciplinary family meet or Palliative Care meeting due by: 1/25   My critical care times 45 minutes Erick Colace ACNP-BC [place] Pager # [phone] OR # [phone] if no answer   05/01/2017, 4:03 PM

## 2017-05-17 NOTE — Progress Notes (Addendum)
Nursing staff concerned about patient having intermittent jerking movements on her entire body and her eyes open during these episodes. Also concerned about patient's heart rate being in the 120s-130s. I saw and evaluated the patient.  No jerking movements of body noted when I was present in the room. They could possibly be related to anoxic brain injury vs inadequate sedation. Staff have been instructed by my attending to give Versed 2 mg once at this time. Order for Versed 1 mg q15 min prn (maximum 3 doses) in chart. Hold versed if SBP <100. RAAS goal -2.  Monitor showing sinus tachycardia with HR in the 120s-130s. Tachycardic on exam with regular rhythm. EKG done on admission showing sinus tachycardia. Patient has a history of HTN and takes a calcium channel blocker at home (currently not receiving). Review of telemetry showing sinus tachycardia. Repeat EKG showing sinus tachycardia with HR 132. SBP dropped to 90s and HR in 130s after patient received Versed 2 mg for sedation. Plan is to a 250cc fluid bolus as she appears volume depleted. Will hold off from giving a beta blocker at this time.   Addendum 10:02 pm: SBP in 150s now, continues to have sinus tachycardia with HR in 130s.  -Metoprolol 2.5 mg q5 min prn (maximum 3 doses). Hold if SBP <100 or HR <120.

## 2017-05-17 NOTE — ED Notes (Signed)
To CT at this time.

## 2017-05-17 NOTE — Consult Note (Signed)
Neurology Consultation Reason for Consult: Myoclonic jerks  Referring Physician: Dr Vaughan Browner   History is obtained from:chart review  HPI: [name] is a 77 y.o. female with PMH of asthma, bronchiectasis collapsed on 1/18. Found to be pulseless by EMS, resuscitated with ROSC obtained in 15 min 2 doses of epinephrine. Patient intuabted and sedated. Patient noted to have frequent myoclonic jerks around 10 pm and neurology was consulted.     ROS:  Unable to obtain due to altered mental status.   Past Medical History:  Diagnosis Date  . Allergic rhinitis   . Anxiety   . Asthma   . HTN (hypertension)   . Pneumonia   . Shortness of breath dyspnea      Family History  Problem Relation Age of Onset  . Diabetes Father   . Asthma Father   . Allergies Father   . Mental illness Mother        alzheimer's  . Allergies Brother   . Allergies Brother   . Heart disease Maternal Aunt      Social History:  reports that  has never smoked. she has never used smokeless tobacco. She reports that she does not drink alcohol or use drugs.   Exam: Current vital signs: BP (!) 169/95   Pulse (!) 133   Temp (!) 97.5 F (36.4 C)   Resp (!) 39   Ht 5\' 5"  (1.651 m)   Wt 55.4 kg (122 lb 2.2 oz)   SpO2 97%   BMI 20.32 kg/m  Vital signs in last 24 hours: Temp:  [94.1 F (34.5 C)-97.5 F (36.4 C)] 97.5 F (36.4 C) (01/18 2300) Pulse Rate:  [63-137] 133 (01/18 2300) Resp:  [13-39] 39 (01/18 2300) BP: (68-173)/(52-96) 169/95 (01/18 2300) SpO2:  [92 %-100 %] 97 % (01/18 2300) FiO2 (%):  [40 %-100 %] 40 % (01/18 2025) Weight:  [55.4 kg (122 lb 2.2 oz)] 55.4 kg (122 lb 2.2 oz) (01/18 1800)   Physical Exam  Constitutional: Appears well-developed and well-nourished.  Psych: Affect appropriate to situation Eyes: No scleral injection HENT: No OP obstrucion Head: Normocephalic.  Cardiovascular: Normal rate and regular rhythm.  Respiratory: Effort normal, non-labored breathing GI: Soft.  No  distension. There is no tenderness.  Skin: WDI  Neuro: Mental Status: Patient does not respond to verbal stimuli.  Does not respond to deep sternal rub.  Does not follow commands.  No verbalizations noted.  Cranial Nerves: II: patient does not respond confrontation bilaterally, pupils right 85mm, left 20mm, sluggish  III,IV,VI: doll's response absent bilaterally V,VII: corneal reflex: absent VIII: patient does not respond to verbal stimuli IX,X: gag reflex absent   Motor: Extremities flaccid throughout.  No spontaneous movement noted.  No purposeful movements noted. Frequent myoclonic jerks of both upper extremities.  Sensory: Does not respond to noxious stimuli in any extremity. Deep Tendon Reflexes:  Absent throughout. Plantars: mute bilaterally  Cerebellar: Unable to perform      I have reviewed labs in epic and the results pertinent to this consultation are:   I have reviewed the images obtained:  ASSESSMENT AND PLAN  Anoxic Brain Injury s/p PEA arrest  Consult for frequent myclonus Loaded with IV Keppra 1g and continue 1g BID  If continues load with Depakote  Will obtain stat EEG - showed anoxic myoclonus Can start Propofol   Will continue to follow for prognostication. Given current exam and EEG findings, likely will have severe anoxic injury.   Karena Addison Aroor MD [facility]  [phone]  If 7pm to 7am, please call on call as listed on AMION.

## 2017-05-17 NOTE — Progress Notes (Signed)
CRITICAL VALUE ALERT  Critical Value:  Troponin 0.03  Date & Time Notied:  04/30/2017 2305  Provider Notified: Dr. Berline Lopes  Orders Received/Actions taken: continue to monitor

## 2017-05-17 NOTE — ED Provider Notes (Signed)
[facility] 52[facility]ICAL ICU Provider Note   CSN: [id number] Arrival date & time: 05/05/2017  1417     History   Chief Complaint Chief Complaint  Patient presents with  . Cardiac Arrest    HPI [name] is a 77 y.o. female.  HPI   45 old female with extensive past medical history as below including severe COPD here with CPR and witnessed arrest.  According to report from EMS, the patient has been feeling unwell for the last week.  Per records, the patient has been treated for pneumonia and was also having some diarrhea.  She has been feeling unwell.  She began to complain of worsening shortness of breath today and then had a witnessed arrest.  On EMS arrival, the patient was apneic, and PEA arrest.  She was given approximately 10 minutes of CPR, epinephrine, and aching airway was placed with subsequent return of circulation.  She has begun breathing on her own in route.  No other motor responses noted.  Husband was with her and began CPR immediately.  Level 5 caveat invoked as remainder of history, ROS, and physical exam limited due to patient's AMS, resp failure, unresponsiveness.   Past Medical History:  Diagnosis Date  . Allergic rhinitis   . Anxiety   . Asthma   . HTN (hypertension)   . Pneumonia   . Shortness of breath dyspnea     Patient Active Problem List   Diagnosis Date Noted  . Acute respiratory failure ([place]) 05/12/2017  . Hypokalemia 05/27/2017  . Hyperglycemia 05/13/2017  . Anoxic encephalopathy ([place]) 05/13/2017  . Pneumonia 02/25/2017  . Chronic respiratory failure with hypoxia ([place]) 09/22/2016  . Constipation 06/12/2016  . Acute asthma exacerbation 05/12/2016  . Acute exacerbation of chronic obstructive pulmonary disease (COPD) ([place]) 05/12/2016  . Wheezing 03/05/2016  . GERD (gastroesophageal reflux disease) 02/14/2016  . Tongue swelling 07/12/2015  . PNA (pneumonia) 01/27/2015  . Community acquired pneumonia 01/27/2015  . Reactive  depression (situational) 10/05/2014  . Oral thrush 04/01/2014  . Well adult exam 04/10/2013  . URI, acute 03/26/2012  . Bronchiectasis with retained secretions 11/10/2010  . COUGH 01/18/2010  . TACHYCARDIA 10/29/2008  . DYSPNEA 10/29/2008  . Chronic bronchitis NEC 07/12/2008  . CERUMEN IMPACTION 04/01/2008  . ALLERGIC RHINITIS 11/13/2007  . LEG PAIN 09/10/2007  . Rash and other nonspecific skin eruption 09/10/2007  . HOARSENESS 09/10/2007  . CYSTITIS 05/09/2007  . Anxiety state 02/04/2007  . Hypertension 02/04/2007  . DEPRESSION 01/30/2007  . Asthma with bronchitis 11/27/2006    Past Surgical History:  Procedure Laterality Date  . TUBAL LIGATION      OB History    No data available       Home Medications    Prior to Admission medications   Medication Sig Start Date End Date Taking? Authorizing Provider  acetaminophen (TYLENOL) 325 MG tablet Take 325 mg by mouth every 6 (six) hours as needed for moderate pain or headache.    Yes [provider]  albuterol (PROAIR HFA) 108 (90 Base) MCG/ACT inhaler Inhale 2 puffs into the lungs every 4 (four) hours as needed for wheezing or shortness of breath. For shortness of breath 03/05/16  Yes Biagio Borg, MD  albuterol (PROVENTIL) (2.5 MG/3ML) 0.083% nebulizer solution Take 3 mLs (2.5 mg total) by nebulization every 4 (four) hours as needed for wheezing or shortness of breath. 04/25/17  Yes Deneise Lever, MD  SYMBICORT 160-4.5 MCG/ACT inhaler inhale 2 puffs  by mouth twice a day 11/19/16  Yes Young, Tarri Fuller D, MD  alum & mag hydroxide-simeth (MAALOX PLUS) 400-400-40 MG/5ML suspension Take 10 mLs by mouth every 6 (six) hours as needed for indigestion.    [provider]  Ascorbic Acid (VITAMIN C PO) Take 1 tablet by mouth daily.     [provider]  B Complex Vitamins (VITAMIN B COMPLEX PO) Take 1 tablet by mouth daily as needed (takes when she remembers to take it).     [provider]    clarithromycin (BIAXIN) 500 MG tablet Take 1 tablet (500 mg total) by mouth 2 (two) times daily. 05/08/17   Deneise Lever, MD  diltiazem (CARDIZEM) 120 MG tablet Take 1 tablet (120 mg total) by mouth 2 (two) times daily. 12/27/16   Plotnikov, Evie Lacks, MD  famotidine (PEPCID) 20 MG tablet 1 tablet once daily before meal 04/25/17   Young, Tarri Fuller D, MD  Guaifenesin 1200 MG TB12 Take 1 tablet (1,200 mg total) by mouth 2 (two) times daily. 03/26/15   Lawyer, Harrell Gave, PA-C  loratadine (CLARITIN) 10 MG tablet Take 10 mg by mouth daily as needed for allergies.  08/16/10   Plotnikov, Evie Lacks, MD  Multiple Vitamin (MULTIVITAMIN) tablet Take 1 tablet by mouth daily.      [provider]  predniSONE (DELTASONE) 10 MG tablet 4 X 2 DAYS, 3 X 2 DAYS, 2 X 2 DAYS, 1 X 2 DAYS 04/25/17   Deneise Lever, MD  Respiratory Therapy Supplies (FLUTTER) DEVI Blow through 4 times per set and repeat 3 sets per day, to loosen lung secretions. 07/16/14   Deneise Lever, MD    Family History Family History  Problem Relation Age of Onset  . Diabetes Father   . Asthma Father   . Allergies Father   . Mental illness Mother        alzheimer's  . Allergies Brother   . Allergies Brother   . Heart disease Maternal Aunt     Social History Social History   Tobacco Use  . Smoking status: Never Smoker  . Smokeless tobacco: Never Used  . Tobacco comment: father smoked, worked w/smokers  Substance Use Topics  . Alcohol use: No  . Drug use: No     Allergies   Aspirin; Clindamycin; Fruit & vegetable daily [nutritional supplements]; Levofloxacin; Penicillin g; Penicillins; Shellfish allergy; Sulfa antibiotics; Sulfonamide derivatives; Montelukast sodium; Bee venom; Latex; Azithromycin; Fluticasone-salmeterol; and Ventolin [albuterol]   Review of Systems Review of Systems  Unable to perform ROS: Patient unresponsive     Physical Exam Updated Vital Signs BP (!) 141/74   Pulse 65   Temp (!) 96.7  F (35.9 C) (Temporal)   Resp 14   Ht 5' 5"  (1.651 m)   Wt 55.4 kg (122 lb 2.2 oz)   SpO2 100%   BMI 20.32 kg/m   Physical Exam  Constitutional: She appears well-developed. She appears distressed. She is intubated.  Chronically ill-appearing, King airway in place, regular respirations noted  HENT:  Head: Normocephalic and atraumatic.  Moist mucous membranes noted.  Tearing of bilateral eyes.  Eyes: Conjunctivae are normal.  Neck: Neck supple.  Cardiovascular: Regular rhythm and normal heart sounds. Tachycardia present. Exam reveals no friction rub.  No murmur heard. Pulmonary/Chest: She is intubated. She is in respiratory distress. She has decreased breath sounds. She has wheezes. She has no rales.  King airway in place, spontaneous respirations noted.  Diffuse wheezing with markedly diminished aeration.  Abdominal: She exhibits no distension.  Musculoskeletal: She exhibits no edema.  Neurological: She exhibits normal muscle tone.  Spontaneous respirations noted.  Corneal reflexes noted and intact gag.  No response to painful stimuli bilateral upper and lower extremities.  Skin: Skin is warm. Capillary refill takes less than 2 seconds.  Nursing note and vitals reviewed.    ED Treatments / Results  Labs (all labs ordered are listed, but only abnormal results are displayed) Labs Reviewed  COMPREHENSIVE METABOLIC PANEL - Abnormal; Notable for the following components:      Result Value   Potassium 3.3 (*)    Chloride 99 (*)    CO2 20 (*)    Glucose, Bld 244 (*)    BUN <5 (*)    Creatinine, Ser 1.15 (*)    AST 74 (*)    GFR calc non Af Amer 45 (*)    GFR calc Af Amer 52 (*)    Anion gap 19 (*)    All other components within normal limits  CBC WITH DIFFERENTIAL/PLATELET - Abnormal; Notable for the following components:   WBC 13.2 (*)    HCT 46.4 (*)    Neutro Abs 7.8 (*)    Lymphs Abs 4.3 (*)    All other components within normal limits  URINALYSIS, ROUTINE W REFLEX  MICROSCOPIC - Abnormal; Notable for the following components:   APPearance HAZY (*)    Glucose, UA 150 (*)    Hgb urine dipstick SMALL (*)    Ketones, ur 5 (*)    Protein, ur 100 (*)    Bacteria, UA RARE (*)    Squamous Epithelial / LPF 0-5 (*)    All other components within normal limits  PHOSPHORUS - Abnormal; Notable for the following components:   Phosphorus 6.9 (*)    All other components within normal limits  GLUCOSE, CAPILLARY - Abnormal; Notable for the following components:   Glucose-Capillary 292 (*)    All other components within normal limits  CBG MONITORING, ED - Abnormal; Notable for the following components:   Glucose-Capillary 215 (*)    All other components within normal limits  I-STAT CG4 LACTIC ACID, ED - Abnormal; Notable for the following components:   Lactic Acid, Venous 9.04 (*)    All other components within normal limits  I-STAT CHEM 8, ED - Abnormal; Notable for the following components:   Potassium 3.2 (*)    Chloride 97 (*)    BUN 4 (*)    Glucose, Bld 242 (*)    Calcium, Ion 1.12 (*)    Hemoglobin 16.0 (*)    HCT 47.0 (*)    All other components within normal limits  I-STAT ARTERIAL BLOOD GAS, ED - Abnormal; Notable for the following components:   pH, Arterial 7.275 (*)    pCO2 arterial 51.0 (*)    pO2, Arterial 440.0 (*)    Acid-base deficit 3.0 (*)    All other components within normal limits  POCT I-STAT 3, ART BLOOD GAS (G3+) - Abnormal; Notable for the following components:   pH, Arterial 7.255 (*)    pCO2 arterial 53.5 (*)    pO2, Arterial 481.0 (*)    Acid-base deficit 4.0 (*)    All other components within normal limits  CULTURE, BLOOD (ROUTINE X 2)  CULTURE, BLOOD (ROUTINE X 2)  URINE CULTURE  CULTURE, EXPECTORATED SPUTUM-ASSESSMENT  RESPIRATORY PANEL BY PCR  CULTURE, RESPIRATORY (NON-EXPECTORATED)  MRSA PCR SCREENING  MAGNESIUM  TROPONIN I  PROCALCITONIN  TROPONIN  I  TROPONIN I  STREP PNEUMONIAE URINARY ANTIGEN  LEGIONELLA  PNEUMOPHILA SEROGP 1 UR AG  BLOOD GAS, ARTERIAL  PROCALCITONIN  CBC  BLOOD GAS, ARTERIAL  MAGNESIUM  PHOSPHORUS  BASIC METABOLIC PANEL  I-STAT TROPONIN, ED  I-STAT CG4 LACTIC ACID, ED    EKG  EKG Interpretation  Date/Time:  Friday May 17 2017 14:27:04 EST Ventricular Rate:  127 PR Interval:    QRS Duration: 103 QT Interval:  341 QTC Calculation: 496 R Axis:   -52 Text Interpretation:  Sinus tachycardia RSR' in V1 or V2, right VCD or RVH Inferior infarct, old Lateral leads are also involved Since last tracing, RBBB is more evident Confirmed by Duffy Bruce [phone]) on 05/05/2017 3:10:55 PM       Radiology Ct Head Wo Contrast  Result Date: 05/29/2017 CLINICAL DATA:  Status post CPR. EXAM: CT HEAD WITHOUT CONTRAST TECHNIQUE: Contiguous axial images were obtained from the base of the skull through the vertex without intravenous contrast. COMPARISON:  None. FINDINGS: Brain: There is no evidence for acute hemorrhage, hydrocephalus, mass lesion, or abnormal extra-axial fluid collection. No definite CT evidence for acute infarction. Preservation of gray-white differentiation without evidence for global anoxia. Patchy low attenuation in the deep hemispheric and periventricular white matter is nonspecific, but likely reflects chronic microvascular ischemic demyelination. Vascular: No hyperdense vessel or unexpected calcification. Skull: No evidence for fracture. No worrisome lytic or sclerotic lesion. Sinuses/Orbits: Chronic mucosal disease is identified in the ethmoid air cells and right sphenoid sinus. Frontal sinuses and mastoid air cells are clear. No evidence for middle ear fluid. Visualized portions of the globes and intraorbital fat are unremarkable. Other: None. IMPRESSION: 1. No acute intracranial abnormality. Specifically, no acute hemorrhage. Preservation of gray-white differentiation. 2. Chronic small vessel white matter ischemic disease. Electronically Signed   By: Misty Stanley  M.D.   On: 05/20/2017 16:48   Dg Chest Portable 1 View  Result Date: 05/11/2017 CLINICAL DATA:  Status post intubation. EXAM: PORTABLE CHEST 1 VIEW COMPARISON:  Radiographs of April 25, 2017. FINDINGS: The heart size and mediastinal contours are within normal limits. Both lungs are clear. Endotracheal tube is seen projected over tracheal air shadow with distal tip approximately 4 cm above the carina. Atherosclerosis of thoracic aorta is noted. No pneumothorax or pleural effusion is noted. The visualized skeletal structures are unremarkable. IMPRESSION: Aortic atherosclerosis. Endotracheal tube in grossly good position. No acute cardiopulmonary abnormality seen. Electronically Signed   By: Marijo Conception, M.D.   On: 05/11/2017 14:49    Procedures .Critical Care Performed by: Duffy Bruce, MD Authorized by: Duffy Bruce, MD   Critical care provider statement:    Critical care time (minutes):  6   Critical care time was exclusive of:  Separately billable procedures and treating other patients and teaching time   Critical care was necessary to treat or prevent imminent or life-threatening deterioration of the following conditions:  Respiratory failure, circulatory failure and cardiac failure   Critical care was time spent personally by me on the following activities:  Development of treatment plan with patient or surrogate, discussions with consultants, evaluation of patient's response to treatment, examination of patient, obtaining history from patient or surrogate, ordering and performing treatments and interventions, ordering and review of laboratory studies, ordering and review of radiographic studies, pulse oximetry, re-evaluation of patient's condition and review of old charts   I assumed direction of critical care for this patient from another provider in my specialty: no   Procedure Name: Intubation  Date/Time: 05/21/2017 6:45 PM Performed by: Duffy Bruce, MD Pre-anesthesia  Checklist: Patient identified, Patient being monitored, Emergency Drugs available, Timeout performed and Suction available Oxygen Delivery Method: Non-rebreather mask Preoxygenation: Pre-oxygenation with 100% oxygen Induction Type: Rapid sequence Ventilation: Mask ventilation without difficulty Laryngoscope Size: Glidescope and 3 Grade View: Grade I Tube size: 7.5 mm Number of attempts: 1 Airway Equipment and Method: Video-laryngoscopy Placement Confirmation: ETT inserted through vocal cords under direct vision,  CO2 detector and Breath sounds checked- equal and bilateral Tube secured with: ETT holder Dental Injury: Teeth and Oropharynx as per pre-operative assessment  Difficulty Due To: Difficulty was unanticipated Future Recommendations: Recommend- induction with short-acting agent, and alternative techniques readily available      (including critical care time)  Medications Ordered in ED Medications  fentaNYL (SUBLIMAZE) bolus via infusion 25 mcg (not administered)  midazolam (VERSED) injection 1 mg (not administered)  vancomycin (VANCOCIN) IVPB 1000 mg/200 mL premix (not administered)  ceFEPIme (MAXIPIME) 1 g in dextrose 5 % 50 mL IVPB (not administered)  methylPREDNISolone sodium succinate (SOLU-MEDROL) 125 mg/2 mL injection 60 mg (not administered)  albuterol (PROVENTIL) (2.5 MG/3ML) 0.083% nebulizer solution 2.5 mg (2.5 mg Nebulization Given 05/18/2017 1655)  ipratropium (ATROVENT) nebulizer solution 0.5 mg (0.5 mg Nebulization Given 05/01/2017 1656)  budesonide (PULMICORT) nebulizer solution 0.5 mg (not administered)  pantoprazole (PROTONIX) injection 40 mg (not administered)  chlorhexidine gluconate (MEDLINE KIT) (PERIDEX) 0.12 % solution 15 mL (not administered)  MEDLINE mouth rinse (not administered)  docusate (COLACE) 50 MG/5ML liquid 100 mg (not administered)  heparin injection 5,000 Units (not administered)  lactated ringers infusion ( Intravenous New Bag/Given 05/02/2017  1815)  potassium chloride 10 mEq in 100 mL IVPB (10 mEq Intravenous New Bag/Given 05/23/2017 1801)  insulin aspart (novoLOG) injection 0-15 Units (not administered)  doxycycline (VIBRAMYCIN) 100 mg in dextrose 5 % 250 mL IVPB (100 mg Intravenous New Bag/Given 05/16/2017 1821)  sodium chloride 0.9 % bolus 1,000 mL (0 mLs Intravenous Stopped 05/28/2017 1600)  sodium chloride 0.9 % bolus 1,000 mL (1,000 mLs Intravenous Transfusing/Transfer 05/08/2017 1726)  methylPREDNISolone sodium succinate (SOLU-MEDROL) 125 mg/2 mL injection 125 mg (125 mg Intravenous Given 05/14/2017 1503)  albuterol (PROVENTIL,VENTOLIN) solution continuous neb (10 mg/hr Nebulization Given 05/27/2017 1448)  ipratropium (ATROVENT) nebulizer solution 1 mg (1 mg Nebulization Given 05/30/2017 1448)  magnesium sulfate IVPB 2 g 50 mL (0 g Intravenous Stopped 05/12/2017 1600)  norepinephrine (LEVOPHED) 4 mg in dextrose 5 % 250 mL (0.016 mg/mL) infusion (0 mcg/min Intravenous Stopped 05/28/2017 1541)  vancomycin (VANCOCIN) IVPB 1000 mg/200 mL premix (0 mg Intravenous Stopped 05/26/2017 1644)  ceFEPIme (MAXIPIME) 2 g in dextrose 5 % 50 mL IVPB (0 g Intravenous Stopped 05/11/2017 1644)     Initial Impression / Assessment and Plan / ED Course  I have reviewed the triage vital signs and the nursing notes.  Pertinent labs & imaging results that were available during my care of the patient were reviewed by me and considered in my medical decision making (see chart for details).    77 year old female here with witnessed PEA arrest.  On arrival, Edison Pace airway is in place.  The patient is making spontaneous respirations.  Based on history, suspect acute on chronic hypoxic and hypercapnic respiratory failure contributing to PEA arrest.  She is in normal sinus rhythm here.  She does have some mild hypotension which I suspect is due to breath stacking as well as possible sepsis, given that she is currently being treated for pneumonia.  Code sepsis initiated.  Endotracheal tube  were placed by myself, patient tolerated well.  Patient has significant lactic acidosis and respiratory acidosis.  Fluids, antibiotics given.  Steroids and magnesium also given.  Patient has markedly diminished breath sounds but seems to be improving with bronchodilator therapy.  Will admit to the ICU.  Final Clinical Impressions(s) / ED Diagnoses   Final diagnoses:  Cardiac arrest ([place])  Acute respiratory failure with hypoxia and hypercapnia [facility])    ED Discharge Orders    None       Duffy Bruce, MD 05/14/2017 [phone]

## 2017-05-17 NOTE — Progress Notes (Signed)
Patient came in intubated by EMS with a king airway, ED physician replaced with a 7.5 ETT taped at 24 cm at lip, good color change on ETCO2 detector, SATS 1005, placed on above vent settings, MD aware.

## 2017-05-17 NOTE — ED Notes (Signed)
Attempted report 

## 2017-05-17 NOTE — Assessment & Plan Note (Signed)
acute combined on chronic hypoxemic

## 2017-05-17 NOTE — ED Notes (Signed)
Husband and son at bedside.  

## 2017-05-17 NOTE — ED Triage Notes (Signed)
Patient presents to ed via [facility] states patient was c/o sob has been treated for resp infection for several days, patient collapsed in front of family and they started CPR, ems states they started CPR 1335 gave 2 epip  Roc at 1347. ST strong pulses. Upon arrival patient was being bagged. King airway intact IO right tib/fib. Patient was getting Albuterol 10 being bagged in.

## 2017-05-18 ENCOUNTER — Inpatient Hospital Stay (HOSPITAL_COMMUNITY): Payer: Medicare Other

## 2017-05-18 DIAGNOSIS — G253 Myoclonus: Secondary | ICD-10-CM

## 2017-05-18 DIAGNOSIS — G931 Anoxic brain damage, not elsewhere classified: Secondary | ICD-10-CM

## 2017-05-18 DIAGNOSIS — E440 Moderate protein-calorie malnutrition: Secondary | ICD-10-CM

## 2017-05-18 LAB — BASIC METABOLIC PANEL
ANION GAP: 11 (ref 5–15)
BUN: 5 mg/dL — ABNORMAL LOW (ref 6–20)
CALCIUM: 8.6 mg/dL — AB (ref 8.9–10.3)
CO2: 26 mmol/L (ref 22–32)
Chloride: 101 mmol/L (ref 101–111)
Creatinine, Ser: 1 mg/dL (ref 0.44–1.00)
GFR calc Af Amer: >60 mL/min (ref >60)
GFR calc non Af Amer: 53 mL/min — ABNORMAL LOW (ref >60)
GLUCOSE: 208 mg/dL — AB (ref 65–99)
POTASSIUM: 4.1 mmol/L (ref 3.5–5.1)
Sodium: 138 mmol/L (ref 135–145)

## 2017-05-18 LAB — CBC
HCT: 41.5 % (ref 36.0–46.0)
Hemoglobin: 13.4 g/dL (ref 12.0–15.0)
MCH: 29.5 pg (ref 26.0–34.0)
MCHC: 32.3 g/dL (ref 30.0–36.0)
MCV: 91.2 fL (ref 78.0–100.0)
Platelets: 228 10*3/uL (ref 150–400)
RBC: 4.55 MIL/uL (ref 3.87–5.11)
RDW: 14 % (ref 11.5–15.5)
WBC: 20 10*3/uL — AB (ref 4.0–10.5)

## 2017-05-18 LAB — RESPIRATORY PANEL BY PCR
Adenovirus: NOT DETECTED
Bordetella pertussis: NOT DETECTED
CHLAMYDOPHILA PNEUMONIAE-RVPPCR: NOT DETECTED
Coronavirus 229E: NOT DETECTED
Coronavirus HKU1: NOT DETECTED
Coronavirus NL63: NOT DETECTED
Coronavirus OC43: NOT DETECTED
INFLUENZA A-RVPPCR: NOT DETECTED
Influenza B: NOT DETECTED
MYCOPLASMA PNEUMONIAE-RVPPCR: NOT DETECTED
Metapneumovirus: NOT DETECTED
PARAINFLUENZA VIRUS 3-RVPPCR: NOT DETECTED
PARAINFLUENZA VIRUS 4-RVPPCR: NOT DETECTED
Parainfluenza Virus 1: NOT DETECTED
Parainfluenza Virus 2: NOT DETECTED
RESPIRATORY SYNCYTIAL VIRUS-RVPPCR: NOT DETECTED
RHINOVIRUS / ENTEROVIRUS - RVPPCR: NOT DETECTED

## 2017-05-18 LAB — GLUCOSE, CAPILLARY
GLUCOSE-CAPILLARY: 182 mg/dL — AB (ref 65–99)
GLUCOSE-CAPILLARY: 115 mg/dL — AB (ref 65–99)
GLUCOSE-CAPILLARY: 173 mg/dL — AB (ref 65–99)
Glucose-Capillary: 176 mg/dL — ABNORMAL HIGH (ref 65–99)
Glucose-Capillary: 135 mg/dL — ABNORMAL HIGH (ref 65–99)

## 2017-05-18 LAB — POCT I-STAT 3, ART BLOOD GAS (G3+)
Acid-base deficit: 2 mmol/L (ref 0.0–2.0)
BICARBONATE: 23.5 mmol/L (ref 20.0–28.0)
O2 SAT: 98 %
PCO2 ART: 44.1 mmHg (ref 32.0–48.0)
PO2 ART: 120 mmHg — AB (ref 83.0–108.0)
Patient temperature: 38.1
TCO2: 25 mmol/L (ref 22–32)
pH, Arterial: 7.34 — ABNORMAL LOW (ref 7.350–7.450)

## 2017-05-18 LAB — LEGIONELLA PNEUMOPHILA SEROGP 1 UR AG: L. PNEUMOPHILA SEROGP 1 UR AG: NEGATIVE

## 2017-05-18 LAB — URINE CULTURE: CULTURE: NO GROWTH

## 2017-05-18 LAB — TROPONIN I: TROPONIN I: 0.06 ng/mL — AB (ref <0.03)

## 2017-05-18 LAB — PROCALCITONIN: Procalcitonin: 3.53 ng/mL

## 2017-05-18 LAB — MAGNESIUM: MAGNESIUM: 1 mg/dL — AB (ref 1.7–2.4)

## 2017-05-18 LAB — PHOSPHORUS: PHOSPHORUS: 1.4 mg/dL — AB (ref 2.5–4.6)

## 2017-05-18 LAB — LACTIC ACID, PLASMA: Lactic Acid, Venous: 5.4 mmol/L (ref 0.5–1.9)

## 2017-05-18 LAB — VALPROIC ACID LEVEL: VALPROIC ACID LVL: 62 ug/mL (ref 50.0–100.0)

## 2017-05-18 MED ORDER — MAGNESIUM SULFATE 4 GM/100ML IV SOLN
4.0000 g | Freq: Once | INTRAVENOUS | Status: AC
  Administered 2017-05-18: 4 g via INTRAVENOUS
  Filled 2017-05-18: qty 100

## 2017-05-18 MED ORDER — SODIUM CHLORIDE 0.9 % IV BOLUS (SEPSIS)
500.0000 mL | Freq: Once | INTRAVENOUS | Status: AC
  Administered 2017-05-18: 500 mL via INTRAVENOUS

## 2017-05-18 MED ORDER — VALPROATE SODIUM 500 MG/5ML IV SOLN
270.0000 mg | Freq: Three times a day (TID) | INTRAVENOUS | Status: DC
  Administered 2017-05-18 – 2017-05-19 (×2): 270 mg via INTRAVENOUS
  Filled 2017-05-18 (×5): qty 2.7

## 2017-05-18 MED ORDER — VALPROATE SODIUM 500 MG/5ML IV SOLN
270.0000 mg | Freq: Three times a day (TID) | INTRAVENOUS | Status: DC
  Filled 2017-05-18: qty 2.7

## 2017-05-18 MED ORDER — METOPROLOL TARTRATE 5 MG/5ML IV SOLN
2.5000 mg | INTRAVENOUS | Status: DC | PRN
  Administered 2017-05-18: 5 mg via INTRAVENOUS
  Filled 2017-05-18: qty 5

## 2017-05-18 MED ORDER — VALPROATE SODIUM 500 MG/5ML IV SOLN
1000.0000 mg | Freq: Once | INTRAVENOUS | Status: AC
  Administered 2017-05-18: 1000 mg via INTRAVENOUS
  Filled 2017-05-18: qty 10

## 2017-05-18 MED ORDER — SODIUM CHLORIDE 0.9 % IV SOLN
0.0000 ug/min | INTRAVENOUS | Status: DC
  Administered 2017-05-18: 20 ug/min via INTRAVENOUS
  Administered 2017-05-19: 10 ug/min via INTRAVENOUS
  Filled 2017-05-18 (×2): qty 10

## 2017-05-18 MED ORDER — ACETAMINOPHEN 325 MG PO TABS: 650.0000 mg | ORAL_TABLET | Freq: Four times a day (QID) | ORAL | Status: DC | PRN

## 2017-05-18 MED ORDER — SODIUM GLYCEROPHOSPHATE 1 MMOLE/ML IV SOLN
30.0000 mmol | Freq: Once | INTRAVENOUS | Status: AC
  Administered 2017-05-18: 30 mmol via INTRAVENOUS
  Filled 2017-05-18: qty 30

## 2017-05-18 MED ORDER — MIDAZOLAM HCL 2 MG/2ML IJ SOLN
2.0000 mg | INTRAMUSCULAR | Status: DC | PRN
Start: 2017-05-18 — End: 2017-05-19
  Administered 2017-05-18 – 2017-05-19 (×2): 1 mg via INTRAVENOUS
  Filled 2017-05-18 (×2): qty 2

## 2017-05-18 NOTE — Procedures (Signed)
ELECTROENCEPHALOGRAM REPORT  Date of Study: 05/15/2017 (received on 05/18/17)  Patient's Name: [name] MRN: [id number] Date of Birth: 1941/03/31  Referring Provider: Kathi Ludwig, MD  Clinical History: [name] is a 77 y.o.  African American female presented to the ER after experiencing a respiratory arrest in the field. Apparently has been struggling with "COPD exacerbation" during the past week or so and has not been improving on outpatient therapy. EMS had to perform at least 15 minutes of CPR to achieve ROSC with an unknown additional lead time, waiting for EMS to arrive. Physical exam is suggestive of neurologic devastation. Loud wheezing compatible with the notion that the patient has been struggling with COPD exacerbation. Unclear on history of COPD, but CXR does show hyperinflation and flattening of the diaphragms to suggest COPD. Will continue full supportive care for now, allowing time for any confounding medications to metabolize away. Steroids and aersols, empiric antibiotics. Hemodynamic support.   Medications: Scheduled Meds: . albuterol  2.5 mg Nebulization Q3H  . budesonide (PULMICORT) nebulizer solution  0.5 mg Nebulization BID  . chlorhexidine gluconate (MEDLINE KIT)  15 mL Mouth Rinse BID  . heparin  5,000 Units Subcutaneous Q8H  . insulin aspart  0-15 Units Subcutaneous Q4H  . ipratropium  0.5 mg Nebulization Q6H  . mouth rinse  15 mL Mouth Rinse QID  . methylPREDNISolone (SOLU-MEDROL) injection  60 mg Intravenous Q6H  . pantoprazole (PROTONIX) IV  40 mg Intravenous Daily   Continuous Infusions: . ceFEPime (MAXIPIME) IV Stopped (05/18/17 0908)  . doxycycline (VIBRAMYCIN) IV Stopped (05/18/17 0657)  . lactated ringers 75 mL/hr at 05/18/17 0600  . levETIRAcetam 1,000 mg (05/18/17 1215)  . magnesium sulfate 1 - 4 g bolus IVPB 4 g (05/18/17 1020)  . propofol (DIPRIVAN) infusion 20 mcg/kg/min (05/18/17 1204)  . sodium glycerophosphate 0.9% NaCl IVPB     . valproate sodium    . vancomycin     PRN Meds:.acetaminophen, docusate, fentaNYL, metoprolol tartrate, midazolam            Technical Summary: This is a standard 16 channel EEG recording performed according to the international 10-20 electrode system.  AP bipolar, transverse bipolar, and referential montages were obtained, and digitally reformatted as necessary.  Duration of tracing: 24:29  Description: Pt is intubated, comatose, sedated (Propofol), and on Keppra/ Depakote for myoclonic activity.  Background consists of generalized suppression with superimposed muscle/fast activity and no identifiable posterior rhythm.  There are intermittent bursts of generalized high amplitude spike wave discharges occurring every 1-4 seconds.  Many (but not all) of these bursts appeared to correlate with generalized myoclonic jerks as seen on video.  These bursts are interspersed by periods of relative voltage suppression. Attempts were made to manually open the patient's eyes, and determine if she would blink to threat, later in the tracing, but no appreciable change in background tracing were noted.  Neither HV or photic stimulation were performed.  EKG was monitored and noted to be ST at 126 bpm.   Impression: This is an abnormal EEG due to a burst-suppression pattern seen throughout the tracing with bursts that often correlated with generalized myoclonic jerks as seen on video.  A burst suppression pattern can be seen in a variety of circumstances, including anesthesia, drug intoxication, hypothermia, as well as cerebral anoxia.  This pattern, along with observed cortical myoclonic jerks, would be consistent with the patients clinical presentation of cardiorespiratory arrest and could portend a poor outcome.  Clinical/neurological, and radiographic correlation advised  in regards to anticonvulsant therapy.  A repeat EEG once off Propofol might provide additional prognostic information.   Carvel Getting, M.D. Neurology Cell [phone]

## 2017-05-18 NOTE — Progress Notes (Signed)
Resident MD notified of HR still elevated 130s-140s refractory to metoprolol;  MD to place new orders.

## 2017-05-18 NOTE — Progress Notes (Signed)
Stat EEG completed; results pending; Dr Aroor notifed.

## 2017-05-18 NOTE — Progress Notes (Signed)
Dr. Vaughan Browner informed pts core temp is 96.2 and gave verbal order to place bear hugger onto patient.

## 2017-05-18 NOTE — Progress Notes (Signed)
Residents notified of increase in HR, sustaining 130s. MD to place orders.

## 2017-05-18 NOTE — Progress Notes (Addendum)
2 mg versed given for intermittent jerking movements per verbal order from Dr. Gilford Raid at bedside. BP dropped to 82/53 after versed given, HR increased to 130s, verbal order for 250 ml bolus of LR given per Dr. Gilford Raid. Residents notified.

## 2017-05-18 NOTE — Progress Notes (Signed)
Initial Nutrition Assessment  DOCUMENTATION CODES:  Non-severe (moderate) malnutrition in context of acute illness/injury  INTERVENTION:  If clinically appropriate and in line with goals of care, recommend enteral nutrition  Vital High Protein at goal rate of 45 ml/h (1080 ml per day) to provide 1080 kcals (+304 from propofol), 95 gm protein, 903 ml free water daily.  NUTRITION DIAGNOSIS:  Inadequate oral intake related to inability to eat as evidenced by NPO status.  GOAL:  Patient will meet greater than or equal to 90% of their needs  MONITOR:  Diet advancement, Vent status, Labs, Weight trends, I & O's  REASON FOR ASSESSMENT:  Ventilator    ASSESSMENT:  77 y/o female PMHx bronchiectasis, mucous plugging, reactive airway disease, Anxiety, HTN. Had been unwell for ~10 days w/ cough/weakness. Treated for PNA w/ ABx, but stopped taking due to diarrhea. Suffered respiratory arrest at home requiring 15 min CPR before ROSC. Pt felt to have severe anoxic brain injury.   Pt intubated, sedated, non responsive. Multiple family members at bedside.   Patient had been sick for approximately 10 days PTA. Family reports she had an appetite, but when she ate she felt sick/nauseated. She also was having diarrhea, which was believed to be related to the PO abx she took.  At baseline, the patient is said to follow a "healthier" diet ie no fast food/convenience items. Patient took Vit C and Vit D.   UBW used to be reportedly 140 lbs, but over the course of the past year, her weight decreased and her new normal weight is ~125 lbs. Chart history confirms that the patients weight bounced between 125-135 for the past year.   Patient is currently intubated on ventilator support MV: 6.1 L/min Temp (24hrs), Avg:98.4 F (36.9 C), Min:94.1 F (34.5 C), Max:100.6 F (38.1 C) Propofol: 11.5 ml/hr -304 kcals/day  Physical Exam: Mild-Moderate temporal wasting. Mild clavicular muscle wasting. Mild thoracic  fat loss.  Feel that patient meets atleast moderate malnutrition in acute illness w/ Mild to moderate muscle/fat loss and what sounds to be an intake that met < 75% of needs for > 7 days.   Labs: bgs: 135-250, phos 1.4, mag 1.0, LA: 5.4, WBC:20.0 Meds: Insulin, Propofol, methylprednisolone, ppi, iv abx, ivf   Recent Labs  Lab 05/12/2017 1425 05/05/2017 1439 05/16/2017 1531 05/18/17 0611 05/18/17 0825  NA 138 139  --   --  138  K 3.3* 3.2*  --   --  4.1  CL 99* 97*  --   --  101  CO2 20*  --   --   --  26  BUN <5* 4*  --   --  5*  CREATININE 1.15* 0.80  --   --  1.00  CALCIUM 8.9  --   --   --  8.6*  MG  --   --  2.1 1.0*  --   PHOS  --   --  6.9*  --  1.4*  GLUCOSE 244* 242*  --   --  208*   NUTRITION - FOCUSED PHYSICAL EXAM:   Most Recent Value  Orbital Region  Mild depletion  Upper Arm Region  No depletion  Thoracic and Lumbar Region  Mild depletion  Buccal Region  No depletion  Temple Region  Moderate depletion  Clavicle Bone Region  Mild depletion  Clavicle and Acromion Bone Region  Mild depletion  Scapular Bone Region  No depletion  Dorsal Hand  No depletion  Patellar Region  No depletion  Anterior Thigh Region  No depletion  Posterior Calf Region  No depletion     Diet Order:  Diet NPO time specified  EDUCATION NEEDS:  No education needs have been identified at this time  Skin:  Skin Assessment: Reviewed RN Assessment  Last BM:  Unknown  Height:  Ht Readings from Last 1 Encounters:  05/21/2017 5' 5"  (1.651 m)   Weight:  Wt Readings from Last 1 Encounters:  05/18/17 119 lb 4.3 oz (54.1 kg)   Wt Readings from Last 10 Encounters:  05/18/17 119 lb 4.3 oz (54.1 kg)  04/25/17 124 lb 3.2 oz (56.3 kg)  03/26/17 131 lb 12.8 oz (59.8 kg)  03/07/17 129 lb 6.4 oz (58.7 kg)  02/25/17 125 lb 3.2 oz (56.8 kg)  02/25/17 125 lb 3.2 oz (56.8 kg)  02/12/17 132 lb (59.9 kg)  12/27/16 128 lb (58.1 kg)  10/09/16 128 lb (58.1 kg)  09/21/16 125 lb (56.7 kg)   Ideal Body  Weight:  56.82 kg  BMI:  Body mass index is 19.85 kg/m.  Estimated Nutritional Needs:  Kcal:  1335 Kcals (PSU 2003 B) Protein:  81-97g (1.5-1.7 g/kg bw) Fluid:  >1.4 L fluid (25 ml/kg bw)  Burtis Junes RD, LDN, CNSC Clinical Nutrition Pager: [phone] 05/18/2017 2:35 PM

## 2017-05-18 NOTE — Progress Notes (Addendum)
PULMONARY / CRITICAL CARE MEDICINE   Name: [name] MRN: [id number] DOB: [birth year]    ADMISSION DATE:  05/06/2017 CONSULTATION DATE: 1/18  REFERRING MD: Ellender Hose  CHIEF COMPLAINT: Cardiac arrest and acute respiratory failure HISTORY OF PRESENT ILLNESS:   This is 77 year old female patient followed by Dr. Annamaria Boots in our clinic she has a significant history of bronchiectasis, mucous plugging, and reactive airway disease.  He was recently hospitalized back in November 2018 for bronchiectasis cavitary pneumonia she was treated and improved.  She is since been treated once around Christmas again for acute exacerbation of underlying lung disease. On 1/9 she called our office reporting once again increasing chest tightness, cough, weakness, and sputum that was yellow in color.  The symptoms and initiated approximately a week to 10 days prior.  They have been progressive in nature she was offered Biaxin and encouraged to use her chest vest at home.  Over the following days her symptoms continued to worsen.  She had trouble taking the antibiotic citing complications such as nausea, tongue swelling, and dry mouth.  Ultimately she discontinued taking the antibiotic.  Shortness of breath continued to worsen.  On 1/18 she had collapsed in front of her husband.  She was pulseless and he started CPR.  She received CPR for an estimated 15 minutes before review of spontaneous circulation she did require 2 doses of epinephrine during ACLS.  She was intubated initially with a King airway, this was transitioned to oral endotracheal tube on arrival to the ER.  Critical care was asked to admit.  On arrival the patient's GCS is 3 however she had received rocuronium and etomidate to facilitate intubation approximately 60 minutes prior  PAST MEDICAL HISTORY :  She  has a past medical history of Allergic rhinitis, Anxiety, Asthma, HTN (hypertension), Pneumonia, and Shortness of breath dyspnea.  PAST SURGICAL HISTORY: She   has a past surgical history that includes Tubal ligation.  Allergies  Allergen Reactions  . Aspirin Other (See Comments)    Other reaction(s): Other (See Comments) STOMACH ULCERS. States stomach bubbles, becomes gaseous and irritated  . Clindamycin Other (See Comments)    REACTION: Neck, tongue swelling, SOB \\T \ rash  . Fruit & Vegetable Daily [Nutritional Supplements] Other (See Comments)    Tongue swelling, vomiting  . Levofloxacin Shortness Of Breath  . Penicillin G Anaphylaxis  . Penicillins Swelling  . Shellfish Allergy Anaphylaxis  . Sulfa Antibiotics Anaphylaxis  . Sulfonamide Derivatives Swelling    Throat swelling  . Montelukast Sodium Other (See Comments)    REACTION: hallucination  . Bee Venom Other (See Comments)    Unknown  . Latex Itching and Swelling  . Azithromycin Other (See Comments)    "makes me gag"  . Fluticasone-Salmeterol Other (See Comments)    REACTION: hoarseness  . Ventolin [Albuterol] Other (See Comments)    cough    No current facility-administered medications on file prior to encounter.    Current Outpatient Medications on File Prior to Encounter  Medication Sig  . acetaminophen (TYLENOL) 325 MG tablet Take 325 mg by mouth every 6 (six) hours as needed for moderate pain or headache.   . albuterol (PROAIR HFA) 108 (90 Base) MCG/ACT inhaler Inhale 2 puffs into the lungs every 4 (four) hours as needed for wheezing or shortness of breath. For shortness of breath  . albuterol (PROVENTIL) (2.5 MG/3ML) 0.083% nebulizer solution Take 3 mLs (2.5 mg total) by nebulization every 4 (four) hours as needed for wheezing or shortness  of breath.  . diltiazem (CARDIZEM) 120 MG tablet Take 1 tablet (120 mg total) by mouth 2 (two) times daily.  . famotidine (PEPCID) 20 MG tablet 1 tablet once daily before meal  . SYMBICORT 160-4.5 MCG/ACT inhaler inhale 2 puffs by mouth twice a day  . alum & mag hydroxide-simeth (MAALOX PLUS) 400-400-40 MG/5ML suspension Take 10  mLs by mouth every 6 (six) hours as needed for indigestion.  . Ascorbic Acid (VITAMIN C PO) Take 1 tablet by mouth daily.   . B Complex Vitamins (VITAMIN B COMPLEX PO) Take 1 tablet by mouth daily as needed (takes when she remembers to take it).   . Guaifenesin 1200 MG TB12 Take 1 tablet (1,200 mg total) by mouth 2 (two) times daily.  Marland Kitchen loratadine (CLARITIN) 10 MG tablet Take 10 mg by mouth daily as needed for allergies.   . Multiple Vitamin (MULTIVITAMIN) tablet Take 1 tablet by mouth daily.    Marland Kitchen Respiratory Therapy Supplies (FLUTTER) DEVI Blow through 4 times per set and repeat 3 sets per day, to loosen lung secretions.    FAMILY HISTORY:  Her indicated that her mother is deceased. She indicated that her father is deceased. She indicated that two of her seven brothers are alive. She indicated that the status of her maternal aunt is unknown.   SOCIAL HISTORY: She  reports that  has never smoked. she has never used smokeless tobacco. She reports that she does not drink alcohol or use drugs.  REVIEW OF SYSTEMS:   Unable  SUBJECTIVE:  Started myoclonic activity overnight.  Seen by neurology with concern for severe anoxic injury Currently on propofol.  Number loaded with Keppra.  VITAL SIGNS: BP 121/75   Pulse (!) 118   Temp 98.6 F (37 C)   Resp (!) 36   Ht 5\' 5"  (1.651 m)   Wt 119 lb 4.3 oz (54.1 kg)   SpO2 100%   BMI 19.85 kg/m   HEMODYNAMICS:    VENTILATOR SETTINGS: Vent Mode: PRVC FiO2 (%):  [40 %-100 %] 40 % Set Rate:  [14 bmp-20 bmp] 14 bmp Vt Set:  [40 mL-440 mL] 40 mL PEEP:  [5 cmH20] 5 cmH20 Plateau Pressure:  [19 cmH20-32 cmH20] 20 cmH20  INTAKE / OUTPUT: I/O last 3 completed shifts: In: 2907.9 [I.V.:1997.9; IV Piggyback:910] Out: 4580 [Urine:4230]  PHYSICAL EXAMINATION: Gen:      No acute distress, elederly HEENT:  EOMI, sclera anicteric Neck:     No masses; no thyromegaly Lungs:    Diffuse exp wheeze; normal respiratory effort CV:         Tachycardia,  regular Abd:      + bowel sounds; soft, non-tender; no palpable masses, no distension Ext:    No edema; adequate peripheral perfusion Skin:      Warm and dry; no rash Neuro: Sedated, unresponsive  LABS:  BMET Recent Labs  Lab 05/02/2017 1425 05/12/2017 1439 05/18/17 0825  NA 138 139 138  K 3.3* 3.2* 4.1  CL 99* 97* 101  CO2 20*  --  26  BUN <5* 4* 5*  CREATININE 1.15* 0.80 1.00  GLUCOSE 244* 242* 208*    Electrolytes Recent Labs  Lab 05/27/2017 1425 05/27/2017 1531 05/18/17 0611 05/18/17 0825  CALCIUM 8.9  --   --  8.6*  MG  --  2.1 1.0*  --   PHOS  --  6.9*  --  1.4*    CBC Recent Labs  Lab 05/16/2017 1425 05/20/2017 1439 05/18/17 9983  WBC 13.2*  --  20.0*  HGB 14.7 16.0* 13.4  HCT 46.4* 47.0* 41.5  PLT 267  --  228    Coag's No results for input(s): APTT, INR in the last 168 hours.  Sepsis Markers Recent Labs  Lab 05/28/2017 1440 05/26/2017 1531 05/18/17 0611  LATICACIDVEN 9.04*  --  5.4*  PROCALCITON  --  <0.10  --     ABG Recent Labs  Lab 05/09/2017 1512 05/05/2017 1759 05/18/17 0422  PHART 7.275* 7.255* 7.340*  PCO2ART 51.0* 53.5* 44.1  PO2ART 440.0* 481.0* 120.0*    Liver Enzymes Recent Labs  Lab 05/09/2017 1425  AST 74*  ALT 31  ALKPHOS 68  BILITOT 0.8  ALBUMIN 3.5    Cardiac Enzymes Recent Labs  Lab 05/18/2017 1531 05/24/2017 2148 05/18/17 0611  TROPONINI <0.03 0.03* 0.06*    Glucose Recent Labs  Lab 05/01/2017 1422 05/04/2017 1748 05/23/2017 2007 05/24/2017 2334 05/18/17 0403 05/18/17 0825  GLUCAP 215* 292* 339* 250* 176* 182*    Imaging Ct Head Wo Contrast  Result Date: 05/18/2017 CLINICAL DATA:  Status post CPR. EXAM: CT HEAD WITHOUT CONTRAST TECHNIQUE: Contiguous axial images were obtained from the base of the skull through the vertex without intravenous contrast. COMPARISON:  None. FINDINGS: Brain: There is no evidence for acute hemorrhage, hydrocephalus, mass lesion, or abnormal extra-axial fluid collection. No definite CT  evidence for acute infarction. Preservation of gray-white differentiation without evidence for global anoxia. Patchy low attenuation in the deep hemispheric and periventricular white matter is nonspecific, but likely reflects chronic microvascular ischemic demyelination. Vascular: No hyperdense vessel or unexpected calcification. Skull: No evidence for fracture. No worrisome lytic or sclerotic lesion. Sinuses/Orbits: Chronic mucosal disease is identified in the ethmoid air cells and right sphenoid sinus. Frontal sinuses and mastoid air cells are clear. No evidence for middle ear fluid. Visualized portions of the globes and intraorbital fat are unremarkable. Other: None. IMPRESSION: 1. No acute intracranial abnormality. Specifically, no acute hemorrhage. Preservation of gray-white differentiation. 2. Chronic small vessel white matter ischemic disease. Electronically Signed   By: Misty Stanley M.D.   On: 05/11/2017 16:48   Portable Chest Xray  Result Date: 05/18/2017 CLINICAL DATA:  77 year old female with history of lung mass. Evaluate for enteric tube positioning. EXAM: PORTABLE CHEST 1 VIEW Portable abdomen one view COMPARISON:  Chest radiograph dated 05/29/2017 and PET CT dated 11/23/2010 FINDINGS: Endotracheal tube approximately 3 cm above the carina. The enteric tube extends into the left hemiabdomen with side-port in the proximal body and tip in the mid stomach. Bibasilar and infrahilar densities likely atelectatic changes. Infiltrate is not excluded. Clinical correlation is recommended. There is no pleural effusion or pneumothorax. The cardiac silhouette is within normal limits. There is paucity of small bowel air. No bowel dilatation or evidence of obstruction. No free air identified. There is degenerative changes of the spine and hips. No acute osseous pathology. IMPRESSION: 1. Endotracheal tube above the carina and enteric tube in the stomach. 2. Bibasilar and infrahilar atelectasis/infiltrate. 3. No  bowel dilatation. Electronically Signed   By: Anner Crete M.D.   On: 05/18/2017 05:57   Dg Chest Portable 1 View  Result Date: 05/01/2017 CLINICAL DATA:  Status post intubation. EXAM: PORTABLE CHEST 1 VIEW COMPARISON:  Radiographs of April 25, 2017. FINDINGS: The heart size and mediastinal contours are within normal limits. Both lungs are clear. Endotracheal tube is seen projected over tracheal air shadow with distal tip approximately 4 cm above the carina. Atherosclerosis of thoracic aorta is  noted. No pneumothorax or pleural effusion is noted. The visualized skeletal structures are unremarkable. IMPRESSION: Aortic atherosclerosis. Endotracheal tube in grossly good position. No acute cardiopulmonary abnormality seen. Electronically Signed   By: Marijo Conception, M.D.   On: 05/16/2017 14:49   Dg Abd Portable 1v  Result Date: 05/18/2017 CLINICAL DATA:  77 year old female with history of lung mass. Evaluate for enteric tube positioning. EXAM: PORTABLE CHEST 1 VIEW Portable abdomen one view COMPARISON:  Chest radiograph dated 05/01/2017 and PET CT dated 11/23/2010 FINDINGS: Endotracheal tube approximately 3 cm above the carina. The enteric tube extends into the left hemiabdomen with side-port in the proximal body and tip in the mid stomach. Bibasilar and infrahilar densities likely atelectatic changes. Infiltrate is not excluded. Clinical correlation is recommended. There is no pleural effusion or pneumothorax. The cardiac silhouette is within normal limits. There is paucity of small bowel air. No bowel dilatation or evidence of obstruction. No free air identified. There is degenerative changes of the spine and hips. No acute osseous pathology. IMPRESSION: 1. Endotracheal tube above the carina and enteric tube in the stomach. 2. Bibasilar and infrahilar atelectasis/infiltrate. 3. No bowel dilatation. Electronically Signed   By: Anner Crete M.D.   On: 05/18/2017 05:57   STUDIES:  CT head 1/18 >  acute intracranial abnormality, chronic small vessel disease EEG 1/18> anoxic myoclonus  CULTURES: Blood cultures 1/18 Urine culture 1/18 Respiratory viral panel 1/18 Urine strep 1/18 Urine Legionella panel 1/18 Sputum culture 1/18  ANTIBIOTICS: Vancomycin 1/18 Cefepime 1/18 Doxy 1/18>>>  SIGNIFICANT EVENTS:   LINES/TUBES: Oral endotracheal doing 1/18  DISCUSSION: This is a 77 year old female with a history of chronic respiratory failure in the setting of bronchiectasis and resultant obstructive lung disease.  Now admitted status post cardiopulmonary arrest in the setting of what appeared to be progressive respiratory failure from underlying respiratory infection.  She has significant bronchospasm, with marked air trapping requiring adjustment of minute ventilation down to a frequency of 14.  She remains unresponsive with a GCS of 3.  The primary concern here is for an anoxic injury with.  They estimated time to [place] was 15 minutes.  We will admit her to the intensive care, continue mechanical ventilation, provide steroids, bronchodilators, empiric antibiotics, and supportive care.  ASSESSMENT / PLAN:  Acute on chronic hypercarbic and hypoxic respiratory failure in the setting of acute exacerbation chronic obstructive lung disease Severe bronchospasm with air trapping History of bronchiectasis -Endotracheal tube in satisfactory position no clear infiltrate on film Plan Continue full ventilator support. No autopeep noted.  Follow ABG, CXR Scheduled bronchodilators, Solu-Medrol Follow respiratory virus panel, cultures Continue antibiotics with Vanco, cefepime and doxy.  Circulatory shock: Status post cardiac arrest Elevated troponin, sinus tachycardia Plan Off pressors, Lopressor as needed Start IV fluids Follow troponin, echocardiogram.  Acute encephalopathy.  Highly concerning for anoxic brain injury Downtime was estimated at 15 minutes prior to review of spontaneous  circulation Plan Continue supportive care On propofol, Versed as needed, Keppra for myoclonus Appreciate neurology recommendation.  Continue to monitor neuro status  Hypokalemia, hypomag Plan Replete lytes  Hyperglycemia Plan SSI  FAMILY  - Updates: Son, daughter in law and grandson updated at bedside. Conveyed our concern that she likely has severe anoxi injury. Discussed with husband. She is now DNR. - Inter-disciplinary family meet or Palliative Care meeting due by: 1/25  The patient is critically ill with multiple organ system failure and requires high complexity decision making for assessment and support, frequent evaluation and titration  of therapies, advanced monitoring, review of radiographic studies and interpretation of complex data.   Critical Care Time devoted to patient care services, exclusive of separately billable procedures, described in this note is 32 minutes.   Marshell Garfinkel MD [facility] Pager [phone] If no answer or after 3pm call: [phone] 05/18/2017, 9:26 AM

## 2017-05-18 NOTE — Progress Notes (Signed)
Resident MDs notified of HR still elevated sustaining 140s, refractory to metoprolol. MD to place new orders.

## 2017-05-18 NOTE — Progress Notes (Signed)
MD notified of temp 100.4, awaiting orders

## 2017-05-18 NOTE — Progress Notes (Addendum)
Subjective: Pt seen in room with family and husband at bedside. Unable to obtain subjective information from patient due to mental status. Discussed plan of care with patient's husband. Pt is DNR.  Exam: Vitals:   05/18/17 1315 05/18/17 1345  BP: (!) 111/55 (!) 88/47  Pulse: 99 92  Resp: 19 14  Temp: 97.7 F (36.5 C) 97.9 F (36.6 C)  SpO2: 100% 100%    Physical Exam   Constitutional: Appears well-developed and well-nourished.  Eyes: No scleral injection HEENT: Head atraumatic Cardiovascular: Normal rate and regular rhythm.  Respiratory: Intubated with mechanical ventilation, non-labored breathing GI: Soft.  No distension. There is no tenderness.  Skin:Dry and intact  Neuro:  Mental Status: (Sedated on propofol and Versed) Patient does not respond to verbal stimuli.  Does not respond to deep sternal rub.  Does not follow commands.  No verbalizations noted.  Cranial Nerves: II: pupils right 75m, left 382m sluggish  III,IV,VI: doll's response absent bilaterally V,VII: corneal reflex: absent VIII: patient does not respond to verbal stimuli IX,X: gag reflex absent   Motor: Extremities flaccid throughout.  No spontaneous movement noted.  No purposeful movements noted. No myoclonic jerks of both upper extremities noted (Sedated).  Sensory: Does not respond to noxious stimuli in any extremity. Cerebellar: Unable to perform Gait: Unable to assess    Medications:  Current Facility-Administered Medications:  .  acetaminophen (TYLENOL) tablet 650 mg, 650 mg, Per Tube, Q6H PRN, HaKathi LudwigMD .  albuterol (PROVENTIL) (2.5 MG/3ML) 0.083% nebulizer solution 2.5 mg, 2.5 mg, Nebulization, Q3H, BaErick ColaceNP, 2.5 mg at 05/18/17 1209 .  budesonide (PULMICORT) nebulizer solution 0.5 mg, 0.5 mg, Nebulization, BID, BaErick ColaceNP, 0.5 mg at 05/18/17 0852 .  ceFEPIme (MAXIPIME) 1 g in dextrose 5 % 50 mL IVPB, 1 g, Intravenous, Q8H, Yopp, Amber C, RPH, Stopped at 05/18/17  0908 .  chlorhexidine gluconate (MEDLINE KIT) (PERIDEX) 0.12 % solution 15 mL, 15 mL, Mouth Rinse, BID, BaSalvadore Dom, NP, 15 mL at 05/18/17 0839 .  docusate (COLACE) 50 MG/5ML liquid 100 mg, 100 mg, Per Tube, BID PRN, BaErick ColaceNP .  doxycycline (VIBRAMYCIN) 100 mg in dextrose 5 % 250 mL IVPB, 100 mg, Intravenous, Q12H, BaErick ColaceNP, Stopped at 05/18/17 [phone]  fentaNYL (SUBLIMAZE) bolus via infusion 25 mcg, 25 mcg, Intravenous, Q1H PRN, IsDuffy BruceMD .  heparin injection 5,000 Units, 5,000 Units, Subcutaneous, Q8H, BaErick ColaceNP, 5,000 Units at 05/18/17 1307 .  insulin aspart (novoLOG) injection 0-15 Units, 0-15 Units, Subcutaneous, Q4H, BaErick ColaceNP, 2 Units at 05/18/17 1306 .  ipratropium (ATROVENT) nebulizer solution 0.5 mg, 0.5 mg, Nebulization, Q6H, BaErick ColaceNP, 0.5 mg at 05/18/17 0852 .  lactated ringers infusion, , Intravenous, Continuous, BaErick ColaceNP, Last Rate: 75 mL/hr at 05/18/17 0600 .  levETIRAcetam (KEPPRA) 1,000 mg in sodium chloride 0.9 % 100 mL IVPB, 1,000 mg, Intravenous, Q12H, Aroor, SuLanice SchwabMD, Stopped at 05/18/17 1242 .  MEDLINE mouth rinse, 15 mL, Mouth Rinse, QID, BaSalvadore Dom, NP, 15 mL at 05/18/17 1216 .  methylPREDNISolone sodium succinate (SOLU-MEDROL) 125 mg/2 mL injection 60 mg, 60 mg, Intravenous, Q6H, BaErick ColaceNP, 60 mg at 05/18/17 0843 .  metoprolol tartrate (LOPRESSOR) injection 2.5-5 mg, 2.5-5 mg, Intravenous, Q2H PRN, HaKathi LudwigMD, 5 mg at 05/18/17 0111 .  midazolam (VERSED) injection 2 mg, 2 mg, Intravenous, Q4H PRN, Mannam, Praveen, MD, 1 mg at 05/18/17 0901 .  pantoprazole (PROTONIX) injection 40 mg, 40 mg, Intravenous, Daily, Erick Colace, NP, 40 mg at 05/18/17 0943 .  propofol (DIPRIVAN) 1000 MG/100ML infusion, 5-80 mcg/kg/min, Intravenous, Titrated, Harbrecht, Lawrence, MD, Last Rate: 10 mL/hr at 05/18/17 1353, 30 mcg/kg/min at 05/18/17 1353 .  sodium glycerophosphate  (GLYCOPHOS) 30 mmol in sodium chloride 0.9 % 250 mL infusion, 30 mmol, Intravenous, Once, Mannam, Praveen, MD .  valproate (DEPACON) 270 mg in dextrose 5 % 50 mL IVPB, 270 mg, Intravenous, Q8H, Mannam, Praveen, MD .  vancomycin (VANCOCIN) IVPB 1000 mg/200 mL premix, 1,000 mg, Intravenous, Q24H, Yopp, Amber C, RPH   Pertinent Labs/Diagnostics: CT Head - non contrast IMPRESSION: 1. No acute intracranial abnormality. Specifically, no acute hemorrhage. Preservation of gray-white differentiation. 2. Chronic small vessel white matter ischemic disease.  EEG 05/28/2017 Description: Pt is intubated, comatose, sedated (Propofol), and on Keppra/ Depakote for myoclonic activity. Background consists of generalized suppression with superimposed muscle/fast activity and no identifiable posterior rhythm.  There are intermittent bursts of generalized high amplitude spike wave discharges occurring every 1-4 seconds.  Many (but not all) of these bursts appeared to correlate with generalized myoclonic jerks as seen on video.  These bursts are interspersed by periods of relative voltage suppression. Attempts were made to manually open the patient's eyes, and determine if she would blink to threat, later in the tracing, but no appreciable change in background tracing were noted. Neither HV or photic stimulation were performed. EKG was monitored and noted to be ST at 126 bpm.  Impression:  This is an abnormal EEG due toa burst-suppression patternseen throughout the tracing with bursts that often correlated with generalized myoclonic jerks as seen on video.A burst suppression pattern can be seen in a variety of circumstances, including anesthesia, drug intoxication, hypothermia, as well as cerebral anoxia.  This pattern, along with observed cortical myoclonic jerks, would be consistent with the patients clinical presentation of cardiorespiratory arrest and could portend a poor outcome. Clinical/neurological, and  radiographic correlation advised in regards to anticonvulsant therapy. A repeat EEG once off Propofol might provide additional prognostic information.  Assessment:: BETHSAIDA SIEGENTHALER is a 77 y.o. female with PMH of asthma, bronchiectasis collapsed on 1/18. Found to be pulseless by EMS, resuscitated with ROSC obtained in 15 min 2 doses of epinephrine. Patient intuabted and sedated. Patient noted to have frequent myoclonic jerks around 10 pm and neurology was consulted.  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  1. Anoxic Brain Injury s/p PEA arrest 3. Loaded with IV Keppra 1g on 05/25/2017 and continued at 1g BID, no myoclonic jerks noted during assessment today on sedation 4. Currently on Propofol  5. EEG reading from 1/18  finding consistent with cerebral anoxia, likely poor prognosis.  1. Patient's husband and family aware of anoxic brain injury and per husband, prior to hospitalization they had discussed code status: Husband states pt is a DNR and they request to remove life sustaining measures if the patient will not have any body or brain function thereafter.  Family wishes to wait for 48 hrs prior to making any further decisions.   Recommendations: 1. Continue Proprofol and wean it and other sedatives 24hrs prior to Neurology eval 2. MRI for prognostication 3. EEG after propofol and Versed have been discontinued for > 12 hours. 4. Continue Keppra and valproic acid at current dosages  Jacob Moores DNP Neuro-hospitalist Team [phone]  35 minutes spent in the Neurological evaluation and management of this critically ill patient. Time spent included family education.  Electronically signed: Dr. Kerney Elbe 05/18/2017, 1:54 PM

## 2017-05-18 NOTE — Progress Notes (Signed)
RN assessed patient intermittent jerking movements. Residents notified, awaiting orders.

## 2017-05-19 ENCOUNTER — Inpatient Hospital Stay (HOSPITAL_COMMUNITY): Payer: Medicare Other

## 2017-05-19 LAB — GLUCOSE, CAPILLARY
GLUCOSE-CAPILLARY: 101 mg/dL — AB (ref 65–99)
Glucose-Capillary: 117 mg/dL — ABNORMAL HIGH (ref 65–99)
Glucose-Capillary: 169 mg/dL — ABNORMAL HIGH (ref 65–99)

## 2017-05-19 LAB — CBC
HCT: 36.3 % (ref 36.0–46.0)
HEMOGLOBIN: 11.9 g/dL — AB (ref 12.0–15.0)
MCH: 29.2 pg (ref 26.0–34.0)
MCHC: 32.8 g/dL (ref 30.0–36.0)
MCV: 89.2 fL (ref 78.0–100.0)
Platelets: 265 10*3/uL (ref 150–400)
RBC: 4.07 MIL/uL (ref 3.87–5.11)
RDW: 14.3 % (ref 11.5–15.5)
WBC: 33.7 10*3/uL — ABNORMAL HIGH (ref 4.0–10.5)

## 2017-05-19 LAB — MAGNESIUM: MAGNESIUM: 2.6 mg/dL — AB (ref 1.7–2.4)

## 2017-05-19 LAB — BASIC METABOLIC PANEL
ANION GAP: 12 (ref 5–15)
BUN: 7 mg/dL (ref 6–20)
CO2: 22 mmol/L (ref 22–32)
Calcium: 8.3 mg/dL — ABNORMAL LOW (ref 8.9–10.3)
Chloride: 106 mmol/L (ref 101–111)
Creatinine, Ser: 0.91 mg/dL (ref 0.44–1.00)
GFR, EST NON AFRICAN AMERICAN: 60 mL/min — AB (ref >60)
Glucose, Bld: 129 mg/dL — ABNORMAL HIGH (ref 65–99)
Potassium: 2.8 mmol/L — ABNORMAL LOW (ref 3.5–5.1)
Sodium: 140 mmol/L (ref 135–145)

## 2017-05-19 LAB — PHOSPHORUS: PHOSPHORUS: 2.5 mg/dL (ref 2.5–4.6)

## 2017-05-19 LAB — CULTURE, RESPIRATORY: CULTURE: NORMAL

## 2017-05-19 LAB — PROCALCITONIN: Procalcitonin: 3.13 ng/mL

## 2017-05-19 MED ORDER — POTASSIUM CHLORIDE 20 MEQ/15ML (10%) PO SOLN
40.0000 meq | ORAL | Status: AC
  Administered 2017-05-19 (×2): 40 meq
  Filled 2017-05-19 (×2): qty 30

## 2017-05-19 MED ORDER — SODIUM CHLORIDE 0.9 % IV SOLN
500.0000 mg | Freq: Two times a day (BID) | INTRAVENOUS | Status: DC
Start: 2017-05-19 — End: 2017-05-19
  Filled 2017-05-19: qty 500

## 2017-05-19 MED ORDER — SODIUM CHLORIDE 0.9 % IV SOLN
10.0000 mg/h | INTRAVENOUS | Status: DC
  Administered 2017-05-19: 10 mg/h via INTRAVENOUS
  Filled 2017-05-19: qty 10

## 2017-05-19 MED ORDER — MORPHINE BOLUS VIA INFUSION
5.0000 mg | INTRAVENOUS | Status: DC | PRN
  Filled 2017-05-19: qty 20

## 2017-05-21 ENCOUNTER — Telehealth: Payer: Self-pay

## 2017-05-21 NOTE — Telephone Encounter (Signed)
On 05/21/17 I received a d/c from [facility] (original). The d/c is for burial. The patient is a patient of Doctor Mannam. The d/c will be taken to Pulmonary Unit @ [facility] for signature. On 05/23/17 I received a d/c back from Doctor Mannam. I got the d/c ready and called the funeral home to let them know the d/c is ready for pickup.

## 2017-05-22 LAB — CULTURE, BLOOD (ROUTINE X 2)
Culture: NO GROWTH
Culture: NO GROWTH
Special Requests: ADEQUATE
Special Requests: ADEQUATE

## 2017-05-31 NOTE — Progress Notes (Signed)
Pharmacy Antibiotic Note  [name] is a 77 y.o. female admitted on 05/18/2017 with sepsis. Pharmacy is dosing vancomycin and cefepime. WBC has trended up today. Intermittently hypothermic. PCT remains elevated at 3.13. CrCl remains stable at ~ 50 mL/min  Plan: Increase vancomycin to 500 mg IV Q 12 hours Continue cefepime 1 gm IV Q 8 hours  Continue doxycycline per MD  Monitor for clinical progression, renal function, and trough as needed.  Height: 5\' 5"  (165.1 cm) Weight: 129 lb 13.6 oz (58.9 kg) IBW/kg (Calculated) : 57  Temp (24hrs), Avg:98.3 F (36.8 C), Min:96.1 F (35.6 C), Max:99.5 F (37.5 C)  Recent Labs  Lab 05/16/2017 1425 05/23/2017 1439 05/18/2017 1440 05/18/17 0611 05/18/17 0825 06/18/2017 0257  WBC 13.2*  --   --  20.0*  --  33.7*  CREATININE 1.15* 0.80  --   --  1.00 0.91  LATICACIDVEN  --   --  9.04* 5.4*  --   --     Estimated Creatinine Clearance: 47.3 mL/min (by C-G formula based on SCr of 0.91 mg/dL).    Allergies  Allergen Reactions  . Aspirin Other (See Comments)    Other reaction(s): Other (See Comments) STOMACH ULCERS. States stomach bubbles, becomes gaseous and irritated  . Clindamycin Other (See Comments)    REACTION: Neck, tongue swelling, SOB \\T \ rash  . Fruit & Vegetable Daily [Nutritional Supplements] Other (See Comments)    Tongue swelling, vomiting  . Levofloxacin Shortness Of Breath  . Penicillin G Anaphylaxis  . Penicillins Swelling  . Shellfish Allergy Anaphylaxis  . Sulfa Antibiotics Anaphylaxis  . Sulfonamide Derivatives Swelling    Throat swelling  . Montelukast Sodium Other (See Comments)    REACTION: hallucination  . Bee Venom Other (See Comments)    Unknown  . Latex Itching and Swelling  . Azithromycin Other (See Comments)    "makes me gag"  . Fluticasone-Salmeterol Other (See Comments)    REACTION: hoarseness  . Ventolin [Albuterol] Other (See Comments)    cough    Antimicrobials this admission: Vancomycin 1/18>>   Cefepime 1/18 >>   Dose adjustments this admission: None  Microbiology results: 1/18 BCx: pending 1/18 Ucx: neg 1/18 TA: pending 1/18 MRSA PCR neg  1/18 resp panel neg   Thank you for allowing pharmacy to be a part of this patient's care.  Albertina Parr, PharmD., BCPS Clinical Pharmacist Pager [phone]

## 2017-05-31 NOTE — Progress Notes (Signed)
215 cc of Morphine drip wasted with Demetria RN

## 2017-05-31 NOTE — Progress Notes (Signed)
Pt was extubated to terminal wean. Pt in no distress and looks comfortable at this time.

## 2017-05-31 NOTE — Progress Notes (Signed)
[facility] ADULT ICU REPLACEMENT PROTOCOL FOR AM LAB REPLACEMENT ONLY  The patient does apply for the [facility] Adult ICU Electrolyte Replacment Protocol based on the criteria listed below:   1. Is GFR >/= 40 ml/min? Yes.    Patient's GFR today is >60 2. Is urine output >/= 0.5 ml/kg/hr for the last 6 hours? Yes.   Patient's UOP is 1.1 ml/kg/hr 3. Is BUN < 60 mg/dL? Yes.    Patient's BUN today is 7 4. Abnormal electrolyte(s): K 2.8 5. Ordered repletion with: protocol 6. If a panic level lab has been reported, has the CCM MD in charge been notified? No..   Physician:    Ronda Fairly A 2017-05-29 5:56 AM

## 2017-05-31 NOTE — Progress Notes (Signed)
The patients husband and her four sons had conference with Dr. Vaughan Browner today regarding withdrawal of care. Pt is no code blue/DNR. Pts family decided to withdraw further treatment and pt was placed on comfort care and morphine drip was initiated. About two and half hours later the patient's husband informed staff he and his family was ready to withdraw patient from the ventilator. Pt was extubated by the respiratory technician Kristen at 1420. Pt appeared to be in no distress and appeared comfortable with morphine drip at 10 mg/hr. No grimacing and no myoclonic jerking observed. Family at bedside and family reported to this RN they thought the patient appeared comfortable and expressed no further needs. Pt then went asystole on monitor. This RN and Luis Abed went into room to auscultate for heart and lung sounds. No heart or lung sounds heard by this RN and Rip Harbour, Therapist, sports. No rise and fall of chest noted. Time of death 53, family notified and was at bedside. Dr. Vaughan Browner notified. Rhythm strip printed.

## 2017-05-31 NOTE — Death Summary Note (Signed)
Pulmonary and Critical Care Death Summary  Name: [name] MRN: [id number] DOB: July 07, 1940 77 y.o.  Date of Admission: 05/28/2017  2:17 PM Date of Discharge: 2017/06/16 Attending Physician: Marshell Garfinkel, MD  Discharge Diagnosis: Principal Problem:   Anoxic encephalopathy ([name]) Active Problems:   Chronic respiratory failure with hypoxia ([name])   Acute respiratory failure ([facility])   Hypokalemia   Hyperglycemia   Malnutrition of moderate degree  Cause of death: acute respiratory failure, anoxic brain injury Time of death: 14:43  Disposition and follow-up:   [name] was discharged from [facility] in expired condition.    Hospital Course:  This is 77 year old female patient followed by Dr. Annamaria Boots in our clinic who has a significant history of bronchiectasis, mucous plugging, and reactive airway disease.  She was recently hospitalized back in November 2018 for bronchiectasis cavitary pneumonia she was treated and improved.  She is since been treated once around Christmas again for acute exacerbation of underlying lung disease. On 1/9 she called the office reporting once again increasing chest tightness, cough, weakness, and sputum that was yellow in color.  The symptoms and initiated approximately a week to 10 days prior.  They were progressive in nature she was offered Biaxin and encouraged to use her chest vest at home.  Over the following days her symptoms continued to worsen.  She had trouble taking the antibiotic citing complications such as nausea, tongue swelling, and dry mouth.  Ultimately she discontinued taking the antibiotic.  Shortness of breath continued to worsen.  On 1/18 she had collapsed in front of her husband.  She was pulseless and he started CPR.  She received CPR for an estimated 15 minutes before review of spontaneous circulation she did require 2 doses of epinephrine during ACLS.  She was intubated initially with a King airway, this was  transitioned to oral endotracheal tube on arrival to the ER. She had significant bronchospasm, with marked air trapping requiring adjustment of minute ventilation down to a frequency of 14.  She remains unresponsive with a GCS of 3.   The primary concern was to have anoxic brain injury and Neurology was consulted. She was not responding to verbal stimuli. She was loaded with IV keppra, and was kept on propofol and versed. EEG reading from 1/18 showed anoxic brain injury. MRI was recommended for prognostication as pt had some myoclonic jerking.    Patient is a DNR, and husband wished to proceed to comfort care measures for peaceful death and remove life sustaining measures and proceed with terminal extubation after the family meeting that happened on 06/16/22. The time of death was 14:43 and pt passed away peacefully.        Signed: Burgess Estelle, MD 2017/06/16, 5:07 PM

## 2017-05-31 NOTE — Progress Notes (Signed)
PULMONARY / CRITICAL CARE MEDICINE   Name: [name] MRN: [id number] DOB: [birth year]    ADMISSION DATE:  05/13/2017 CONSULTATION DATE: 1/18  REFERRING MD: Ellender Hose  CHIEF COMPLAINT: Cardiac arrest and acute respiratory failure HISTORY OF PRESENT ILLNESS:   This is 77 year old female patient followed by Dr. Annamaria Boots in our clinic she has a significant history of bronchiectasis, mucous plugging, and reactive airway disease.  He was recently hospitalized back in November 2018 for bronchiectasis cavitary pneumonia she was treated and improved.  She is since been treated once around Christmas again for acute exacerbation of underlying lung disease. On 1/9 she called our office reporting once again increasing chest tightness, cough, weakness, and sputum that was yellow in color.  The symptoms and initiated approximately a week to 10 days prior.  They have been progressive in nature she was offered Biaxin and encouraged to use her chest vest at home.  Over the following days her symptoms continued to worsen.  She had trouble taking the antibiotic citing complications such as nausea, tongue swelling, and dry mouth.  Ultimately she discontinued taking the antibiotic.  Shortness of breath continued to worsen.  On 1/18 she had collapsed in front of her husband.  She was pulseless and he started CPR.  She received CPR for an estimated 15 minutes before review of spontaneous circulation she did require 2 doses of epinephrine during ACLS.  She was intubated initially with a King airway, this was transitioned to oral endotracheal tube on arrival to the ER.  Critical care was asked to admit.  On arrival the patient's GCS is 3 however she had received rocuronium and etomidate to facilitate intubation approximately 60 minutes prior  PAST MEDICAL HISTORY :  She  has a past medical history of Allergic rhinitis, Anxiety, Asthma, HTN (hypertension), Pneumonia, and Shortness of breath dyspnea.  PAST SURGICAL HISTORY: She   has a past surgical history that includes Tubal ligation.  Allergies  Allergen Reactions  . Aspirin Other (See Comments)    Other reaction(s): Other (See Comments) STOMACH ULCERS. States stomach bubbles, becomes gaseous and irritated  . Clindamycin Other (See Comments)    REACTION: Neck, tongue swelling, SOB \\T \ rash  . Fruit & Vegetable Daily [Nutritional Supplements] Other (See Comments)    Tongue swelling, vomiting  . Levofloxacin Shortness Of Breath  . Penicillin G Anaphylaxis  . Penicillins Swelling  . Shellfish Allergy Anaphylaxis  . Sulfa Antibiotics Anaphylaxis  . Sulfonamide Derivatives Swelling    Throat swelling  . Montelukast Sodium Other (See Comments)    REACTION: hallucination  . Bee Venom Other (See Comments)    Unknown  . Latex Itching and Swelling  . Azithromycin Other (See Comments)    "makes me gag"  . Fluticasone-Salmeterol Other (See Comments)    REACTION: hoarseness  . Ventolin [Albuterol] Other (See Comments)    cough    No current facility-administered medications on file prior to encounter.    Current Outpatient Medications on File Prior to Encounter  Medication Sig  . acetaminophen (TYLENOL) 325 MG tablet Take 325 mg by mouth every 6 (six) hours as needed for moderate pain or headache.   . albuterol (PROAIR HFA) 108 (90 Base) MCG/ACT inhaler Inhale 2 puffs into the lungs every 4 (four) hours as needed for wheezing or shortness of breath. For shortness of breath  . albuterol (PROVENTIL) (2.5 MG/3ML) 0.083% nebulizer solution Take 3 mLs (2.5 mg total) by nebulization every 4 (four) hours as needed for wheezing or shortness  of breath.  Marland Kitchen azithromycin (ZITHROMAX) 500 MG tablet Take 500 mg by mouth 2 (two) times daily. For 7 [facility]  . diltiazem (CARDIZEM) 120 MG tablet Take 1 tablet (120 mg total) by mouth 2 (two) times daily.  . famotidine (PEPCID) 20 MG tablet 1 tablet once daily before meal  . Guaifenesin 1200 MG TB12 Take 1 tablet (1,200 mg  total) by mouth 2 (two) times daily.  . SYMBICORT 160-4.5 MCG/ACT inhaler inhale 2 puffs by mouth twice a day  . alum & mag hydroxide-simeth (MAALOX PLUS) 400-400-40 MG/5ML suspension Take 10 mLs by mouth every 6 (six) hours as needed for indigestion.  . Ascorbic Acid (VITAMIN C PO) Take 1 tablet by mouth daily.   . B Complex Vitamins (VITAMIN B COMPLEX PO) Take 1 tablet by mouth daily as needed (takes when she remembers to take it).   . loratadine (CLARITIN) 10 MG tablet Take 10 mg by mouth daily as needed for allergies.   . Multiple Vitamin (MULTIVITAMIN) tablet Take 1 tablet by mouth daily.    Marland Kitchen Respiratory Therapy Supplies (FLUTTER) DEVI Blow through 4 times per set and repeat 3 sets per day, to loosen lung secretions.    FAMILY HISTORY:  Her indicated that her mother is deceased. She indicated that her father is deceased. She indicated that two of her seven brothers are alive. She indicated that the status of her maternal aunt is unknown.   SOCIAL HISTORY: She  reports that  has never smoked. she has never used smokeless tobacco. She reports that she does not drink alcohol or use drugs.  REVIEW OF SYSTEMS:   Unable  SUBJECTIVE:   Pt's family at bedside- says there has been no change in her. Currently on propofol and versed PRN, with keppra q8 hours and depakote       VITAL SIGNS: BP 135/72   Pulse (!) 103   Temp 98.8 F (37.1 C)   Resp (!) 26   Ht 5\' 5"  (1.651 m)   Wt 129 lb 13.6 oz (58.9 kg)   SpO2 100%   BMI 21.61 kg/m   HEMODYNAMICS:    VENTILATOR SETTINGS: Vent Mode: PRVC FiO2 (%):  [40 %] 40 % Set Rate:  [14 bmp] 14 bmp Vt Set:  [40 mL-400 mL] 400 mL PEEP:  [5 cmH20] 5 cmH20 Plateau Pressure:  [18 cmH20-24 cmH20] 18 cmH20  INTAKE / OUTPUT: I/O last 3 completed shifts: In: 5880.3 [I.V.:3257.6; IV Piggyback:2622.7] Out: 3305 [Urine:3305]  PHYSICAL EXAMINATION: Gen:      No acute distress, elderly woman intubated, and family at bedside. No myoclonic  jerks noted SHe does not respond to verbal stimuli, or sternal rubs, and no spontaneous movt of extremities.  HEENT:  sclera anicteric, eyes are halfway open, no eye reflex to threat                      Pupils are dilated and nonreactive to light  Lungs:    Diffuse exp wheeze; normal respiratory effort CV:         Tachycardia, regular, hypertensive to 260s/210 on my exam  Abd:      + bowel sounds; soft, non-tender; no palpable masses, no distension Ext:    No edema; adequate peripheral perfusion Skin:      Warm and dry; no rash Neuro: Sedated, unresponsive  LABS:  BMET Recent Labs  Lab 05/25/2017 1425 05/29/2017 1439 05/18/17 0825 Jun 13, 2017 0257  NA 138 139 138 140  K 3.3* 3.2* 4.1 2.8*  CL 99* 97* 101 106  CO2 20*  --  26 22  BUN <5* 4* 5* 7  CREATININE 1.15* 0.80 1.00 0.91  GLUCOSE 244* 242* 208* 129*    Electrolytes Recent Labs  Lab 05/21/2017 1425 05/05/2017 1531 05/18/17 0611 05/18/17 0825 June 13, 2017 0257  CALCIUM 8.9  --   --  8.6* 8.3*  MG  --  2.1 1.0*  --  2.6*  PHOS  --  6.9*  --  1.4* 2.5    CBC Recent Labs  Lab 04/30/2017 1425 05/28/2017 1439 05/18/17 0611 2017/06/13 0257  WBC 13.2*  --  20.0* 33.7*  HGB 14.7 16.0* 13.4 11.9*  HCT 46.4* 47.0* 41.5 36.3  PLT 267  --  228 265    Coag's No results for input(s): APTT, INR in the last 168 hours.  Sepsis Markers Recent Labs  Lab 05/12/2017 1440 05/25/2017 1531 05/18/17 0611 05/18/17 0825 2017/06/13 0257  LATICACIDVEN 9.04*  --  5.4*  --   --   PROCALCITON  --  <0.10  --  3.53 3.13    ABG Recent Labs  Lab 05/08/2017 1512 05/22/2017 1759 05/18/17 0422  PHART 7.275* 7.255* 7.340*  PCO2ART 51.0* 53.5* 44.1  PO2ART 440.0* 481.0* 120.0*    Liver Enzymes Recent Labs  Lab 05/15/2017 1425  AST 74*  ALT 31  ALKPHOS 68  BILITOT 0.8  ALBUMIN 3.5    Cardiac Enzymes Recent Labs  Lab 05/12/2017 1531 05/22/2017 2148 05/18/17 0611  TROPONINI <0.03 0.03* 0.06*    Glucose Recent Labs  Lab 05/18/17 0825  05/18/17 1227 05/18/17 1649 05/18/17 1959 Jun 13, 2017 0014 June 13, 2017 0347  GLUCAP 182* 135* 115* 173* 101* 117*    Imaging No results found. 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  Now admitted status post cardiopulmonary arrest in the setting of what appeared to be progressive respiratory failure from underlying respiratory infection.  She has significant bronchospasm, with marked air trapping requiring adjustment of minute ventilation down to a frequency of 14.  She remains unresponsive with a GCS of 3. Now with anoxic brain injury    ASSESSMENT / PLAN:  Acute on chronic hypercarbic and hypoxic respiratory failure in the setting of acute exacerbation chronic obstructive lung disease Severe bronchospasm with air trapping History of bronchiectasis  -Endotracheal tube in satisfactory position  Per repeat cxr today. Resp viral panel, and cultures have been all negative. Can think about de-escalating antibiotics.   Plan Continue full ventilator support. No autopeep noted.  Follow ABG, CXR Scheduled bronchodilators, Solu-Medrol Continue antibiotics with Vanco, cefepime and doxy.  Circulatory shock: Status post cardiac arrest Elevated troponin- flat at 0.06, likely demand ischemia , sinus tachycardia  Plan Off pressors, Lopressor as needed On IV fluids  Acute  encephalopathy.  Highly concerning for anoxic brain injury: Downtime was estimated at  15 minutes prior to review of spontaneous circulation  Neurology has been consulted. And they have ordered MRI for prognostication. EEG once propofol and versed stopped for 12 hours. Continue keppra and valproic acid   Plan -neurology consulted and appreciate recs  Continue supportive care On propofol, Versed as needed, Keppra for myoclonus, and valproic acid  Appreciate neurology recommendation.  Continue to monitor neuro status  Hypokalemia, hypomag Plan Replete lytes- repleted overnight   Hyperglycemia Plan SSI  FAMILY  - Updates: Son, daughter in law and grandson updated at bedside on 1/20. Conveyed our concern that she likely has severe anoxic injury. Discussed with husband. She is now DNR. - Inter-disciplinary family meet or Palliative Care meeting due by: 1/25   Burgess Estelle MD IMTS  [facility] Pager [phone] If no answer or after 3pm call: [phone] 05/20/17, 7:58 AM

## 2017-05-31 DEATH — deceased

## 2017-06-20 ENCOUNTER — Ambulatory Visit: Payer: Medicare Other | Admitting: Internal Medicine

## 2017-08-26 ENCOUNTER — Ambulatory Visit: Payer: Medicare Other | Admitting: Internal Medicine

## 2018-01-13 IMAGING — DX DG CHEST 2V
2 series · 2 of 2 positions shown · non-contrast
Comparison: 03/05/2016.

CLINICAL DATA: Shortness of breath for the past week. History of
asthma and hypertension.

EXAM:
CHEST  2 VIEW

[chest lat]
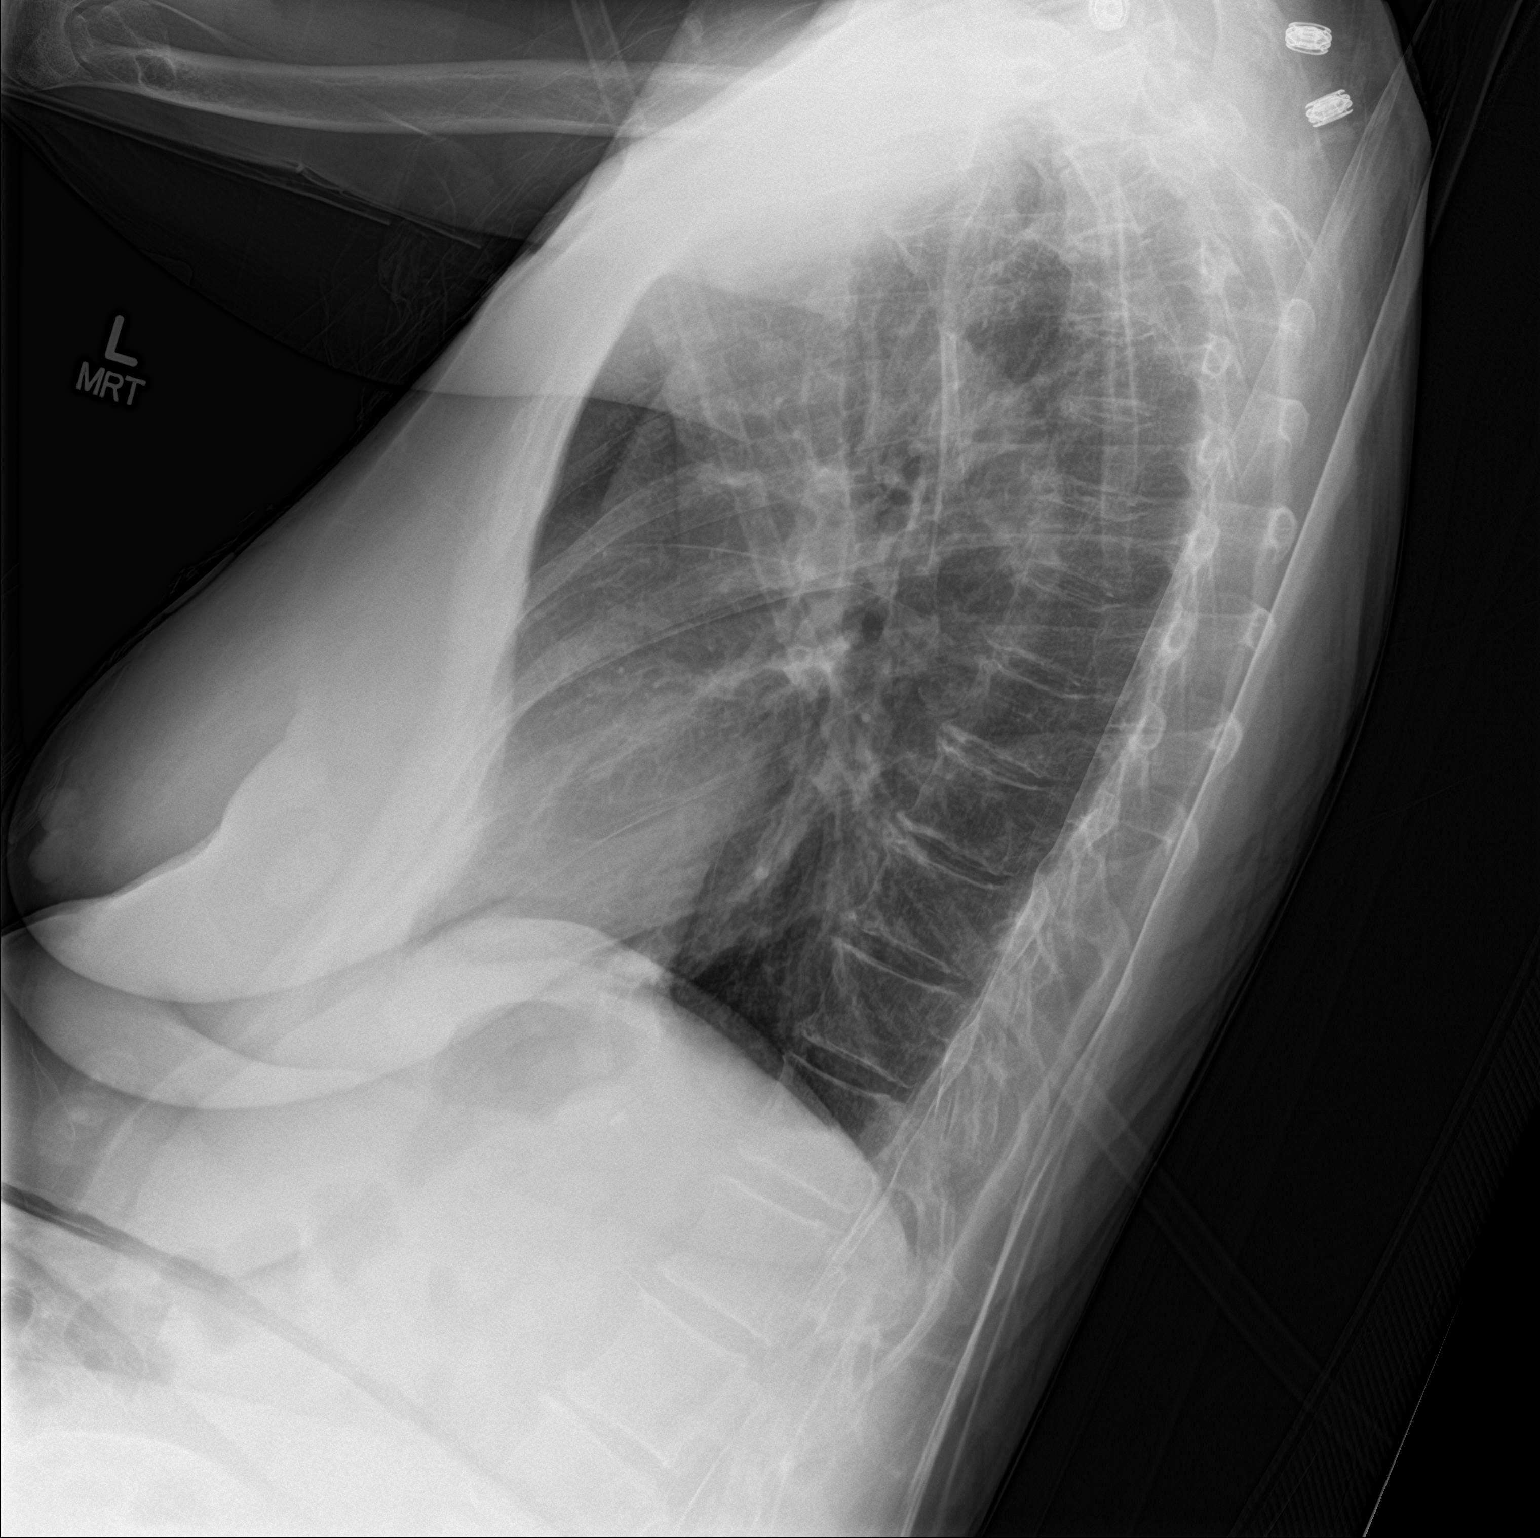

[chest ap]
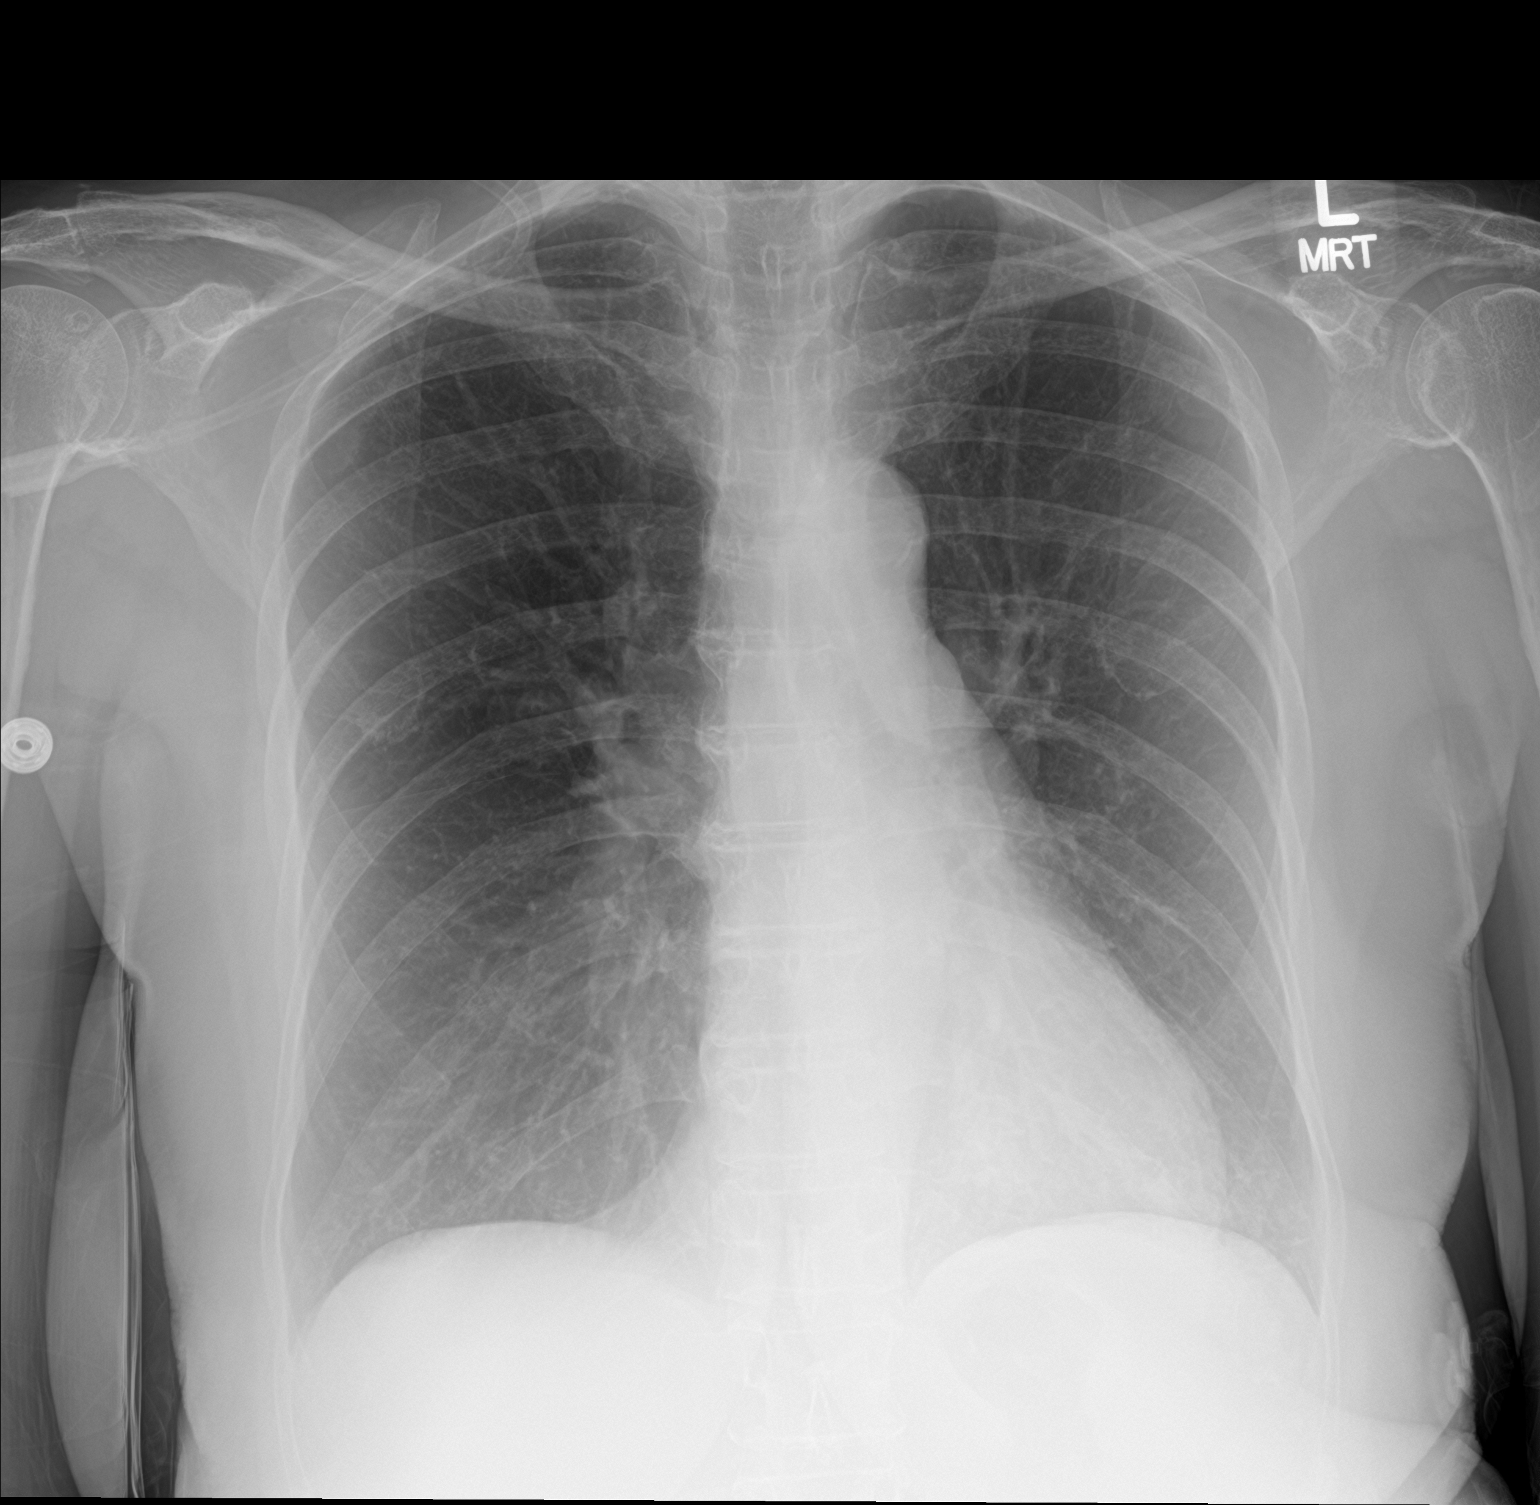

[2 of 2 positions shown; findings below may reference images not displayed]

FINDINGS: Normal sized heart. Clear lungs. The lungs are mildly hyperexpanded.
Aortic arch calcification. Mild thoracic spine degenerative changes.
Small right humeral head cysts. Diffuse osteopenia.
IMPRESSION: No acute abnormality. Mild changes of COPD. Aortic atherosclerosis.

## 2019-01-19 IMAGING — DX DG ABD PORTABLE 1V
2 series · 2 of 2 positions shown · non-contrast
Comparison: Chest radiograph dated 05/17/2017 and PET CT dated
11/23/2010

CLINICAL DATA: 76-year-old female with history of lung mass.
Evaluate for enteric tube positioning.

EXAM:
PORTABLE CHEST 1 VIEW
Portable abdomen one view

[abdomen kub (1 of 2)]
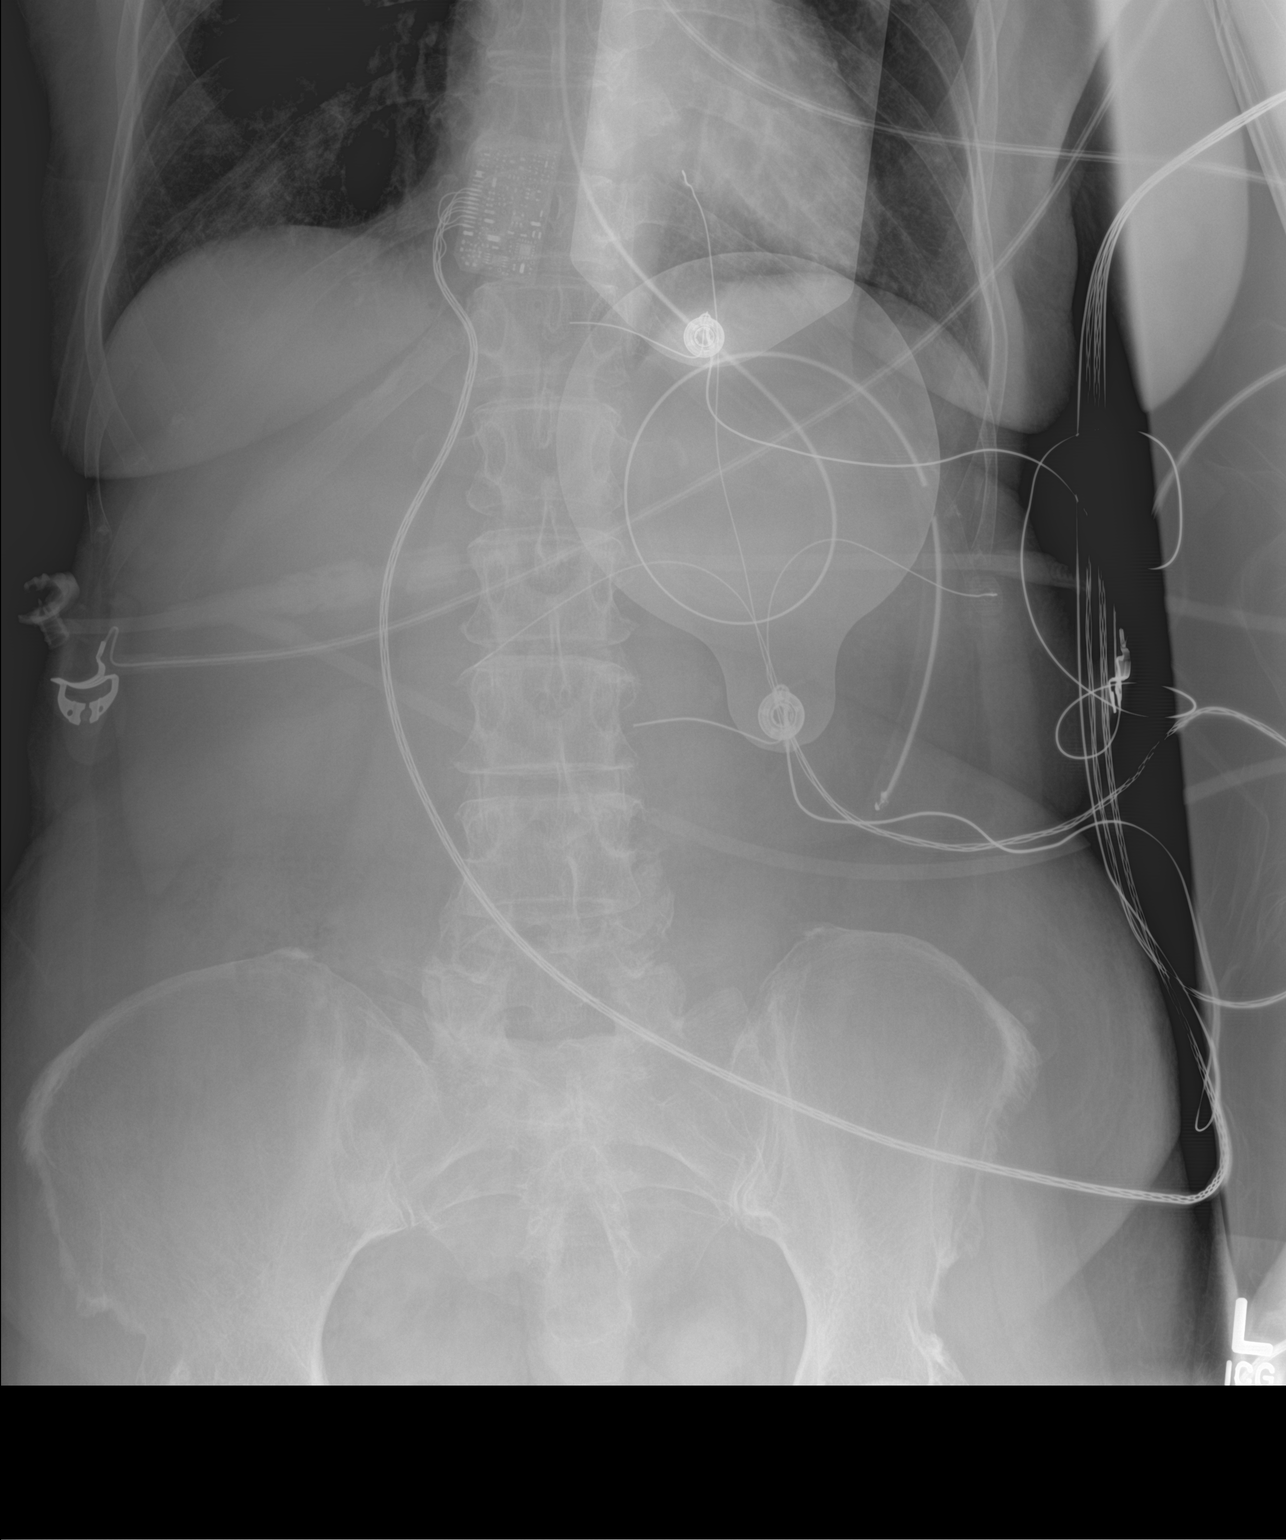

[abdomen kub (2 of 2)]
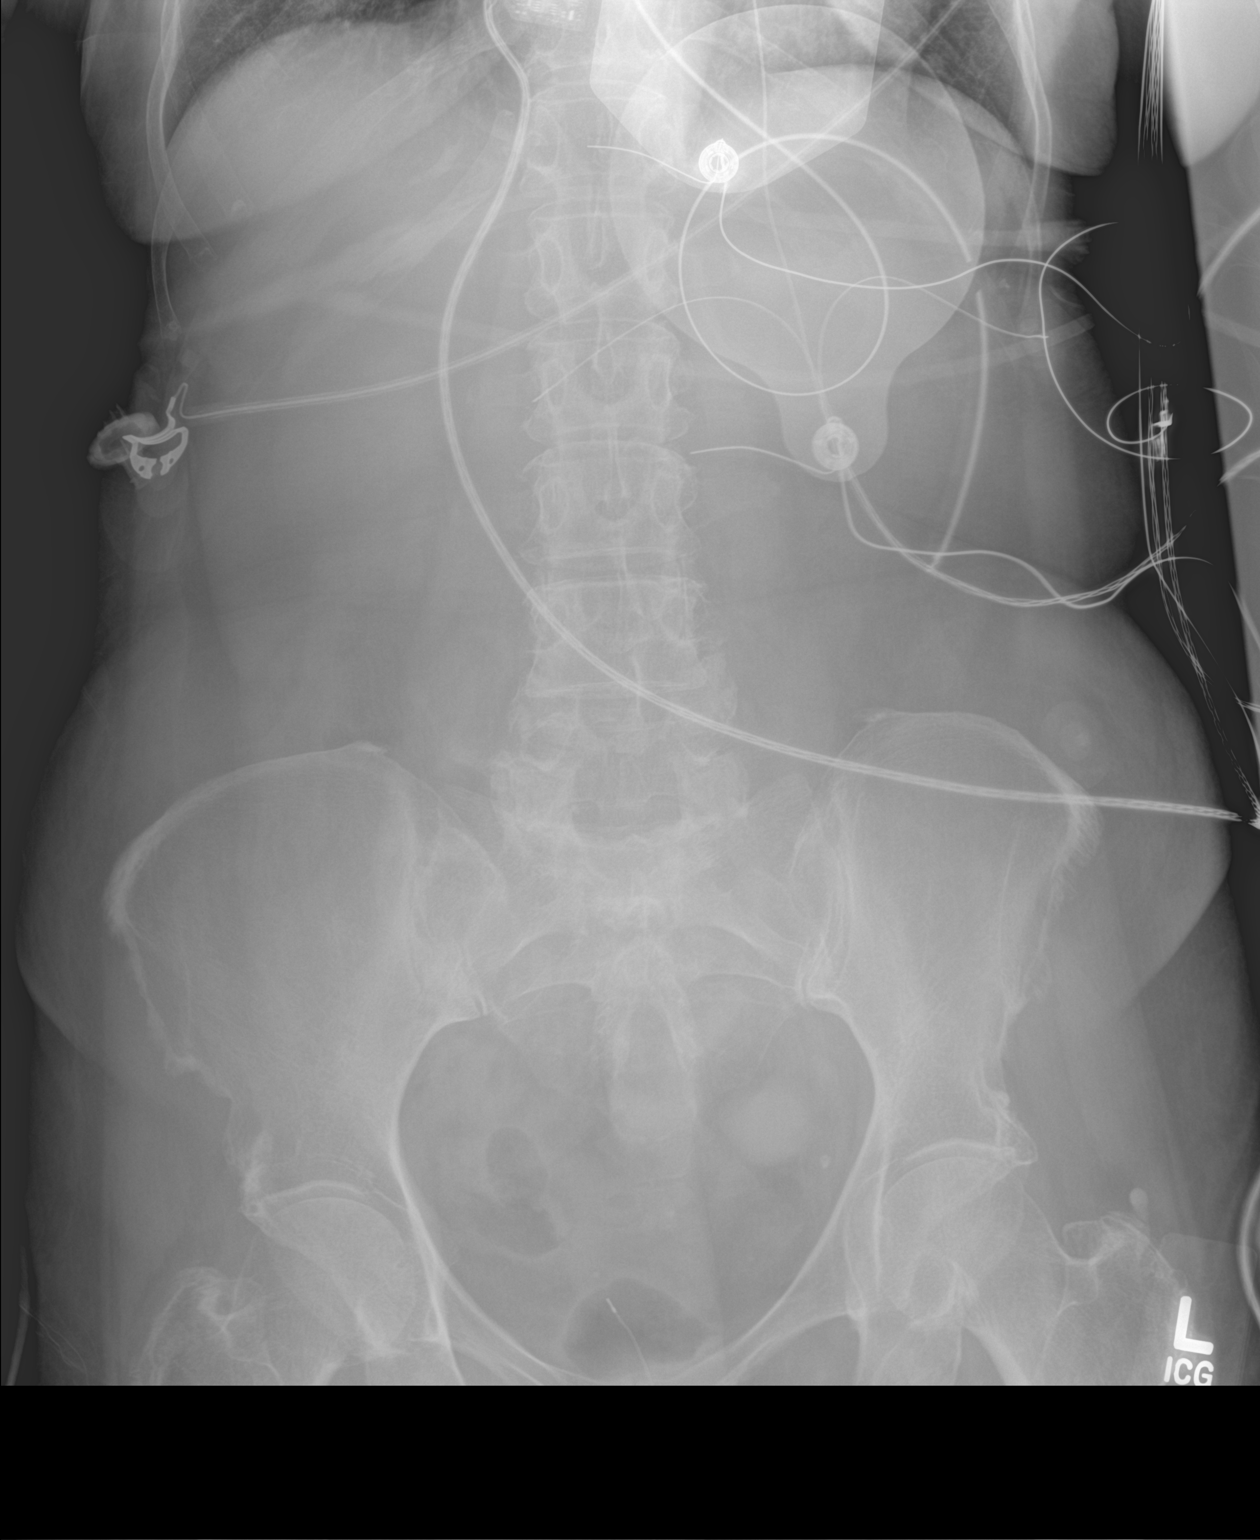

[2 of 2 positions shown; findings below may reference images not displayed]

FINDINGS: Endotracheal tube approximately 3 cm above the carina. The enteric
tube extends into the left hemiabdomen with side-port in the
proximal body and tip in the mid stomach.

Bibasilar and infrahilar densities likely atelectatic changes.
Infiltrate is not excluded. Clinical correlation is recommended.
There is no pleural effusion or pneumothorax. The cardiac silhouette
is within normal limits.

There is paucity of small bowel air. No bowel dilatation or evidence
of obstruction. No free air identified. There is degenerative
changes of the spine and hips. No acute osseous pathology.
IMPRESSION: 1. Endotracheal tube above the carina and enteric tube in the
stomach.
2. Bibasilar and infrahilar atelectasis/infiltrate.
3. No bowel dilatation.

## 2019-01-20 IMAGING — DX DG CHEST 1V PORT
1 series · 1 of 1 positions shown · non-contrast
Comparison: 05/18/2017 and earlier.

CLINICAL DATA: 76-year-old female found down and pulseless.
Resuscitated. Intubated.

EXAM:
PORTABLE CHEST 1 VIEW

[chest ap]
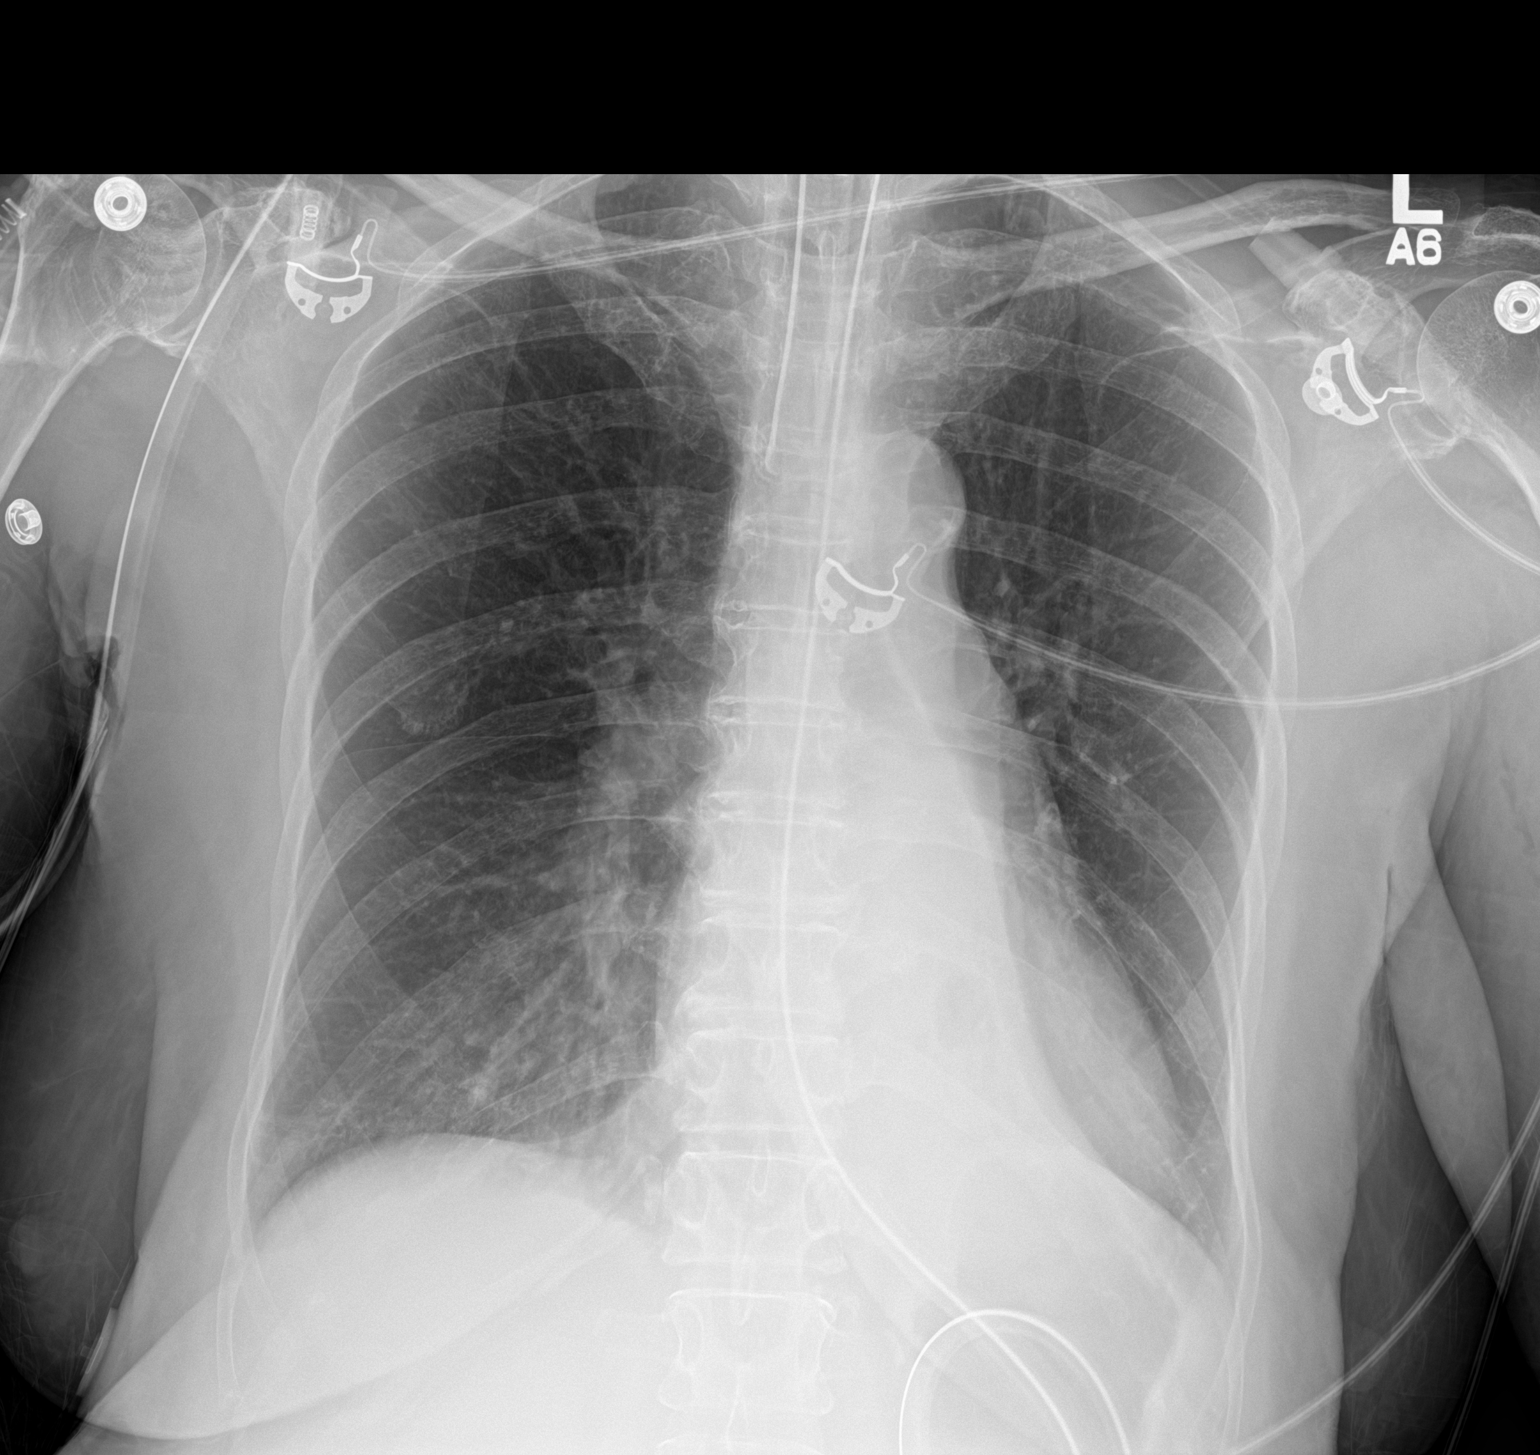

[1 of 1 positions shown; findings below may reference images not displayed]

FINDINGS: Portable AP semi upright view at 0964 hours. Stable endotracheal
tube and visible enteric tube. Underlying large lung volumes.
Interval collapse or consolidation of the left lower lobe. No
pneumothorax or pulmonary edema. Stable right lung ventilation. No
definite effusion. Mediastinal contours remain within normal limits.
IMPRESSION: 1.  Stable lines and tubes.
2. Left lower lobe collapse or consolidation since yesterday.
3. No other acute cardiopulmonary abnormality.
